# Patient Record
Sex: Female | Born: 1970
Health system: Southern US, Community
[De-identification: ages and names within clinical notes are randomized; demographics above are authoritative.]

## PROBLEM LIST (undated history)

## (undated) ENCOUNTER — Emergency Department (HOSPITAL_COMMUNITY): Admission: EM | Payer: BC Managed Care – PPO | Source: Home / Self Care

## (undated) DIAGNOSIS — D689 Coagulation defect, unspecified: Secondary | ICD-10-CM

## (undated) DIAGNOSIS — F32A Depression, unspecified: Secondary | ICD-10-CM

## (undated) DIAGNOSIS — M25561 Pain in right knee: Secondary | ICD-10-CM

## (undated) DIAGNOSIS — M1990 Unspecified osteoarthritis, unspecified site: Secondary | ICD-10-CM

## (undated) DIAGNOSIS — T4145XA Adverse effect of unspecified anesthetic, initial encounter: Secondary | ICD-10-CM

## (undated) DIAGNOSIS — N946 Dysmenorrhea, unspecified: Secondary | ICD-10-CM

## (undated) DIAGNOSIS — E041 Nontoxic single thyroid nodule: Secondary | ICD-10-CM

## (undated) DIAGNOSIS — Z8614 Personal history of Methicillin resistant Staphylococcus aureus infection: Secondary | ICD-10-CM

## (undated) DIAGNOSIS — G629 Polyneuropathy, unspecified: Secondary | ICD-10-CM

## (undated) DIAGNOSIS — F329 Major depressive disorder, single episode, unspecified: Secondary | ICD-10-CM

## (undated) DIAGNOSIS — I10 Essential (primary) hypertension: Secondary | ICD-10-CM

## (undated) DIAGNOSIS — Z803 Family history of malignant neoplasm of breast: Secondary | ICD-10-CM

## (undated) DIAGNOSIS — T7840XA Allergy, unspecified, initial encounter: Secondary | ICD-10-CM

## (undated) DIAGNOSIS — Z98811 Dental restoration status: Secondary | ICD-10-CM

## (undated) DIAGNOSIS — M21372 Foot drop, left foot: Secondary | ICD-10-CM

## (undated) DIAGNOSIS — R51 Headache: Secondary | ICD-10-CM

## (undated) DIAGNOSIS — T8859XA Other complications of anesthesia, initial encounter: Secondary | ICD-10-CM

## (undated) DIAGNOSIS — G709 Myoneural disorder, unspecified: Secondary | ICD-10-CM

## (undated) DIAGNOSIS — I82409 Acute embolism and thrombosis of unspecified deep veins of unspecified lower extremity: Secondary | ICD-10-CM

## (undated) DIAGNOSIS — E119 Type 2 diabetes mellitus without complications: Secondary | ICD-10-CM

## (undated) DIAGNOSIS — M2669 Other specified disorders of temporomandibular joint: Secondary | ICD-10-CM

## (undated) DIAGNOSIS — F419 Anxiety disorder, unspecified: Secondary | ICD-10-CM

## (undated) DIAGNOSIS — Z87898 Personal history of other specified conditions: Secondary | ICD-10-CM

## (undated) DIAGNOSIS — D6852 Prothrombin gene mutation: Secondary | ICD-10-CM

## (undated) HISTORY — PX: HAND SURGERY: SHX662

## (undated) HISTORY — DX: Acute embolism and thrombosis of unspecified deep veins of unspecified lower extremity: I82.409

## (undated) HISTORY — DX: Personal history of other specified conditions: Z87.898

## (undated) HISTORY — DX: Essential (primary) hypertension: I10

## (undated) HISTORY — DX: Coagulation defect, unspecified: D68.9

## (undated) HISTORY — PX: FOOT SURGERY: SHX648

## (undated) HISTORY — DX: Foot drop, left foot: M21.372

## (undated) HISTORY — PX: PELVIC LAPAROSCOPY: SHX162

## (undated) HISTORY — DX: Major depressive disorder, single episode, unspecified: F32.9

## (undated) HISTORY — DX: Anxiety disorder, unspecified: F41.9

## (undated) HISTORY — DX: Type 2 diabetes mellitus without complications: E11.9

## (undated) HISTORY — DX: Pain in right knee: M25.561

## (undated) HISTORY — DX: Depression, unspecified: F32.A

## (undated) HISTORY — PX: TONSILLECTOMY: SUR1361

## (undated) HISTORY — DX: Family history of malignant neoplasm of breast: Z80.3

## (undated) HISTORY — PX: JOINT REPLACEMENT: SHX530

## (undated) HISTORY — DX: Polyneuropathy, unspecified: G62.9

## (undated) HISTORY — PX: CHOLECYSTECTOMY: SHX55

## (undated) HISTORY — DX: Prothrombin gene mutation: D68.52

## (undated) HISTORY — DX: Dysmenorrhea, unspecified: N94.6

## (undated) HISTORY — DX: Allergy, unspecified, initial encounter: T78.40XA

---

## 2003-08-13 HISTORY — PX: WRIST SURGERY: SHX841

## 2010-08-12 DIAGNOSIS — I82409 Acute embolism and thrombosis of unspecified deep veins of unspecified lower extremity: Secondary | ICD-10-CM

## 2010-08-12 HISTORY — DX: Acute embolism and thrombosis of unspecified deep veins of unspecified lower extremity: I82.409

## 2012-09-22 ENCOUNTER — Ambulatory Visit (INDEPENDENT_AMBULATORY_CARE_PROVIDER_SITE_OTHER): Payer: BC Managed Care – PPO | Admitting: Family Medicine

## 2012-09-22 VITALS — BP 134/82 | Ht 68.0 in | Wt 280.0 lb

## 2012-09-22 DIAGNOSIS — IMO0002 Reserved for concepts with insufficient information to code with codable children: Secondary | ICD-10-CM

## 2012-09-22 DIAGNOSIS — S83209A Unspecified tear of unspecified meniscus, current injury, unspecified knee, initial encounter: Secondary | ICD-10-CM

## 2012-09-22 MED ORDER — MELOXICAM 15 MG PO TABS: 15.0000 mg | ORAL_TABLET | Freq: Every day | ORAL | Status: DC

## 2012-09-22 MED ORDER — TRAMADOL HCL 50 MG PO TABS: 50.0000 mg | ORAL_TABLET | Freq: Three times a day (TID) | ORAL | Status: DC | PRN

## 2012-09-22 NOTE — Assessment & Plan Note (Signed)
Patient after verbal and written consent did have an injection into the right knee today. Patient was prepped with alcohol swabs to sterilize area and then had 4 cc of 0.5% Marcaine as well as 1 cc of 80 mg Depo-Medrol injected into the right knee with a 25-gauge 1-1/2 inch needle. Patient tolerated the procedure very well and had improvement immediately.  Patient given home exercise program to do, warned to stop running at this interim and do more low-impact exercises.  Discussed the importance of icing as well.patient was given prescriptions meloxicam and tramadol. Patient will return again in 3-4 weeks for further evaluation.

## 2012-09-22 NOTE — Patient Instructions (Addendum)
Very nice to meet you. I do think you have a meniscal tear. And giving you an injection today and I really hope helps. I'm also sending you in anti-inflammatory. It is called meloxicam. Take one pill daily for the next 10 days then as needed thereafter. Only stop this medication if it hurts her stomach. I'm also giving you a medicine called tramadol that you can take at night. I would like you to try these home exercises I am giving you as well. I should see you again in 3-4 weeks.

## 2012-09-23 NOTE — Progress Notes (Signed)
Chief complaint: Right knee pain  History of present illness: Patient is a very pleasant 42 year old female who has started running over the course of last 4 weeks. Patient states that she's been having more medial knee pain on the right side since she started running. Patient fell on the knee about 2 weeks ago and since that time she has been unable to finish her runs. Patient has a goal of running a half marathon later in the year and was to see what can be done about this. Patient has had this problem of right knee pain previously in 2004 which she under did physical therapy and had a good result. That was more of an injury and she feels that this toe is a little bit different. Patient denies any locking clicking or giving out on her or any radiation of the pain.patient states that the pain is worse with running and can be associated with swelling.  PMH: Depression  Meds  Cymbalta  All: Sulfa  SH: no tobacco, occasional ETOH she works for [employer]  Family history is remarkable for diabetes and hypertension.  Physical exam Blood pressure 134/82, height 5\' 8"  (1.727 m), weight 280 lb (127.007 kg). General: No apparent distress alert and oriented x3 mood and affect normal Respiratory: Patient's speak in full sentences and does not appear short of breath Skin: Warm dry intact with no signs of infection or rash Neuro: Cranial nerves II through XII are intact, neurovascularly intact in all extremities with 2+ DTRs and 2+ pulses. Right knee exam: On inspection patient does have trace effusion. Patient does have near full range of motion but does have pain when going greater than 95 of flexion. Patient is severely tender over the medial joint line. Patient's ligaments are intact but she does have a significant positive McMurray's test. Patient unable to do Thessaly at 30 degrees secondary to pain.she is neurovascularly intact with 2+ reflexes.

## 2012-10-13 ENCOUNTER — Ambulatory Visit (INDEPENDENT_AMBULATORY_CARE_PROVIDER_SITE_OTHER): Payer: BC Managed Care – PPO | Admitting: Family Medicine

## 2012-10-13 VITALS — BP 130/80 | Ht 68.0 in | Wt 280.0 lb

## 2012-10-13 DIAGNOSIS — M25569 Pain in unspecified knee: Secondary | ICD-10-CM

## 2012-10-13 DIAGNOSIS — Z5189 Encounter for other specified aftercare: Secondary | ICD-10-CM

## 2012-10-13 DIAGNOSIS — M25561 Pain in right knee: Secondary | ICD-10-CM

## 2012-10-13 DIAGNOSIS — S83206D Unspecified tear of unspecified meniscus, current injury, right knee, subsequent encounter: Secondary | ICD-10-CM

## 2012-10-13 NOTE — Patient Instructions (Signed)
Good to see you You do have a tear of your medial meniscus on the right knee I want you to go to PT for the next 6 weeks.  I think they will help. I also want you to wear the compression sleeve with activity and one hour afterward.  Lets avoid running still for the next 6 weeks (sorry) OK to bike, swimming, or elliptical.  See you again in 6 weeks.

## 2012-10-13 NOTE — Progress Notes (Signed)
Chief complaint: Right knee pain  History of present illness: The patient is a 42 year old female coming back for followup of her right knee pain. Patient was seen previously in the concern for potential meniscal tear patient was given a corticosteroid injection. Patient has also been doing home exercise program and has noted some improvement. Patient is still states that when she is going down stairs or if she tries to run she still has significant amount of pain. Patient is mostly on the medial aspect of the knee and still can be very sharp sensation. Patient denies any swelling or any radiation. Patient does state from time to time it does seem to be unstable. No new symptoms.  Patient still has the goal of running a half marathon later this year.   Physical Exam Blood pressure 130/80, height 5\' 8"  (1.727 m), weight 280 lb (127.007 kg). General: No apparent distress alert and oriented x3 mood and affect normal Right knee exam: On inspection patient does not have any effusion. Patient does have full range of motion. Patient is still very tender over the medial joint line. All ligaments appeared to be intact but she still has a positive McMurray sign. Patient does have a positive Thessaly's. She is neurovascularly intact distally.  Muculoskeletal ultrasound was performed and interpreted by me today.the patient does have hypotrophic changes and what appears to be a tear of her medial meniscus at the mid substance. This to have significant neovascularization surrounding the area. There is hypoechoic changes surrounding the toe making sure that there is still some fluid. Patient super to pick pouch though does not have significant effusion. All the ligaments appear to be intact without any specific changes.

## 2012-10-13 NOTE — Assessment & Plan Note (Signed)
On ultrasound today patient does have a tear of her medial meniscus. Patient will start formal physical therapy and continue her meloxicam. We will see as long as patient continues to improve slowly with continue with this conservative approach. Patient was given a compression sleeve today which did seem to help. Patient will continue these interventions and then come back and see [place] again in 4-6 weeks' time for further evaluation. She continues to have trouble at that time we need to consider more aggressive therapy.

## 2012-10-19 ENCOUNTER — Ambulatory Visit: Payer: BC Managed Care – PPO | Attending: Sports Medicine

## 2012-10-19 DIAGNOSIS — IMO0001 Reserved for inherently not codable concepts without codable children: Secondary | ICD-10-CM | POA: Insufficient documentation

## 2012-10-19 DIAGNOSIS — M6281 Muscle weakness (generalized): Secondary | ICD-10-CM | POA: Insufficient documentation

## 2012-10-19 DIAGNOSIS — R5381 Other malaise: Secondary | ICD-10-CM | POA: Insufficient documentation

## 2012-10-27 ENCOUNTER — Ambulatory Visit: Payer: BC Managed Care – PPO | Admitting: Physical Therapy

## 2012-11-02 ENCOUNTER — Ambulatory Visit: Payer: BC Managed Care – PPO

## 2012-11-05 ENCOUNTER — Encounter: Payer: BC Managed Care – PPO | Admitting: Physical Therapy

## 2012-11-24 ENCOUNTER — Ambulatory Visit (INDEPENDENT_AMBULATORY_CARE_PROVIDER_SITE_OTHER): Payer: BC Managed Care – PPO | Admitting: Family Medicine

## 2012-11-24 VITALS — BP 136/85 | Ht 68.0 in | Wt 280.0 lb

## 2012-11-24 DIAGNOSIS — Z5189 Encounter for other specified aftercare: Secondary | ICD-10-CM

## 2012-11-24 DIAGNOSIS — M25561 Pain in right knee: Secondary | ICD-10-CM

## 2012-11-24 DIAGNOSIS — S83206D Unspecified tear of unspecified meniscus, current injury, right knee, subsequent encounter: Secondary | ICD-10-CM

## 2012-11-24 DIAGNOSIS — M25569 Pain in unspecified knee: Secondary | ICD-10-CM

## 2012-11-24 NOTE — Assessment & Plan Note (Signed)
At this point patient has not made significant amount of improvement. Patient had been given tramadol before and told her to start taking this again at night. Encourage patient that she can take ibuprofen 600 mg 3 times a day for pain relief. Encourage her to continue to wear the compression sleeve. Patient will be scheduled for an MRI for further evaluation of the meniscal tear. We'll call her with the results and discuss what treatment options are available to her then.

## 2012-11-24 NOTE — Progress Notes (Signed)
Chief complaint: Right knee pain  History of present illness: The patient is here for followup of her right knee pain. Patient has been seen previously and did have a musculoskeletal ultrasound performed that did show that she had a medial meniscal tear. Patient has had a corticosteroid injection greater than 2 months ago did seem to relieve the pain for approximately 1 month. Unfortunately since that time the pain seems to have come back. Patient states that this pain is now waking her up at night. Patient states that is on the medial aspect of the knee and is described as a very sharp sensation with certain movements. Patient denies any swelling, any numbness or any radiation of pain down the leg. Patient does unfortunately unable to do any of her exercises and is affecting all her activities of daily living. At this point she does not even walk the dog. Patient has been using a compression brace that we gave her which does help minimally.  14 system review was done and unremarkable as relates to the orthopedic problem.  Physical exam Blood pressure 136/85, height 5\' 8"  (1.727 m), weight 280 lb (127.007 kg). General: No apparent distress alert and oriented x3 mood and affect normal Respiratory: Patient's speak in full sentences and does not appear short of breath Skin: Warm dry intact with no signs of infection or rash Neuro: Cranial nerves II through XII are intact, neurovascularly intact in all extremities with 2+ DTRs and 2+ pulses. Right knee exam: On inspection there is no gross effusion. Patient does have crepitus with full range of motion. Patient's range of motion is full. She is still severely tender to palpation over the medial joint line. All ligaments are intact. Patient does have a positive McMurray sign. Patient does have a positive Thessaly sign. She is neurovascularly intact distally.

## 2012-11-27 ENCOUNTER — Ambulatory Visit
Admission: RE | Admit: 2012-11-27 | Discharge: 2012-11-27 | Disposition: A | Discharge status: Home / Self Care | Payer: BC Managed Care – PPO | Source: Ambulatory Visit | Attending: Sports Medicine | Admitting: Sports Medicine

## 2012-11-27 DIAGNOSIS — S83206D Unspecified tear of unspecified meniscus, current injury, right knee, subsequent encounter: Secondary | ICD-10-CM

## 2012-11-27 DIAGNOSIS — M25561 Pain in right knee: Secondary | ICD-10-CM

## 2012-12-01 ENCOUNTER — Telehealth: Payer: Self-pay | Admitting: Family Medicine

## 2012-12-01 NOTE — Telephone Encounter (Signed)
Called patient and told her that the MRI does show significant chondromalacia. Patient will make an appointment with an orthopedic surgeon to discuss treatment options including viscous supplementation or arthroscopic procedure. All questions were answered.

## 2012-12-16 ENCOUNTER — Other Ambulatory Visit: Payer: Self-pay | Admitting: Orthopaedic Surgery

## 2012-12-24 ENCOUNTER — Encounter (HOSPITAL_BASED_OUTPATIENT_CLINIC_OR_DEPARTMENT_OTHER): Payer: Self-pay | Admitting: *Deleted

## 2012-12-24 NOTE — H&P (Signed)
[name] is an 42 y.o. female.   Chief Complaint: right knee pain HPI: c/o of many weeks of right knee pain. She has pain at rest and ADL'S. Pain is patella femoral and medial joint line. She has tried nsaid's and cortisone injection with no benefit.  Recent MRI scan is positive for CMP and probably a med. Meniscus tear. She would like to move forward with a right knee scope.  Past Medical History  Diagnosis Date  . Headache     tension or sinus  . Arthritis     right knee  . Depression   . Anxiety   . Medial meniscus tear 12/2012    right knee  . History of MRSA infection prior to 2012    lasted 5-6 yr.  . Dental crowns present   . Jaw snapping     states jaw pops  . Insect bites 12/24/2012    leg  . Complication of anesthesia     has been hard to wake up post-op    Past Surgical History  Procedure Laterality Date  . Cholecystectomy    . Hand surgery Left     X 3 - spider bite and MRSA infection  . Wrist surgery Right   . Tonsillectomy      History reviewed. No pertinent family history. Social History:  reports that she has never smoked. She has never used smokeless tobacco. She reports that  drinks alcohol. She reports that she does not use illicit drugs.  Allergies:  Allergies  Allergen Reactions  . Sulfa Antibiotics Other (See Comments)    HALLUCINATIONS, FEVER, CHILLS    No prescriptions prior to admission    No results found for this or any previous visit (from the past 48 hour(s)). No results found.  Review of Systems  Constitutional: Negative.   HENT: Negative.   Eyes: Negative.   Respiratory: Negative.   Cardiovascular: Negative.   Gastrointestinal: Negative.   Genitourinary: Negative.   Musculoskeletal: Positive for joint pain.  Skin: Negative.   Neurological: Negative.   Endo/Heme/Allergies:       Hx of DVT-coumadin  Psychiatric/Behavioral: Negative.     Height 5\' 8"  (1.727 m), weight 127.007 kg (280 lb). Physical Exam   Constitutional: She is oriented to person, place, and time. She appears well-nourished.  HENT:  Head: Atraumatic.  Eyes: Pupils are equal, round, and reactive to light.  Neck: Normal range of motion.  Cardiovascular: Regular rhythm.   Respiratory: Breath sounds normal.  GI: Soft.  Musculoskeletal:  Right knee- 5-105 rom. 1+crepitation. Pain on med joint line and with motion. + altered gait  Neurological: She is oriented to person, place, and time.  Skin: Skin is dry.  Psychiatric: She has a normal mood and affect.     Assessment/Plan A: Right knee CMP and  A probable medial meniscus tear Plan: We will proceed with arthroscopy of the right knee to stop the pain and improve function. The risks of anesthesia, infection and DVT are reviewed with her. She will contact her PCP for management of her coumadin perioperatively . She most likely will need therapy post op to maximize the results.  Amyre Segundo R 12/24/2012, 12:54 PM

## 2012-12-29 ENCOUNTER — Encounter (HOSPITAL_BASED_OUTPATIENT_CLINIC_OR_DEPARTMENT_OTHER): Payer: Self-pay | Admitting: *Deleted

## 2012-12-29 ENCOUNTER — Encounter (HOSPITAL_BASED_OUTPATIENT_CLINIC_OR_DEPARTMENT_OTHER): Payer: Self-pay | Admitting: Anesthesiology

## 2012-12-29 ENCOUNTER — Ambulatory Visit (HOSPITAL_BASED_OUTPATIENT_CLINIC_OR_DEPARTMENT_OTHER): Payer: BC Managed Care – PPO | Admitting: Anesthesiology

## 2012-12-29 ENCOUNTER — Ambulatory Visit (HOSPITAL_BASED_OUTPATIENT_CLINIC_OR_DEPARTMENT_OTHER)
Admission: RE | Admit: 2012-12-29 | Discharge: 2012-12-29 | Disposition: A | Discharge status: Home / Self Care | Payer: BC Managed Care – PPO | Source: Ambulatory Visit | Attending: Orthopaedic Surgery | Admitting: Orthopaedic Surgery

## 2012-12-29 ENCOUNTER — Encounter (HOSPITAL_BASED_OUTPATIENT_CLINIC_OR_DEPARTMENT_OTHER)
Admission: RE | Disposition: A | Discharge status: Home / Self Care | Payer: Self-pay | Source: Ambulatory Visit | Attending: Orthopaedic Surgery

## 2012-12-29 DIAGNOSIS — F411 Generalized anxiety disorder: Secondary | ICD-10-CM | POA: Insufficient documentation

## 2012-12-29 DIAGNOSIS — F329 Major depressive disorder, single episode, unspecified: Secondary | ICD-10-CM | POA: Insufficient documentation

## 2012-12-29 DIAGNOSIS — Z86718 Personal history of other venous thrombosis and embolism: Secondary | ICD-10-CM | POA: Insufficient documentation

## 2012-12-29 DIAGNOSIS — Z7901 Long term (current) use of anticoagulants: Secondary | ICD-10-CM | POA: Insufficient documentation

## 2012-12-29 DIAGNOSIS — IMO0002 Reserved for concepts with insufficient information to code with codable children: Secondary | ICD-10-CM | POA: Insufficient documentation

## 2012-12-29 DIAGNOSIS — Z8614 Personal history of Methicillin resistant Staphylococcus aureus infection: Secondary | ICD-10-CM | POA: Insufficient documentation

## 2012-12-29 DIAGNOSIS — M129 Arthropathy, unspecified: Secondary | ICD-10-CM | POA: Insufficient documentation

## 2012-12-29 DIAGNOSIS — Z882 Allergy status to sulfonamides status: Secondary | ICD-10-CM | POA: Insufficient documentation

## 2012-12-29 DIAGNOSIS — M2240 Chondromalacia patellae, unspecified knee: Secondary | ICD-10-CM | POA: Insufficient documentation

## 2012-12-29 DIAGNOSIS — S83206D Unspecified tear of unspecified meniscus, current injury, right knee, subsequent encounter: Secondary | ICD-10-CM

## 2012-12-29 DIAGNOSIS — X58XXXA Exposure to other specified factors, initial encounter: Secondary | ICD-10-CM | POA: Insufficient documentation

## 2012-12-29 DIAGNOSIS — F3289 Other specified depressive episodes: Secondary | ICD-10-CM | POA: Insufficient documentation

## 2012-12-29 HISTORY — DX: Dental restoration status: Z98.811

## 2012-12-29 HISTORY — DX: Other specified disorders of temporomandibular joint: M26.69

## 2012-12-29 HISTORY — DX: Other complications of anesthesia, initial encounter: T88.59XA

## 2012-12-29 HISTORY — DX: Headache: R51

## 2012-12-29 HISTORY — DX: Personal history of Methicillin resistant Staphylococcus aureus infection: Z86.14

## 2012-12-29 HISTORY — DX: Depression, unspecified: F32.A

## 2012-12-29 HISTORY — DX: Major depressive disorder, single episode, unspecified: F32.9

## 2012-12-29 HISTORY — DX: Adverse effect of unspecified anesthetic, initial encounter: T41.45XA

## 2012-12-29 HISTORY — DX: Unspecified osteoarthritis, unspecified site: M19.90

## 2012-12-29 HISTORY — PX: KNEE ARTHROSCOPY: SHX127

## 2012-12-29 SURGERY — ARTHROSCOPY, KNEE
Anesthesia: General | Site: Knee | Laterality: Right | Wound class: Clean

## 2012-12-29 MED ORDER — OXYCODONE HCL 5 MG/5ML PO SOLN: 5.0000 mg | Freq: Once | ORAL | Status: AC | PRN

## 2012-12-29 MED ORDER — OXYCODONE HCL 5 MG PO TABS
5.0000 mg | ORAL_TABLET | Freq: Once | ORAL | Status: AC | PRN
  Administered 2012-12-29: 5 mg via ORAL

## 2012-12-29 MED ORDER — CEFAZOLIN SODIUM-DEXTROSE 2-3 GM-% IV SOLR: 2.0000 g | INTRAVENOUS | Status: DC

## 2012-12-29 MED ORDER — LACTATED RINGERS IV SOLN: INTRAVENOUS | Status: DC

## 2012-12-29 MED ORDER — ONDANSETRON HCL 4 MG/2ML IJ SOLN
INTRAMUSCULAR | Status: DC | PRN
  Administered 2012-12-29: 4 mg via INTRAVENOUS

## 2012-12-29 MED ORDER — KETOROLAC TROMETHAMINE 30 MG/ML IJ SOLN
30.0000 mg | Freq: Once | INTRAMUSCULAR | Status: AC
  Administered 2012-12-29: 30 mg via INTRAVENOUS

## 2012-12-29 MED ORDER — MIDAZOLAM HCL 2 MG/2ML IJ SOLN: 1.0000 mg | INTRAMUSCULAR | Status: DC | PRN

## 2012-12-29 MED ORDER — FENTANYL CITRATE 0.05 MG/ML IJ SOLN
INTRAMUSCULAR | Status: DC | PRN
  Administered 2012-12-29 (×2): 25 ug via INTRAVENOUS
  Administered 2012-12-29: 50 ug via INTRAVENOUS
  Administered 2012-12-29: 25 ug via INTRAVENOUS

## 2012-12-29 MED ORDER — SODIUM CHLORIDE 0.9 % IR SOLN
Status: DC | PRN
  Administered 2012-12-29: 6000 mL

## 2012-12-29 MED ORDER — MIDAZOLAM HCL 2 MG/ML PO SYRP: 12.0000 mg | ORAL_SOLUTION | Freq: Once | ORAL | Status: DC | PRN

## 2012-12-29 MED ORDER — DEXAMETHASONE SODIUM PHOSPHATE 4 MG/ML IJ SOLN
INTRAMUSCULAR | Status: DC | PRN
  Administered 2012-12-29: 10 mg via INTRAVENOUS

## 2012-12-29 MED ORDER — HYDROCODONE-ACETAMINOPHEN 5-325 MG PO TABS: 1.0000 | ORAL_TABLET | Freq: Four times a day (QID) | ORAL | Status: DC | PRN

## 2012-12-29 MED ORDER — BUPIVACAINE-EPINEPHRINE 0.5% -1:200000 IJ SOLN
INTRAMUSCULAR | Status: DC | PRN
  Administered 2012-12-29: 20 mL

## 2012-12-29 MED ORDER — DEXTROSE 5 % IV SOLN
3.0000 g | INTRAVENOUS | Status: AC
  Administered 2012-12-29: 2 g via INTRAVENOUS

## 2012-12-29 MED ORDER — CHLORHEXIDINE GLUCONATE 4 % EX LIQD: 60.0000 mL | Freq: Once | CUTANEOUS | Status: DC

## 2012-12-29 MED ORDER — HYDROMORPHONE HCL PF 1 MG/ML IJ SOLN
0.2500 mg | INTRAMUSCULAR | Status: DC | PRN
  Administered 2012-12-29 (×4): 0.5 mg via INTRAVENOUS

## 2012-12-29 MED ORDER — ONDANSETRON HCL 4 MG/2ML IJ SOLN: 4.0000 mg | Freq: Once | INTRAMUSCULAR | Status: DC | PRN

## 2012-12-29 MED ORDER — MIDAZOLAM HCL 5 MG/5ML IJ SOLN
INTRAMUSCULAR | Status: DC | PRN
  Administered 2012-12-29: 2 mg via INTRAVENOUS

## 2012-12-29 MED ORDER — HYDROMORPHONE HCL PF 1 MG/ML IJ SOLN
0.5000 mg | INTRAMUSCULAR | Status: DC | PRN
  Administered 2012-12-29: 0.5 mg via INTRAVENOUS

## 2012-12-29 MED ORDER — PROPOFOL 10 MG/ML IV BOLUS
INTRAVENOUS | Status: DC | PRN
  Administered 2012-12-29: 300 mg via INTRAVENOUS

## 2012-12-29 MED ORDER — LACTATED RINGERS IV SOLN
INTRAVENOUS | Status: DC
  Administered 2012-12-29 (×2): via INTRAVENOUS

## 2012-12-29 MED ORDER — LIDOCAINE HCL (CARDIAC) 20 MG/ML IV SOLN
INTRAVENOUS | Status: DC | PRN
  Administered 2012-12-29: 75 mg via INTRAVENOUS

## 2012-12-29 MED ORDER — FENTANYL CITRATE 0.05 MG/ML IJ SOLN: 50.0000 ug | INTRAMUSCULAR | Status: DC | PRN

## 2012-12-29 SURGICAL SUPPLY — 40 items
BANDAGE ELASTIC 6 VELCRO ST LF (GAUZE/BANDAGES/DRESSINGS) ×2 IMPLANT
BANDAGE GAUZE ELAST BULKY 4 IN (GAUZE/BANDAGES/DRESSINGS) ×2 IMPLANT
BLADE CUDA 5.5 (BLADE) IMPLANT
BLADE GREAT WHITE 4.2 (BLADE) ×2 IMPLANT
CANISTER OMNI JUG 16 LITER (MISCELLANEOUS) ×2 IMPLANT
CANISTER SUCTION 2500CC (MISCELLANEOUS) IMPLANT
DRAPE ARTHROSCOPY W/POUCH 114 (DRAPES) ×2 IMPLANT
DRAPE U-SHAPE 47X51 STRL (DRAPES) ×2 IMPLANT
DRSG EMULSION OIL 3X3 NADH (GAUZE/BANDAGES/DRESSINGS) ×2 IMPLANT
DURAPREP 26ML APPLICATOR (WOUND CARE) ×2 IMPLANT
ELECT MENISCUS 165MM 90D (ELECTRODE) IMPLANT
ELECT REM PT RETURN 9FT ADLT (ELECTROSURGICAL)
ELECTRODE REM PT RTRN 9FT ADLT (ELECTROSURGICAL) IMPLANT
GLOVE BIO SURGEON STRL SZ 6.5 (GLOVE) ×2 IMPLANT
GLOVE BIO SURGEON STRL SZ8.5 (GLOVE) ×2 IMPLANT
GLOVE BIOGEL PI IND STRL 7.0 (GLOVE) ×1 IMPLANT
GLOVE BIOGEL PI IND STRL 8 (GLOVE) ×1 IMPLANT
GLOVE BIOGEL PI IND STRL 8.5 (GLOVE) ×1 IMPLANT
GLOVE BIOGEL PI INDICATOR 7.0 (GLOVE) ×1
GLOVE BIOGEL PI INDICATOR 8 (GLOVE) ×1
GLOVE BIOGEL PI INDICATOR 8.5 (GLOVE) ×1
GLOVE SS BIOGEL STRL SZ 8 (GLOVE) ×1 IMPLANT
GLOVE SUPERSENSE BIOGEL SZ 8 (GLOVE) ×1
GOWN PREVENTION PLUS XLARGE (GOWN DISPOSABLE) ×2 IMPLANT
GOWN PREVENTION PLUS XXLARGE (GOWN DISPOSABLE) ×4 IMPLANT
IV NS IRRIG 3000ML ARTHROMATIC (IV SOLUTION) ×4 IMPLANT
KNEE WRAP E Z 3 GEL PACK (MISCELLANEOUS) ×2 IMPLANT
PACK ARTHROSCOPY DSU (CUSTOM PROCEDURE TRAY) ×2 IMPLANT
PACK BASIN DAY SURGERY FS (CUSTOM PROCEDURE TRAY) ×2 IMPLANT
PENCIL BUTTON HOLSTER BLD 10FT (ELECTRODE) IMPLANT
SET ARTHROSCOPY TUBING (MISCELLANEOUS) ×1
SET ARTHROSCOPY TUBING LN (MISCELLANEOUS) ×1 IMPLANT
SHEET MEDIUM DRAPE 40X70 STRL (DRAPES) ×2 IMPLANT
SPONGE GAUZE 4X4 12PLY (GAUZE/BANDAGES/DRESSINGS) ×2 IMPLANT
SYR 3ML 18GX1 1/2 (SYRINGE) IMPLANT
TOWEL OR 17X24 6PK STRL BLUE (TOWEL DISPOSABLE) ×2 IMPLANT
TOWEL OR NON WOVEN STRL DISP B (DISPOSABLE) ×2 IMPLANT
WAND 30 DEG SABER W/CORD (SURGICAL WAND) IMPLANT
WAND STAR VAC 90 (SURGICAL WAND) IMPLANT
WATER STERILE IRR 1000ML POUR (IV SOLUTION) ×2 IMPLANT

## 2012-12-29 NOTE — Anesthesia Postprocedure Evaluation (Signed)
  Anesthesia Post-op Note  Patient: [name]  Procedure(s) Performed: Procedure(s): RIGHT KNEE ARTHROSCOPY  PARTIAL MEDIAL AND LATERAL MENISECTOMY WITH CHONDROPLASTY (Right)  Patient Location: PACU  Anesthesia Type:General  Level of Consciousness: awake, alert  and oriented  Airway and Oxygen Therapy: Patient Spontanous Breathing and Patient connected to face mask oxygen  Post-op Pain: moderate  Post-op Assessment: Post-op Vital signs reviewed  Post-op Vital Signs: Reviewed  Complications: No apparent anesthesia complications

## 2012-12-29 NOTE — Op Note (Signed)
#  [id number] 

## 2012-12-29 NOTE — Interval H&P Note (Signed)
History and Physical Interval Note:  12/29/2012 10:50 AM  [name]  has presented today for surgery, with the diagnosis of Right Knee Medial Mensical Tear  The various methods of treatment have been discussed with the patient and family. After consideration of risks, benefits and other options for treatment, the patient has consented to  Procedure(s): RIGHT KNEE ARTHROSCOPY  (Right) as a surgical intervention .  The patient's history has been reviewed, patient examined, no change in status, stable for surgery.  I have reviewed the patient's chart and labs.  Questions were answered to the patient's satisfaction.     Treasa Bradshaw G

## 2012-12-29 NOTE — Anesthesia Procedure Notes (Signed)
Procedure Name: LMA Insertion Date/Time: 12/29/2012 11:16 AM Performed by: Zenia Resides D Pre-anesthesia Checklist: Patient identified, Emergency Drugs available, Suction available and Patient being monitored Patient Re-evaluated:Patient Re-evaluated prior to inductionOxygen Delivery Method: Circle System Utilized Preoxygenation: Pre-oxygenation with 100% oxygen Intubation Type: IV induction Ventilation: Mask ventilation without difficulty LMA: LMA inserted LMA Size: 4.0 Number of attempts: 1 Airway Equipment and Method: bite block Placement Confirmation: positive ETCO2 Tube secured with: Tape Dental Injury: Teeth and Oropharynx as per pre-operative assessment

## 2012-12-29 NOTE — Anesthesia Preprocedure Evaluation (Signed)
Anesthesia Evaluation  Patient identified by MRN, date of birth, ID band Patient awake    Reviewed: Allergy & Precautions, H&P , NPO status , Patient's Chart, lab work & pertinent test results  Airway Mallampati: I TM Distance: >3 FB Neck ROM: Full    Dental  (+) Teeth Intact and Dental Advisory Given   Pulmonary  breath sounds clear to auscultation        Cardiovascular Rhythm:Regular Rate:Normal     Neuro/Psych    GI/Hepatic   Endo/Other  Morbid obesity  Renal/GU      Musculoskeletal   Abdominal   Peds  Hematology   Anesthesia Other Findings   Reproductive/Obstetrics                           Anesthesia Physical Anesthesia Plan  ASA: II  Anesthesia Plan: General   Post-op Pain Management:    Induction: Intravenous  Airway Management Planned:   Additional Equipment:   Intra-op Plan:   Post-operative Plan: Extubation in OR  Informed Consent: I have reviewed the patients History and Physical, chart, labs and discussed the procedure including the risks, benefits and alternatives for the proposed anesthesia with the patient or authorized representative who has indicated his/her understanding and acceptance.   Dental advisory given  Plan Discussed with: CRNA, Anesthesiologist and Surgeon  Anesthesia Plan Comments:         Anesthesia Quick Evaluation

## 2012-12-29 NOTE — Transfer of Care (Signed)
Immediate Anesthesia Transfer of Care Note  Patient: [name]  Procedure(s) Performed: Procedure(s): RIGHT KNEE ARTHROSCOPY  PARTIAL MEDIAL AND LATERAL MENISECTOMY WITH CHONDROPLASTY (Right)  Patient Location: PACU  Anesthesia Type:General  Level of Consciousness: awake, alert  and oriented  Airway & Oxygen Therapy: Patient Spontanous Breathing and Patient connected to face mask oxygen  Post-op Assessment: Report given to PACU RN and Post -op Vital signs reviewed and stable  Post vital signs: Reviewed and stable  Complications: No apparent anesthesia complications

## 2012-12-30 ENCOUNTER — Encounter (HOSPITAL_BASED_OUTPATIENT_CLINIC_OR_DEPARTMENT_OTHER): Payer: Self-pay | Admitting: Orthopaedic Surgery

## 2012-12-30 NOTE — Progress Notes (Signed)
Encouraged pt to call Dr. Nolon Nations office if requires assistance with pain management.

## 2012-12-30 NOTE — Op Note (Signed)
NAMELICET, DUNPHY NO.:  [id number]  MEDICAL RECORD NO.:  [id number]  LOCATION:                                 FACILITY:  PHYSICIAN:  Lubertha Basque. Latresha Yahr, M.D.DATE OF BIRTH:  Sep 12, 1970  DATE OF PROCEDURE:  12/29/2012 DATE OF DISCHARGE:                              OPERATIVE REPORT   PREOPERATIVE DIAGNOSES: 1. Right knee torn medial meniscus. 2. Right knee chondromalacia of patella.  POSTOPERATIVE DIAGNOSES: 1. Right knee torn medial and torn lateral meniscus. 2. Right knee chondromalacia of patella.  PROCEDURES: 1. Right knee partial medial and partial lateral meniscectomy. 2. Right knee abrasion chondroplasty, patellofemoral.  ANESTHESIA:  General.  ATTENDING SURGEON:  Lubertha Basque. Jerl Santos, M.D.  ASSISTANT:  Lindwood Qua, P.A.  INDICATION FOR PROCEDURE:  The patient is a 42 year old woman with a long history of right knee pain and swelling.  This is persisted despite multiple conservative measures.  She has pain which limits her ability to rest and walk and she is offered an arthroscopy.  Informed operative consent was obtained after discussion of possible complications including reaction to anesthesia and infection.  SUMMARY OF FINDINGS AND PROCEDURE:  Under general anesthesia, an arthroscopy of the right knee was performed.  Suprapatellar pouch was benign while the patellofemoral joint exhibited grade 3 and focal grade 4 change involving both the apex of the patella and the intertrochlear groove.  Thorough chondroplasties and abrasion to bleeding bone were done.  The medial compartment had a small posterior horn medial meniscus tear addressed with about a 5% partial medial meniscectomy.  She also had some grade 3 change on a broad surface of the medial femoral condyle, and a thorough chondroplasty was done.  The ACL looked intact. The lateral compartment had a posterior horn with lateral meniscus tear, addressed with a 5% partial lateral  meniscectomy.  There were no degenerative changes in the lateral compartment.  She was discharged home the same day.  DESCRIPTION OF PROCEDURE:  The patient was taken to the operating suite where general anesthetic was applied without difficulty.  She was positioned supine and prepped and draped in normal sterile fashion. After the administration of IV Kefzol and appropriate time-out, an arthroscopy of the right knee was performed through total of two portals.  Findings were as noted above and procedure consisted of the partial medial and partial lateral meniscectomies done with baskets and shavers followed by the abrasion chondroplasty, patellofemoral.  The knee was thoroughly irrigated followed by placement of Marcaine with epinephrine and morphine.  Adaptic was placed over the portals followed by dry gauze and loose Ace wrap.  Estimated blood loss and intraoperative fluids obtained from anesthesia records as can accurate.  Plans were for her to go home same-day and follow up in the office next week.  I will contact her by phone tonight.     Lubertha Basque Jerl Santos, M.D.     PGD/MEDQ  D:  12/29/2012  T:  12/30/2012  Job:  [id number]

## 2013-03-26 ENCOUNTER — Other Ambulatory Visit: Payer: Self-pay | Admitting: Family Medicine

## 2013-04-09 ENCOUNTER — Encounter: Payer: Self-pay | Admitting: Family Medicine

## 2013-04-09 ENCOUNTER — Ambulatory Visit (INDEPENDENT_AMBULATORY_CARE_PROVIDER_SITE_OTHER): Payer: BC Managed Care – PPO | Admitting: Family Medicine

## 2013-04-09 VITALS — BP 122/82 | Temp 98.3°F | Ht 67.5 in | Wt 292.0 lb

## 2013-04-09 DIAGNOSIS — F329 Major depressive disorder, single episode, unspecified: Secondary | ICD-10-CM

## 2013-04-09 DIAGNOSIS — J329 Chronic sinusitis, unspecified: Secondary | ICD-10-CM

## 2013-04-09 DIAGNOSIS — G4700 Insomnia, unspecified: Secondary | ICD-10-CM

## 2013-04-09 DIAGNOSIS — E119 Type 2 diabetes mellitus without complications: Secondary | ICD-10-CM

## 2013-04-09 DIAGNOSIS — F341 Dysthymic disorder: Secondary | ICD-10-CM

## 2013-04-09 DIAGNOSIS — Z23 Encounter for immunization: Secondary | ICD-10-CM

## 2013-04-09 DIAGNOSIS — Z975 Presence of (intrauterine) contraceptive device: Secondary | ICD-10-CM

## 2013-04-09 MED ORDER — AMOXICILLIN 875 MG PO TABS: 875.0000 mg | ORAL_TABLET | Freq: Two times a day (BID) | ORAL | Status: DC

## 2013-04-09 NOTE — Progress Notes (Signed)
Chief Complaint  Patient presents with  . Establish Care  . Sinusitis    HPI:  [name] is here to establish care. Insurance changed recently.  Last PCP and physical: 2 years ago with normal pap and mammo.  Has the following chronic problems and concerns today:  Sinusitis: -started: 2 weeks ago -symptoms: nasal congestion, thick, ear pain and pressure, sinus and tooth pain maxillary last few days, drainage, cough -denies: fevers, NVD, SOB, wheezing -has tried: musinex -hx of: sinusitis - remote -has mirena  Depression/Anxiety: -followed by Dr. Sharl Ma whom is prescribing -she is going to try to taper off meds soon -had a tough year last year with daughter undergoing heart surgery/and lost job  Hx of mild diabetes: -after pregnancy, was on medication in the past, then diet control  Patient Active Problem List   Diagnosis Date Noted  . Anxiety and depression, sees Dr. Evelene Croon whom prescripes her psych and sleep medications 04/09/2013  . Insomnia 04/09/2013  . Diabetes 04/09/2013  . IUD contraception - inserted summer 2013 04/09/2013  . Acute meniscal tear of knee 09/22/2012   Health Maintenance: -last pap? -wants flu and tdap vaccines today -last mammo?  ROS: See pertinent positives and negatives per HPI.  Past Medical History  Diagnosis Date  . Headache(784.0)     tension or sinus  . Arthritis     right knee  . Depression   . Anxiety   . Medial meniscus tear 12/2012    right knee  . History of MRSA infection prior to 2012    lasted 5-6 yr.  . Dental crowns present   . Jaw snapping     states jaw pops  . Insect bites 12/24/2012    leg  . Complication of anesthesia     has been hard to wake up post-op  . Gestational diabetes     Family History  Problem Relation Age of Onset  . Diabetes Mother   . Kidney disease Mother   . Hypertension Mother   . Cancer Mother     breast cancer  . Cancer Maternal Grandmother     History   Social History  .  Marital Status: Single    Spouse Name: N/A    Number of Children: N/A  . Years of Education: N/A   Social History Main Topics  . Smoking status: Never Smoker   . Smokeless tobacco: Never Used  . Alcohol Use: Yes     Comment: occasionally; 1-2 drinks a few times per year  . Drug Use: No  . Sexual Activity: None   Other Topics Concern  . 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 , Disp: , Rfl: ;  DULoxetine (CYMBALTA) 60 MG capsule, Take 60 mg by mouth daily. , Disp: , Rfl: ;  meloxicam (MOBIC) 15 MG tablet, Take 15 mg by mouth daily. , Disp: , Rfl: ;  zolpidem (AMBIEN) 10 MG tablet, Take 10 mg by mouth at bedtime as needed. , Disp: , Rfl:  amoxicillin (AMOXIL) 875 MG tablet, Take 1 tablet (875 mg total) by mouth 2 (two) times daily., Disp: 20 tablet,  Rfl: 0  EXAM:  Filed Vitals:   04/09/13 1108  BP: 122/82  Temp: 98.3 F (36.8 C)    Body mass index is 45.03 kg/(m^2).  GENERAL: vitals reviewed and listed above, alert, oriented, appears well hydrated and in no acute distress  HEENT: atraumatic, conjunttiva clear, no obvious abnormalities on inspection of external nose and ears, normal appearance of ear canals and TMs, white nasal congestion, mild post oropharyngeal erythema with PND, no tonsillar edema or exudate, R max sinus TTP  NECK: no obvious masses on inspection  LUNGS: clear to auscultation bilaterally, no wheezes, rales or rhonchi, good air movement  CV: HRRR, no peripheral edema  MS: moves all extremities without noticeable abnormality  PSYCH: pleasant and cooperative, no obvious depression or anxiety  ASSESSMENT AND PLAN:  Discussed the following assessment and plan:  Need for  prophylactic vaccination with combined diphtheria-tetanus-pertussis (DTP) vaccine - Plan: Tdap vaccine greater than or equal to 7yo IM  Need for prophylactic vaccination and inoculation against influenza - Plan: Flu Vaccine QUAD 36+ mos PF IM (Fluarix)  Anxiety and depression  Insomnia  Diabetes  IUD contraception - inserted summer 2013  Sinusitis - Plan: amoxicillin (AMOXIL) 875 MG tablet   -We reviewed the PMH, PSH, FH, SH, Meds and Allergies. -We provided refills for any medications we will prescribe as needed. -We addressed current concerns per orders and patient instructions. -We have asked for records for pertinent exams, studies, vaccines and notes from previous providers. -We have advised patient to follow up per instructions below. -she prefers to do fasting labs at CPE in next few weeks - will do pap, hgbA1c, lipids and bmp then -given strong FH of breast Ca advised yearly mammograms, self breast exams and offered referral for genetic counseling. She wishes to hold of on counseling but will let us know if changes her mind regarding this referral -treating sinusitis with amoxicillin, nasal saline and afrin x3 days - risks and return precautions discussed -flu and tdap vacccines given - risks and information provided -psych is to prescribe all of her psych and sleep meds; advised slowly weaning of benzos and sleep aide when possible and discussed risks -she is to follow up in the next few weeks for labs and CPE    -Patient advised to return or notify a doctor immediately if symptoms worsen or persist or new concerns arise.  Patient Instructions  -PLEASE SIGN UP FOR MYCHART TODAY   We recommend the following healthy lifestyle measures: - eat a healthy diet consisting of lots of vegetables, fruits, beans, nuts, seeds, healthy meats such as white chicken and fish and whole grains.  - avoid fried foods, fast food, processed foods, sodas, red meet and other fattening foods.  -  get a least 150 minutes of aerobic exercise per week.   -schedule your mammogram - call number provided for breast center  -As we discussed, we have prescribed a new medication (amoxicillin) for you at this appointment. We discussed the common and serious potential adverse effects of this medication and you can review these and more with the pharmacist when you pick up your medication.  Please follow the instructions for use carefully and notify us immediately if you have any problems taking this medication.  You were given the flu and tdap vaccines today  Follow up in: the next few weeks for a morning appointment for your physical exam, pap smear and fasting labs - you may drink water or black coffee before the appointment  Colin Benton R.

## 2013-04-09 NOTE — Patient Instructions (Signed)
-  PLEASE SIGN UP FOR MYCHART TODAY   We recommend the following healthy lifestyle measures: - eat a healthy diet consisting of lots of vegetables, fruits, beans, nuts, seeds, healthy meats such as white chicken and fish and whole grains.  - avoid fried foods, fast food, processed foods, sodas, red meet and other fattening foods.  - get a least 150 minutes of aerobic exercise per week.   -schedule your mammogram - call number provided for breast center  -As we discussed, we have prescribed a new medication (amoxicillin) for you at this appointment. We discussed the common and serious potential adverse effects of this medication and you can review these and more with the pharmacist when you pick up your medication.  Please follow the instructions for use carefully and notify us immediately if you have any problems taking this medication.  You were given the flu and tdap vaccines today  Follow up in: the next few weeks for a morning appointment for your physical exam, pap smear and fasting labs - you may drink water or black coffee before the appointment

## 2013-04-23 ENCOUNTER — Encounter: Payer: Self-pay | Admitting: Family Medicine

## 2013-04-23 ENCOUNTER — Other Ambulatory Visit (HOSPITAL_COMMUNITY)
Admission: RE | Admit: 2013-04-23 | Discharge: 2013-04-23 | Disposition: A | Discharge status: Home / Self Care | Payer: BC Managed Care – PPO | Source: Ambulatory Visit | Attending: Family Medicine | Admitting: Family Medicine

## 2013-04-23 ENCOUNTER — Ambulatory Visit (INDEPENDENT_AMBULATORY_CARE_PROVIDER_SITE_OTHER): Payer: BC Managed Care – PPO | Admitting: Family Medicine

## 2013-04-23 ENCOUNTER — Encounter: Payer: Self-pay | Admitting: *Deleted

## 2013-04-23 VITALS — BP 120/80 | Temp 98.1°F | Ht 67.5 in | Wt 291.0 lb

## 2013-04-23 DIAGNOSIS — Z0000 Encounter for general adult medical examination without abnormal findings: Secondary | ICD-10-CM

## 2013-04-23 DIAGNOSIS — D229 Melanocytic nevi, unspecified: Secondary | ICD-10-CM

## 2013-04-23 DIAGNOSIS — E669 Obesity, unspecified: Secondary | ICD-10-CM

## 2013-04-23 DIAGNOSIS — D239 Other benign neoplasm of skin, unspecified: Secondary | ICD-10-CM

## 2013-04-23 DIAGNOSIS — Z01419 Encounter for gynecological examination (general) (routine) without abnormal findings: Secondary | ICD-10-CM | POA: Insufficient documentation

## 2013-04-23 LAB — LIPID PANEL
Cholesterol: 174 mg/dL (ref 0–200)
HDL: 41.7 mg/dL (ref >39.00)
Total CHOL/HDL Ratio: 4
Triglycerides: 68 mg/dL (ref 0.0–149.0)

## 2013-04-23 NOTE — Patient Instructions (Addendum)
-  Vitamin D3 1000 IU; 1200mg  calcium daily from diet and supplement  -schedule mammogram  We recommend the following healthy lifestyle measures: - eat a healthy diet consisting of lots of vegetables, fruits, beans, nuts, seeds, healthy meats such as white chicken and fish and whole grains.  - avoid fried foods, fast food, processed foods, sodas, red meet and other fattening foods.  - get a least 150 minutes of aerobic exercise per week.   -We have ordered labs or studies at this visit including biopsy of skin lesion, pap smear and labs. It can take up to 1-2 weeks for results and processing. We will contact you with instructions IF your results are abnormal. Normal results will be released to your [facility]. If you have not heard from [place] or can not find your results in [facility] in 2 weeks please contact our office.

## 2013-04-23 NOTE — Progress Notes (Addendum)
Chief Complaint  Patient presents with  . Annual Exam    HPI:  Here for CPE:  -Concerns today: none  -Diet: variety of foods, balance and well rounded, larger portion sizes  -Taking folic acid, calcium, vit D: no  -Exercise: walking about 5 days per week  -Diabetes and Dyslipidemia Screening: checking today - fasting  -Hx of HTN: no  -Vaccines: UTD  -pap history: 2 years ago she thinks and normal  -FDLMP: none - on mirena  -sexual activity: yes, female partner, no new partners  -wants STI testing: no  -FH breast, colon or ovarian ca: see FH -reports last mammo 2 years ago - she is going to schedule mammogram  -Alcohol, Tobacco, drug use: see social history  Review of Systems - Review of Systems  Constitutional: Negative for fever and weight loss.  HENT: Negative for hearing loss and ear pain.   Eyes: Negative for blurred vision and double vision.  Respiratory: Negative for shortness of breath.   Cardiovascular: Negative for chest pain and leg swelling.  Gastrointestinal: Negative for abdominal pain, blood in stool and melena.  Genitourinary: Negative for dysuria.  Musculoskeletal: Negative for myalgias, joint pain and falls.  Neurological: Negative for dizziness.  Endo/Heme/Allergies: Does not bruise/bleed easily.  Psychiatric/Behavioral: Negative for depression, suicidal ideas and memory loss.     Past Medical History  Diagnosis Date  . Headache(784.0)     tension or sinus  . Arthritis     right knee  . Depression   . Anxiety   . Medial meniscus tear 12/2012    right knee  . History of MRSA infection prior to 2012    lasted 5-6 yr.  . Dental crowns present   . Jaw snapping     states jaw pops  . Insect bites 12/24/2012    leg  . Complication of anesthesia     has been hard to wake up post-op  . Gestational diabetes     Past Surgical History  Procedure Laterality Date  . Cholecystectomy    . Hand surgery Left     X 3 - spider bite and MRSA  infection  . Wrist surgery Right   . Tonsillectomy    . Knee arthroscopy Right 12/29/2012    Procedure: RIGHT KNEE ARTHROSCOPY  PARTIAL MEDIAL AND LATERAL MENISECTOMY WITH CHONDROPLASTY;  Surgeon: Velna Ochs, MD;  Location: [facility];  Service: Orthopedics;  Laterality: Right;    Family History  Problem Relation Age of Onset  . Diabetes Mother   . Kidney disease Mother   . Hypertension Mother   . Cancer Mother     breast cancer  . Cancer Maternal Grandmother     History   Social History  . Marital Status: Single    Spouse Name: N/A    Number of Children: N/A  . Years of Education: N/A   Social History Main Topics  . Smoking status: Never Smoker   . Smokeless tobacco: Never Used  . Alcohol Use: Yes     Comment: occasionally; 1-2 drinks a few times per year  . Drug Use: No  . Sexual Activity: None   Other Topics Concern  . 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 , Disp: , Rfl: ;  DULoxetine (CYMBALTA) 60 MG capsule, Take 60 mg by mouth daily. , Disp: , Rfl: ;  meloxicam (MOBIC) 15 MG tablet, Take 15 mg by mouth daily. , Disp: , Rfl: ;  zolpidem (AMBIEN) 10 MG tablet, Take 10 mg by mouth at bedtime as needed. , Disp: , Rfl:   EXAM:  Filed Vitals:   04/23/13 0822  BP: 120/80  Temp: 98.1 F (36.7 C)    GENERAL: vitals reviewed and listed below, alert, oriented, appears well hydrated and in no acute distress  HEENT: head atraumatic, PERRLA, normal appearance of eyes, ears, nose and mouth. moist mucus membranes.  NECK: supple, no masses or lymphadenopathy  LUNGS: clear to auscultation bilaterally, no rales, rhonchi or wheeze  CV: HRRR, no peripheral edema or cyanosis,  normal pedal pulses  BREAST: normal appearance - no lesions or discharge, on palpation normal breast tissue without any suspicious masses  ABDOMEN: bowel sounds normal, soft, non tender to palpation, no masses, no rebound or guarding  GU: normal appearance of external genitalia - no lesions or masses, normal vaginal mucosa - no abnormal discharge, normal appearance of cervix - no lesions or abnormal discharge, no masses or tenderness on palpation of uterus and ovaries.  RECTAL: refused  SKIN: no rash, several moles on back and one molle in R upper back 8 cm inferior to top of shoulder and 4 cm lateral to spine is darker then other moles, approx 3.10mm in diameter with slightly irr borders  MS: normal gait, moves all extremities normally  NEURO: CN II-XII grossly intact, normal muscle strength and sensation to light touch on extremities  PSYCH: normal affect, pleasant and cooperative  ASSESSMENT AND PLAN:  Discussed the following assessment and plan:  Visit for preventive health examination - Plan: Lipid Panel, Hemoglobin A1c  Obesity, unspecified - Plan: Lipid Panel, Hemoglobin A1c  Nevus Saucerization: Informed consent obtained after explanation of risks/benefits and other options. Time out. Area cleaned with betadine. Local anesthesia with lidocaine 2% with epi. Saucerization removal of lesion. Hemostasis with drysol. Tolerated well. Specimen sent for path. Wound care instructions provided. Will contact pt with path results.  -Discussed and advised all [place] preventive services health task force level A and B recommendations for age, sex and risks.  -FASTING LABS today  -Pap today  -pt to schedule mammogram  -Advised at least 150 minutes of exercise per week and a healthy diet low in saturated fats and sweets and consisting of fresh fruits and vegetables, lean meats such as fish and white chicken and whole grains.  -labs, studies and vaccines per orders this encounter  Orders  Placed This Encounter  Procedures  . Lipid Panel  . Hemoglobin A1c    Patient Instructions  -Vitamin D3 1000 IU; 1200mg  calcium daily from diet and supplement  -schedule mammogram  We recommend the following healthy lifestyle measures: - eat a healthy diet consisting of lots of vegetables, fruits, beans, nuts, seeds, healthy meats such as white chicken and fish and whole grains.  - avoid fried foods, fast food, processed foods, sodas, red meet and other fattening foods.  - get a least 150 minutes of aerobic exercise per week.   -We have ordered labs or studies at this visit. It can take up to 1-2 weeks for results and processing. We will contact you with instructions IF your results are abnormal. Normal results will  be released to your [facility]. If you have not heard from [place] or can not find your results in [facility] in 2 weeks please contact our office.           Patient advised to return to clinic immediately if symptoms worsen or persist or new concerns.    No Follow-up on file.  Cheryl Basque R.

## 2013-04-23 NOTE — Addendum Note (Signed)
Addended by: Kern Reap B on: 04/23/2013 09:33 AM   Modules accepted: Orders

## 2013-04-27 ENCOUNTER — Ambulatory Visit (INDEPENDENT_AMBULATORY_CARE_PROVIDER_SITE_OTHER): Payer: BC Managed Care – PPO | Admitting: Family Medicine

## 2013-04-27 VITALS — BP 110/80 | Temp 98.2°F | Wt 294.0 lb

## 2013-04-27 DIAGNOSIS — Z23 Encounter for immunization: Secondary | ICD-10-CM

## 2013-04-27 DIAGNOSIS — IMO0001 Reserved for inherently not codable concepts without codable children: Secondary | ICD-10-CM

## 2013-04-27 DIAGNOSIS — E669 Obesity, unspecified: Secondary | ICD-10-CM

## 2013-04-27 DIAGNOSIS — D239 Other benign neoplasm of skin, unspecified: Secondary | ICD-10-CM

## 2013-04-27 DIAGNOSIS — E785 Hyperlipidemia, unspecified: Secondary | ICD-10-CM

## 2013-04-27 DIAGNOSIS — E1165 Type 2 diabetes mellitus with hyperglycemia: Secondary | ICD-10-CM

## 2013-04-27 DIAGNOSIS — D235 Other benign neoplasm of skin of trunk: Secondary | ICD-10-CM

## 2013-04-27 DIAGNOSIS — D225 Melanocytic nevi of trunk: Secondary | ICD-10-CM

## 2013-04-27 LAB — BASIC METABOLIC PANEL
BUN: 14 mg/dL (ref 6–23)
Calcium: 8.8 mg/dL (ref 8.4–10.5)
GFR: 83.32 mL/min (ref >60.00)
Glucose, Bld: 203 mg/dL — ABNORMAL HIGH (ref 70–99)

## 2013-04-27 LAB — MICROALBUMIN / CREATININE URINE RATIO: Microalb Creat Ratio: 1.5 mg/g (ref 0.0–30.0)

## 2013-04-27 MED ORDER — PRAVASTATIN SODIUM 20 MG PO TABS: 20.0000 mg | ORAL_TABLET | Freq: Every day | ORAL | Status: DC

## 2013-04-27 NOTE — Patient Instructions (Addendum)
We recommend the following healthy lifestyle measures: - eat a healthy diet consisting of lots of vegetables, fruits, beans, nuts, seeds, healthy meats such as white chicken and fish and whole grains.  - avoid fried foods, fast food, processed foods, sodas, red meet and other fattening foods.  - get a least 150 minutes of aerobic exercise per week.   -We have ordered labs or studies at this visit. It can take up to 1-2 weeks for results and processing. We will contact you with instructions IF your results are abnormal. Normal results will be released to your [facility]. If you have not heard from [place] or can not find your results in [facility] in 2 weeks please contact our office.   -We placed a referral for you as discussed. It usually takes about 1-2 weeks to process and schedule this referral. If you have not heard from [place] regarding this appointment in 2 weeks please contact our office.  -As we discussed, we have prescribed a new medication for you at this appointment. We discussed the common and serious potential adverse effects of this medication and you can review these and more with the pharmacist when you pick up your medication.  Please follow the instructions for use carefully and notify us immediately if you have any problems taking this medication.  -start the following medications:  -pravastatin 20mg  daily -will call you with labs and then plan to start metformin: start 500mg  once daily for 1 week, then increase to 500mg  twice daily for 1 week, then 1000mg  in the morning and 500mg  in the evening for one week, then goal of 1000mg  twice daily -baby aspirin daily  -check fasting blood sugars and keep a log -goal is under 130 fasting -let us know if any low blood sugars under 70 - eat a snack and recheck   -Please schedule an eye doctor appointment  -Follow up in 3 months

## 2013-04-27 NOTE — Addendum Note (Signed)
Addended by: Terressa Koyanagi on: 04/27/2013 02:06 PM   Modules accepted: Orders

## 2013-04-27 NOTE — Progress Notes (Addendum)
Chief Complaint  Patient presents with  . Blood Sugar Problem    HPI:  Ms. [name] is a new patient to our practice whom was found to have DM on screening lab work.  DM, uncontrolled: -had mild diabetes with pregnancy in the past, was diet controlled Lab Results  Component Value Date   HGBA1C 8.2* 04/23/2013  -diet and exercise: no regular exercise; diet so so -home BS: not monitoring, does not have monitor  HLD: -mild on recent labs  ROS: See pertinent positives and negatives per HPI.  Past Medical History  Diagnosis Date  . Headache(784.0)     tension or sinus  . Arthritis     right knee  . Depression   . Anxiety   . Medial meniscus tear 12/2012    right knee  . History of MRSA infection prior to 2012    lasted 5-6 yr.  . Dental crowns present   . Jaw snapping     states jaw pops  . Insect bites 12/24/2012    leg  . Complication of anesthesia     has been hard to wake up post-op  . Gestational diabetes     Past Surgical History  Procedure Laterality Date  . Cholecystectomy    . Hand surgery Left     X 3 - spider bite and MRSA infection  . Wrist surgery Right   . Tonsillectomy    . Knee arthroscopy Right 12/29/2012    Procedure: RIGHT KNEE ARTHROSCOPY  PARTIAL MEDIAL AND LATERAL MENISECTOMY WITH CHONDROPLASTY;  Surgeon: Velna Ochs, MD;  Location: [facility];  Service: Orthopedics;  Laterality: Right;    Family History  Problem Relation Age of Onset  . Diabetes Mother   . Kidney disease Mother   . Hypertension Mother   . Cancer Mother     breast cancer  . Cancer Maternal Grandmother     History   Social History  . Marital Status: Single    Spouse Name: N/A    Number of Children: N/A  . Years of Education: N/A   Social History Main Topics  . Smoking status: Never Smoker   . Smokeless tobacco: Never Used  . Alcohol Use: Yes     Comment: occasionally; 1-2 drinks a few times per year  . Drug Use: No  . Sexual Activity:  Not on file   Other Topics Concern  . 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 , Disp: , Rfl: ;  DULoxetine (CYMBALTA) 60 MG capsule, Take 60 mg by mouth daily. , Disp: , Rfl: ;  meloxicam (MOBIC) 15 MG tablet, Take 15 mg by mouth daily. , Disp: , Rfl: ;  pravastatin (PRAVACHOL) 20 MG tablet, Take 1 tablet (20 mg total) by mouth daily., Disp: 90 tablet, Rfl: 3 zolpidem (AMBIEN) 10 MG tablet, Take 10 mg by mouth at bedtime as needed. , Disp: , Rfl:   EXAM:  Filed Vitals:   04/27/13 1332  BP: 110/80  Temp: 98.2 F (36.8 C)    Body mass index is 45.34 kg/(m^2).  GENERAL: vitals reviewed and  listed above, alert, oriented, appears well hydrated and in no acute distress  HEENT: atraumatic, conjunttiva clear, no obvious abnormalities on inspection of external nose and ears  NECK: no obvious masses on inspection  LUNGS: clear to auscultation bilaterally, no wheezes, rales or rhonchi, good air movement  CV: HRRR, no peripheral edema  MS: moves all extremities without noticeable abnormality  PSYCH: pleasant and cooperative, no obvious depression or anxiety  ASSESSMENT AND PLAN:  Discussed the following assessment and plan:  Diabetes mellitus type 2, uncontrolled - Plan: Ambulatory referral to Nutrition and Diabetic Education, Hemoglobin A1c, Basic metabolic panel, Microalbumin/Creatinine Ratio, Urine  Obesity, BMI unknown  Other and unspecified hyperlipidemia - Plan: pravastatin (PRAVACHOL) 20 MG tablet  Atypical nevus of back - Plan: Ambulatory referral to Dermatology  Dysplastic nevus - Plan: Ambulatory referral to Dermatology  -repeat hgb1c to confirm, micro alb/cr urine BMP to check renal  function - if ok start metformin (discussed risks and titration) -advised extensively on importance of lifestyle changes -advised nutrition/diabetes educator referral -advised yearly eye exam -advised pneumo vaccine - given -advised starting low dose statin for HLD - risks discussed, discussed asa -foot exam done -follow up in 3 months -Patient advised to return or notify a doctor immediately if symptoms worsen or persist or new concerns arise.  Addendum: -derm path results came back as pt leaving with dysplastic nevus - discussed with pt and she would like to see derm. Path report printed and given to pt to take to derm appt and referral placed.  Patient Instructions  We recommend the following healthy lifestyle measures: - eat a healthy diet consisting of lots of vegetables, fruits, beans, nuts, seeds, healthy meats such as white chicken and fish and whole grains.  - avoid fried foods, fast food, processed foods, sodas, red meet and other fattening foods.  - get a least 150 minutes of aerobic exercise per week.   -We have ordered labs or studies at this visit. It can take up to 1-2 weeks for results and processing. We will contact you with instructions IF your results are abnormal. Normal results will be released to your [facility]. If you have not heard from [place] or can not find your results in [facility] in 2 weeks please contact our office.   -We placed a referral for you as discussed. It usually takes about 1-2 weeks to process and schedule this referral. If you have not heard from [place] regarding this appointment in 2 weeks please contact our office.  -As we discussed, we have prescribed a new medication for you at this appointment. We discussed the common and serious potential adverse effects of this medication and you can review these and more with the pharmacist when you pick up your medication.  Please follow the instructions for use carefully and notify us immediately if you have any problems  taking this medication.  -start the following medications:  -pravastatin 20mg  daily -will call you with labs and then plan to start metformin: start 500mg  once daily for 1 week, then increase to 500mg  twice daily for 1 week, then 1000mg  in the morning and 500mg  in the evening for one week, then goal of 1000mg  twice daily -baby aspirin daily  -check fasting blood sugars and keep a log -goal is under 130 fasting -let us know if any low blood sugars under 70 - eat a snack and recheck   -Please schedule an eye doctor appointment  -Follow up in 3 months  Colin Benton R.

## 2013-04-27 NOTE — Addendum Note (Signed)
Addended by: Azucena Freed on: 04/27/2013 03:31 PM   Modules accepted: Orders

## 2013-04-28 ENCOUNTER — Encounter: Payer: Self-pay | Admitting: Family Medicine

## 2013-04-28 MED ORDER — METFORMIN HCL 500 MG PO TABS: ORAL_TABLET | ORAL | Status: DC

## 2013-04-28 NOTE — Addendum Note (Signed)
Addended by: Azucena Freed on: 04/28/2013 03:42 PM   Modules accepted: Orders

## 2013-04-28 NOTE — Progress Notes (Signed)
Quick Note:  Pt received via MyChart. ______

## 2013-04-28 NOTE — Addendum Note (Signed)
Addended by: Azucena Freed on: 04/28/2013 04:55 PM   Modules accepted: Orders

## 2013-04-29 ENCOUNTER — Other Ambulatory Visit: Payer: Self-pay

## 2013-04-29 DIAGNOSIS — Z1231 Encounter for screening mammogram for malignant neoplasm of breast: Secondary | ICD-10-CM

## 2013-04-29 DIAGNOSIS — Z803 Family history of malignant neoplasm of breast: Secondary | ICD-10-CM

## 2013-04-30 ENCOUNTER — Ambulatory Visit (INDEPENDENT_AMBULATORY_CARE_PROVIDER_SITE_OTHER): Payer: BC Managed Care – PPO | Admitting: Family Medicine

## 2013-04-30 ENCOUNTER — Encounter: Payer: Self-pay | Admitting: Family Medicine

## 2013-04-30 VITALS — BP 130/78 | HR 84 | Wt 292.0 lb

## 2013-04-30 DIAGNOSIS — M9420 Chondromalacia, unspecified site: Secondary | ICD-10-CM

## 2013-04-30 NOTE — Progress Notes (Signed)
  Subjective:    CC: Continued right knee pain   HPI: Patient is a very pleasant 42 year old female who I had the pleasure of meeting at a different office previously. Patient did have what appeared to be an acute meniscal tear on ultrasound. Patient did have a followup MRI that showed she had degenerative changes as well as tricompartmental osteoarthritic changes of the knee. Patient's was not diagnosed with a acute meniscal tear per MRI. Patient has been doing conservative therapy and was not seeming to do any better. Patient went and saw Dr. Jerl Santos who in May and performed a medial meniscectomy as well as chondroplasty. Patient states that she did have some improvement after the surgery but unfortunately over the last month she does seem to be worsening and again. Patient complains mostly of anterior knee pain can shoot around her knee. Patient describes it as a dull aching but can be sharp from time to time. Patient states it is much worse with going downstairs. Patient has been trying to be active and doing some the exercises twice a day it is noticed that starting to swell again as well.  Past medical history, Surgical history, Family history not pertinant except as noted below, Social history, Allergies, and medications have been entered into the medical record, reviewed, and no changes needed.   Review of Systems: No fevers, chills, night sweats, weight loss, chest pain, or shortness of breath.   Objective:   Blood pressure 130/78, pulse 84, weight 292 lb (132.45 kg), SpO2 97.00%.  General: Well Developed, well nourished, and in no acute distress. Obese Neuro: Alert and oriented x3, extra-ocular muscles intact, sensation grossly intact.  HEENT: Normocephalic, atraumatic, pupils equal round reactive to light, neck supple, no masses, no lymphadenopathy, thyroid nonpalpable.  Skin: Warm and dry, no rashes. Cardiac:  no lower extremity edema. Respiratory: Not using accessory muscles,  speaking in full sentences. Abdominal: NT, soft Gait: Nonantlagic, good balance and coordination Lymphatic: no lymphadenopathy in neck or axillae on palpation, non tender.  Musculoskeletal: Inspection and palpation of the right and left upper extremities including the shoulders elbows and wrist are unremarkable with full range of motion and good muscle strength and tone. Inspection and palpation of the right and left lower extremities including the hips  and ankles are unremarkable and nontender with full range of motion and good muscle strength and tone and are symmetric. Knee: Right Normal to inspection with no erythema but trace effusion Palpation normal with no warmth,  But positive medial joint line tenderness, patellar tenderness, or condyle tenderness. ROM full in flexion and extension and lower leg rotation. Ligaments with solid consistent endpoints including ACL, PCL, LCL, MCL. Positive Mcmurray's, Apley's, and Thessalonian tests. Painful patellar compression. Patellar glide with significant crepitus. Patellar and quadriceps tendons unremarkable but does have some weakness of the quadriceps on the right side compared to the contralateral side Hamstring strength is normal Contralateral knee unremarkable.  After informed written and verbal consent, patient was seated on exam table. Right knee was prepped with alcohol swab and utilizing anterolateral approach, patient's right knee space was injected with 4:1  marcaine 0.5%: Kenalog 40mg /dL. Patient tolerated the procedure well without immediate complications.  Impression and Recommendations:

## 2013-04-30 NOTE — Assessment & Plan Note (Signed)
Patient did have a medial meniscectomy and since that time to make improvement but unfortunately is having associated symptoms again. There is a questionable re\re tear of the meniscus. Patient though does have moderate to almost severe osteoarthritic changes which makes her likely not a surgical candidate again for repair. We did try an injection today. Discussed other over-the-counter medications that could be helpful for arthritis pain. We also discussed exercises as well as cycling. The patient was given a brace today which was applied and strapped on by me today. Patient will then come back in 4 weeks. If she actually starts to have more trouble she would be a candidate for viscous supplementation.

## 2013-04-30 NOTE — Patient Instructions (Signed)
It always good to see you Try the injection and the brace.  I think they will help We will see you back in 4 weeks but call if you want [place] to do synvisc or supartz. I will call you about the bike fitting.   Take tylenol 650 mg three times a day is the best evidence based medicine we have for arthritis.  Aleve 1-2 tabs twice a day with food or ibuprofen 600mg  twice daily with food can be added to tylenol.  Glucosamine sulfate 750mg  twice a day is a supplement that has been shown to help moderate to severe arthritis. Capsaicin topically up to four times a day may also help with pain. If cortisone injections do not help, there are different types of shots that may help but they take longer to take effect.  We can discuss this at follow up.  It's important that you continue to stay active. Controlling your weight is important.  Consider physical therapy to strengthen muscles around the joint that hurts to take pressure off of the joint itself. Shoe inserts with good arch support may be helpful. Heat or ice 20 minutes at a time 3-4 times a day as needed to help with pain. Water aerobics and cycling with low resistance are the best two types of exercise for arthritis.

## 2013-05-20 ENCOUNTER — Ambulatory Visit
Admission: RE | Admit: 2013-05-20 | Discharge: 2013-05-20 | Disposition: A | Discharge status: Home / Self Care | Payer: BC Managed Care – PPO | Source: Ambulatory Visit

## 2013-05-20 DIAGNOSIS — Z1231 Encounter for screening mammogram for malignant neoplasm of breast: Secondary | ICD-10-CM

## 2013-05-20 DIAGNOSIS — Z803 Family history of malignant neoplasm of breast: Secondary | ICD-10-CM

## 2013-05-24 ENCOUNTER — Encounter: Payer: Self-pay | Admitting: *Deleted

## 2013-05-24 ENCOUNTER — Encounter: Payer: BC Managed Care – PPO | Attending: Family Medicine | Admitting: *Deleted

## 2013-05-24 ENCOUNTER — Encounter: Payer: Self-pay | Admitting: Family Medicine

## 2013-05-24 ENCOUNTER — Other Ambulatory Visit: Payer: Self-pay | Admitting: Family Medicine

## 2013-05-24 VITALS — Ht 68.0 in | Wt 291.5 lb

## 2013-05-24 DIAGNOSIS — Z713 Dietary counseling and surveillance: Secondary | ICD-10-CM | POA: Insufficient documentation

## 2013-05-24 DIAGNOSIS — E119 Type 2 diabetes mellitus without complications: Secondary | ICD-10-CM | POA: Insufficient documentation

## 2013-05-24 DIAGNOSIS — N6452 Nipple discharge: Secondary | ICD-10-CM

## 2013-05-24 NOTE — Patient Instructions (Addendum)
Goals  Try using a recipe builder for foods you make at home. (ex: [employer])  No meal skipping - try Premier Protein shake to start  Sign up for and attend Bariatric Education Seminar  Call insurance and see if Supervised Weight Loss needed   Bring food journal and glucose meter to next appt  May try biotin to help with hair loss   Diabetes Self-Care Goals  Feet  Daily inspection of feet (look for red/whit spots, signs of infection, blisters, dryness)  Dryness of skin: Bathe daily and use lotion containing lanolin  Dry/Callus Areas: Use pumice stone to remove callus tissue  Diabetic socks (Thick, mixed cotton/acrylic)  Diabetic shoes (Need MD prescription, Check with insurance for coverage)  Eyes  Yearly dilated eye exam  Teeth and Mouth  Brush and Floss Daily  Cleaning/Exam every 6 months  Monitoring Blood Glucose  Fasting glucose (80-120)  Check daily  Pre-Meal (80-120)  2 hrs after the first bit of a meal (80-160)  Check daily for 4 weeks  Bedtime (100-140)  HgbA1C  Check every 3-6 months per MD  Goal <7%  Medical Care American Diabetes Association recommends MD visits every 3-6 months  Exercise  Aerobic exercise (walking, biking, hiking, swimming)   5+ days a week for 30+ minutes  Nutrition  Carbohydrates  Convert to glucose  Consume carbohydrates as a part of meals and snacks  Avoid the use of concentrated sweets as they promote the rapid rise in blood glucose (sweet tea, juice, regular soda, and sweetened beverages)  Choose sugar substitutes for sweetening  Fiber  Slow the uptake of glucose from carbohydrate  Fibrous foods include whole grain breads, cereals, vegetables, beans, peas, and lentils  Use food label to determine foods highest in fiber  Breads should have >2 grams per serving  Cereals should have >3 grams per serving  Sugar  Aim for 0-9 grams per serving  Choose your use of foods containing higher  sugar levels wisely  Counting carbohydrates  Breakfast =  45 grams carbohydrate (3 carb choices)  AM Snack = 15 grams carbohydrate + protein (1 carb choices)  Lunch =  45 grams carbohydrate (3 carb choices)  PM Snack = 15 grams carbohydrate + protein (1 carb choices)  Dinner =  45 grams carbohydrate (3 carb choices)  Bedtime Snack = 15 grams carbohydrate + protein (1 carb choices)  OR   Plate Method for Carbohydrate Control  Use an 8 inch plate  Make 1/2 plate non-starchy vegetables  Make 1/4 plate carbohydrate choice (high fiber)  Make 1/4 plate lean protein  Non-Starchy Vegetables  High in fiber and low in starches/calories  Try to have a large serving a lunch/dinner  Considered a "FREE" choice  High in vitamins, minerals, and anti-oxidants  Deep rich colored vegetables  Proteins  Build and repair tissues and do not contain glucose  Watch for added carbohydrate to protein (such as gravy, breading, sauces)  Choose lean proteins  Bake, broil, grill, and roast proteins,   Serving size = Size of the palm of the hand  Have a protein source at all meals and snacks  Fats  Keep at 1-2 servings per meal  Measure fat servings  Choose reduced fat or low-fat varieties of salad dressings and condiments  Use food label for serving sizes

## 2013-05-24 NOTE — Progress Notes (Addendum)
Medical Nutrition Therapy:  Appt start time: 0800   End time: 0930.  Assessment:  Patient was seen on 05/24/2013 for individual diabetes education.  [name] comes today for education about T2DM. Has h/o GDM in 2004 and reports she has been trying to watch her CHO intake and use the MyPlate method.  Also interested in bariatric surgery and has done extensive research on the RYGB. Discussed RYGB and sleeve gastrectomy procedures as well as potential effects they could have on her DM.  Pt states intent to sign up for and attend bariatric education seminar at [facility]. Has been unable to exercise much d/t knee injury.   Current HbA1c:  Lab Results  Component Value Date   HGBA1C 8.2* 04/27/2013    MEDICATIONS:  See med list. Taking metformin for DM, though not up to full dose yet d/t GI upset. No longer taking Mobic.    DM Self-Mgmt Assessment 05/24/2013  Time since Diabetes Dx Newly diagnosed   Type of Diabetes Type 2  Previous DM Education? Yes  Location of education [place], [place] (2004) for GDM  Work/school missed in year for illness None  Personal health rating (1 = best) 3  How often is MD seen for diabetes? Every 6 months  Hospitalizations in past year 0  Do you check your glucose? No  How often? Daily  Do you exercise? Yes  How often? Walks 2 miles 2x/week  Do you take aspirin? No - but states she was told to by MD  Do you use alcohol? Yes  How often? 1-2 a month  Do you use tobacco? No  Eye exam within a year? Yes  Do you follow a meal plan? No   DIETARY INTAKE:  Usual eating pattern includes 2 meals and 1-3 snacks per day. 24-hr recall:  B (5:30 AM): NONE  Snk (7:30 AM): [place] yogurt (10-15g pro)  Snk (10:30 AM): Banana (med) L (12 PM): Progresso Chicken Tortilla soup (lrgr can), saltines (10 ea), 2 tangerines Snk (2:30-3 PM):  Roasted mixed nuts 1/4-1/3 cup D (6-7 PM): 3 oz baked chicken, non-starchy vegetable, rice or noodles (1/2 cup)  Snk (PM): NONE Beverages:   Coffee (decaf and reg), unsweetened and hot tea, diet soda  Estimated daily energy needs: 1500 calories 170 g carbohydrates 100 g protein 45 g fat (12 g or less as saturated fat)  Progress Towards Goal(s):  In progress.   Nutritional Diagnosis:  Campbell-2.1 Impaired nutrient utilization related to glucose metabolism as evidenced by recent A1c of 8.2% and new diagnosis of T2DM.    Intervention:  Nutrition counseling provided.  Discussed diabetes disease process and treatment options.  Discussed physiology of diabetes and role of obesity on insulin resistance.  Encouraged moderate weight reduction to improve glucose levels.  Discussed role of medications and diet in glucose control  Provided education on macronutrients on glucose levels.  Provided education on carb counting, importance of regularly scheduled meals/snacks, and meal planning  Discussed effects of physical activity on glucose levels and long-term glucose control.  Recommended 150 minutes of physical activity/week.  Reviewed patient medications.  Discussed role of medication on blood glucose and possible side effects  Discussed blood glucose monitoring and interpretation.  Discussed recommended target ranges and individual ranges.    Described short-term complications: hyper- and hypo-glycemia.  Discussed causes,symptoms, and treatment options.  Discussed prevention, detection, and treatment of long-term complications.  Discussed the role of prolonged elevated glucose levels on body systems.  Discussed role of stress on blood  glucose levels and discussed strategies to manage psychosocial issues.  Discussed recommendations for long-term diabetes self-care.  Established checklist for medical, dental, and emotional self-care.  Handouts given during visit include:  Living Well with Diabetes  45g CHO Meal Plan  Bariatric and Wellness Services at [facility] (Program booklet) - link to bariatric seminar registration   Low  CHO Snack List  Meal Planning Card  Samples given during visit include:   PB2: 1 pkt Lot: [id number]; Exp: 08/24/13  Premier Protein shake: 1 bottle Lot: [id number]; Exp: 02/18/14  One Touch Ultra Mini meter:  1 Starter Kit Lot: [id number]; Exp: 01/2014  One Touch Ultra strips: 3 boxes @ 10 strips/box Lot: [id number]; Exp: 01/2014  Barriers to learning/adherance to lifestyle change:  None  Diabetes self-care support plan:   [facility] Diabetes Support Group  Monitoring/Evaluation:  Dietary intake, exercise, A1c, BG trends, and body weight in 4 week(s).

## 2013-05-24 NOTE — Patient Instructions (Signed)
Received notes form [facility] for lestion/spot/mole/growth on right upper back; follow up for skin check in 1 yr.  Sent to Dr. Selena Batten for signature and then to scan box.

## 2013-05-26 ENCOUNTER — Telehealth: Payer: Self-pay | Admitting: Family Medicine

## 2013-05-26 DIAGNOSIS — E119 Type 2 diabetes mellitus without complications: Secondary | ICD-10-CM

## 2013-05-26 LAB — HM DIABETES EYE EXAM

## 2013-05-26 NOTE — Telephone Encounter (Signed)
Pt states she was given a One Touch Ultra glucometer.  However she needs a rx for the lancets and test strips.  Please send a 90 day supply to [facility](tel# [phone]).

## 2013-05-27 MED ORDER — ONETOUCH DELICA LANCETS FINE MISC: 1.0000 | Freq: Every day | Status: DC

## 2013-05-27 MED ORDER — GLUCOSE BLOOD VI STRP: ORAL_STRIP | Status: DC

## 2013-05-27 NOTE — Telephone Encounter (Signed)
Rx sent to pharmacy   

## 2013-05-28 ENCOUNTER — Ambulatory Visit: Payer: BC Managed Care – PPO | Admitting: Family Medicine

## 2013-06-04 ENCOUNTER — Ambulatory Visit (INDEPENDENT_AMBULATORY_CARE_PROVIDER_SITE_OTHER): Payer: BC Managed Care – PPO | Admitting: Family Medicine

## 2013-06-04 ENCOUNTER — Other Ambulatory Visit (INDEPENDENT_AMBULATORY_CARE_PROVIDER_SITE_OTHER): Payer: BC Managed Care – PPO

## 2013-06-04 ENCOUNTER — Ambulatory Visit (INDEPENDENT_AMBULATORY_CARE_PROVIDER_SITE_OTHER)
Admission: RE | Admit: 2013-06-04 | Discharge: 2013-06-04 | Disposition: A | Discharge status: Home / Self Care | Payer: BC Managed Care – PPO | Source: Ambulatory Visit | Attending: Family Medicine | Admitting: Family Medicine

## 2013-06-04 ENCOUNTER — Encounter: Payer: Self-pay | Admitting: Family Medicine

## 2013-06-04 VITALS — BP 132/84 | HR 81 | Wt 290.0 lb

## 2013-06-04 DIAGNOSIS — M25569 Pain in unspecified knee: Secondary | ICD-10-CM

## 2013-06-04 DIAGNOSIS — S8001XA Contusion of right knee, initial encounter: Secondary | ICD-10-CM

## 2013-06-04 DIAGNOSIS — M25561 Pain in right knee: Secondary | ICD-10-CM

## 2013-06-04 DIAGNOSIS — S8010XA Contusion of unspecified lower leg, initial encounter: Secondary | ICD-10-CM

## 2013-06-04 NOTE — Patient Instructions (Signed)
Good to see you No more ladders Continue compression and ice as needed We will get xray Once I have synvisc I will call you and we will get you in.

## 2013-06-04 NOTE — Progress Notes (Signed)
Subjective:    CC: right knee pain follow up  HPI: Patient is a very pleasant 42 year old female returning for continued right knee pain. Patient does have severe chondromalacia with tricompartment degenerative changes of the right knee seen on previous surgery. At last visit patient did have a steroid injection, given further exercises. We discussed over-the-counter medications that could be beneficial for the arthritis. Patient states she was doing very well after the steroid injection but unfortunately fell off a ladder recently. Patient states she fell directly onto this right knee. Patient had some swelling immediately. Patient still has pain that seems to be more on the lateral aspect of the medial aspect where her normal pain is. Patient also states seems to be a little bit below the knee. Very tender to the touch. Patient has been trying compression and Tylenol with minimal improvement. Patient is able to ambulate but states that it is hurtful. Patient denies any radiation of pain, denies any numbness but she states that the leg does feel somewhat weak. Patient denies any redness of the skin and denies any warmth to touch. Unfortunately this was also made her regular pain exacerbated as well. Pain severity 7/10.  Past medical history, Surgical history, Family history not pertinant except as noted below, Social history, Allergies, and medications have been entered into the medical record, reviewed, and no changes needed.   Review of Systems: No fevers, chills, night sweats, weight loss, chest pain, or shortness of breath.   Objective:   Blood pressure 132/84, pulse 81, weight 290 lb (131.543 kg), SpO2 97.00%.  General: Well Developed, well nourished, and in no acute distress. Obese Neuro: Alert and oriented x3, extra-ocular muscles intact, sensation grossly intact.  HEENT: Normocephalic, atraumatic, pupils equal round reactive to light, neck supple, no masses, no lymphadenopathy, thyroid  nonpalpable.  Skin: Warm and dry, no rashes. Cardiac:  no lower extremity edema. Respiratory: Not using accessory muscles, speaking in full sentences. Abdominal: NT, soft Gait: Nonantlagic, good balance and coordination Lymphatic: no lymphadenopathy in neck or axillae on palpation, non tender.  Musculoskeletal: Inspection and palpation of the right and left upper extremities including the shoulders elbows and wrist are unremarkable with full range of motion and good muscle strength and tone. Inspection and palpation of the right and left lower extremities including the hips  and ankles are unremarkable and nontender with full range of motion and good muscle strength and tone and are symmetric. Knee: Right Patient does have trace effusion of the knee in general and does have swelling on the inferior lateral aspect of the most proximal portion of the tibia. In this area patient does have what appears to be a hematoma that is slightly calcified. This is severely tender to palpation. Continue  positive medial joint line tenderness, patellar tenderness, or condyle tenderness. ROM full in flexion and extension and lower leg rotation. Ligaments with solid consistent endpoints including ACL, PCL, LCL, MCL. Positive Mcmurray's, Apley's, and Thessalonian tests. Painful patellar compression. Patellar glide with significant crepitus. Patellar and quadriceps tendons unremarkable but does have some weakness of the quadriceps on the right side compared to the contralateral side Hamstring strength is normal Contralateral knee unremarkable.  Musculoskeletal ultrasound was performed. Interpreted by Antoine Primas, M Limited ultrasound of the proximal tibia does show that patient has what appears to be a fluid collection likely a resolving hematoma. There is speckled heterogeneous aspect that shows there is some calcification occurring. This does appear to be somewhat loculated already. This is also encapsulated  fairly well. Looking at the underlying bone I do not see any true fracture. Patient's patella tendon at the tibial tuberosity is intact.  Impression: Proximal contusion of the tibia and with overlying hematoma. Continued severe osteophytic changes of the knee..  Impression and Recommendations:

## 2013-06-04 NOTE — Assessment & Plan Note (Signed)
What is seen on ultrasound today correspond well with a likely contusion. I will get an x-ray to rule out any type of fracture that is not seen. Especially a tibial plateau fracture I think this is low likelihood the patient been able to weight-bear fairly well. Patient told to attempt to wear the brace, but is giving her pain medications but she declined. Patient is to have some tramadol can take at night as needed. Discuss continuing compression icing. I would like patient to come back once we have prior authorization for Synvisc. Once we have an office we will give her a call.

## 2013-06-24 ENCOUNTER — Ambulatory Visit: Payer: BC Managed Care – PPO | Admitting: Dietician

## 2013-06-24 ENCOUNTER — Encounter: Payer: Self-pay | Admitting: Family Medicine

## 2013-06-24 MED ORDER — HYLAN G-F 20 16 MG/2ML IX SOSY: 1.0000 | PREFILLED_SYRINGE | INTRA_ARTICULAR | Status: DC

## 2013-06-24 NOTE — Telephone Encounter (Signed)
Patient will try viscus supplementation. We will send a prescription to pharmacy. Patient's will pick up the medication if not to expensive. Patient and will follow up again in have injections.

## 2013-06-28 ENCOUNTER — Encounter: Payer: Self-pay | Admitting: Family Medicine

## 2013-06-28 ENCOUNTER — Ambulatory Visit (INDEPENDENT_AMBULATORY_CARE_PROVIDER_SITE_OTHER): Payer: BC Managed Care – PPO | Admitting: Family Medicine

## 2013-06-28 VITALS — BP 120/80 | Temp 98.4°F | Wt 292.0 lb

## 2013-06-28 DIAGNOSIS — E785 Hyperlipidemia, unspecified: Secondary | ICD-10-CM

## 2013-06-28 DIAGNOSIS — E669 Obesity, unspecified: Secondary | ICD-10-CM

## 2013-06-28 NOTE — Progress Notes (Signed)
Pre visit review using our clinic review tool, if applicable. No additional management support is needed unless otherwise documented below in the visit note. 

## 2013-06-28 NOTE — Patient Instructions (Signed)
We recommend the following healthy lifestyle measures: - eat a healthy diet consisting of lots of vegetables, fruits, beans, nuts, seeds, healthy meats such as white chicken and fish and whole grains.  - avoid fried foods, fast food, processed foods, sodas, red meet and other fattening foods.  - get a least 150 minutes of aerobic exercise per week.   -We placed a referral for you as discussed. It usually takes about 1-2 weeks to process and schedule this referral. If you have not heard from [place] regarding this appointment in 2 weeks please contact our office.  Continue to monitor home blood sugars in the meantime and take log to appointment with diabetes specialist

## 2013-06-28 NOTE — Progress Notes (Signed)
Chief Complaint  Patient presents with  . follow up diabetes    HPI:  Follow up:  DM: -home BS: 150-170s Lab Results  Component Value Date   HGBA1C 8.2* 04/27/2013  -lifestyle: has made dramatic dietary changes -meds: metformin - advised to titrate; made her too sick - nausea vomiting, upset stomach, diarrhea - she stopped i a few weeks ago -eye exam: had eye exam  HLD: -mild -started on pravachol last vist Lab Results  Component Value Date   CHOL 174 04/23/2013   HDL 41.70 04/23/2013   LDLCALC 119* 04/23/2013   TRIG 68.0 04/23/2013   CHOLHDL 4 04/23/2013   Obesity: -working on lifestyle -considering bariatric surgery  ROS: See pertinent positives and negatives per HPI.  Past Medical History  Diagnosis Date  . Headache(784.0)     tension or sinus  . Arthritis     right knee  . Depression   . Anxiety   . Medial meniscus tear 12/2012    right knee  . History of MRSA infection prior to 2012    lasted 5-6 yr.  . Dental crowns present   . Jaw snapping     states jaw pops  . Insect bites 12/24/2012    leg  . Complication of anesthesia     has been hard to wake up post-op  . Gestational diabetes 2004  . T2DM (type 2 diabetes mellitus) 05/2013    Past Surgical History  Procedure Laterality Date  . Cholecystectomy    . Hand surgery Left     X 3 - spider bite and MRSA infection  . Wrist surgery Right   . Tonsillectomy    . Knee arthroscopy Right 12/29/2012    Procedure: RIGHT KNEE ARTHROSCOPY  PARTIAL MEDIAL AND LATERAL MENISECTOMY WITH CHONDROPLASTY;  Surgeon: Velna Ochs, MD;  Location: [facility];  Service: Orthopedics;  Laterality: Right;    Family History  Problem Relation Age of Onset  . Diabetes Mother   . Kidney disease Mother   . Hypertension Mother   . Cancer Mother     breast cancer  . Cancer Maternal Grandmother   . Diabetes Maternal Grandmother     History   Social History  . Marital Status: Single    Spouse Name: N/A     Number of Children: N/A  . Years of Education: N/A   Social History Main Topics  . Smoking status: Never Smoker   . Smokeless tobacco: Never Used  . Alcohol Use: Yes     Comment: 1-2 glasses of wine a few times per month  . Drug Use: No  . Sexual Activity: None   Other Topics Concern  . 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 , Disp: , Rfl: ;  DULoxetine (CYMBALTA) 60 MG capsule, Take 60 mg by mouth daily. , Disp: , Rfl: ;  glucose blood (ONE TOUCH ULTRA TEST) test strip, Test daily., Disp: 100 each, Rfl: 12;  Hylan 16 MG/2ML SOSY, Inject 1 Syringe into the articular space once a week., Disp: 3 Syringe, Rfl: 0 ONETOUCH DELICA LANCETS FINE MISC, 1  Device by Does not apply route daily., Disp: 100 each, Rfl: 12;  pravastatin (PRAVACHOL) 20 MG tablet, Take 1 tablet (20 mg total) by mouth daily., Disp: 90 tablet, Rfl: 3;  zolpidem (AMBIEN) 10 MG tablet, Take 10 mg by mouth at bedtime as needed. , Disp: , Rfl:  metFORMIN (GLUCOPHAGE) 500 MG tablet, Take 500 mg in the morning x 1 week, take 500 mg twice daily x 1 week, take 1000 mg in the morning and 500 mg in the evening x 1 week, and then take 1000 mg twice daily., Disp: 120 tablet, Rfl: 3  EXAM:  Filed Vitals:   06/28/13 1502  BP: 120/80  Temp: 98.4 F (36.9 C)    Body mass index is 44.41 kg/(m^2).  GENERAL: vitals reviewed and listed above, alert, oriented, appears well hydrated and in no acute distress  HEENT: atraumatic, conjunttiva clear, no obvious abnormalities on inspection of external nose and ears  NECK: no obvious masses on inspection  LUNGS: clear to auscultation bilaterally, no wheezes, rales or rhonchi, good air movement  CV:  HRRR, no peripheral edema  MS: moves all extremities without noticeable abnormality  PSYCH: pleasant and cooperative, no obvious depression or anxiety  ASSESSMENT AND PLAN:  Discussed the following assessment and plan:  Type II or unspecified type diabetes mellitus without mention of complication, uncontrolled - Plan: Ambulatory referral to Endocrinology  Other and unspecified hyperlipidemia  Obesity, BMI unknown  -discussed various options for her diabetes - seems to be improving some with lifestyle changes and she is committed to lifestyle changes and leary about medications after reaction to metformin -we discussed options including insulin and other medications and she would like to see endocrine for advice prior to starting another pharmacological intervention - referral placed -she is considering bariatric surgery and we discussed this as well -follow up in 3-4 months, sooner if needed -Patient advised to return or notify a doctor immediately if symptoms worsen or persist or new concerns arise.  Patient Instructions  We recommend the following healthy lifestyle measures: - eat a healthy diet consisting of lots of vegetables, fruits, beans, nuts, seeds, healthy meats such as white chicken and fish and whole grains.  - avoid fried foods, fast food, processed foods, sodas, red meet and other fattening foods.  - get a least 150 minutes of aerobic exercise per week.   -We placed a referral for you as discussed. It usually takes about 1-2 weeks to process and schedule this referral. If you have not heard from [place] regarding this appointment in 2 weeks please contact our office.  Continue to monitor home blood sugars in the meantime and take log to appointment with diabetes specialist     Kriste Basque R.

## 2013-06-29 ENCOUNTER — Ambulatory Visit
Admission: RE | Admit: 2013-06-29 | Discharge: 2013-06-29 | Disposition: A | Discharge status: Home / Self Care | Payer: BC Managed Care – PPO | Source: Ambulatory Visit | Attending: Family Medicine | Admitting: Family Medicine

## 2013-06-29 DIAGNOSIS — N6452 Nipple discharge: Secondary | ICD-10-CM

## 2013-06-30 ENCOUNTER — Encounter (INDEPENDENT_AMBULATORY_CARE_PROVIDER_SITE_OTHER): Payer: Self-pay | Admitting: General Surgery

## 2013-06-30 ENCOUNTER — Ambulatory Visit: Payer: BC Managed Care – PPO | Admitting: Family Medicine

## 2013-06-30 ENCOUNTER — Ambulatory Visit (INDEPENDENT_AMBULATORY_CARE_PROVIDER_SITE_OTHER): Payer: BC Managed Care – PPO | Admitting: General Surgery

## 2013-06-30 VITALS — BP 132/88 | HR 66 | Temp 97.1°F | Resp 16 | Ht 68.0 in | Wt 291.0 lb

## 2013-06-30 NOTE — Patient Instructions (Signed)
We will start the work up please call with any questions or email me

## 2013-07-06 ENCOUNTER — Ambulatory Visit (INDEPENDENT_AMBULATORY_CARE_PROVIDER_SITE_OTHER): Payer: BC Managed Care – PPO | Admitting: Family Medicine

## 2013-07-06 ENCOUNTER — Encounter: Payer: Self-pay | Admitting: Family Medicine

## 2013-07-06 ENCOUNTER — Encounter (INDEPENDENT_AMBULATORY_CARE_PROVIDER_SITE_OTHER): Payer: Self-pay | Admitting: General Surgery

## 2013-07-06 VITALS — BP 132/84 | HR 81 | Wt 292.0 lb

## 2013-07-06 DIAGNOSIS — M1710 Unilateral primary osteoarthritis, unspecified knee: Secondary | ICD-10-CM | POA: Insufficient documentation

## 2013-07-06 DIAGNOSIS — M1711 Unilateral primary osteoarthritis, right knee: Secondary | ICD-10-CM

## 2013-07-06 NOTE — Progress Notes (Signed)
CC: right knee pain follow up  HPI: Patient is a very pleasant 42 year old female returning for continued right knee pain. Patient does have severe chondromalacia with tricompartment degenerative changes of the right knee seen on previous surgery. Patient has had injections of cortical steroid without any improvement. Patient is here for Synvisc first injection. Patient continues to do the exercises and wears the brace is sometimes. Patient denies any new symptoms.  Past medical history, Surgical history, Family history not pertinant except as noted below, Social history, Allergies, and medications have been entered into the medical record, reviewed, and no changes needed.   Review of Systems: No fevers, chills, night sweats, weight loss, chest pain, or shortness of breath.   Objective:   Blood pressure 132/84, pulse 81, weight 292 lb (132.45 kg), SpO2 99.00%.   General: Well Developed, well nourished, and in no acute distress. Obese Neuro: Alert and oriented x3, extra-ocular muscles intact, sensation grossly intact.  HEENT: Normocephalic, atraumatic, pupils equal round reactive to light, neck supple, no masses, no lymphadenopathy, thyroid nonpalpable.  Skin: Warm and dry, no rashes. Cardiac:  no lower extremity edema. Respiratory: Not using accessory muscles, speaking in full sentences. Abdominal: NT, soft Gait: Nonantlagic, good balance and coordination Lymphatic: no lymphadenopathy in neck or axillae on palpation, non tender.  Musculoskeletal: Inspection and palpation of the right and left upper extremities including the shoulders elbows and wrist are unremarkable with full range of motion and good muscle strength and tone. Inspection and palpation of the right and left lower extremities including the hips  and ankles are unremarkable and nontender with full range of motion and good muscle strength and tone and are symmetric. Knee: Right Patient does have trace effusion of the knee in  general and does have swelling on the inferior lateral aspect of the most proximal portion of the tibia. In this area patient does have what appears to be a hematoma that is slightly calcified. This is severely tender to palpation. Continue  positive medial joint line tenderness, patellar tenderness, or condyle tenderness. ROM full in flexion and extension and lower leg rotation. Ligaments with solid consistent endpoints including ACL, PCL, LCL, MCL. Positive Mcmurray's, Apley's, and Thessalonian tests. Painful patellar compression. Patellar glide with significant crepitus. Patellar and quadriceps tendons unremarkable but does have some weakness of the quadriceps on the right side compared to the contralateral side Hamstring strength is normal Contralateral knee unremarkable.  Procedure: 16 mg/2.5 mL of Synvisc (sodium hyaluronate) in a prefilled syringe was injected easily into the knee through a 22-gauge needle.

## 2013-07-06 NOTE — Patient Instructions (Signed)
See you in 1 week

## 2013-07-06 NOTE — Assessment & Plan Note (Signed)
First injection given today. Patient will come back in one week for further injection. In addition to this patient will continue over-the-counter medications including vitamin D. Patient should be wearing the brace with activity. Patient will try to do icing 20 minutes twice a day.

## 2013-07-06 NOTE — Progress Notes (Signed)
Patient ID: [name], female   DOB: March 16, 1971, 42 y.o.   MRN: [id number]  Chief Complaint  Patient presents with  . Bariatric Pre-op    HPI [name] is a 42 y.o. female.   HPI 42 year old morbidly obese female referred by Dr.Hannah Selena Batten for evaluation of weight loss surgery. The patient's primary goal is to improve and ultimately eliminate her diabetes mellitus. She is more interested in the laparoscopic Roux-en-Y gastric bypass Or sleeve gastrectomy. She believes the laparoscopic adjustable gastric band requires too much maintenance. Despite numerous attempts for sustained weight loss she has been unsuccessful. She has tried Nutrisystem, [employer], and Slim fast all without any long-term success.  Her main comorbidities include dyslipidemia, diabetes mellitus, Musculoskeletal disease Past Medical History  Diagnosis Date  . Headache(784.0)     tension or sinus  . Arthritis     right knee  . Depression   . Anxiety   . Medial meniscus tear 12/2012    right knee  . History of MRSA infection prior to 2012    lasted 5-6 yr.  . Dental crowns present   . Jaw snapping     states jaw pops  . Insect bites 12/24/2012    leg  . Complication of anesthesia     has been hard to wake up post-op  . Gestational diabetes 2004  . T2DM (type 2 diabetes mellitus) 05/2013  . DVT of lower extremity (deep venous thrombosis) 2012    LLE    Past Surgical History  Procedure Laterality Date  . Cholecystectomy    . Hand surgery Left     X 3 - spider bite and MRSA infection  . Wrist surgery Right   . Tonsillectomy    . Knee arthroscopy Right 12/29/2012    Procedure: RIGHT KNEE ARTHROSCOPY  PARTIAL MEDIAL AND LATERAL MENISECTOMY WITH CHONDROPLASTY;  Surgeon: Velna Ochs, MD;  Location: [facility];  Service: Orthopedics;  Laterality: Right;    Family History  Problem Relation Age of Onset  . Diabetes Mother   . Kidney disease Mother   . Hypertension Mother   .  Cancer Mother     breast cancer  . Cancer Maternal Grandmother   . Diabetes Maternal Grandmother     Social History History  Substance Use Topics  . Smoking status: Never Smoker   . Smokeless tobacco: Never Used  . Alcohol Use: Yes     Comment: 1-2 glasses of wine a few times per month    Allergies  Allergen Reactions  . Sulfa Antibiotics Other (See Comments)    HALLUCINATIONS, FEVER, CHILLS    Current Outpatient Prescriptions  Medication Sig Dispense Refill  . alprazolam (XANAX) 2 MG tablet Take 2 mg by mouth 2 (two) times daily as needed.       . DULoxetine (CYMBALTA) 60 MG capsule Take 60 mg by mouth daily.       Marland Kitchen glucose blood (ONE TOUCH ULTRA TEST) test strip Test daily.  100 each  12  . ONETOUCH DELICA LANCETS FINE MISC 1 Device by Does not apply route daily.  100 each  12  . pravastatin (PRAVACHOL) 20 MG tablet Take 1 tablet (20 mg total) by mouth daily.  90 tablet  3  . zolpidem (AMBIEN) 10 MG tablet Take 10 mg by mouth at bedtime as needed.        No current facility-administered medications for this visit.    Review of Systems Review of Systems  Constitutional: Negative  for fever, activity change, appetite change and unexpected weight change.  HENT: Negative for nosebleeds and trouble swallowing.   Eyes: Negative for photophobia and visual disturbance.  Respiratory: Negative for chest tightness and shortness of breath.        Epworth sleepiness score 2  Cardiovascular: Positive for leg swelling (occasional b/l ankle swelling with prolonged standing). Negative for chest pain.       Denies CP, SOB, orthopnea, PND, DOE; on med for lipid  Gastrointestinal: Negative for nausea, vomiting, abdominal pain, diarrhea and constipation.       S/p lap cholecystectomy; denies reflux  Endocrine:       +DM, latest A1C was 8.2; has had problems with taking metformin - "makes me ill- vomit/diarrhea - almost feels like allergy"  Genitourinary: Negative for dysuria and difficulty  urinating.       +IUD, h/o heavy periods, none currently; G3P1; had normal screening mammogram yesterday  Musculoskeletal: Negative for arthralgias.  Skin: Negative for pallor and rash.  Neurological: Negative for dizziness, seizures, facial asymmetry and numbness.       Denies TIA and amaurosis fugax   Hematological: Negative for adenopathy. Does not bruise/bleed easily.       H/o LLE DVT 2 yrs ago, on coumadin for 6 months, had been on a 14hr car trip and then had surgery; states blood workup for hypercoagulable state was negative. Saw a Dr Cristela Felt in [place], [place]  Psychiatric/Behavioral: Negative for behavioral problems and agitation. The patient is nervous/anxious (can get anxiety attacks sometimes).     Blood pressure 132/88, pulse 66, temperature 97.1 F (36.2 C), temperature source Temporal, resp. rate 16, height 5\' 8"  (1.727 m), weight 291 lb (131.997 kg).  Physical Exam Physical Exam  Vitals reviewed. Constitutional: She is oriented to person, place, and time. She appears well-developed and well-nourished. No distress.  Morbidly obese  HENT:  Head: Normocephalic and atraumatic.  Right Ear: External ear normal.  Left Ear: External ear normal.  Eyes: Conjunctivae are normal. No scleral icterus.  Neck: Normal range of motion. Neck supple. No tracheal deviation present. No thyromegaly present.  Cardiovascular: Normal rate and normal heart sounds.   Pulmonary/Chest: Effort normal and breath sounds normal. No stridor. No respiratory distress. She has no wheezes.  Abdominal: Soft. She exhibits no distension. There is no tenderness. There is no rebound and no guarding.  Well healed trocar sites  Musculoskeletal: She exhibits no edema and no tenderness.  Old scars on knee  Lymphadenopathy:    She has no cervical adenopathy.  Neurological: She is alert and oriented to person, place, and time. She exhibits normal muscle tone.  Skin: Skin is warm and dry. No rash noted. She is not  diaphoretic. No erythema.  Psychiatric: She has a normal mood and affect. Her behavior is normal. Judgment and thought content normal.    Data Reviewed Dr Selena Batten office note 06/28/13 - DM, HPL follow-up Dr Katrinka Blazing office note 06/04/13- Rt knee DDD Labs from 04/27/13- Hgb A1C 8.2, BMET ok except for glucose 203, Na 134, lipids LDL 119 o/w wnl Mammogram report   Assessment    Morbid obesity BMI 44.25 Diabetes Mellitus OA/DDD Rt knee Depression/anxiety H/o LLE DVT    Plan    The patient meets weight loss surgery criteria. I think the patient would be an acceptable candidate for Laparoscopic Roux-en-Y Gastric bypass or sleeve gastrectomy  We discussed laparoscopic Roux-en-Y gastric bypass. We discussed the preoperative, operative and postoperative process. Using diagrams, I explained the surgery  in detail including the performance of an EGD near the end of the surgery and an Upper GI swallow study on POD 1. We discussed the typical hospital course including a 2-3 day stay baring any complications.   The patient was given educational material. I quoted the patient that they can expect to lose 50-70% of their excess weight with the gastric bypass. We did discuss the possibility of weight regain several years after the procedure.  We discussed the risk and benefits of surgery including but not limited to anesthesia risk, bleeding, infection, anastomotic edema requiring a few additional days in the hospital, postop nausea, possible conversion to open procedure, blood clot formation, anastomotic leak, anastomotic stricture, ulcer formation, death, respiratory complications, intestinal blockage, internal hernia, vitamin and nutritional deficiencies, hair loss, weight regain injury to surrounding structures, failure to lose weight and mood changes.  We then discussed laparoscopic sleeve gastrectomy.  Using diagrams, I explained the surgery in detail including the performance of an EGD near the end of the  surgery and an Upper GI swallow study on POD 1. We discussed the typical hospital course including a 2-3 day stay baring any complications.   The patient was given educational material. I quoted the patient that most patients can lose up to 50-65% of their excess weight. We did discuss the possibility of weight regain several years after the procedure.  The risks of infection, bleeding, pain, scarring, weight regain, too little or too much weight loss, vitamin deficiencies and need for lifelong vitamin supplementation, hair loss, need for protein supplementation, leaks, stricture, reflux, food intolerance, hernia, need for reoperation and conversion to roux Y gastric bypass, need for open surgery, injury to spleen or surrounding structures, DVT's, PE, and death again discussed with the patient and the patient expressed understanding and desires to proceed with laparoscopic vertical sleeve gastrectomy, possible open, intraoperative endoscopy.  We discussed that before and after surgery that there would be an alteration in their diet. I explained that we have put them on a diet 2 weeks before surgery. I also explained that they would be on a liquid diet for 2 weeks after surgery. We discussed that they would have to avoid certain foods after surgery. We discussed the importance of physical activity as well as compliance with our dietary and supplement recommendations and routine follow-up.  I explained to the patient that we will start our evaluation process which includes labs, Upper GI to evaluate stomach and swallowing anatomy, nutritionist consultation, psychiatrist consultation, EKG, CXR, outside records from Dr Cristela Felt regarding her history of DVT. I explained that given her history that I might discharge her home prophylactic lovenox for several days. All of her questions were asked and answered.   Mary Sella. Andrey Campanile, MD, FACS General, Bariatric, & Minimally Invasive Surgery [facility],  [place]            [facility] M 07/06/2013, 5:16 PM

## 2013-07-06 NOTE — Progress Notes (Signed)
  Subjective:    Patient ID: [name], female    DOB: [birth year], 42 y.o.   MRN: [id number]  HPI    Review of Systems     Objective:   Physical Exam        Assessment & Plan:

## 2013-07-06 NOTE — Progress Notes (Signed)
Pre-visit discussion using our clinic review tool. No additional management support is needed unless otherwise documented below in the visit note.  

## 2013-07-12 ENCOUNTER — Encounter: Payer: Self-pay | Admitting: Internal Medicine

## 2013-07-12 ENCOUNTER — Ambulatory Visit (INDEPENDENT_AMBULATORY_CARE_PROVIDER_SITE_OTHER): Payer: BC Managed Care – PPO | Admitting: Internal Medicine

## 2013-07-12 VITALS — BP 122/82 | HR 94 | Temp 98.5°F | Resp 10 | Ht 68.0 in | Wt 291.0 lb

## 2013-07-12 NOTE — Patient Instructions (Signed)
Please check sugars 1-2x daily, rotating check times and write them down in the sugar log. Continue with the current diet. Please return in 1 month with your sugar log.   PATIENT INSTRUCTIONS FOR TYPE 2 DIABETES:  DIET AND EXERCISE Diet and exercise is an important part of diabetic treatment.  We recommended aerobic exercise in the form of brisk walking (working between 40-60% of maximal aerobic capacity, similar to brisk walking) for 150 minutes per week (such as 30 minutes five days per week) along with 3 times per week performing 'resistance' training (using various gauge rubber tubes with handles) 5-10 exercises involving the major muscle groups (upper body, lower body and core) performing 10-15 repetitions (or near fatigue) each exercise. Start at half the above goal but build slowly to reach the above goals. If limited by weight, joint pain, or disability, we recommend daily walking in a swimming pool with water up to waist to reduce pressure from joints while allow for adequate exercise.    BLOOD GLUCOSES Monitoring your blood glucoses is important for continued management of your diabetes. Please check your blood glucoses 2-4 times a day: fasting, before meals and at bedtime (you can rotate these measurements - e.g. one day check before the 3 meals, the next day check before 2 of the meals and before bedtime, etc.   HYPOGLYCEMIA (low blood sugar) Hypoglycemia is usually a reaction to not eating, exercising, or taking too much insulin/ other diabetes drugs.  Symptoms include tremors, sweating, hunger, confusion, headache, etc. Treat IMMEDIATELY with 15 grams of Carbs:   4 glucose tablets    cup regular juice/soda   2 tablespoons raisins   4 teaspoons sugar   1 tablespoon honey Recheck blood glucose in 15 mins and repeat above if still symptomatic/blood glucose <100. Please contact our office at [phone] if you have questions about how to next handle your insulin.  RECOMMENDATIONS  TO REDUCE YOUR RISK OF DIABETIC COMPLICATIONS: * Take your prescribed MEDICATION(S). * Follow a DIABETIC diet: Complex carbs, fiber rich foods, heart healthy fish twice weekly, (monounsaturated and polyunsaturated) fats * AVOID saturated/trans fats, high fat foods, >2,300 mg salt per day. * EXERCISE at least 5 times a week for 30 minutes or preferably daily.  * DO NOT SMOKE OR DRINK more than 1 drink a day. * Check your FEET every day. Do not wear tightfitting shoes. Contact us if you develop an ulcer * See your EYE doctor once a year or more if needed * Get a FLU shot once a year * Get a PNEUMONIA vaccine once before and once after age 54 years  GOALS:  * Your Hemoglobin A1c of <7%  * fasting sugars need to be <130 * after meals sugars need to be <180 (2h after you start eating) * Your Systolic BP should be 140 or lower  * Your Diastolic BP should be 80 or lower  * Your HDL (Good Cholesterol) should be 40 or higher  * Your LDL (Bad Cholesterol) should be 100 or lower  * Your Triglycerides should be 150 or lower  * Your Urine microalbumin (kidney function) should be <30 * Your Body Mass Index should be 25 or lower   We will be glad to help you achieve these goals. Our telephone number is: [phone].

## 2013-07-12 NOTE — Progress Notes (Signed)
Patient ID: [name], female   DOB: 01/12/1971, 42 y.o.   MRN: [id number]  HPI: [name] is a 42 y.o.-year-old female, referred by her PCP, Dr. Selena Batten, for management of DM2, non-insulin-dependent, uncontrolled, without complications.  Patient has been diagnosed with gestational diabetes in 2004, then dx with DM2 in 04/2013; she has not been on insulin before. Last hemoglobin A1c was: Lab Results  Component Value Date   HGBA1C 8.2* 04/27/2013   HGBA1C 8.2* 04/23/2013   Pt is on a regimen of: - Metformin 500 mg po bid - started 04/27/2013, stopped 05/2013 as gave her diarrhea/nausea/vomiting - made large changes in diet after last HbA1c She was on Victoza after the pregnancy. She tried Metformin XR after the pregnancy >> N/D  She is interested in GBP sx and has met with Dr Gaynelle Adu: she is interested in the laparoscopic Roux-en-Y gastric bypass or sleeve gastrectomy. Might have the Sx in 01-09/2013. She has tried different diets without sustained weight loss: Nutrisystem, Weight Watchers, and Slim fast all without any long-term success.   Pt checks her sugars 1-2 a day and they are: - am: 110-130 - 2h after b'fast: 97-130 - before lunch: n/c - 2h after lunch: n/c - before dinner: n/c - 2h after dinner: n/c - bedtime: n/c No lows. Lowest sugar was 97; she has hypoglycemia awareness at 97.  Highest sugar was 220.Marland Kitchen  Pt's meals are: - Breakfast: greek yoghurt + banana + coffee or oatmeal - Lunch: sandwich: [place] on wheat +/- pickle or yoghurt - Dinner: noodles + chicken + broccoli or other vegetable, + dessert - Snacks: nuts Unsweet tea, not soda, coffee  - no CKD, last BUN/creatinine:  Lab Results  Component Value Date   BUN 14 04/27/2013   CREATININE 0.8 04/27/2013  Not on ACEI. - last set of lipids: Lab Results  Component Value Date   CHOL 174 04/23/2013   HDL 41.70 04/23/2013   LDLCALC 119* 04/23/2013   TRIG 68.0 04/23/2013   CHOLHDL 4 04/23/2013  She is on  Pravastatin. - last eye exam was in 05/2013. No DR.  - no numbness and tingling in her feet.  I reviewed her chart and she also has a history of obesity, insomnia, anxiety/depression. She had meniscal tear and OA in R knee, s/p surgery and now hyaluronic acid inj. (previously on repeated steroid injections in knee - last ~1 mo ago).  Pt has FH of DM in mother, MGF.  ROS: Constitutional: + weight gain,+ fatigue, + subjective hyperthermia/ (hot flushes), + poor sleep, + excessive urination and nocturia Eyes: + blurry vision, no xerophthalmia ENT: no sore throat, no nodules palpated in throat, no dysphagia/odynophagia, no hoarseness Cardiovascular: no CP/SOB/palpitations/+ leg swelling Respiratory: no cough/SOB Gastrointestinal: +N/+V/+D - all with Metformin/no C Musculoskeletal: no muscle/+ joint aches - knee Skin: no rashes, + hair loss, excessive hair growth Neurological: + tremors/no numbness/tingling/dizziness, + HA + breast d/c Psychiatric: no depression/anxiety + irreg menstrual cycles  Past Medical History  Diagnosis Date  . Headache(784.0)     tension or sinus  . Arthritis     right knee  . Depression   . Anxiety   . Medial meniscus tear 12/2012    right knee  . History of MRSA infection prior to 2012    lasted 5-6 yr.  . Dental crowns present   . Jaw snapping     states jaw pops  . Insect bites 12/24/2012    leg  . Complication of anesthesia  has been hard to wake up post-op  . Gestational diabetes 2004  . T2DM (type 2 diabetes mellitus) 05/2013  . DVT of lower extremity (deep venous thrombosis) 2012    LLE   Past Surgical History  Procedure Laterality Date  . Cholecystectomy    . Hand surgery Left     X 3 - spider bite and MRSA infection  . Wrist surgery Right   . Tonsillectomy    . Knee arthroscopy Right 12/29/2012    Procedure: RIGHT KNEE ARTHROSCOPY  PARTIAL MEDIAL AND LATERAL MENISECTOMY WITH CHONDROPLASTY;  Surgeon: Velna Ochs, MD;  Location:  [facility];  Service: Orthopedics;  Laterality: Right;   History   Social History  . Marital Status: Single    Spouse Name: N/A    Number of Children: 1, daughter 1 1/2 y/o   Occupational History  . Purchasing   Social History Main Topics  . Smoking status: Never Smoker   . Smokeless tobacco: Never Used  . Alcohol Use: Yes     Comment: 1-2 glasses of wine a few times per month  . 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 alprazolam (XANAX) 2 MG tablet Take 2 mg by mouth 2 (two) times daily as needed.       . DULoxetine (CYMBALTA) 60 MG capsule Take 60 mg by mouth daily.       Marland Kitchen glucose blood (ONE TOUCH ULTRA TEST) test strip Test daily.  100 each  12  . ONETOUCH DELICA LANCETS FINE MISC 1 Device by Does not apply route daily.  100 each  12  . pravastatin (PRAVACHOL) 20 MG tablet Take 1 tablet (20 mg total) by mouth daily.  90 tablet  3  . zolpidem (AMBIEN) 10 MG tablet Take 10 mg by mouth at bedtime as needed.        No current facility-administered medications on file prior to visit.   Allergies  Allergen Reactions  . Sulfa Antibiotics Other (See Comments)    HALLUCINATIONS, FEVER, CHILLS   Family History  Problem Relation Age of Onset  . Diabetes Mother   . Kidney disease Mother   . Hypertension Mother   . Cancer Mother     breast cancer  . Cancer Maternal Grandmother   . Diabetes Maternal Grandmother    PE: BP 122/82  Pulse 94  Temp(Src) 98.5 F (36.9 C) (Oral)  Resp 10  Ht 5\' 8"  (1.727 m)  Wt 291 lb (131.997 kg)  BMI 44.26 kg/m2  SpO2 97% Wt Readings from Last 3 Encounters:  07/12/13 291 lb (131.997 kg)  07/06/13 292 lb (132.45 kg)  06/30/13 291 lb (131.997  kg)   Constitutional: overweight, in NAD Eyes: PERRLA, EOMI, no exophthalmos ENT: moist mucous membranes, no thyromegaly, no cervical lymphadenopathy Cardiovascular: RRR, No MRG, + periankle swelling bilat. Respiratory: CTA B Gastrointestinal: abdomen soft, NT, ND, BS+ Musculoskeletal: no deformities, strength intact in all 4 Skin: moist, warm, + acne rash on chin Neurological: + tremor with outstretched hands, DTR normal in UEs, could not elicit them in LEs  ASSESSMENT: 1. DM2, non-insulin-dependent, uncontrolled, without complications  PLAN:  1. Patient with recently dx-ed diabetes, diet-controlled only. She could not tolerate metformin (regular and XR). We only have am sugars for her and they are at target after she improved her diet. - We discussed about options for treatment, and I suggested to:  Patient Instructions  Please check sugars 1-2x daily, rotating check times, and write them down in the sugar log. Continue with the current diet. Please return in 1 month with your sugar log.  - Strongly advised her to start checking sugars at different times of the day - check 1-2 times a day, rotating checks - we discussed that she likely will not need meds after her GBP Sx which she might have in 01-09/2013. - if we do need more meds until then, she is willing to try Glumetza (another formulation of Metformin XR) - given sugar log and advised how to fill it and to bring it at next appt  - given foot care handout and explained the principles  - given instructions for hypoglycemia management "15-15 rule"  - advised for yearly eye exams, she is up to date - Return to clinic in 1 mo with sugar log

## 2013-07-13 ENCOUNTER — Encounter: Payer: Self-pay | Admitting: Family Medicine

## 2013-07-13 ENCOUNTER — Ambulatory Visit (INDEPENDENT_AMBULATORY_CARE_PROVIDER_SITE_OTHER): Payer: BC Managed Care – PPO | Admitting: Family Medicine

## 2013-07-13 ENCOUNTER — Other Ambulatory Visit (INDEPENDENT_AMBULATORY_CARE_PROVIDER_SITE_OTHER): Payer: Self-pay

## 2013-07-13 VITALS — BP 134/82 | HR 96 | Wt 293.0 lb

## 2013-07-13 DIAGNOSIS — M1710 Unilateral primary osteoarthritis, unspecified knee: Secondary | ICD-10-CM

## 2013-07-13 DIAGNOSIS — M9420 Chondromalacia, unspecified site: Secondary | ICD-10-CM

## 2013-07-13 DIAGNOSIS — M1711 Unilateral primary osteoarthritis, right knee: Secondary | ICD-10-CM

## 2013-07-13 NOTE — Assessment & Plan Note (Signed)
Synvisc #2 patient will return again in one week and at that time of the third and final injection.

## 2013-07-13 NOTE — Progress Notes (Signed)
Pre-visit discussion using our clinic review tool. No additional management support is needed unless otherwise documented below in the visit note.  

## 2013-07-13 NOTE — Progress Notes (Signed)
CC: right knee pain follow up  HPI: Patient is a very pleasant 42 year old female returning for continued right knee pain. Patient does have severe chondromalacia with tricompartment degenerative changes of the right knee seen on previous surgery patient is here for second Synvisc injection. Patient states that the first injection she had 2 days without any significant pain. Patient states is the first time in a very long amount of time since she's had pain in this knee. Patient denies any new symptoms.  Past medical history, Surgical history, Family history not pertinant except as noted below, Social history, Allergies, and medications have been entered into the medical record, reviewed, and no changes needed.   Review of Systems: No fevers, chills, night sweats, weight loss, chest pain, or shortness of breath.   Objective:   Blood pressure 134/82, pulse 96, weight 293 lb (132.904 kg), SpO2 97.00%.  General: Well Developed, well nourished, and in no acute distress. Obese Neuro: Alert and oriented x3, extra-ocular muscles intact, sensation grossly intact.  HEENT: Normocephalic, atraumatic, pupils equal round reactive to light, neck supple, no masses, no lymphadenopathy, thyroid nonpalpable.  Skin: Warm and dry, no rashes. Cardiac:  no lower extremity edema. Respiratory: Not using accessory muscles, speaking in full sentences. Abdominal: NT, soft Gait: Nonantlagic, good balance and coordination Lymphatic: no lymphadenopathy in neck or axillae on palpation, non tender.  Musculoskeletal: Inspection and palpation of the right and left upper extremities including the shoulders elbows and wrist are unremarkable with full range of motion and good muscle strength and tone. Inspection and palpation of the right and left lower extremities including the hips  and ankles are unremarkable and nontender with full range of motion and good muscle strength and tone and are symmetric. Knee: Right Patient does  have trace effusion of the knee in general and does have swelling on the inferior lateral aspect of the most proximal portion of the tibia. In this area patient does have what appears to be a hematoma that is slightly calcified. This is severely tender to palpation. Continue  positive medial joint line tenderness, patellar tenderness, or condyle tenderness. ROM full in flexion and extension and lower leg rotation. Ligaments with solid consistent endpoints including ACL, PCL, LCL, MCL. Positive Mcmurray's, Apley's, and Thessalonian tests. Painful patellar compression. Patellar glide with significant crepitus. Patellar and quadriceps tendons unremarkable but does have some weakness of the quadriceps on the right side compared to the contralateral side Hamstring strength is normal Contralateral knee unremarkable.  Procedure: Synvisc #2 16 mg/2.5 mL of Synvisc (sodium hyaluronate) in a prefilled syringe was injected easily into the knee through a 22-gauge needle.

## 2013-07-13 NOTE — Assessment & Plan Note (Signed)
Injected #2 today RTC in 1 week.

## 2013-07-20 ENCOUNTER — Ambulatory Visit (INDEPENDENT_AMBULATORY_CARE_PROVIDER_SITE_OTHER): Payer: BC Managed Care – PPO | Admitting: Family Medicine

## 2013-07-20 ENCOUNTER — Encounter: Payer: Self-pay | Admitting: Family Medicine

## 2013-07-20 VITALS — BP 144/88 | HR 78

## 2013-07-20 DIAGNOSIS — M1710 Unilateral primary osteoarthritis, unspecified knee: Secondary | ICD-10-CM

## 2013-07-20 DIAGNOSIS — M1711 Unilateral primary osteoarthritis, right knee: Secondary | ICD-10-CM

## 2013-07-20 NOTE — Progress Notes (Signed)
CC: right knee pain follow up  HPI: Patient is a very pleasant 42 year old female returning for continued right knee pain. Patient does have severe chondromalacia with tricompartment degenerative changes of the right knee seen on previous surgery patient is here for third and final Synvisc injection. Patient states that the last injection she had 5 days without any significant pain. Patient is happy with results so far. Patient denies any new symptoms.  Past medical history, Surgical history, Family history not pertinant except as noted below, Social history, Allergies, and medications have been entered into the medical record, reviewed, and no changes needed.   Review of Systems: No fevers, chills, night sweats, weight loss, chest pain, or shortness of breath.   Objective:   Blood pressure 144/88, pulse 78, SpO2 97.00%.  General: Well Developed, well nourished, and in no acute distress. Obese Neuro: Alert and oriented x3, extra-ocular muscles intact, sensation grossly intact.  HEENT: Normocephalic, atraumatic, pupils equal round reactive to light, neck supple, no masses, no lymphadenopathy, thyroid nonpalpable.  Skin: Warm and dry, no rashes. Cardiac:  no lower extremity edema. Respiratory: Not using accessory muscles, speaking in full sentences. Abdominal: NT, soft Gait: Nonantlagic, good balance and coordination Lymphatic: no lymphadenopathy in neck or axillae on palpation, non tender.  Musculoskeletal: Inspection and palpation of the right and left upper extremities including the shoulders elbows and wrist are unremarkable with full range of motion and good muscle strength and tone. Inspection and palpation of the right and left lower extremities including the hips  and ankles are unremarkable and nontender with full range of motion and good muscle strength and tone and are symmetric. Knee: Right Patient does have trace effusion of the knee in general and does have swelling on the inferior  lateral aspect of the most proximal portion of the tibia. In this area patient does have what appears to be a hematoma that is slightly calcified. This is severely tender to palpation. Continue  positive medial joint line tenderness, patellar tenderness, or condyle tenderness. ROM full in flexion and extension and lower leg rotation. Ligaments with solid consistent endpoints including ACL, PCL, LCL, MCL. Positive Mcmurray's, Apley's, and Thessalonian tests. Painful patellar compression. Patellar glide with significant crepitus. Patellar and quadriceps tendons unremarkable but does have some weakness of the quadriceps on the right side compared to the contralateral side Hamstring strength is normal Contralateral knee unremarkable.  Procedure: Synvisc #3 16 mg/2.5 mL of Synvisc (sodium hyaluronate) in a prefilled syringe was injected easily into the knee through a 22-gauge needle.

## 2013-07-20 NOTE — Progress Notes (Signed)
Pre-visit discussion using our clinic review tool. No additional management support is needed unless otherwise documented below in the visit note.  

## 2013-07-20 NOTE — Assessment & Plan Note (Signed)
Synvisc #3 done today.  Tolerated it well. Is making improvements.  HEP RTC in 1 month.

## 2013-07-27 ENCOUNTER — Other Ambulatory Visit: Payer: Self-pay

## 2013-07-27 ENCOUNTER — Ambulatory Visit (HOSPITAL_COMMUNITY)
Admission: RE | Admit: 2013-07-27 | Discharge: 2013-07-27 | Disposition: A | Discharge status: Home / Self Care | Payer: BC Managed Care – PPO | Source: Ambulatory Visit | Attending: General Surgery | Admitting: General Surgery

## 2013-07-27 DIAGNOSIS — F329 Major depressive disorder, single episode, unspecified: Secondary | ICD-10-CM | POA: Insufficient documentation

## 2013-07-27 DIAGNOSIS — IMO0002 Reserved for concepts with insufficient information to code with codable children: Secondary | ICD-10-CM | POA: Insufficient documentation

## 2013-07-27 DIAGNOSIS — Z6841 Body Mass Index (BMI) 40.0 and over, adult: Secondary | ICD-10-CM | POA: Insufficient documentation

## 2013-07-27 DIAGNOSIS — Z86718 Personal history of other venous thrombosis and embolism: Secondary | ICD-10-CM | POA: Insufficient documentation

## 2013-07-27 DIAGNOSIS — M1710 Unilateral primary osteoarthritis, unspecified knee: Secondary | ICD-10-CM | POA: Insufficient documentation

## 2013-07-27 DIAGNOSIS — F3289 Other specified depressive episodes: Secondary | ICD-10-CM | POA: Insufficient documentation

## 2013-07-27 DIAGNOSIS — E119 Type 2 diabetes mellitus without complications: Secondary | ICD-10-CM | POA: Insufficient documentation

## 2013-07-28 ENCOUNTER — Encounter: Payer: Self-pay | Admitting: Family Medicine

## 2013-07-29 NOTE — Telephone Encounter (Signed)
We can write her a letter indicating the medical problems she has that would benefit from weight loss and that bariatric surgery is one option for weight loss. Please put the follow in a letter to To whom it may concern and send to place she requested and let her know.  To Whom it May Concern:  "[name], DOB [birth year], has several health conditions for which weight reduction has been advised, including type 2 diabetes,morbid obesity, osteoarthritis of the knees and elevated cholesterol. Bariatric surgery is one viable option for weight loss. "

## 2013-08-03 LAB — CBC WITH DIFFERENTIAL/PLATELET
Basophils Absolute: 0.1 10*3/uL (ref 0.0–0.1)
Eosinophils Absolute: 0.2 10*3/uL (ref 0.0–0.7)
Eosinophils Relative: 2 % (ref 0–5)
HCT: 42.5 % (ref 36.0–46.0)
Lymphocytes Relative: 22 % (ref 12–46)
Lymphs Abs: 2.1 10*3/uL (ref 0.7–4.0)
MCH: 29.5 pg (ref 26.0–34.0)
MCHC: 33.4 g/dL (ref 30.0–36.0)
MCV: 88.2 fL (ref 78.0–100.0)
Monocytes Absolute: 0.6 10*3/uL (ref 0.1–1.0)
Platelets: 414 10*3/uL — ABNORMAL HIGH (ref 150–400)
RDW: 13.1 % (ref 11.5–15.5)

## 2013-08-03 LAB — HEPATIC FUNCTION PANEL
ALT: 16 U/L (ref 0–35)
AST: 14 U/L (ref 0–37)
Alkaline Phosphatase: 81 U/L (ref 39–117)
Bilirubin, Direct: 0.1 mg/dL (ref 0.0–0.3)
Total Bilirubin: 0.5 mg/dL (ref 0.3–1.2)
Total Protein: 6.7 g/dL (ref 6.0–8.3)

## 2013-08-04 LAB — H. PYLORI ANTIBODY, IGG: H Pylori IgG: 0.89 {ISR}

## 2013-08-11 ENCOUNTER — Ambulatory Visit (INDEPENDENT_AMBULATORY_CARE_PROVIDER_SITE_OTHER): Payer: BC Managed Care – PPO | Admitting: Family Medicine

## 2013-08-11 ENCOUNTER — Encounter: Payer: Self-pay | Admitting: Family Medicine

## 2013-08-11 VITALS — BP 104/70 | Temp 99.5°F | Wt 289.0 lb

## 2013-08-11 DIAGNOSIS — R21 Rash and other nonspecific skin eruption: Secondary | ICD-10-CM

## 2013-08-11 MED ORDER — MUPIROCIN 2 % EX OINT: 1.0000 "application " | TOPICAL_OINTMENT | Freq: Two times a day (BID) | CUTANEOUS | Status: DC

## 2013-08-11 MED ORDER — DOXYCYCLINE HYCLATE 100 MG PO TABS: 100.0000 mg | ORAL_TABLET | Freq: Two times a day (BID) | ORAL | Status: DC

## 2013-08-11 NOTE — Progress Notes (Signed)
Pre visit review using our clinic review tool, if applicable. No additional management support is needed unless otherwise documented below in the visit note. 

## 2013-08-11 NOTE — Patient Instructions (Signed)
-  start the oral and topical antibiotics today  -hypoallergenic detergents and soaps  -call your dermatologist for an appointment today  -see a doctor immediately if worsening or other symptoms

## 2013-08-11 NOTE — Progress Notes (Signed)
Chief Complaint  Patient presents with  . spots on face and back    HPI:  Acute visit for:  Skin Issues: -hx of eczema: -these lesions started over 1 month ago -on face and buttocks -a little itchy, scaly dry patches that she picks and now have gotten worse -denies: fevers, malaise, abscess, drainage, foreign travel -uses Neutragena wash, tide soap  ROS: See pertinent positives and negatives per HPI.  Past Medical History  Diagnosis Date  . Headache(784.0)     tension or sinus  . Arthritis     right knee  . Depression   . Anxiety   . Medial meniscus tear 12/2012    right knee  . History of MRSA infection prior to 2012    lasted 5-6 yr.  . Dental crowns present   . Jaw snapping     states jaw pops  . Insect bites 12/24/2012    leg  . Complication of anesthesia     has been hard to wake up post-op  . Gestational diabetes 2004  . T2DM (type 2 diabetes mellitus) 05/2013  . DVT of lower extremity (deep venous thrombosis) 2012    LLE    Past Surgical History  Procedure Laterality Date  . Cholecystectomy    . Hand surgery Left     X 3 - spider bite and MRSA infection  . Wrist surgery Right   . Tonsillectomy    . Knee arthroscopy Right 12/29/2012    Procedure: RIGHT KNEE ARTHROSCOPY  PARTIAL MEDIAL AND LATERAL MENISECTOMY WITH CHONDROPLASTY;  Surgeon: Velna Ochs, MD;  Location: [facility];  Service: Orthopedics;  Laterality: Right;    Family History  Problem Relation Age of Onset  . Diabetes Mother   . Kidney disease Mother   . Hypertension Mother   . Cancer Mother     breast cancer  . Cancer Maternal Grandmother   . Diabetes Maternal Grandmother     History   Social History  . Marital Status: Single    Spouse Name: N/A    Number of Children: N/A  . Years of Education: N/A   Social History Main Topics  . Smoking status: Never Smoker   . Smokeless tobacco: Never Used  . Alcohol Use: Yes     Comment: 1-2 glasses of wine a few times  per month  . Drug Use: No  . Sexual Activity: No   Other Topics Concern  . 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 , Disp: , Rfl: ;  DULoxetine (CYMBALTA) 60 MG capsule, Take 60 mg by mouth daily. , Disp: , Rfl: ;  glucose blood (ONE TOUCH ULTRA TEST) test strip, Test daily., Disp: 100 each, Rfl: 12;  ONETOUCH DELICA LANCETS FINE MISC, 1 Device by Does not apply route daily., Disp: 100 each, Rfl: 12 pravastatin (PRAVACHOL) 20 MG tablet, Take 1 tablet (20 mg total) by mouth daily., Disp: 90 tablet, Rfl: 3;  zolpidem (AMBIEN) 10 MG tablet, Take 10 mg by mouth at bedtime as needed. , Disp: ,  Rfl: ;  doxycycline (VIBRA-TABS) 100 MG tablet, Take 1 tablet (100 mg total) by mouth 2 (two) times daily., Disp: 20 tablet, Rfl: 0;  mupirocin ointment (BACTROBAN) 2 %, Place 1 application into the nose 2 (two) times daily., Disp: 22 g, Rfl: 0  EXAM:  Filed Vitals:   08/11/13 0806  BP: 104/70  Temp: 99.5 F (37.5 C)    Body mass index is 43.95 kg/(m^2).  GENERAL: vitals reviewed and listed above, alert, oriented, appears well hydrated and in no acute distress  HEENT: atraumatic, conjunttiva clear, no obvious abnormalities on inspection of external nose and ears  NECK: no obvious masses on inspection  SKIN: erythematous scaly and crusty patches on chin and on bilat buttockes with some ulceration of lesions on buttocks  MS: moves all extremities without noticeable abnormality  PSYCH: pleasant and cooperative, no obvious depression or anxiety  ASSESSMENT AND PLAN:  Discussed the following assessment and plan:  Rash and nonspecific skin eruption - Plan: doxycycline (VIBRA-TABS)  100 MG tablet, mupirocin ointment (BACTROBAN) 2 %  -unsure of etiology of initial lesions (posisbly acne or eczema) but appear mildy infected which may be due to picking. Will start abx and she will schedule appt with her dermatologist as well for follow up and further treatment. -Patient advised to return or notify a doctor immediately if symptoms worsen or persist or new concerns arise.  Patient Instructions  -start the oral and topical antibiotics today  -hypoallergenic detergents and soaps  -call your dermatologist for an appointment today  -see a doctor immediately if worsening or other symptoms     Deniz Eskridge R.

## 2013-08-12 DIAGNOSIS — G629 Polyneuropathy, unspecified: Secondary | ICD-10-CM

## 2013-08-12 HISTORY — DX: Polyneuropathy, unspecified: G62.9

## 2013-08-16 ENCOUNTER — Ambulatory Visit: Payer: BC Managed Care – PPO | Admitting: Internal Medicine

## 2013-08-19 ENCOUNTER — Ambulatory Visit: Payer: BC Managed Care – PPO | Admitting: Family Medicine

## 2013-09-13 ENCOUNTER — Ambulatory Visit: Payer: BC Managed Care – PPO | Admitting: Internal Medicine

## 2013-09-16 ENCOUNTER — Ambulatory Visit (INDEPENDENT_AMBULATORY_CARE_PROVIDER_SITE_OTHER): Payer: BC Managed Care – PPO | Admitting: Internal Medicine

## 2013-09-16 ENCOUNTER — Encounter: Payer: Self-pay | Admitting: Internal Medicine

## 2013-09-16 VITALS — BP 102/76 | HR 79 | Temp 98.2°F | Resp 12 | Wt 290.8 lb

## 2013-09-16 DIAGNOSIS — IMO0001 Reserved for inherently not codable concepts without codable children: Secondary | ICD-10-CM

## 2013-09-16 DIAGNOSIS — E1165 Type 2 diabetes mellitus with hyperglycemia: Principal | ICD-10-CM

## 2013-09-16 LAB — HEMOGLOBIN A1C: HEMOGLOBIN A1C: 7.8 % — AB (ref 4.6–6.5)

## 2013-09-16 MED ORDER — CANAGLIFLOZIN 100 MG PO TABS: ORAL_TABLET | ORAL | Status: DC

## 2013-09-16 NOTE — Patient Instructions (Signed)
Please start Invokana 100 mg in am. Please stop at the lab. Please return in 3 months with your sugar log.

## 2013-09-16 NOTE — Progress Notes (Signed)
Patient ID: [name], female   DOB: 08-21-1970, 43 y.o.   MRN: [id number]  HPI: [name] is a 42 y.o.-year-old female, returning for f/u for DM2, dx 04/2013 (previously GDM in 2004), non-insulin-dependent, uncontrolled, without complications. Last visit 2 mo ago.  Last hemoglobin A1c was: Lab Results  Component Value Date   HGBA1C 8.2* 04/27/2013   HGBA1C 8.2* 04/23/2013   Pt is on a regimen of: - Metformin 500 mg po bid - started 04/27/2013, stopped 05/2013 as gave her diarrhea/nausea/vomiting (even Glumetza) She made large changes in diet after last HbA1c She was on Victoza after the pregnancy.  She is interested in GBP sx and has met with Dr Gaynelle Adu: she is interested in the laparoscopic Roux-en-Y gastric bypass or sleeve gastrectomy. Might have the Sx in 01-09/2013. She has tried different diets without sustained weight loss: Nutrisystem, Weight Watchers, and Slim fast all without any long-term success.   Pt checks her sugars 0-1 a day and they are higher (stress at work): - am: 110-130 >> 141-191 (140-160) - 2h after b'fast: 97-130 - before lunch: n/c >> 123 - 2h after lunch: n/c >> 113-159 - before dinner: n/c >> 99-102 - 2h after dinner: n/c >> 122-163 - bedtime: n/c No lows. Lowest sugar was 97; she has hypoglycemia awareness at 97.  Highest sugar was 191 this am.  Pt's meals are: - Breakfast: greek yoghurt + banana + coffee or oatmeal - Lunch: sandwich: [place] on wheat +/- pickle or yoghurt - Dinner: noodles + chicken + broccoli or other vegetable, + dessert - Snacks: nuts Unsweet tea, not soda, coffee  - no CKD, last BUN/creatinine:  Lab Results  Component Value Date   BUN 14 04/27/2013   CREATININE 0.8 04/27/2013  Not on ACEI. - last set of lipids: Lab Results  Component Value Date   CHOL 174 04/23/2013   HDL 41.70 04/23/2013   LDLCALC 119* 04/23/2013   TRIG 68.0 04/23/2013   CHOLHDL 4 04/23/2013  She is on Pravastatin. - last eye exam was in 05/2013.  No DR.  - no numbness and tingling in her feet.  She also has a history of obesity, insomnia, anxiety/depression. She had meniscal tear and OA in R knee, s/p surgery and now hyaluronic acid inj. (previously on repeated steroid injections in knee).  I reviewed pt's medications, allergies, PMH, social hx, family hx and no changes required, except as mentioned above.  ROS: Constitutional: no weight gain,+ fatigue, + subjective hyperthermia/ (hot flushes), + poor sleep Eyes: no blurry vision, no xerophthalmia ENT: no sore throat, no nodules palpated in throat, no dysphagia/odynophagia, no hoarseness Cardiovascular: no CP/SOB/palpitations/+ leg swelling Respiratory: no cough/SOB Gastrointestinal: no N/V/D/C Musculoskeletal: no muscle/+ joint aches - knee Skin: + rash on face  - acne, + hair loss, excessive hair growth  PE: BP 102/76  Pulse 79  Temp(Src) 98.2 F (36.8 C) (Oral)  Resp 12  Wt 290 lb 12.8 oz (131.906 kg)  SpO2 96% Wt Readings from Last 3 Encounters:  09/16/13 290 lb 12.8 oz (131.906 kg)  08/11/13 289 lb (131.09 kg)  07/13/13 293 lb (132.904 kg)  Body mass index is 44.23 kg/(m^2).  Constitutional: obese, in NAD Eyes: PERRLA, EOMI, no exophthalmos ENT: moist mucous membranes, no thyromegaly, no cervical lymphadenopathy Cardiovascular: RRR, No MRG, + periankle swelling bilat. Respiratory: CTA B Gastrointestinal: abdomen soft, NT, ND, BS+ Musculoskeletal: no deformities, strength intact in all 4 Skin: moist, warm, + acne rash on chin  ASSESSMENT: 1. DM2, non-insulin-dependent,  uncontrolled, without complications  PLAN:  1. Patient with recently dx-ed diabetes, diet-controlled. She could not tolerate metformin (regular and XR). We only have am sugars for her and they are at target after she improved her diet. - We discussed about options for treatment, and I suggested to:  Patient Instructions  Please start Invokana 100 mg in am. Please stop at the lab. Please  return in 3 months with your sugar log.  - we discussed about SEs of Invokana, which are: dizziness (advised to be careful when stands from sitting position), decreased BP - usually not < normal (BP today is not low), and fungal UTIs (advised to let me know if develops one).  - given samples of Invokana - continue checking sugars at different times of the day - check 1-2 times a day, rotating checks - we discussed that she likely will not need Invokana after her GBP Sx which she might have in 1-2 mo, if approved by insurance - she is up to date with yearly eye exams - will check A1c today - Return in about 3 months (around 12/14/2013). with sugar log   Office Visit on 09/16/2013  Component Date Value Range Status  . Hemoglobin A1C 09/16/2013 7.8* 4.6 - 6.5 % Final   Glycemic Control Guidelines for People with Diabetes:Non Diabetic:  <6%Goal of Therapy: <7%Additional Action Suggested:  >8%    Improved A1c.

## 2013-10-08 ENCOUNTER — Ambulatory Visit (INDEPENDENT_AMBULATORY_CARE_PROVIDER_SITE_OTHER): Payer: BC Managed Care – PPO | Admitting: Family Medicine

## 2013-10-08 ENCOUNTER — Ambulatory Visit: Payer: BC Managed Care – PPO | Admitting: Family Medicine

## 2013-10-08 ENCOUNTER — Encounter: Payer: Self-pay | Admitting: Family Medicine

## 2013-10-08 ENCOUNTER — Ambulatory Visit (HOSPITAL_COMMUNITY): Payer: BC Managed Care – PPO | Attending: Family Medicine

## 2013-10-08 VITALS — BP 132/80 | Temp 99.3°F | Wt 291.0 lb

## 2013-10-08 DIAGNOSIS — M79669 Pain in unspecified lower leg: Secondary | ICD-10-CM

## 2013-10-08 DIAGNOSIS — E785 Hyperlipidemia, unspecified: Secondary | ICD-10-CM | POA: Insufficient documentation

## 2013-10-08 DIAGNOSIS — Z86718 Personal history of other venous thrombosis and embolism: Secondary | ICD-10-CM | POA: Insufficient documentation

## 2013-10-08 DIAGNOSIS — R609 Edema, unspecified: Secondary | ICD-10-CM

## 2013-10-08 DIAGNOSIS — E669 Obesity, unspecified: Secondary | ICD-10-CM | POA: Insufficient documentation

## 2013-10-08 DIAGNOSIS — E119 Type 2 diabetes mellitus without complications: Secondary | ICD-10-CM | POA: Insufficient documentation

## 2013-10-08 DIAGNOSIS — M79609 Pain in unspecified limb: Secondary | ICD-10-CM

## 2013-10-08 DIAGNOSIS — M7989 Other specified soft tissue disorders: Secondary | ICD-10-CM | POA: Insufficient documentation

## 2013-10-08 NOTE — Progress Notes (Signed)
Pre visit review using our clinic review tool, if applicable. No additional management support is needed unless otherwise documented below in the visit note. 

## 2013-10-08 NOTE — Progress Notes (Signed)
Chief Complaint  Patient presents with  . Joint Pain    HPI:  Acute visit for:  1)Leg pain: -started 1 week ago -pain is in upper calf behind knee -worried about a blood clot - hx of DVT thought to be related to surgery, birth control and travel -denies fevers, malaise, SOB,no long trips or surgeries recently, hormone therapy, weakness, numbness, injury -walks daily about 1 mile  ROS: See pertinent positives and negatives per HPI.  Past Medical History  Diagnosis Date  . Headache(784.0)     tension or sinus  . Arthritis     right knee  . Depression   . Anxiety   . Medial meniscus tear 12/2012    right knee  . History of MRSA infection prior to 2012    lasted 5-6 yr.  . Dental crowns present   . Jaw snapping     states jaw pops  . Insect bites 12/24/2012    leg  . Complication of anesthesia     has been hard to wake up post-op  . Gestational diabetes 2004  . T2DM (type 2 diabetes mellitus) 05/2013  . DVT of lower extremity (deep venous thrombosis) 2012    LLE  . Acute meniscal tear of knee 09/22/2012    Right knee.    . Chondromalacia 04/30/2013    Seen on MRI as well as during surgery by Dr. Rhona Raider Tricompartmental degenerative changes   . Primary localized osteoarthrosis, lower leg 07/06/2013    Synvisc series started July 06, 2013     Past Surgical History  Procedure Laterality Date  . Cholecystectomy    . Hand surgery Left     X 3 - spider bite and MRSA infection  . Wrist surgery Right   . Tonsillectomy    . Knee arthroscopy Right 12/29/2012    Procedure: RIGHT KNEE ARTHROSCOPY  PARTIAL MEDIAL AND LATERAL MENISECTOMY WITH CHONDROPLASTY;  Surgeon: Hessie Dibble, MD;  Location: [facility];  Service: Orthopedics;  Laterality: Right;    Family History  Problem Relation Age of Onset  . Diabetes Mother   . Kidney disease Mother   . Hypertension Mother   . Cancer Mother     breast cancer  . Cancer Maternal Grandmother   . Diabetes  Maternal Grandmother     History   Social History  . Marital Status: Single    Spouse Name: N/A    Number of Children: N/A  . Years of Education: N/A   Social History Main Topics  . Smoking status: Never Smoker   . Smokeless tobacco: Never Used  . Alcohol Use: Yes     Comment: 1-2 glasses of wine a few times per month  . Drug Use: No  . Sexual Activity: No   Other Topics Concern  . 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 , Disp: , Rfl: ;  Canagliflozin (INVOKANA) 100 MG TABS, Take by mouth 100 mg in am, Disp: 30 tablet, Rfl: 2;  DULoxetine (CYMBALTA) 60 MG capsule, Take 60 mg  by mouth daily. , Disp: , Rfl: ;  glucose blood (ONE TOUCH ULTRA TEST) test strip, Test daily., Disp: 100 each, Rfl: 12 mupirocin ointment (BACTROBAN) 2 %, Place 1 application into the nose 2 (two) times daily., Disp: 22 g, Rfl: 0;  ONETOUCH DELICA LANCETS FINE MISC, 1 Device by Does not apply route daily., Disp: 100 each, Rfl: 12;  pravastatin (PRAVACHOL) 20 MG tablet, Take 1 tablet (20 mg total) by mouth daily., Disp: 90 tablet, Rfl: 3;  zolpidem (AMBIEN) 10 MG tablet, Take 10 mg by mouth at bedtime as needed. , Disp: , Rfl:   EXAM:  Filed Vitals:   10/08/13 1055  BP: 132/80  Temp: 99.3 F (37.4 C)    Body mass index is 44.26 kg/(m^2).  GENERAL: vitals reviewed and listed above, alert, oriented, appears well hydrated and in no acute distress  HEENT: atraumatic, conjunttiva clear, no obvious abnormalities on inspection of external nose and ears  NECK: no obvious masses on inspection  LUNGS: clear to auscultation bilaterally, no wheezes, rales or rhonchi, good air movement  CV: HRRR,  no peripheral edema  MS: moves all extremities without noticeable abnormality - normal appearance of LE bilat with no swelling or erythema, TTP up posterior calf with some varicose veins, normal gait  PSYCH: pleasant and cooperative, no obvious depression or anxiety  ASSESSMENT AND PLAN:  Discussed the following assessment and plan:  Hyperlipidemia  Calf pain - Plan: Lower Extremity Venous Duplex Left  History of DVT (deep vein thrombosis) - Plan: Lower Extremity Venous Duplex Left  -duplex to ro dvt given hx -if dvt xarelto and hematology referral - discussed -dvt less likely per exam and query muscular pain, if [place] neg heat, stretching and follow up in 1 month -return/emergency precautions discussed -Patient advised to return or notify a doctor immediately if symptoms worsen or persist or new concerns arise.  Patient Instructions  -get ultrasound     Colin Benton R.

## 2013-10-14 ENCOUNTER — Telehealth: Payer: Self-pay | Admitting: Dietician

## 2013-10-14 ENCOUNTER — Other Ambulatory Visit (INDEPENDENT_AMBULATORY_CARE_PROVIDER_SITE_OTHER): Payer: Self-pay | Admitting: General Surgery

## 2013-10-14 NOTE — Telephone Encounter (Signed)
Emailed [facility] the Pre-Op Diet and Protein Shakes information to start following 2 weeks before surgery.

## 2013-10-18 LAB — HM DIABETES EYE EXAM

## 2013-10-21 ENCOUNTER — Encounter (HOSPITAL_COMMUNITY): Payer: Self-pay | Admitting: Pharmacy Technician

## 2013-10-21 ENCOUNTER — Ambulatory Visit (INDEPENDENT_AMBULATORY_CARE_PROVIDER_SITE_OTHER): Payer: BC Managed Care – PPO | Admitting: General Surgery

## 2013-10-21 ENCOUNTER — Encounter (INDEPENDENT_AMBULATORY_CARE_PROVIDER_SITE_OTHER): Payer: Self-pay | Admitting: General Surgery

## 2013-10-21 VITALS — BP 110/80 | HR 82 | Temp 98.3°F | Resp 16 | Ht 68.0 in | Wt 288.2 lb

## 2013-10-21 MED ORDER — OXYCODONE HCL 5 MG/5ML PO SOLN: 5.0000 mg | ORAL | Status: DC | PRN

## 2013-10-21 NOTE — Progress Notes (Signed)
Patient ID: [name], female   DOB: 11/11/1970, 42 y.o.   MRN: [id number]  Chief Complaint  Patient presents with  . Bariatric Pre-op    ryngb     HPI [name] is a 42 y.o. female.   HPI 42-year-old morbidly obese Caucasian female comes in today for a preoperative appointment. She is currently scheduled to undergo a laparoscopic Roux-en-Y gastric bypass on March 24 at [facility]. I initially met her in November 2014. Her weight at that time was 291 pounds. She denies any significant medical changes since she was initially seen. Her diabetes medication has been changed around. Endocrinologist - Dr Cristina Gherghe.   Past Medical History  Diagnosis Date  . Headache(784.0)     tension or sinus  . Arthritis     right knee  . Depression   . Anxiety   . Medial meniscus tear 12/2012    right knee  . History of MRSA infection prior to 2012    lasted 5-6 yr.  . Dental crowns present   . Jaw snapping     states jaw pops  . Insect bites 12/24/2012    leg  . Complication of anesthesia     has been hard to wake up post-op  . Gestational diabetes 2004  . T2DM (type 2 diabetes mellitus) 05/2013  . DVT of lower extremity (deep venous thrombosis) 2012    LLE  . Acute meniscal tear of knee 09/22/2012    Right knee.    . Chondromalacia 04/30/2013    Seen on MRI as well as during surgery by Dr. Dalldorf Tricompartmental degenerative changes   . Primary localized osteoarthrosis, lower leg 07/06/2013    Synvisc series started July 06, 2013     Past Surgical History  Procedure Laterality Date  . Cholecystectomy    . Hand surgery Left     X 3 - spider bite and MRSA infection  . Wrist surgery Right   . Tonsillectomy    . Knee arthroscopy Right 12/29/2012    Procedure: RIGHT KNEE ARTHROSCOPY  PARTIAL MEDIAL AND LATERAL MENISECTOMY WITH CHONDROPLASTY;  Surgeon: Peter G Dalldorf, MD;  Location: [facility];  Service: Orthopedics;  Laterality: Right;     Family History  Problem Relation Age of Onset  . Diabetes Mother   . Kidney disease Mother   . Hypertension Mother   . Cancer Mother     breast cancer  . Cancer Maternal Grandmother   . Diabetes Maternal Grandmother     Social History History  Substance Use Topics  . Smoking status: Never Smoker   . Smokeless tobacco: Never Used  . Alcohol Use: Yes     Comment: 1-2 glasses of wine a few times per month    Allergies  Allergen Reactions  . Sulfa Antibiotics Other (See Comments)    HALLUCINATIONS, FEVER, CHILLS    Current Outpatient Prescriptions  Medication Sig Dispense Refill  . acetaminophen (TYLENOL) 500 MG tablet Take 1,000 mg by mouth every 6 (six) hours as needed for mild pain.      . alprazolam (XANAX) 2 MG tablet Take 2 mg by mouth 2 (two) times daily as needed for anxiety.       . DULoxetine (CYMBALTA) 60 MG capsule Take 120 mg by mouth every morning.       . minoxidil (ROGAINE) 2 % external solution Apply 1 application topically daily.      . zolpidem (AMBIEN) 10 MG tablet Take 10 mg by   mouth at bedtime.        No current facility-administered medications for this visit.    Review of Systems Review of Systems  Constitutional: Negative for fever, activity change, appetite change and unexpected weight change.  HENT: Negative for nosebleeds and trouble swallowing.   Eyes: Negative for photophobia and visual disturbance.  Respiratory: Negative for chest tightness and shortness of breath.   Cardiovascular: Negative for chest pain and leg swelling.       Denies CP, SOB, orthopnea, PND, DOE  Gastrointestinal: Negative for nausea, vomiting, abdominal pain, diarrhea and constipation.       Denies reflux  Endocrine:       A1C improved slightly. Recently stopped oral DM agent per endocrinologist for upcoming RYGB.  Genitourinary: Negative for dysuria and difficulty urinating.  Musculoskeletal: Negative for arthralgias.  Skin: Negative for pallor and rash.   Neurological: Negative for dizziness, seizures, facial asymmetry and numbness.       Denies TIA and amaurosis fugax   Hematological: Negative for adenopathy. Does not bruise/bleed easily.       Remote h/o LLE DVT after prolonged car trip - hypercoag workup negative  Psychiatric/Behavioral: Negative for behavioral problems and agitation.    Blood pressure 110/80, pulse 82, temperature 98.3 F (36.8 C), temperature source Oral, resp. rate 16, height 5' 8" (1.727 m), weight 288 lb 3.2 oz (130.727 kg).  Physical Exam Physical Exam  Vitals reviewed. Constitutional: She is oriented to person, place, and time. She appears well-developed and well-nourished. No distress.  HENT:  Head: Normocephalic and atraumatic.  Right Ear: External ear normal.  Left Ear: External ear normal.  Eyes: Conjunctivae are normal. No scleral icterus.  Neck: Normal range of motion. Neck supple. No tracheal deviation present. No thyromegaly present.  Cardiovascular: Normal rate and normal heart sounds.   Pulmonary/Chest: Effort normal and breath sounds normal. No stridor. No respiratory distress. She has no wheezes.  Abdominal: Soft. She exhibits no distension. There is no tenderness. There is no rebound and no guarding.  Well healed trocar sites  Musculoskeletal: She exhibits no edema and no tenderness.  Lymphadenopathy:    She has no cervical adenopathy.  Neurological: She is alert and oriented to person, place, and time. She exhibits normal muscle tone.  Skin: Skin is warm and dry. No rash noted. She is not diaphoretic. No erythema.  Psychiatric: She has a normal mood and affect. Her behavior is normal. Judgment and thought content normal.    Data Reviewed My office note 06/30/13 UGI - wnl cxr - wnl mammo - stable A1C - 8.2 and now 7.8 Negative LLE duplex  Assessment    Morbid obesity BMI 43.82 HPL DM 2 DJD Right knee      Plan    We reviewed her preoperative workup including an upper GI lab  work. She's started her preoperative diet. All of her questions were asked and answered. She was given MiraLAX bowel prep. She was given her postoperative pain medicine prescription of oxycodone elixir at today's appointment. She scheduled to attend her preoperative nutrition class next week. She is encouraged to contact the office should she have any additional questions between now and surgery  Angelyn Osterberg M. Cheryl Przybylski, MD, FACS General, Bariatric, & Minimally Invasive Surgery [facility]        [name] M 10/21/2013, 4:51 PM    

## 2013-10-21 NOTE — Patient Instructions (Signed)
   Two weeks prior to surgery  Go on the extremely low carb liquid diet - this will decrease the size of your liver  which will make surgery safer  Attend preoperative appointment with your surgeon  Attend preoperative surgery class  One week prior to surgery  No aspirin products.  Tylenol is acceptable   24 hours prior to surgery  No alcoholic beverages  Report fever greater than 100.5 or excessive nasal drainage suggesting infection  Continue bariatric preop diet  Perform bowel prep if ordered  Do not eat or drink anything after midnight the night before surgery  Do not take any medications except those instructed by the anesthesiologist  Morning of surgery  Please arrive at the hospital at least 2 hours before your scheduled surgery time.  No makeup, fingernail polish or jewelry  Bring insurance cards with you  Bring your CPAP mask if you use this

## 2013-10-22 ENCOUNTER — Other Ambulatory Visit (HOSPITAL_COMMUNITY): Payer: Self-pay | Admitting: General Surgery

## 2013-10-22 NOTE — Patient Instructions (Addendum)
Christie  10/22/2013   Your procedure is scheduled on: Tuesday March 24th  Report to [facility] at 845  AM.  Call this number if you have problems the morning of surgery [phone]   Remember: [place]  Do not eat food or drink liquids :After Midnight.     Take these medicines the morning of surgery with A SIP OF WATER: xanax if needed, cymbalta                                SEE [facility] PREPARING FOR SURGERY SHEET             You may not have any metal on your body including hair pins and piercings  Do not wear jewelry, make-up.  Do not wear lotions, powders, or perfumes. No  Deodorant is to be worn.   Men may shave face and neck.  Do not bring valuables to the hospital. [place].  Contacts, dentures or bridgework may not be worn into surgery.  Leave suitcase in the car. After surgery it may be brought to your room.  For patients admitted to the hospital, checkout time is 11:00 AM the day of discharge.   Please read over the following fact sheets that you were given: [facility] Preparing for surgery sheet, Call Zelphia Cairo RN pre op nurse if needed [phone]    [place].  PATIENT SIGNATURE___________________________________________  NURSE SIGNATURE_____________________________________________

## 2013-10-22 NOTE — Progress Notes (Signed)
EKG 07-27-13 EPIC CHEST XRAY 2 VIEW 07-27-13 EPIC

## 2013-10-26 ENCOUNTER — Encounter (HOSPITAL_COMMUNITY)
Admission: RE | Admit: 2013-10-26 | Discharge: 2013-10-26 | Disposition: A | Discharge status: Home / Self Care | Payer: BC Managed Care – PPO | Source: Ambulatory Visit | Attending: General Surgery | Admitting: General Surgery

## 2013-10-26 ENCOUNTER — Encounter (HOSPITAL_COMMUNITY): Payer: Self-pay

## 2013-10-26 DIAGNOSIS — Z01812 Encounter for preprocedural laboratory examination: Secondary | ICD-10-CM | POA: Insufficient documentation

## 2013-10-26 LAB — CBC WITH DIFFERENTIAL/PLATELET
Basophils Absolute: 0.1 10*3/uL (ref 0.0–0.1)
Basophils Relative: 1 % (ref 0–1)
Eosinophils Absolute: 0.3 10*3/uL (ref 0.0–0.7)
Eosinophils Relative: 3 % (ref 0–5)
HCT: 41.9 % (ref 36.0–46.0)
Hemoglobin: 14 g/dL (ref 12.0–15.0)
LYMPHS ABS: 2.5 10*3/uL (ref 0.7–4.0)
Lymphocytes Relative: 28 % (ref 12–46)
MCH: 29.5 pg (ref 26.0–34.0)
MCHC: 33.4 g/dL (ref 30.0–36.0)
MCV: 88.4 fL (ref 78.0–100.0)
Monocytes Absolute: 0.7 10*3/uL (ref 0.1–1.0)
Monocytes Relative: 8 % (ref 3–12)
NEUTROS PCT: 60 % (ref 43–77)
Neutro Abs: 5.3 10*3/uL (ref 1.7–7.7)
Platelets: 384 10*3/uL (ref 150–400)
RBC: 4.74 MIL/uL (ref 3.87–5.11)
RDW: 13.1 % (ref 11.5–15.5)
WBC: 8.8 10*3/uL (ref 4.0–10.5)

## 2013-10-26 LAB — COMPREHENSIVE METABOLIC PANEL
ALBUMIN: 4 g/dL (ref 3.5–5.2)
ALK PHOS: 86 U/L (ref 39–117)
ALT: 25 U/L (ref 0–35)
AST: 21 U/L (ref 0–37)
BUN: 11 mg/dL (ref 6–23)
CO2: 24 mEq/L (ref 19–32)
Calcium: 9.3 mg/dL (ref 8.4–10.5)
Chloride: 101 mEq/L (ref 96–112)
Creatinine, Ser: 0.71 mg/dL (ref 0.50–1.10)
GFR calc non Af Amer: >90 mL/min (ref >90)
GLUCOSE: 109 mg/dL — AB (ref 70–99)
Potassium: 4.2 mEq/L (ref 3.7–5.3)
Sodium: 138 mEq/L (ref 137–147)
Total Bilirubin: 0.2 mg/dL — ABNORMAL LOW (ref 0.3–1.2)
Total Protein: 7.5 g/dL (ref 6.0–8.3)

## 2013-10-26 LAB — HCG, SERUM, QUALITATIVE: Preg, Serum: NEGATIVE

## 2013-10-28 ENCOUNTER — Encounter: Payer: BC Managed Care – PPO | Attending: General Surgery

## 2013-10-28 ENCOUNTER — Encounter (INDEPENDENT_AMBULATORY_CARE_PROVIDER_SITE_OTHER): Payer: Self-pay | Admitting: General Surgery

## 2013-10-28 DIAGNOSIS — Z713 Dietary counseling and surveillance: Secondary | ICD-10-CM | POA: Insufficient documentation

## 2013-10-28 NOTE — Progress Notes (Signed)
  Pre-Operative Nutrition Class:  Appt start time: 830   End time:  1030.  Patient was seen on 10/28/2013 for Pre-Operative Bariatric Surgery Education [facility].   Surgery date: 11/02/2013 Surgery type: RYGB [facility] weight at [facility]: 291 on 05/24/13 Weight today: 279 lbs  TANITA  BODY COMP RESULTS  10/28/2013   BMI (kg/m^2) 42.4   Fat Mass (lbs) 151   Fat Free Mass (lbs) 128   Total Body Water (lbs) 93.5   Samples given per MNT protocol. Patient educated on appropriate usage: Celebrate Multivitamin (grape)  Lot #: [id number]  Exp: 11/2014   Bariactiv Calcium Citrate (berry)  Lot #: [id number]  Exp: 01/2015   Premier protein shake (vanilla)  Lot #: [id number]  Exp: 05/2014   Renee Pain Protein Powder (chocolate)  Lot #: [id number]  Exp: 11/2014   The following the learning objectives were met by the patient during this course:  Identify Pre-Op Dietary Goals and will begin 2 weeks pre-operatively  Identify appropriate sources of fluids and proteins   State protein recommendations and appropriate sources pre and post-operatively  Identify Post-Operative Dietary Goals and will follow for 2 weeks post-operatively  Identify appropriate multivitamin and calcium sources  Describe the need for physical activity post-operatively and will follow MD recommendations  State when to call healthcare provider regarding medication questions or post-operative complications  Handouts given during class include:  Pre-Op Bariatric Surgery Diet Handout  Protein Shake Handout  Post-Op Bariatric Surgery Nutrition Handout  BELT Program Information Flyer  Support Group Information Flyer  [facility] Bariatric Supplements Price List  Follow-Up Plan: Patient will follow-up at [facility] 2 weeks post operatively for diet advancement per MD.

## 2013-11-02 ENCOUNTER — Inpatient Hospital Stay (HOSPITAL_COMMUNITY)
Admission: RE | Admit: 2013-11-02 | Discharge: 2013-11-04 | DRG: 621 | Disposition: A | Discharge status: Home / Self Care | Payer: BC Managed Care – PPO | Source: Ambulatory Visit | Attending: General Surgery | Admitting: General Surgery

## 2013-11-02 ENCOUNTER — Inpatient Hospital Stay (HOSPITAL_COMMUNITY): Payer: BC Managed Care – PPO | Admitting: Anesthesiology

## 2013-11-02 ENCOUNTER — Encounter (HOSPITAL_COMMUNITY): Payer: BC Managed Care – PPO | Admitting: Anesthesiology

## 2013-11-02 ENCOUNTER — Encounter (HOSPITAL_COMMUNITY): Payer: Self-pay | Admitting: *Deleted

## 2013-11-02 ENCOUNTER — Encounter (HOSPITAL_COMMUNITY)
Admission: RE | Disposition: A | Discharge status: Home / Self Care | Payer: Self-pay | Source: Ambulatory Visit | Attending: General Surgery

## 2013-11-02 DIAGNOSIS — Z01812 Encounter for preprocedural laboratory examination: Secondary | ICD-10-CM

## 2013-11-02 DIAGNOSIS — F411 Generalized anxiety disorder: Secondary | ICD-10-CM | POA: Diagnosis present

## 2013-11-02 DIAGNOSIS — R339 Retention of urine, unspecified: Secondary | ICD-10-CM | POA: Diagnosis not present

## 2013-11-02 DIAGNOSIS — Z86718 Personal history of other venous thrombosis and embolism: Secondary | ICD-10-CM

## 2013-11-02 DIAGNOSIS — F329 Major depressive disorder, single episode, unspecified: Secondary | ICD-10-CM | POA: Diagnosis present

## 2013-11-02 DIAGNOSIS — Z9884 Bariatric surgery status: Secondary | ICD-10-CM

## 2013-11-02 DIAGNOSIS — M1990 Unspecified osteoarthritis, unspecified site: Secondary | ICD-10-CM

## 2013-11-02 DIAGNOSIS — E1165 Type 2 diabetes mellitus with hyperglycemia: Secondary | ICD-10-CM

## 2013-11-02 DIAGNOSIS — Z8614 Personal history of Methicillin resistant Staphylococcus aureus infection: Secondary | ICD-10-CM

## 2013-11-02 DIAGNOSIS — M1710 Unilateral primary osteoarthritis, unspecified knee: Secondary | ICD-10-CM | POA: Diagnosis present

## 2013-11-02 DIAGNOSIS — Z881 Allergy status to other antibiotic agents status: Secondary | ICD-10-CM

## 2013-11-02 DIAGNOSIS — Z79899 Other long term (current) drug therapy: Secondary | ICD-10-CM

## 2013-11-02 DIAGNOSIS — F3289 Other specified depressive episodes: Secondary | ICD-10-CM | POA: Diagnosis present

## 2013-11-02 DIAGNOSIS — E785 Hyperlipidemia, unspecified: Secondary | ICD-10-CM

## 2013-11-02 DIAGNOSIS — R12 Heartburn: Secondary | ICD-10-CM | POA: Diagnosis not present

## 2013-11-02 DIAGNOSIS — IMO0001 Reserved for inherently not codable concepts without codable children: Secondary | ICD-10-CM | POA: Diagnosis present

## 2013-11-02 DIAGNOSIS — Z8632 Personal history of gestational diabetes: Secondary | ICD-10-CM

## 2013-11-02 DIAGNOSIS — Z6841 Body Mass Index (BMI) 40.0 and over, adult: Secondary | ICD-10-CM

## 2013-11-02 DIAGNOSIS — Z833 Family history of diabetes mellitus: Secondary | ICD-10-CM

## 2013-11-02 HISTORY — PX: GASTRIC ROUX-EN-Y: SHX5262

## 2013-11-02 LAB — GLUCOSE, CAPILLARY
GLUCOSE-CAPILLARY: 115 mg/dL — AB (ref 70–99)
Glucose-Capillary: 124 mg/dL — ABNORMAL HIGH (ref 70–99)
Glucose-Capillary: 176 mg/dL — ABNORMAL HIGH (ref 70–99)

## 2013-11-02 LAB — HEMOGLOBIN AND HEMATOCRIT, BLOOD
HCT: 40.5 % (ref 36.0–46.0)
HEMOGLOBIN: 13.4 g/dL (ref 12.0–15.0)

## 2013-11-02 SURGERY — LAPAROSCOPIC ROUX-EN-Y GASTRIC BYPASS WITH UPPER ENDOSCOPY
Anesthesia: General

## 2013-11-02 MED ORDER — DIPHENHYDRAMINE HCL 50 MG/ML IJ SOLN
INTRAMUSCULAR | Status: AC
  Filled 2013-11-02: qty 1

## 2013-11-02 MED ORDER — TISSEEL VH 10 ML EX KIT
PACK | CUTANEOUS | Status: AC
  Filled 2013-11-02: qty 2

## 2013-11-02 MED ORDER — FENTANYL CITRATE 0.05 MG/ML IJ SOLN
INTRAMUSCULAR | Status: DC | PRN
  Administered 2013-11-02 (×3): 100 ug via INTRAVENOUS
  Administered 2013-11-02 (×2): 50 ug via INTRAVENOUS
  Administered 2013-11-02: 100 ug via INTRAVENOUS
  Administered 2013-11-02 (×2): 50 ug via INTRAVENOUS

## 2013-11-02 MED ORDER — GLYCOPYRROLATE 0.2 MG/ML IJ SOLN
INTRAMUSCULAR | Status: AC
Start: 2013-11-02 — End: 2013-11-02
  Filled 2013-11-02: qty 3

## 2013-11-02 MED ORDER — 0.9 % SODIUM CHLORIDE (POUR BTL) OPTIME
TOPICAL | Status: DC | PRN
  Administered 2013-11-02: 1000 mL

## 2013-11-02 MED ORDER — LABETALOL HCL 5 MG/ML IV SOLN
INTRAVENOUS | Status: AC
  Filled 2013-11-02: qty 4

## 2013-11-02 MED ORDER — MORPHINE SULFATE 2 MG/ML IJ SOLN
INTRAMUSCULAR | Status: AC
  Filled 2013-11-02: qty 1

## 2013-11-02 MED ORDER — UNJURY VANILLA POWDER: 2.0000 [oz_av] | Freq: Four times a day (QID) | ORAL | Status: DC

## 2013-11-02 MED ORDER — ENOXAPARIN SODIUM 40 MG/0.4ML ~~LOC~~ SOLN
40.0000 mg | Freq: Two times a day (BID) | SUBCUTANEOUS | Status: DC
  Administered 2013-11-03 – 2013-11-04 (×3): 40 mg via SUBCUTANEOUS
  Filled 2013-11-02 (×6): qty 0.4

## 2013-11-02 MED ORDER — BUPIVACAINE-EPINEPHRINE 0.25% -1:200000 IJ SOLN
INTRAMUSCULAR | Status: AC
  Filled 2013-11-02: qty 1

## 2013-11-02 MED ORDER — GLYCOPYRROLATE 0.2 MG/ML IJ SOLN
INTRAMUSCULAR | Status: DC | PRN
  Administered 2013-11-02: .8 mg via INTRAVENOUS

## 2013-11-02 MED ORDER — LABETALOL HCL 5 MG/ML IV SOLN
INTRAVENOUS | Status: DC | PRN
  Administered 2013-11-02 (×4): 2.5 mg via INTRAVENOUS

## 2013-11-02 MED ORDER — DEXTROSE 5 % IV SOLN
INTRAVENOUS | Status: AC
  Filled 2013-11-02 (×2): qty 1

## 2013-11-02 MED ORDER — SUCCINYLCHOLINE CHLORIDE 20 MG/ML IJ SOLN
INTRAMUSCULAR | Status: DC | PRN
  Administered 2013-11-02: 100 mg via INTRAVENOUS

## 2013-11-02 MED ORDER — POTASSIUM CHLORIDE IN NACL 20-0.45 MEQ/L-% IV SOLN
INTRAVENOUS | Status: DC
  Administered 2013-11-02: 125 mL via INTRAVENOUS
  Administered 2013-11-03 (×2): via INTRAVENOUS
  Administered 2013-11-03: 125 mL via INTRAVENOUS
  Administered 2013-11-04: 05:00:00 via INTRAVENOUS
  Filled 2013-11-02 (×11): qty 1000

## 2013-11-02 MED ORDER — NEOSTIGMINE METHYLSULFATE 1 MG/ML IJ SOLN
INTRAMUSCULAR | Status: AC
Start: 2013-11-02 — End: 2013-11-02
  Filled 2013-11-02: qty 10

## 2013-11-02 MED ORDER — FENTANYL CITRATE 0.05 MG/ML IJ SOLN
INTRAMUSCULAR | Status: AC
Start: 2013-11-02 — End: 2013-11-02
  Filled 2013-11-02: qty 5

## 2013-11-02 MED ORDER — MIDAZOLAM HCL 2 MG/2ML IJ SOLN
INTRAMUSCULAR | Status: AC
  Filled 2013-11-02: qty 2

## 2013-11-02 MED ORDER — FENTANYL CITRATE 0.05 MG/ML IJ SOLN
INTRAMUSCULAR | Status: AC
  Filled 2013-11-02: qty 2

## 2013-11-02 MED ORDER — ONDANSETRON HCL 4 MG/2ML IJ SOLN
INTRAMUSCULAR | Status: AC
  Filled 2013-11-02: qty 2

## 2013-11-02 MED ORDER — HYDROMORPHONE HCL PF 2 MG/ML IJ SOLN
INTRAMUSCULAR | Status: AC
  Filled 2013-11-02: qty 1

## 2013-11-02 MED ORDER — CISATRACURIUM BESYLATE 20 MG/10ML IV SOLN
INTRAVENOUS | Status: AC
  Filled 2013-11-02: qty 10

## 2013-11-02 MED ORDER — NEOSTIGMINE METHYLSULFATE 1 MG/ML IJ SOLN
INTRAMUSCULAR | Status: DC | PRN
  Administered 2013-11-02: 5 mg via INTRAVENOUS

## 2013-11-02 MED ORDER — FENTANYL CITRATE 0.05 MG/ML IJ SOLN
INTRAMUSCULAR | Status: AC
  Filled 2013-11-02: qty 5

## 2013-11-02 MED ORDER — ONDANSETRON HCL 4 MG/2ML IJ SOLN
INTRAMUSCULAR | Status: DC | PRN
  Administered 2013-11-02: 4 mg via INTRAVENOUS

## 2013-11-02 MED ORDER — LACTATED RINGERS IV SOLN
INTRAVENOUS | Status: DC
Start: 2013-11-02 — End: 2013-11-02
  Administered 2013-11-02 (×2): via INTRAVENOUS
  Administered 2013-11-02: 1000 mL via INTRAVENOUS
  Administered 2013-11-02: 12:00:00 via INTRAVENOUS

## 2013-11-02 MED ORDER — ACETAMINOPHEN 10 MG/ML IV SOLN
1000.0000 mg | Freq: Once | INTRAVENOUS | Status: AC
  Administered 2013-11-02: 1000 mg via INTRAVENOUS
  Filled 2013-11-02: qty 100

## 2013-11-02 MED ORDER — DEXTROSE 5 % IV SOLN
2.0000 g | Freq: Once | INTRAVENOUS | Status: AC
  Administered 2013-11-02: 2 g via INTRAVENOUS
  Filled 2013-11-02: qty 2

## 2013-11-02 MED ORDER — PROPOFOL 10 MG/ML IV BOLUS
INTRAVENOUS | Status: DC | PRN
  Administered 2013-11-02: 200 mg via INTRAVENOUS

## 2013-11-02 MED ORDER — EVICEL 5 ML EX KIT
PACK | CUTANEOUS | Status: DC | PRN
  Administered 2013-11-02: 2

## 2013-11-02 MED ORDER — BUPIVACAINE-EPINEPHRINE 0.25% -1:200000 IJ SOLN
INTRAMUSCULAR | Status: DC | PRN
  Administered 2013-11-02: 50 mL

## 2013-11-02 MED ORDER — ACETAMINOPHEN 160 MG/5ML PO SOLN: 325.0000 mg | ORAL | Status: DC | PRN

## 2013-11-02 MED ORDER — HEPARIN SODIUM (PORCINE) 5000 UNIT/ML IJ SOLN
5000.0000 [IU] | INTRAMUSCULAR | Status: AC
  Administered 2013-11-02: 5000 [IU] via SUBCUTANEOUS
  Filled 2013-11-02: qty 1

## 2013-11-02 MED ORDER — CISATRACURIUM BESYLATE (PF) 10 MG/5ML IV SOLN
INTRAVENOUS | Status: DC | PRN
  Administered 2013-11-02: 2 mg via INTRAVENOUS
  Administered 2013-11-02: 10 mg via INTRAVENOUS
  Administered 2013-11-02: 4 mg via INTRAVENOUS
  Administered 2013-11-02: 2 mg via INTRAVENOUS
  Administered 2013-11-02: 4 mg via INTRAVENOUS
  Administered 2013-11-02: 2 mg via INTRAVENOUS

## 2013-11-02 MED ORDER — LIDOCAINE HCL (CARDIAC) 20 MG/ML IV SOLN
INTRAVENOUS | Status: AC
  Filled 2013-11-02: qty 5

## 2013-11-02 MED ORDER — UNJURY CHOCOLATE CLASSIC POWDER
2.0000 [oz_av] | Freq: Four times a day (QID) | ORAL | Status: DC
  Administered 2013-11-04 (×2): 2 [oz_av] via ORAL

## 2013-11-02 MED ORDER — STERILE WATER FOR IRRIGATION IR SOLN
Status: DC | PRN
  Administered 2013-11-02: 1500 mL

## 2013-11-02 MED ORDER — HYDROMORPHONE HCL PF 1 MG/ML IJ SOLN
INTRAMUSCULAR | Status: AC
  Filled 2013-11-02: qty 1

## 2013-11-02 MED ORDER — OXYCODONE HCL 5 MG/5ML PO SOLN
5.0000 mg | ORAL | Status: DC | PRN
  Administered 2013-11-03 – 2013-11-04 (×6): 10 mg via ORAL
  Filled 2013-11-02: qty 50
  Filled 2013-11-02 (×5): qty 10

## 2013-11-02 MED ORDER — PANTOPRAZOLE SODIUM 40 MG IV SOLR
40.0000 mg | INTRAVENOUS | Status: DC
  Administered 2013-11-02 – 2013-11-03 (×2): 40 mg via INTRAVENOUS
  Filled 2013-11-02 (×3): qty 40

## 2013-11-02 MED ORDER — CHLORHEXIDINE GLUCONATE CLOTH 2 % EX PADS: 6.0000 | MEDICATED_PAD | Freq: Once | CUTANEOUS | Status: AC

## 2013-11-02 MED ORDER — DEXTROSE 5 % IV SOLN
2.0000 g | INTRAVENOUS | Status: AC
  Administered 2013-11-02: 2 g via INTRAVENOUS
  Filled 2013-11-02: qty 2

## 2013-11-02 MED ORDER — HYDROMORPHONE HCL PF 1 MG/ML IJ SOLN
0.2500 mg | INTRAMUSCULAR | Status: DC | PRN
  Administered 2013-11-02 (×4): 0.5 mg via INTRAVENOUS

## 2013-11-02 MED ORDER — HYDROMORPHONE HCL PF 1 MG/ML IJ SOLN
INTRAMUSCULAR | Status: DC | PRN
  Administered 2013-11-02 (×2): 0.5 mg via INTRAVENOUS
  Administered 2013-11-02: 1 mg via INTRAVENOUS

## 2013-11-02 MED ORDER — MEPERIDINE HCL 50 MG/ML IJ SOLN: 6.2500 mg | INTRAMUSCULAR | Status: DC | PRN

## 2013-11-02 MED ORDER — DIPHENHYDRAMINE HCL 50 MG/ML IJ SOLN
12.5000 mg | INTRAMUSCULAR | Status: AC
  Administered 2013-11-02 (×2): 12.5 mg via INTRAVENOUS

## 2013-11-02 MED ORDER — PROMETHAZINE HCL 25 MG/ML IJ SOLN: 6.2500 mg | INTRAMUSCULAR | Status: DC | PRN

## 2013-11-02 MED ORDER — ACETAMINOPHEN 10 MG/ML IV SOLN
1000.0000 mg | Freq: Four times a day (QID) | INTRAVENOUS | Status: AC
  Administered 2013-11-02 – 2013-11-03 (×4): 1000 mg via INTRAVENOUS
  Filled 2013-11-02 (×4): qty 100

## 2013-11-02 MED ORDER — INSULIN ASPART 100 UNIT/ML ~~LOC~~ SOLN
0.0000 [IU] | SUBCUTANEOUS | Status: DC
  Administered 2013-11-03: 1 [IU] via SUBCUTANEOUS

## 2013-11-02 MED ORDER — UNJURY CHICKEN SOUP POWDER: 2.0000 [oz_av] | Freq: Four times a day (QID) | ORAL | Status: DC

## 2013-11-02 MED ORDER — PROPOFOL 10 MG/ML IV BOLUS
INTRAVENOUS | Status: AC
  Filled 2013-11-02: qty 20

## 2013-11-02 MED ORDER — MORPHINE SULFATE 2 MG/ML IJ SOLN
2.0000 mg | INTRAMUSCULAR | Status: DC | PRN
  Administered 2013-11-02: 4 mg via INTRAVENOUS
  Administered 2013-11-02 – 2013-11-03 (×2): 2 mg via INTRAVENOUS
  Administered 2013-11-03: 4 mg via INTRAVENOUS
  Administered 2013-11-03: 6 mg via INTRAVENOUS
  Filled 2013-11-02 (×2): qty 1
  Filled 2013-11-02: qty 3
  Filled 2013-11-02 (×2): qty 2

## 2013-11-02 MED ORDER — LACTATED RINGERS IR SOLN
Status: DC | PRN
  Administered 2013-11-02: 1000 mL

## 2013-11-02 MED ORDER — MIDAZOLAM HCL 5 MG/5ML IJ SOLN
INTRAMUSCULAR | Status: DC | PRN
  Administered 2013-11-02 (×2): 1 mg via INTRAVENOUS

## 2013-11-02 MED ORDER — GLYCOPYRROLATE 0.2 MG/ML IJ SOLN
INTRAMUSCULAR | Status: AC
  Filled 2013-11-02: qty 1

## 2013-11-02 MED ORDER — LIDOCAINE HCL (PF) 2 % IJ SOLN
INTRAMUSCULAR | Status: DC | PRN
  Administered 2013-11-02: 75 mg via INTRADERMAL

## 2013-11-02 MED ORDER — ONDANSETRON HCL 4 MG/2ML IJ SOLN
4.0000 mg | INTRAMUSCULAR | Status: DC | PRN
  Administered 2013-11-02 – 2013-11-03 (×2): 4 mg via INTRAVENOUS
  Filled 2013-11-02 (×2): qty 2

## 2013-11-02 SURGICAL SUPPLY — 58 items
APPLICATOR COTTON TIP 6IN STRL (MISCELLANEOUS) IMPLANT
BLADE SURG 15 STRL LF DISP TIS (BLADE) IMPLANT
BLADE SURG 15 STRL SS (BLADE)
BLADE SURG SZ11 CARB STEEL (BLADE) ×3 IMPLANT
CABLE HIGH FREQUENCY MONO STRZ (ELECTRODE) ×3 IMPLANT
CHLORAPREP W/TINT 26ML (MISCELLANEOUS) ×6 IMPLANT
CLIP SUT LAPRA TY ABSORB (SUTURE) ×6 IMPLANT
CUTTER LINEAR ENDO ART 45 ETS (STAPLE) ×3 IMPLANT
DERMABOND ADVANCED (GAUZE/BANDAGES/DRESSINGS) ×4
DERMABOND ADVANCED .7 DNX12 (GAUZE/BANDAGES/DRESSINGS) ×2 IMPLANT
DEVICE SUTURE ENDOST 10MM (ENDOMECHANICALS) ×3 IMPLANT
DRAIN PENROSE 18X1/4 LTX STRL (WOUND CARE) ×3 IMPLANT
DRAPE CAMERA CLOSED 9X96 (DRAPES) ×3 IMPLANT
DRAPE WARM FLUID 44X44 (DRAPE) ×3 IMPLANT
ELECT REM PT RETURN 9FT ADLT (ELECTROSURGICAL) ×3
ELECTRODE REM PT RTRN 9FT ADLT (ELECTROSURGICAL) ×1 IMPLANT
GAUZE SPONGE 4X4 16PLY XRAY LF (GAUZE/BANDAGES/DRESSINGS) ×3 IMPLANT
GLOVE BIOGEL M STRL SZ7.5 (GLOVE) IMPLANT
GOWN STRL REUS W/TWL XL LVL3 (GOWN DISPOSABLE) ×15 IMPLANT
HOVERMATT SINGLE USE (MISCELLANEOUS) ×3 IMPLANT
KIT BASIN OR (CUSTOM PROCEDURE TRAY) ×3 IMPLANT
KIT GASTRIC LAVAGE 34FR ADT (SET/KITS/TRAYS/PACK) ×3 IMPLANT
MARKER SKIN DUAL TIP RULER LAB (MISCELLANEOUS) ×3 IMPLANT
NEEDLE SPNL 22GX3.5 QUINCKE BK (NEEDLE) ×3 IMPLANT
PACK CARDIOVASCULAR III (CUSTOM PROCEDURE TRAY) ×3 IMPLANT
RELOAD 45 VASCULAR/THIN (ENDOMECHANICALS) ×3 IMPLANT
RELOAD BLUE (STAPLE) ×6 IMPLANT
RELOAD ENDO STITCH 2.0 (ENDOMECHANICALS) ×26
RELOAD GOLD (STAPLE) ×3 IMPLANT
RELOAD STAPLE TA45 3.5 REG BLU (ENDOMECHANICALS) ×6 IMPLANT
RELOAD WHITE ECR60W (STAPLE) ×3 IMPLANT
SCISSORS LAP 5X35 DISP (ENDOMECHANICALS) ×3 IMPLANT
SEALANT SURGICAL APPL DUAL CAN (MISCELLANEOUS) ×3 IMPLANT
SET IRRIG TUBING LAPAROSCOPIC (IRRIGATION / IRRIGATOR) ×3 IMPLANT
SHEARS HARMONIC ACE PLUS 45CM (MISCELLANEOUS) ×3 IMPLANT
SLEEVE ADV FIXATION 12X100MM (TROCAR) ×6 IMPLANT
SOLUTION ANTI FOG 6CC (MISCELLANEOUS) ×3 IMPLANT
SPONGE GAUZE 4X4 12PLY (GAUZE/BANDAGES/DRESSINGS) IMPLANT
STAPLE ECHEON FLEX 60 POW ENDO (STAPLE) ×3 IMPLANT
STAPLER VISISTAT 35W (STAPLE) IMPLANT
SUT DVC SILK 2.0X39 (SUTURE) ×6 IMPLANT
SUT MNCRL AB 4-0 PS2 18 (SUTURE) ×6 IMPLANT
SUT RELOAD ENDO STITCH 2 48X1 (ENDOMECHANICALS) ×9
SUT RELOAD ENDO STITCH 2.0 (ENDOMECHANICALS) ×4
SUT VIC AB 2-0 SH 27 (SUTURE) ×8
SUT VIC AB 2-0 SH 27X BRD (SUTURE) ×4 IMPLANT
SUTURE RELOAD END STTCH 2 48X1 (ENDOMECHANICALS) ×9 IMPLANT
SUTURE RELOAD ENDO STITCH 2.0 (ENDOMECHANICALS) ×4 IMPLANT
SYR 20CC LL (SYRINGE) ×6 IMPLANT
TOWEL OR 17X26 10 PK STRL BLUE (TOWEL DISPOSABLE) ×3 IMPLANT
TOWEL OR NON WOVEN STRL DISP B (DISPOSABLE) ×3 IMPLANT
TRAY FOLEY CATH 14FRSI W/METER (CATHETERS) ×3 IMPLANT
TROCAR ADV FIXATION 12X100MM (TROCAR) ×3 IMPLANT
TROCAR ADV FIXATION 5X100MM (TROCAR) ×3 IMPLANT
TROCAR BLADELESS OPT 5 100 (ENDOMECHANICALS) ×3 IMPLANT
TROCAR XCEL 12X100 BLDLESS (ENDOMECHANICALS) ×3 IMPLANT
TUBING ENDO SMARTCAP PENTAX (MISCELLANEOUS) ×3 IMPLANT
TUBING FILTER THERMOFLATOR (ELECTROSURGICAL) ×3 IMPLANT

## 2013-11-02 NOTE — Op Note (Signed)
[name] [id number] [birth year]. 11/02/2013  Preoperative diagnosis:  1. Morbid obesity (BMI 43.8  2. Hyperlipidemia 3. Diabetes Mellitus 2 4. Degenerative Joint Disease Right Knee  Postoperative  diagnosis:  1. same  Surgical procedure: Laparoscopic Roux-en-Y gastric bypass (ante-colic, ante-gastric); upper endoscopy  Surgeon: Gayland Curry, M.D. FACS  Asst.: Alphonsa Overall, MD FACS  Anesthesia: General plus 0.25% marcaine with epi  Complications: None   EBL: Minimal   Drains: None   Disposition: PACU in good condition   Indications for procedure: 43yo WF with morbid obesity who has been unsuccessful at sustained weight loss. The patient's comorbidities are listed above. We discussed the risk and benefits of surgery including but not limited to anesthesia risk, bleeding, infection, blood clot formation, anastomotic leak, anastomotic stricture, ulcer formation, death, respiratory complications, intestinal blockage, internal hernia, gallstone formation, vitamin and nutritional deficiencies, injury to surrounding structures, failure to lose weight and mood changes.   Description of procedure: Patient is brought to the operating room and general anesthesia induced. The patient had received preoperative broad-spectrum IV antibiotics and subcutaneous heparin. The abdomen was widely sterilely prepped with Chloraprep and draped. Patient timeout was performed and correct patient and procedure confirmed. Access was obtained with a 12 mm Optiview trocar in the left upper quadrant and pneumoperitoneum established without difficulty. Under direct vision 12 mm trocars were placed laterally in the right upper quadrant, right upper quadrant midclavicular line, and to the left and above the umbilicus for the camera port. A 5 mm trocar was placed laterally in the left upper quadrant.  The omentum was brought into the upper abdomen and the transverse mesocolon elevated and the ligament of Treitz  clearly identified. A 40 cm biliopancreatic limb was then carefully measured from the ligament of Treitz. The small intestine was divided at this point with a single firing of the white load linear stapler. A Penrose drain was sutured to the end of the Roux-en-Y limb for later identification. A 100 cm Roux-en-Y limb was then carefully measured. At this point a side-to-side anastomosis was created between the Roux limb and the end of the biliopancreatic limb. This was accomplished with a single firing of the 45 mm white load linear stapler. The common enterotomy was closed with a running 2-0 Vicryl begun at either end of the enterotomy and tied centrally. Tisseel tissue sealant was placed over the anastomosis. The mesenteric defect was then closed with running 2-0 silk. The omentum was then divided with the harmonic scalpel up towards the transverse colon to allow mobility of the Roux limb toward the gastric pouch. The patient was then placed in steep reversed Trendelenburg. Through a 5 mm subxiphoid site the [facility] retractor was placed and the left lobe of the liver elevated with excellent exposure of the upper stomach and hiatus. The angle of Hiss was then mobilized with the harmonic scalpel. A 4 cm gastric pouch was then carefully measured along the lesser curve of the stomach. Dissection was carried along the lesser curve at this point with the Harmonic scalpel working carefully back toward the lesser sac at right angles to the lesser curve. The free lesser sac was then entered. After being sure all tubes were removed from the stomach an initial firing of the gold load 60 mm linear stapler was fired at right angles across the lesser curve for about 4 cm. The gastric pouch was further mobilized posteriorly and then the pouch was completed with 2 further firings of the 60 mm blue load linear stapler  and 1 final firing of a blue load 110mm linear stapler up through the previously dissected angle of His. It was  ensured that the pouch was completely mobilized away from the gastric remnant. This created a nice tubular 4-5 cm gastric pouch. The Roux limb was then brought up in an antecolic fashion with the candycane facing to the patient's left without undue tension. The gastrojejunostomy was created with an initial posterior row of 2-0 Vicryl between the Roux limb and the staple line of the gastric pouch. Enterotomies were then made in the gastric pouch and the Roux limb with the harmonic scalpel and at approximately 2-2-1/2 cm anastomosis was created with a single firing of the 35mm blue load linear stapler. The staple line was inspected and was intact without bleeding. The common enterotomy was then closed with running 2-0 Vicryl begun at either end and tied centrally. An additional interrupted 2-0 vicryl was placed centrally. The Ewall tube was then easily passed through the anastomosis and an outer anterior layer of running 2-0 Vicryl was placed. The Ewald tube was removed. With the outlet of the gastrojejunostomy clamped and under saline irrigation the assistant performed upper endoscopy and with the gastric pouch tensely distended with air-there was no evidence of leak on this test. The pouch was desufflated. The Terance Hart defect was closed with running 2-0 silk. The abdomen was inspected for any evidence of bleeding or bowel injury and everything looked fine. The Nathanson retractor was removed under direct vision after coating the anastomosis with Tisseel tissue sealant. All CO2 was evacuated and trochars removed. Skin incisions were closed with 4-0 monocryl in a subcuticular fashion followed by Dermabond. Sponge needle and instrument counts were correct. The patient was taken to the PACU in good condition.    Leighton Ruff. Redmond Pulling, MD, FACS General, Bariatric, & Minimally Invasive Surgery [facility], [place]

## 2013-11-02 NOTE — Progress Notes (Signed)
Name:  [name] MRN: [id number] Date of Surgery: 11/02/2013  Preop Diagnosis:  Morbid Obesity, S/P RYGB  Postop Diagnosis:  Morbid Obesity (weight - 288, BMI 43.8) , S/P RYGB  Procedure:  Upper endoscopy  (Intraoperative)  Surgeon:  Alphonsa Overall, M.D.  Anesthesia:  GET  Indications for procedure: [name] is a 43 y.o. female whose primary care physician is Lucretia Kern., DO and has completed a Roux-en-Y gastric bypass today by Dr. Redmond Pulling.  I am doing an intraoperative upper endoscopy to evaluate the gastric pouch and the gastro-jejunal anastomosis.  Operative Note: The patient is under general anesthesia.  Dr. Redmond Pulling is laparoscoping the patient while I do an upper endoscopy to evaluate the stomach pouch and gastrojejunal anastomosis.  With the patient intubated, I passed the Pentax endoscope without difficulty down the esophagus.  The esophago-gastric junction was at 39 cm.  The gastro-jejunal anastomosis was at 44 cm.  The mucosa of the stomach looked viable and the staple line was intact without bleeding.  The gastro-jejunal anastomosis looked okay.  While I insufflated the stomach pouch with air, Dr. Redmond Pulling clamped off the efferent limb of the jejunum.  He then flooded the upper abdomen with saline to put the gastric pouch and gastro-jejunal anastomosis under saline.  There was no bubbling or evidence of a leak.    The scope was then withdrawn.  The esophagus was unremarkable and the patient tolerated the endoscopy without difficulty.  Alphonsa Overall, MD, [facility] Pager: [phone] Office phone:  [phone]

## 2013-11-02 NOTE — Anesthesia Postprocedure Evaluation (Signed)
Anesthesia Post Note  Patient: [name]  Procedure(s) Performed: Procedure(s) (LRB): LAPAROSCOPIC ROUX-EN-Y GASTRIC BYPASS WITH UPPER ENDOSCOPY (N/A)  Anesthesia type: General  Patient location: PACU  Post pain: Pain level controlled  Post assessment: Post-op Vital signs reviewed  Last Vitals: BP 112/68  Pulse 90  Temp(Src) 36.7 C (Oral)  Resp 17  Ht 5\' 8"  (1.727 m)  Wt 276 lb (125.193 kg)  BMI 41.98 kg/m2  SpO2 96%  Post vital signs: Reviewed  Level of consciousness: sedated  Complications: No apparent anesthesia complications

## 2013-11-02 NOTE — H&P (View-Only) (Signed)
Patient ID: [name], female   DOB: 20-Jan-1971, 43 y.o.   MRN: [id number]  Chief Complaint  Patient presents with  . Bariatric Pre-op    ryngb     HPI [name] is a 43 y.o. female.   HPI 43 year old morbidly obese Caucasian female comes in today for a preoperative appointment. She is currently scheduled to undergo a laparoscopic Roux-en-Y gastric bypass on March 24 at [facility]. I initially met her in November 2014. Her weight at that time was 291 pounds. She denies any significant medical changes since she was initially seen. Her diabetes medication has been changed around. Endocrinologist - Dr Philemon Kingdom.   Past Medical History  Diagnosis Date  . Headache(784.0)     tension or sinus  . Arthritis     right knee  . Depression   . Anxiety   . Medial meniscus tear 12/2012    right knee  . History of MRSA infection prior to 2012    lasted 5-6 yr.  . Dental crowns present   . Jaw snapping     states jaw pops  . Insect bites 12/24/2012    leg  . Complication of anesthesia     has been hard to wake up post-op  . Gestational diabetes 2004  . T2DM (type 2 diabetes mellitus) 05/2013  . DVT of lower extremity (deep venous thrombosis) 2012    LLE  . Acute meniscal tear of knee 09/22/2012    Right knee.    . Chondromalacia 04/30/2013    Seen on MRI as well as during surgery by Dr. Rhona Raider Tricompartmental degenerative changes   . Primary localized osteoarthrosis, lower leg 07/06/2013    Synvisc series started July 06, 2013     Past Surgical History  Procedure Laterality Date  . Cholecystectomy    . Hand surgery Left     X 3 - spider bite and MRSA infection  . Wrist surgery Right   . Tonsillectomy    . Knee arthroscopy Right 12/29/2012    Procedure: RIGHT KNEE ARTHROSCOPY  PARTIAL MEDIAL AND LATERAL MENISECTOMY WITH CHONDROPLASTY;  Surgeon: Hessie Dibble, MD;  Location: [facility];  Service: Orthopedics;  Laterality: Right;     Family History  Problem Relation Age of Onset  . Diabetes Mother   . Kidney disease Mother   . Hypertension Mother   . Cancer Mother     breast cancer  . Cancer Maternal Grandmother   . Diabetes Maternal Grandmother     Social History History  Substance Use Topics  . Smoking status: Never Smoker   . Smokeless tobacco: Never Used  . Alcohol Use: Yes     Comment: 1-2 glasses of wine a few times per month    Allergies  Allergen Reactions  . Sulfa Antibiotics Other (See Comments)    HALLUCINATIONS, FEVER, CHILLS    Current Outpatient Prescriptions  Medication Sig Dispense Refill  . acetaminophen (TYLENOL) 500 MG tablet Take 1,000 mg by mouth every 6 (six) hours as needed for mild pain.      Marland Kitchen alprazolam (XANAX) 2 MG tablet Take 2 mg by mouth 2 (two) times daily as needed for anxiety.       . DULoxetine (CYMBALTA) 60 MG capsule Take 120 mg by mouth every morning.       . minoxidil (ROGAINE) 2 % external solution Apply 1 application topically daily.      Marland Kitchen zolpidem (AMBIEN) 10 MG tablet Take 10 mg by  mouth at bedtime.        No current facility-administered medications for this visit.    Review of Systems Review of Systems  Constitutional: Negative for fever, activity change, appetite change and unexpected weight change.  HENT: Negative for nosebleeds and trouble swallowing.   Eyes: Negative for photophobia and visual disturbance.  Respiratory: Negative for chest tightness and shortness of breath.   Cardiovascular: Negative for chest pain and leg swelling.       Denies CP, SOB, orthopnea, PND, DOE  Gastrointestinal: Negative for nausea, vomiting, abdominal pain, diarrhea and constipation.       Denies reflux  Endocrine:       A1C improved slightly. Recently stopped oral DM agent per endocrinologist for upcoming RYGB.  Genitourinary: Negative for dysuria and difficulty urinating.  Musculoskeletal: Negative for arthralgias.  Skin: Negative for pallor and rash.   Neurological: Negative for dizziness, seizures, facial asymmetry and numbness.       Denies TIA and amaurosis fugax   Hematological: Negative for adenopathy. Does not bruise/bleed easily.       Remote h/o LLE DVT after prolonged car trip - hypercoag workup negative  Psychiatric/Behavioral: Negative for behavioral problems and agitation.    Blood pressure 110/80, pulse 82, temperature 98.3 F (36.8 C), temperature source Oral, resp. rate 16, height _0  (1.727 m), weight 288 lb 3.2 oz (130.727 kg).  Physical Exam Physical Exam  Vitals reviewed. Constitutional: She is oriented to person, place, and time. She appears well-developed and well-nourished. No distress.  HENT:  Head: Normocephalic and atraumatic.  Right Ear: External ear normal.  Left Ear: External ear normal.  Eyes: Conjunctivae are normal. No scleral icterus.  Neck: Normal range of motion. Neck supple. No tracheal deviation present. No thyromegaly present.  Cardiovascular: Normal rate and normal heart sounds.   Pulmonary/Chest: Effort normal and breath sounds normal. No stridor. No respiratory distress. She has no wheezes.  Abdominal: Soft. She exhibits no distension. There is no tenderness. There is no rebound and no guarding.  Well healed trocar sites  Musculoskeletal: She exhibits no edema and no tenderness.  Lymphadenopathy:    She has no cervical adenopathy.  Neurological: She is alert and oriented to person, place, and time. She exhibits normal muscle tone.  Skin: Skin is warm and dry. No rash noted. She is not diaphoretic. No erythema.  Psychiatric: She has a normal mood and affect. Her behavior is normal. Judgment and thought content normal.    Data Reviewed My office note 06/30/13 UGI - wnl cxr - wnl mammo - stable A1C - 8.2 and now 7.8 Negative LLE duplex  Assessment    Morbid obesity BMI 43.82 HPL DM 2 DJD Right knee      Plan    We reviewed her preoperative workup including an upper GI lab  work. She's started her preoperative diet. All of her questions were asked and answered. She was given MiraLAX bowel prep. She was given her postoperative pain medicine prescription of oxycodone elixir at today's appointment. She scheduled to attend her preoperative nutrition class next week. She is encouraged to contact the office should she have any additional questions between now and surgery  Leighton Ruff. Redmond Pulling, MD, FACS General, Bariatric, & Minimally Invasive Surgery [facility], [place]        [facility] M 10/21/2013, 4:51 PM

## 2013-11-02 NOTE — Transfer of Care (Signed)
Immediate Anesthesia Transfer of Care Note  Patient: [name]  Procedure(s) Performed: Procedure(s): LAPAROSCOPIC ROUX-EN-Y GASTRIC BYPASS WITH UPPER ENDOSCOPY (N/A)  Patient Location: PACU  Anesthesia Type:General  Level of Consciousness: awake, alert  and patient cooperative  Airway & Oxygen Therapy: Patient Spontanous Breathing and Patient connected to face mask oxygen  Post-op Assessment: Report given to PACU RN, Post -op Vital signs reviewed and stable and Patient moving all extremities X 4  Post vital signs: Reviewed and stable  Complications: No apparent anesthesia complications

## 2013-11-02 NOTE — Anesthesia Preprocedure Evaluation (Addendum)
Anesthesia Evaluation  Patient identified by MRN, date of birth, ID band Patient awake    Reviewed: Allergy & Precautions, H&P , NPO status , Patient's Chart, lab work & pertinent test results  Airway Mallampati: I TM Distance: >3 FB Neck ROM: Full    Dental  (+) Teeth Intact, Dental Advisory Given   Pulmonary neg pulmonary ROS,  breath sounds clear to auscultation        Cardiovascular + Peripheral Vascular Disease Rhythm:Regular Rate:Normal     Neuro/Psych  Headaches, PSYCHIATRIC DISORDERS Anxiety Depression    GI/Hepatic negative GI ROS, Neg liver ROS,   Endo/Other  diabetesMorbid obesity  Renal/GU negative Renal ROS     Musculoskeletal negative musculoskeletal ROS (+)   Abdominal   Peds  Hematology negative hematology ROS (+)   Anesthesia Other Findings   Reproductive/Obstetrics negative OB ROS                           Anesthesia Physical  Anesthesia Plan  ASA: III  Anesthesia Plan: General   Post-op Pain Management:    Induction: Intravenous  Airway Management Planned: Oral ETT  Additional Equipment:   Intra-op Plan:   Post-operative Plan: Extubation in OR  Informed Consent: I have reviewed the patients History and Physical, chart, labs and discussed the procedure including the risks, benefits and alternatives for the proposed anesthesia with the patient or authorized representative who has indicated his/her understanding and acceptance.   Dental advisory given  Plan Discussed with: CRNA  Anesthesia Plan Comments:         Anesthesia Quick Evaluation

## 2013-11-02 NOTE — Interval H&P Note (Signed)
History and Physical Interval Note:  11/02/2013 10:53 AM  [name]  has presented today for surgery, with the diagnosis of morbid obesity   The various methods of treatment have been discussed with the patient and family. After consideration of risks, benefits and other options for treatment, the patient has consented to  Procedure(s): LAPAROSCOPIC ROUX-EN-Y GASTRIC BYPASS WITH UPPER ENDOSCOPY (N/A) as a surgical intervention .  The patient's history has been reviewed, patient examined, no change in status, stable for surgery.  I have reviewed the patient's chart and labs.  Questions were answered to the patient's satisfaction.    Leighton Ruff. Redmond Pulling, MD, [place], Bariatric, & Minimally Invasive Surgery [facility], [place]   [facility] M

## 2013-11-03 ENCOUNTER — Inpatient Hospital Stay (HOSPITAL_COMMUNITY): Payer: BC Managed Care – PPO

## 2013-11-03 ENCOUNTER — Encounter (HOSPITAL_COMMUNITY): Payer: Self-pay | Admitting: General Surgery

## 2013-11-03 LAB — COMPREHENSIVE METABOLIC PANEL
ALBUMIN: 3.4 g/dL — AB (ref 3.5–5.2)
ALK PHOS: 74 U/L (ref 39–117)
ALT: 51 U/L — ABNORMAL HIGH (ref 0–35)
AST: 48 U/L — ABNORMAL HIGH (ref 0–37)
BUN: 6 mg/dL (ref 6–23)
CO2: 26 mEq/L (ref 19–32)
Calcium: 8.6 mg/dL (ref 8.4–10.5)
Chloride: 101 mEq/L (ref 96–112)
Creatinine, Ser: 0.77 mg/dL (ref 0.50–1.10)
GFR calc Af Amer: >90 mL/min (ref >90)
GFR calc non Af Amer: >90 mL/min (ref >90)
Glucose, Bld: 126 mg/dL — ABNORMAL HIGH (ref 70–99)
POTASSIUM: 4.3 meq/L (ref 3.7–5.3)
Sodium: 139 mEq/L (ref 137–147)
TOTAL PROTEIN: 6.6 g/dL (ref 6.0–8.3)
Total Bilirubin: 0.4 mg/dL (ref 0.3–1.2)

## 2013-11-03 LAB — GLUCOSE, CAPILLARY
GLUCOSE-CAPILLARY: 109 mg/dL — AB (ref 70–99)
GLUCOSE-CAPILLARY: 87 mg/dL (ref 70–99)
GLUCOSE-CAPILLARY: 94 mg/dL (ref 70–99)
GLUCOSE-CAPILLARY: 95 mg/dL (ref 70–99)
Glucose-Capillary: 102 mg/dL — ABNORMAL HIGH (ref 70–99)
Glucose-Capillary: 109 mg/dL — ABNORMAL HIGH (ref 70–99)
Glucose-Capillary: 125 mg/dL — ABNORMAL HIGH (ref 70–99)

## 2013-11-03 LAB — CBC WITH DIFFERENTIAL/PLATELET
BASOS PCT: 0 % (ref 0–1)
Basophils Absolute: 0 10*3/uL (ref 0.0–0.1)
EOS ABS: 0.1 10*3/uL (ref 0.0–0.7)
Eosinophils Relative: 1 % (ref 0–5)
HCT: 40.1 % (ref 36.0–46.0)
HEMOGLOBIN: 12.6 g/dL (ref 12.0–15.0)
Lymphocytes Relative: 13 % (ref 12–46)
Lymphs Abs: 1.7 10*3/uL (ref 0.7–4.0)
MCH: 29 pg (ref 26.0–34.0)
MCHC: 31.4 g/dL (ref 30.0–36.0)
MCV: 92.4 fL (ref 78.0–100.0)
MONOS PCT: 11 % (ref 3–12)
Monocytes Absolute: 1.4 10*3/uL — ABNORMAL HIGH (ref 0.1–1.0)
NEUTROS PCT: 76 % (ref 43–77)
Neutro Abs: 9.8 10*3/uL — ABNORMAL HIGH (ref 1.7–7.7)
PLATELETS: 345 10*3/uL (ref 150–400)
RBC: 4.34 MIL/uL (ref 3.87–5.11)
RDW: 13.7 % (ref 11.5–15.5)
WBC: 13 10*3/uL — ABNORMAL HIGH (ref 4.0–10.5)

## 2013-11-03 LAB — HEMOGLOBIN AND HEMATOCRIT, BLOOD
HEMATOCRIT: 38.3 % (ref 36.0–46.0)
HEMOGLOBIN: 12.5 g/dL (ref 12.0–15.0)

## 2013-11-03 MED ORDER — DULOXETINE HCL 60 MG PO CPEP
120.0000 mg | ORAL_CAPSULE | Freq: Every morning | ORAL | Status: DC
  Administered 2013-11-03 – 2013-11-04 (×2): 120 mg via ORAL
  Filled 2013-11-03 (×2): qty 2

## 2013-11-03 MED ORDER — SIMETHICONE 40 MG/0.6ML PO SUSP
40.0000 mg | Freq: Four times a day (QID) | ORAL | Status: DC | PRN
  Administered 2013-11-03: 40 mg via ORAL
  Filled 2013-11-03 (×2): qty 0.6

## 2013-11-03 MED ORDER — CHLORHEXIDINE GLUCONATE 0.12 % MT SOLN
15.0000 mL | Freq: Two times a day (BID) | OROMUCOSAL | Status: DC
  Administered 2013-11-03 – 2013-11-04 (×3): 15 mL via OROMUCOSAL
  Filled 2013-11-03 (×5): qty 15

## 2013-11-03 MED ORDER — IOHEXOL 300 MG/ML  SOLN
50.0000 mL | Freq: Once | INTRAMUSCULAR | Status: AC | PRN
  Administered 2013-11-03: 50 mL via ORAL

## 2013-11-03 NOTE — Progress Notes (Signed)
1 Day Post-Op  Subjective: C/o band of pain around mid abdomen. Didn't sleep well. Didn't spont urinate - had to be i&o at 0245 - 700cc. Hasn't urinated since then  Objective: Vital signs in last 24 hours: Temp:  [97.8 F (36.6 C)-98.4 F (36.9 C)] 98.1 F (36.7 C) (03/25 0531) Pulse Rate:  [70-100] 89 (03/25 0531) Resp:  [10-21] 16 (03/25 0531) BP: (112-162)/(68-83) 133/80 mmHg (03/25 0531) SpO2:  [95 %-100 %] 97 % (03/25 0531) Weight:  [276 lb (125.193 kg)] 276 lb (125.193 kg) (03/24 0849) Last BM Date: 11/01/13  Intake/Output from previous day: 03/24 0701 - 03/25 0700 In: 4141.7 [I.V.:4091.7; IV Piggyback:50] Out: 6948 [Urine:1220] Intake/Output this shift:    Alert, nad cta Reg Obese, soft, expected mild TTP, +BS, incisions c/d/i  Lab Results:   Recent Labs  11/02/13 1615 11/03/13 0425  WBC  --  13.0*  HGB 13.4 12.6  HCT 40.5 40.1  PLT  --  345   BMET  Recent Labs  11/03/13 0425  NA 139  K 4.3  CL 101  CO2 26  GLUCOSE 126*  BUN 6  CREATININE 0.77  CALCIUM 8.6   PT/INR No results found for this basename: LABPROT, INR,  in the last 72 hours ABG No results found for this basename: PHART, PCO2, PO2, HCO3,  in the last 72 hours  Studies/Results: No results found.  Anti-infectives: Anti-infectives   Start     Dose/Rate Route Frequency Ordered Stop   11/02/13 1400  cefOXitin (MEFOXIN) 2 g in dextrose 5 % 50 mL IVPB     2 g 100 mL/hr over 30 Minutes Intravenous  Once 11/02/13 1336 11/02/13 1415   11/02/13 0925  cefOXitin (MEFOXIN) 2 g in dextrose 5 % 50 mL IVPB     2 g 100 mL/hr over 30 Minutes Intravenous On call to O.R. 11/02/13 0925 11/02/13 1204      Assessment/Plan: s/p Procedure(s): LAPAROSCOPIC ROUX-EN-Y GASTRIC BYPASS WITH UPPER ENDOSCOPY (N/A) for UGI this am, if ok will start POD 1 diet; no tachy, no fever Cont VTE prophylaxis Will give until 0900 to urinate, if no spont void - replace foley DM - blood sugar ok. Cont SSI Ambulate,  IS  Leighton Ruff. Redmond Pulling, MD, FACS General, Bariatric, & Minimally Invasive Surgery [facility] Surgery, [place]   LOS: 1 day    Gayland Curry 11/03/2013

## 2013-11-03 NOTE — Progress Notes (Signed)
Patient alert and oriented, Post op day 1.  Provided support and encouragement.  Encouraged pulmonary toilet, ambulation and small sips of liquids.  All questions answered.  Will continue to monitor. 

## 2013-11-03 NOTE — Discharge Instructions (Signed)

## 2013-11-03 NOTE — Progress Notes (Signed)
Patient alert and oriented, pain is controlled. Patient is tolerating fluids, plan to advance to protein shake tomorrow. Reviewed Gastric Bypass discharge instructions with patient and patient is able to articulate understanding. Provided information on BELT program, Support Group and [facility] outpatient pharmacy. All questions answered, will continue to monitor.    

## 2013-11-03 NOTE — Progress Notes (Signed)
Nutrition Education Note   Body mass index is 41.98 kg/(m^2). Patient meets criteria for morbid obesity based on current BMI.   Consulted per DROP protocol. Educated pt on diet progression for the first 2 weeks post-op with emphasis on adequate hydration and intake of appropriate liquids - caffeine free, sugar free, and non-carbonated. Multivitamins and minerals also reviewed. Answered pt's questions about strained creamed soups and yogurt. Pt expressed understanding.   Diet: First 2 Weeks   You will see the nutritionist about two (2) weeks after your surgery. The nutritionist will increase the types of foods you can eat if you are handling liquids well:  If you have severe vomiting or nausea and cannot handle clear liquids lasting longer than 1 day, call your surgeon  Protein Shake  Drink at least 2 ounces of shake 5-6 times per day  Each serving of protein shakes (usually 8 - 12 ounces) should have a minimum of:  15 grams of protein  And no more than 5 grams of carbohydrate  Goal for protein each day:  Men = 80 grams per day  Women = 60 grams per day  Protein powder may be added to fluids such as non-fat milk or Lactaid milk or Soy milk (limit to 35 grams added protein powder per serving)   Hydration  Slowly increase the amount of water and other clear liquids as tolerated (See Acceptable Fluids)  Slowly increase the amount of protein shake as tolerated  Sip fluids slowly and throughout the day  May use sugar substitutes in small amounts (no more than 6 - 8 packets per day; i.e. Splenda)   Fluid Goal  The first goal is to drink at least 8 ounces of protein shake/drink per day (or as directed by the nutritionist); some examples of protein shakes are [employer], [employer], EAS Edge HP, and Unjury. See handout from pre-op Bariatric Education Class:  Slowly increase the amount of protein shake you drink as tolerated  You may find it easier to slowly sip shakes throughout the day   It is important to get your proteins in first  Your fluid goal is to drink 64 - 100 ounces of fluid daily  It may take a few weeks to build up to this  32 oz (or more) should be clear liquids  And  32 oz (or more) should be full liquids (see below for examples)  Liquids should not contain sugar, caffeine, or carbonation   Clear Liquids:  Water or Sugar-free flavored water (i.e. Fruit H2O, Propel)  Decaffeinated coffee or tea (sugar-free)  Crystal Lite, Wyler's Lite, Minute Maid Lite  Sugar-free Jell-O  Bouillon or broth  Sugar-free Popsicle: *Less than 20 calories each; Limit 1 per day   Full Liquids:  Protein Shakes/Drinks + 2 choices per day of other full liquids  Full liquids must be:  No More Than 12 grams of Carbs per serving  No More Than 3 grams of Fat per serving  Strained low-fat cream soup  Non-Fat milk  Fat-free Lactaid Milk  Sugar-free yogurt (Dannon Lite & Fit, Anon Raices yogurt)        [place] MS, [place], [place] Pager  [phone] After Hours Pager

## 2013-11-03 NOTE — Care Management Note (Signed)
    Page 1 of 1   11/03/2013     10:32:58 AM   CARE MANAGEMENT NOTE 11/03/2013  Patient:  [name], [name]   Account Number:  [id number]  Date Initiated:  11/03/2013  Documentation initiated by:  Sunday Spillers  Subjective/Objective Assessment:   43 yo female admitted s/p gastric bypass. PTA lived at home with family     Action/Plan:   Home when stable   Anticipated DC Date:  11/05/2013   Anticipated DC Plan:  [place]  CM consult      Choice offered to / List presented to:             Status of service:  Completed, signed off Medicare Important Message given?  NA - LOS <3 / Initial given by admissions (If response is "NO", the following Medicare IM given date fields will be blank) Date Medicare IM given:   Date Additional Medicare IM given:    Discharge Disposition:  HOME/SELF CARE  Per UR Regulation:  Reviewed for med. necessity/level of care/duration of stay  If discussed at [place] of Stay Meetings, dates discussed:    Comments:

## 2013-11-04 LAB — CBC WITH DIFFERENTIAL/PLATELET
BASOS ABS: 0 10*3/uL (ref 0.0–0.1)
Basophils Relative: 0 % (ref 0–1)
Eosinophils Absolute: 0.3 10*3/uL (ref 0.0–0.7)
Eosinophils Relative: 2 % (ref 0–5)
HEMATOCRIT: 38.2 % (ref 36.0–46.0)
Hemoglobin: 12 g/dL (ref 12.0–15.0)
LYMPHS PCT: 15 % (ref 12–46)
Lymphs Abs: 1.8 10*3/uL (ref 0.7–4.0)
MCH: 28.9 pg (ref 26.0–34.0)
MCHC: 31.4 g/dL (ref 30.0–36.0)
MCV: 92 fL (ref 78.0–100.0)
Monocytes Absolute: 1 10*3/uL (ref 0.1–1.0)
Monocytes Relative: 8 % (ref 3–12)
NEUTROS ABS: 9.4 10*3/uL — AB (ref 1.7–7.7)
Neutrophils Relative %: 75 % (ref 43–77)
PLATELETS: 343 10*3/uL (ref 150–400)
RBC: 4.15 MIL/uL (ref 3.87–5.11)
RDW: 13.6 % (ref 11.5–15.5)
WBC: 12.5 10*3/uL — AB (ref 4.0–10.5)

## 2013-11-04 LAB — GLUCOSE, CAPILLARY
Glucose-Capillary: 87 mg/dL (ref 70–99)
Glucose-Capillary: 100 mg/dL — ABNORMAL HIGH (ref 70–99)
Glucose-Capillary: 97 mg/dL (ref 70–99)

## 2013-11-04 MED ORDER — GI COCKTAIL ~~LOC~~
30.0000 mL | Freq: Three times a day (TID) | ORAL | Status: DC | PRN
  Administered 2013-11-04: 30 mL via ORAL
  Filled 2013-11-04: qty 30

## 2013-11-04 MED ORDER — PANTOPRAZOLE SODIUM 40 MG PO TBEC: 40.0000 mg | DELAYED_RELEASE_TABLET | ORAL | Status: DC

## 2013-11-04 NOTE — Progress Notes (Signed)
2 Days Post-Op  Subjective: Still with upper band of abd discomfort- no worse. Walking. Water went ok. Got a little nauseous after pill.   Objective: Vital signs in last 24 hours: Temp:  [97.9 F (36.6 C)-99 F (37.2 C)] 97.9 F (36.6 C) (03/26 0527) Pulse Rate:  [70-101] 93 (03/26 0527) Resp:  [18-22] 18 (03/26 0527) BP: (142-146)/(73-91) 146/83 mmHg (03/26 0527) SpO2:  [91 %-99 %] 95 % (03/26 0527) Last BM Date: 11/01/13  Intake/Output from previous day: 03/25 0701 - 03/26 0700 In: 3043.8 [I.V.:3043.8] Out: 3695 [Urine:3695] Intake/Output this shift:    Alert, nad, nontoxic cta b/l Reg Obese, soft, very very mild TTP. Incision c/d/i  Lab Results:   Recent Labs  11/03/13 0425 11/03/13 1609 11/04/13 0442  WBC 13.0*  --  12.5*  HGB 12.6 12.5 12.0  HCT 40.1 38.3 38.2  PLT 345  --  343   BMET  Recent Labs  11/03/13 0425  NA 139  K 4.3  CL 101  CO2 26  GLUCOSE 126*  BUN 6  CREATININE 0.77  CALCIUM 8.6   PT/INR No results found for this basename: LABPROT, INR,  in the last 72 hours ABG No results found for this basename: PHART, PCO2, PO2, HCO3,  in the last 72 hours  Studies/Results: Dg Ugi W/water Sol Cm  11/03/2013   ADDENDUM REPORT: 11/03/2013 10:31  ADDENDUM: These results were called by telephone at the time of interpretation on 11/03/2013 at 10:29 AM to [place], RN,  , who verbally acknowledged these results.   Electronically Signed   By: David  Martinique   On: 11/03/2013 10:31   11/03/2013   CLINICAL DATA:  Status post gastric bypass  EXAM: WATER SOLUBLE UPPER GI SERIES  TECHNIQUE: Single-column upper GI series was performed using water soluble contrast.  CONTRAST:  58mL OMNIPAQUE IOHEXOL 300 MG/ML  SOLN  COMPARISON:  None.  FLUOROSCOPY TIME:  0 min, 16 seconds.  FINDINGS: The scout film reveals moderate amount of small bowel gas in the left mid abdomen. The pattern does not appear obstructive. There is atelectasis at the left lung base.  Patient ingested  approximately 50 cc of Omnipaque orally. The distal esophagus demonstrated normal caliber. The site of the bypass is demonstrated. There is no evidence of a leak. The patient experienced no discomfort during the study.  IMPRESSION: There is no evidence of a leak status post gastric bypass.  Electronically Signed: By: David  Martinique On: 11/03/2013 10:10    Anti-infectives: Anti-infectives   Start     Dose/Rate Route Frequency Ordered Stop   11/02/13 1400  cefOXitin (MEFOXIN) 2 g in dextrose 5 % 50 mL IVPB     2 g 100 mL/hr over 30 Minutes Intravenous  Once 11/02/13 1336 11/02/13 1415   11/02/13 0925  cefOXitin (MEFOXIN) 2 g in dextrose 5 % 50 mL IVPB     2 g 100 mL/hr over 30 Minutes Intravenous On call to O.R. 11/02/13 0925 11/02/13 1204      Assessment/Plan: s/p Procedure(s): LAPAROSCOPIC ROUX-EN-Y GASTRIC BYPASS WITH UPPER ENDOSCOPY (N/A) No fever, tachy - increase diet to POD2 VTE prophylaxis - continue, hgb stable Urinary retention - discontinue foley Gi cocktail for heartburn  Will re-evaluate later today to see if able to go home Discussed d/c instructions  Leighton Ruff. Redmond Pulling, MD, FACS General, Bariatric, & Minimally Invasive Surgery [facility], [place]   LOS: 2 days    Gayland Curry 11/04/2013

## 2013-11-04 NOTE — Progress Notes (Signed)
Patient alert and oriented, Post op day 2.  Provided support and encouragement.  Encouraged pulmonary toilet, ambulation and small sips of liquids.  All questions answered.  Will continue to monitor. 

## 2013-11-04 NOTE — Discharge Summary (Signed)
Physician Discharge Summary  [name] NWG:[id number] DOB: [birth year] DOA: 11/02/2013  PCP: Lucretia Kern., DO  Admit date: 11/02/2013 Discharge date: 11/04/2013  Recommendations for Outpatient Follow-up:  1.   Follow-up Information   Follow up with Gayland Curry, MD On 11/11/2013. (at 10:15 AM)    Specialty:  General Surgery   Contact information:   [place] [place] [address] [phone]      Discharge Diagnoses:  Principal Problem:   Obesity, Class III, BMI 40-49.9 (morbid obesity) Active Problems:   Anxiety and depression, sees Dr. Toy Care whom prescripes her psych and sleep medications   Diabetes mellitus type 2, uncontrolled, without complications   Hyperlipidemia   S/P gastric bypass  Surgical Procedure: Laparoscopic Roux-en-Y gastric bypass, upper endoscopy  Discharge Condition: Good Disposition: Home  Diet recommendation: Postoperative gastric bypass diet  Filed Weights   11/02/13 0849 11/04/13 0700  Weight: 276 lb (125.193 kg) 276 lb (125.193 kg)     Hospital Course:  The patient was admitted for a planned laparoscopic Roux-en-Y gastric bypass. Please see operative note. Preoperatively the patient was given 5000 units of subcutaneous heparin for DVT prophylaxis. Postoperative prophylactic Lovenox dosing was started on the morning of postoperative day 1. The patient underwent an upper GI on postoperative day 1 which demonstrated no extravasation of contrast and emptying of the contrast into the Roux limb. The patient was started on ice chips and water which they tolerated. On postoperative day 2 The patient's diet was advanced to protein shakes which they also tolerated. The patient was ambulating without difficulty. Their vital signs are stable without fever or tachycardia. Their hemoglobin had remained stable.  The patient had received discharge instructions and counseling. They were deemed stable for discharge.   Discharge  Instructions  Discharge Orders   Future Appointments Provider Department Dept Phone   11/11/2013 10:15 AM Gayland Curry, MD [facility], [place] [phone]   11/16/2013 3:30 PM [facility] Post-Op Class [facility] [phone]   12/14/2013 10:15 AM Philemon Kingdom, MD [facility] [phone]   Future Orders Complete By Expires   Discharge instructions  As directed    Comments:     See bariatric discharge instructions   Increase activity slowly  As directed        Medication List    TAKE these medications       acetaminophen 500 MG tablet  Commonly known as:  TYLENOL  Take 1,000 mg by mouth every 6 (six) hours as needed for mild pain.     alprazolam 2 MG tablet  Commonly known as:  XANAX  Take 2 mg by mouth 2 (two) times daily as needed for anxiety.     DULoxetine 60 MG capsule  Commonly known as:  CYMBALTA  Take 120 mg by mouth every morning.     minoxidil 2 % external solution  Commonly known as:  ROGAINE  Apply 1 application topically daily.     oxyCODONE 5 MG/5ML solution  Commonly known as:  ROXICODONE  Take 5-10 mLs (5-10 mg total) by mouth every 4 (four) hours as needed for severe pain.     pantoprazole 40 MG tablet  Commonly known as:  PROTONIX  Take 1 tablet (40 mg total) by mouth daily.      ASK your doctor about these medications       zolpidem 10 MG tablet  Commonly known as:  AMBIEN  Take 10 mg by mouth  at bedtime.           Follow-up Information   Follow up with Gayland Curry, MD On 11/11/2013. (at 10:15 AM)    Specialty:  General Surgery   Contact information:   [address] [place] [address] [phone]        The results of significant diagnostics from this hospitalization (including imaging, microbiology, ancillary and laboratory) are listed below for reference.    Significant Diagnostic Studies: Dg Ugi W/water Sol Cm  11/03/2013   ADDENDUM REPORT:  11/03/2013 10:31  ADDENDUM: These results were called by telephone at the time of interpretation on 11/03/2013 at 10:29 AM to [place], RN,  , who verbally acknowledged these results.   Electronically Signed   By: David  Martinique   On: 11/03/2013 10:31   11/03/2013   CLINICAL DATA:  Status post gastric bypass  EXAM: WATER SOLUBLE UPPER GI SERIES  TECHNIQUE: Single-column upper GI series was performed using water soluble contrast.  CONTRAST:  83mL OMNIPAQUE IOHEXOL 300 MG/ML  SOLN  COMPARISON:  None.  FLUOROSCOPY TIME:  0 min, 16 seconds.  FINDINGS: The scout film reveals moderate amount of small bowel gas in the left mid abdomen. The pattern does not appear obstructive. There is atelectasis at the left lung base.  Patient ingested approximately 50 cc of Omnipaque orally. The distal esophagus demonstrated normal caliber. The site of the bypass is demonstrated. There is no evidence of a leak. The patient experienced no discomfort during the study.  IMPRESSION: There is no evidence of a leak status post gastric bypass.  Electronically Signed: By: David  Martinique On: 11/03/2013 10:10    Labs: Basic Metabolic Panel:  Recent Labs Lab 11/03/13 0425  NA 139  K 4.3  CL 101  CO2 26  GLUCOSE 126*  BUN 6  CREATININE 0.77  CALCIUM 8.6   Liver Function Tests:  Recent Labs Lab 11/03/13 0425  AST 48*  ALT 51*  ALKPHOS 74  BILITOT 0.4  PROT 6.6  ALBUMIN 3.4*    CBC:  Recent Labs Lab 11/02/13 1615 11/03/13 0425 11/03/13 1609 11/04/13 0442  WBC  --  13.0*  --  12.5*  NEUTROABS  --  9.8*  --  9.4*  HGB 13.4 12.6 12.5 12.0  HCT 40.5 40.1 38.3 38.2  MCV  --  92.4  --  92.0  PLT  --  345  --  343    CBG:  Recent Labs Lab 11/03/13 1725 11/03/13 1952 11/03/13 2354 11/04/13 0448 11/04/13 0722  GLUCAP 109* 87 95 87 100*    Active Problems:   S/P gastric bypass   Time coordinating discharge: 15 minutes  Signed:  Gayland Curry, MD [facility],  [place] [phone] 11/04/2013, 1:55 PM

## 2013-11-05 ENCOUNTER — Telehealth (HOSPITAL_COMMUNITY): Payer: Self-pay

## 2013-11-05 NOTE — Telephone Encounter (Signed)
Made discharge phone call to patient per DROP protocol. Asking the following questions.    1. Do you have someone to care for you now that you are home?  y 2. Are you having pain now that is not relieved by your pain medication? y   3. Are you able to drink the recommended daily amount of fluids (48 ounces minimum/day) and protein (60-80 grams/day) as prescribed by the dietitian or nutritional counselor?  Not yet, working on it 4. Are you taking the vitamins and minerals as prescribed?  n 5. Do you have the "on call" number to contact your surgeon if you have a problem or question?  y 13. Are your incisions free of redness, swelling or drainage? (If steri strips, address that these can fall off, shower as tolerated) y 7. Have your bowels moved since your surgery? No  If not, are you passing gas?  yes 8. Are you up and walking 3-4 times per day?  yes 9. Do you have an appointment to see a dietitian or nutritional counselor in the next month?  yes  The following questions can be added at the center's discharge phone call script at the center's discretion.  The questions are also captured within D[employer]P. project custom fields. 1. Do you have an appointment made to see your surgeon in the next month?  yes 2. Were you provided your discharge medications before your surgery or before you were discharged from the hospital and are you taking them without problem?  yes 3. Were you provided phone numbers to the clinic/surgeon's office?  yes 4. Did you watch the patient education video module in the (clinic, surgeon's office, etc.) before your surgery? yes 5. Do you have a discharge checklist that was provided to you in the hospital to reference with instructions on how to take care of yourself after surgery?  yes 6. Did you see a dietitian or nutritional counselor while you were in the hospital?   yes

## 2013-11-11 ENCOUNTER — Ambulatory Visit (INDEPENDENT_AMBULATORY_CARE_PROVIDER_SITE_OTHER): Payer: BC Managed Care – PPO | Admitting: General Surgery

## 2013-11-11 ENCOUNTER — Encounter (INDEPENDENT_AMBULATORY_CARE_PROVIDER_SITE_OTHER): Payer: Self-pay | Admitting: General Surgery

## 2013-11-11 VITALS — BP 122/80 | HR 76 | Temp 97.5°F | Ht 68.0 in | Wt 271.0 lb

## 2013-11-11 DIAGNOSIS — Z9884 Bariatric surgery status: Secondary | ICD-10-CM

## 2013-11-11 NOTE — Patient Instructions (Signed)
Try room temperature liquids instead of cold liquids Work on increasing daily fluid intake - goal 64 oz Continue to walk daily Look at bariatric advantage or bariatric fusion for Calcium chews For spasms at night - try Xanax Follow up with nutritionist next week

## 2013-11-11 NOTE — Progress Notes (Signed)
Subjective:     Patient ID: [name], female   DOB: Dec 29, 1970, 43 y.o.   MRN: [id number]  HPI 43 year old Caucasian female status post laparoscopic Roux-en-Y gastric bypass comes in for one-week followup. She was discharged from hospital on March 26. She denies any fevers or chills. She has had some nausea and abdominal wall spasms at night. She is no longer taking pain medication. She has noticed that drinking cold liquids tends to cause her to have some abdominal discomfort. She is having daily bowel movements however they are loose. She is continuing to increase her daily fluid intake but is not at 64 ounces. She also complains of the taste of the calcium supplements. She states her main complaint is lack of energy  Review of Systems     Objective:   Physical Exam BP 122/80  Pulse 76  Temp(Src) 97.5 F (36.4 C) (Oral)  Ht 5\' 8"  (1.727 m)  Wt 271 lb (122.925 kg)  BMI 41.22 kg/m2  Gen: alert, NAD, non-toxic appearing Pupils: equal, no scleral icterus Pulm: Lungs clear to auscultation, symmetric chest rise CV: regular rate and rhythm Abd: soft, nontender, nondistended. Well-healed trocar sites. No cellulitis. No incisional hernia Ext: no edema, no calf tenderness Skin: no rash, no jaundice     Assessment:     Status post laparoscopic Roux-en-Y gastric bypass Diabetes mellitus type 2 Hyperlipidemia Anxiety and depression     Plan:     Overall I think she is doing well. Her complaints are typical. We discussed drinking room temperature liquids as opposed to cold liquids. I advised her that the abdominal wall spasms are not uncommon. We discussed taking a low dose Xanax at night to help with the spasms. She was encouraged to walk daily. We discussed bariatric advantage or bariatric fusion calcium chews. We discussed that taste sensations after surgery can be altered and this is not uncommon. Overall I think she is on target. Currently there appeared to be no complications from  surgery. Total weight loss has been 20 pounds so far. She is to see the nutritionist next week. She is to stay on full diet until then. Follow with me in 3 week  Leighton Ruff. Redmond Pulling, MD, FACS General, Bariatric, & Minimally Invasive Surgery [facility], [place]

## 2013-11-16 ENCOUNTER — Encounter: Payer: BC Managed Care – PPO | Attending: General Surgery

## 2013-11-16 VITALS — Ht 68.0 in | Wt 265.5 lb

## 2013-11-16 DIAGNOSIS — Z713 Dietary counseling and surveillance: Secondary | ICD-10-CM | POA: Insufficient documentation

## 2013-11-16 NOTE — Patient Instructions (Signed)
Patient to follow Phase 3A-Soft, High Protein Diet and follow-up at [facility] in 6 weeks for 2 months post-op nutrition visit for diet advancement. 

## 2013-11-16 NOTE — Progress Notes (Signed)
  Bariatric Class:  Appt start time: 1530 end time:  1630.  2 Week Post-Operative Nutrition Class  Patient was seen on 11/16/2013 for Post-Operative [facility]   Surgery date: 11/02/2013 Surgery type: RYGB Start weight at [facility]: 291 on 05/24/13 Weight today: 265.5 lbs  Weight change: 13.5 lbs Total weight loss: 25.5 lbs  TANITA  BODY COMP RESULTS  10/28/2013 11/16/13   BMI (kg/m^2) 42.4 40.4   Fat Mass (lbs) 151 140.5   Fat Free Mass (lbs) 128 125.0   Total Body Water (lbs) 93.5 91.5   The following the learning objectives were met by the patient during this course:  Identifies Phase 3A (Soft, High Proteins) Dietary Goals and will begin from 2 weeks post-operatively to 2 months post-operatively  Identifies appropriate sources of fluids and proteins   States protein recommendations and appropriate sources post-operatively  Identifies the need for appropriate texture modifications, mastication, and bite sizes when consuming solids  Identifies appropriate multivitamin and calcium sources post-operatively  Describes the need for physical activity post-operatively and will follow MD recommendations  States when to call healthcare provider regarding medication questions or post-operative complications  Handouts given during class include:  Phase 3A: Soft, High Protein Diet Handout  Follow-Up Plan: Patient will follow-up at [facility] in 6 weeks for 8 week post-op nutrition visit for diet advancement per MD.

## 2013-11-25 ENCOUNTER — Encounter (INDEPENDENT_AMBULATORY_CARE_PROVIDER_SITE_OTHER): Payer: Self-pay | Admitting: General Surgery

## 2013-11-26 ENCOUNTER — Telehealth (INDEPENDENT_AMBULATORY_CARE_PROVIDER_SITE_OTHER): Payer: Self-pay | Admitting: General Surgery

## 2013-11-26 MED ORDER — ONDANSETRON HCL 4 MG PO TABS: 4.0000 mg | ORAL_TABLET | Freq: Three times a day (TID) | ORAL | Status: DC | PRN

## 2013-11-26 MED ORDER — PANTOPRAZOLE SODIUM 40 MG PO TBEC: 40.0000 mg | DELAYED_RELEASE_TABLET | ORAL | Status: DC

## 2013-11-26 NOTE — Telephone Encounter (Signed)
I called patient after getting her e-mail. She denies any fevers or chills. She reports some fairly steady mid abdominal pain that she reports as a burning sensation. Eating and drinking does not worsen it. She is tolerating liquids and eating well. She reports normal bowel movements. She denies any inability to eat. She reports normal urination. Advised her to contact the office if her symptoms worsen or if she gets the point where she has difficulty staying hydrated and eating. Advised her to avoid bulky foods. Prescribed Zofran and Protonix through epic. Also advised patient to get over-the-counter simethicone. She was instructed to call the office if her symptoms worsen or if they do not improve. She voiced understanding

## 2013-12-01 ENCOUNTER — Encounter (INDEPENDENT_AMBULATORY_CARE_PROVIDER_SITE_OTHER): Payer: Self-pay | Admitting: General Surgery

## 2013-12-01 ENCOUNTER — Ambulatory Visit (INDEPENDENT_AMBULATORY_CARE_PROVIDER_SITE_OTHER): Payer: BC Managed Care – PPO | Admitting: General Surgery

## 2013-12-01 VITALS — BP 122/80 | HR 74 | Temp 97.6°F | Resp 14 | Ht 68.0 in | Wt 225.8 lb

## 2013-12-01 DIAGNOSIS — Z9884 Bariatric surgery status: Secondary | ICD-10-CM

## 2013-12-01 NOTE — Progress Notes (Signed)
Subjective:     Patient ID: [name], female   DOB: December 06, 1970, 43 y.o.   MRN: [id number]  HPI 43 year old female comes in for followup after undergoing laparoscopic Roux-en-Y gastric bypass on March 24. I last saw her about 3 weeks ago on April 2. Her weight at that time was 271 pounds. Her preoperative weight was 291 pounds. She denies any fevers or chills. She only has some nausea and regurgitation if she eats solids and meats like chicken or pork. She has no nausea regurgitation with liquids or fish or shrimp. She does still have some mild burning sensation in the midportion of her abdomen. She denies any diarrhea or constipation. She is taking her supplements as directed. She is walking several times a day. She reports good energy level. She denies any paresthesias.  She is no longer taking her oral diabetes medication Review of Systems     Objective:   Physical Exam BP 122/80  Pulse 74  Temp(Src) 97.6 F (36.4 C) (Temporal)  Resp 14  Ht 5\' 8"  (1.727 m)  Wt 225 lb 12.8 oz (102.422 kg)  BMI 34.34 kg/m2  Gen: alert, NAD, non-toxic appearing Pupils: equal, no scleral icterus Pulm: Lungs clear to auscultation, symmetric chest rise CV: regular rate and rhythm Abd: soft, nontender, nondistended. Well-healed trocar sites. No cellulitis. No incisional hernia Ext: no edema,  Skin: no rash, no jaundice     Assessment:     Status post laparoscopic Roux-en-Y gastric bypass Diabetes mellitus type 2-improved Hyperlipidemia Anxiety-depression     Plan:     Overall I think she is doing well. I congratulated her on her weight loss. We discussed the importance of ongoing regular routine daily activity for long-term weight loss success in weight gain prevention. She was instructed to continue to take her supplements as directed. I believe the intolerance that she reports with chicken and beef his typical said she has no problems with liquids and softer proteins whitefish. I encouraged  her to attend a support group to discuss different cooking techniques. We discussed proper eating techniques and behaviors. She was given information for the BELT program. She was given a note to return to work. We will check a CMET on her. Followup 3 months  Leighton Ruff. Redmond Pulling, MD, FACS General, Bariatric, & Minimally Invasive Surgery [facility], [place]

## 2013-12-01 NOTE — Patient Instructions (Addendum)
Contact the BELT program for additional information Walk or exercise daily Get your labs drawn  Attend the Support Group at [facility] - can be helpful with cooking techniques for meats Stick more to fish for time being  Eating techniques 20-20-20 (30-30-30) 20 chews, 20 seconds between bites of food, 20 minutes to eat; sometimes you may need 30 chews, 30 seconds etc Use your nondominant hand to eat with Put fork down between bites of food Use a timer after swallowing to reinforce waiting 20-30 sec between bites of food Use a child/infant size utensil Try not to eat while watching TV

## 2013-12-08 LAB — COMPREHENSIVE METABOLIC PANEL
ALK PHOS: 94 U/L (ref 39–117)
ALT: 49 U/L — ABNORMAL HIGH (ref 0–35)
AST: 40 U/L — ABNORMAL HIGH (ref 0–37)
Albumin: 4.3 g/dL (ref 3.5–5.2)
BUN: 13 mg/dL (ref 6–23)
CO2: 27 meq/L (ref 19–32)
CREATININE: 0.65 mg/dL (ref 0.50–1.10)
Calcium: 9.5 mg/dL (ref 8.4–10.5)
Chloride: 100 mEq/L (ref 96–112)
GLUCOSE: 85 mg/dL (ref 70–99)
Potassium: 3.7 mEq/L (ref 3.5–5.3)
Sodium: 136 mEq/L (ref 135–145)
TOTAL PROTEIN: 6.8 g/dL (ref 6.0–8.3)
Total Bilirubin: 0.5 mg/dL (ref 0.2–1.2)

## 2013-12-14 ENCOUNTER — Ambulatory Visit: Payer: BC Managed Care – PPO | Admitting: Internal Medicine

## 2013-12-15 ENCOUNTER — Telehealth: Payer: Self-pay | Admitting: Dietician

## 2013-12-15 NOTE — Telephone Encounter (Signed)
Emailed [name] the Phase 3B High Protein + Non Starchy Vegetable phase with instructions to start follow it at 8 weeks post op (01/02/2014). She will follow up with me for her next appointment in June.

## 2013-12-17 ENCOUNTER — Encounter: Payer: Self-pay | Admitting: Family Medicine

## 2013-12-17 ENCOUNTER — Ambulatory Visit (INDEPENDENT_AMBULATORY_CARE_PROVIDER_SITE_OTHER): Payer: BC Managed Care – PPO | Admitting: Family Medicine

## 2013-12-17 VITALS — BP 140/82 | HR 78 | Wt 221.0 lb

## 2013-12-17 DIAGNOSIS — M1710 Unilateral primary osteoarthritis, unspecified knee: Secondary | ICD-10-CM

## 2013-12-17 MED ORDER — DICLOFENAC SODIUM 2 % TD SOLN: 2.0000 "application " | Freq: Two times a day (BID) | TRANSDERMAL | Status: DC

## 2013-12-17 NOTE — Assessment & Plan Note (Signed)
Patient did have a injection today. Patient given home exercise program as well as a brace today. Patient will try these interventions and come back again in 3 weeks. Patient has started on formal physical therapy and if she continues to have pain she did have failed all conservative therapy and would be a candidate for viscous supplementation  Spent greater than 25 minutes with patient face-to-face and had greater than 50% of counseling including as described above in assessment and plan.

## 2013-12-17 NOTE — Patient Instructions (Signed)
Good to see you and you look great! Ice 20 minutes 2 times a day Exercises 3 times a week and swim or bike would be great.  Pennsaid topically 2 times a day Come back in 3 weeks to see how you are doing.

## 2013-12-17 NOTE — Progress Notes (Signed)
CC: right knee pain follow up  HPI: Patient is a very pleasant 43 year old female returning for continued right knee pain. Patient does have severe chondromalacia with tricompartment degenerative changes of the right knee seen on previous surgery.  Patient also 5 months ago did finish the series of Synvisc injections. Patient states that her right knee is feeling wonderful. Patient recently did have a gastric bypass surgery and wants to be more active but secondary to the amount of pain in her left knee she is having difficulty. Patient states she has been working with a Clinical research associate as well as a physical therapist to try to help but does not have any significant improvement. Patient had such good results with Synvisc injections on her other knee but she would like to do it again. Patient states that the knee does have a feeling of instability. Denies giving out on her. Denies any radiation or numbness. If the pain is 8/10 in severity.   Past medical history, Surgical history, Family history not pertinant except as noted below, Social history, Allergies, and medications have been entered into the medical record, reviewed, and no changes needed.   Review of Systems: No fevers, chills, night sweats, weight loss, chest pain, or shortness of breath.   Objective:   Blood pressure 140/82, pulse 78, weight 221 lb (100.245 kg), SpO2 98.00%.  General: Well Developed, well nourished, and in no acute distress. Obese Neuro: Alert and oriented x3, extra-ocular muscles intact, sensation grossly intact.  HEENT: Normocephalic, atraumatic, pupils equal round reactive to light, neck supple, no masses, no lymphadenopathy, thyroid nonpalpable.  Skin: Warm and dry, no rashes. Cardiac:  no lower extremity edema. Respiratory: Not using accessory muscles, speaking in full sentences. Abdominal: NT, soft Gait: Nonantlagic, good balance and coordination Lymphatic: no lymphadenopathy in neck or axillae on palpation, non tender.   Musculoskeletal: Inspection and palpation of the right and left upper extremities including the shoulders elbows and wrist are unremarkable with full range of motion and good muscle strength and tone. Inspection and palpation of the right and left lower extremities including the hips  and ankles are unremarkable and nontender with full range of motion and good muscle strength and tone and are symmetric. Knee: Left Patient does have trace effusion of the knee compared to the contralateral side. Positive medial joint line tenderness, and patellar tenderness, but no condyle tenderness. Patient contralateral knee is unremarkable for pain. ROM full in flexion and extension and lower leg rotation. Ligaments with solid consistent endpoints including ACL, PCL, LCL, MCL. Positive Mcmurray's, Apley's, and Thessalonian tests. Painful patellar compression. Patellar glide with significant crepitus. Patellar and quadriceps tendons unremarkable but does have some weakness of the quadriceps on the right side compared to the contralateral side Hamstring strength is normal .   After informed written and verbal consent, patient was seated on exam table. Left knee was prepped with alcohol swab and utilizing anterolateral approach, patient's left knee space was injected with 4:1  marcaine 0.5%: Kenalog 40mg /dL. Patient tolerated the procedure well without immediate complications.

## 2013-12-28 ENCOUNTER — Ambulatory Visit: Payer: BC Managed Care – PPO | Admitting: Dietician

## 2014-01-17 ENCOUNTER — Encounter: Payer: BC Managed Care – PPO | Attending: General Surgery | Admitting: Dietician

## 2014-01-17 DIAGNOSIS — Z713 Dietary counseling and surveillance: Secondary | ICD-10-CM | POA: Insufficient documentation

## 2014-01-17 NOTE — Progress Notes (Signed)
  Follow-up visit:  10 Weeks Post-Operative RYGB Surgery  Medical Nutrition Therapy:  Appt start time: 1400 end time:  1430.  Primary concerns today: Post-operative Bariatric Surgery Nutrition Management. Returns with a 31 lbs weight loss. Not able to tolerate chicken or pork. Able to eat some red meat and seafood. Was having a lot of issues with nausea and vomiting and not sure why. Still taking zofran as needed. Drinking protein shakes for breakfast and lunch.    Starting to feel a little hungry in the last week.   Surgery date: 11/02/2013  Surgery type: RYGB  Start weight at [facility]: 291 on 05/24/13  Weight today: 234.5  lbs   Weight change: 31 lbs, 34 lbs fat loss  Total weight loss: 56.5 lbs   TANITA BODY COMP RESULTS   10/28/2013  11/16/13  01/17/14  BMI (kg/m^2)  42.4  40.4  35.7  Fat Mass (lbs)  151  140.5  106.5  Fat Free Mass (lbs)  128  125.0  128.0  Total Body Water (lbs)  93.5  91.5  93.5    Preferred Learning Style:   No preference indicated   Learning Readiness:   Ready  24-hr recall: B (AM): Premier (30 g) Snk ( AM): none  L (PM): Premier (30 g) Snk ( PM): not usually, or a piece of cheese  D (PM): cooked vegetables and 2 oz protein (14 g)  Snk (PM): sugar free popsicle or fudgsicle   Fluid intake: 32-40 oz decaf unsweet iced tea, 8 oz water, 22 oz protein shake 62-70 oz fluid Estimated total protein intake: 74 g  Medications: see list Supplementation: taking, better taking them during the week than weekend  Using straws: Sometimes (has sensitive teeth) Drinking while eating: No Hair loss: Yes Carbonated beverages: No N/V/D/C: having quite a bit of nausea and vomiting, through has been improving, has had some constipation will take dulcalax as needed (3 times) Dumping syndrome: No  Recent physical activity:  Walking everyday for 20-30 minutes  Progress Towards Goal(s):    Handouts given during visit include:  Phase 3A High Protein  Phase 3B High  Protein + Non Starchy Vegetables   Nutritional Diagnosis:  [place]-3.3 Overweight/obesity related to past poor dietary habits and physical inactivity as evidenced by patient w/ recent RYGB surgery following dietary guidelines for continued weight loss.    Intervention:  Nutrition education/diet advancement.  Teaching Method Utilized:  Visual Auditory Hands on  Barriers to learning/adherence to lifestyle change: hx of nausea and vomiting, difficulty tolerating protein foods  Demonstrated degree of understanding via:  Teach Back   Monitoring/Evaluation:  Dietary intake, exercise, lap band fills, and body weight. Follow up in  months for 3 month post-op visit.

## 2014-01-17 NOTE — Patient Instructions (Signed)
Goals:  Follow Phase 3B: High Protein + Non-Starchy Vegetables  Eat 3-6 small meals/snacks, every 3-5 hrs  Increase lean protein foods to meet 60g goal  Increase fluid intake to 64oz +  Avoid drinking 15 minutes before, during and 30 minutes after eating  Aim for >30 min of physical activity daily  Try to have some protein foods at lunch or at snacks if tolerated

## 2014-01-24 ENCOUNTER — Ambulatory Visit (INDEPENDENT_AMBULATORY_CARE_PROVIDER_SITE_OTHER)
Admission: RE | Admit: 2014-01-24 | Discharge: 2014-01-24 | Disposition: A | Discharge status: Home / Self Care | Payer: BC Managed Care – PPO | Source: Ambulatory Visit | Attending: Family Medicine | Admitting: Family Medicine

## 2014-01-24 ENCOUNTER — Other Ambulatory Visit (INDEPENDENT_AMBULATORY_CARE_PROVIDER_SITE_OTHER): Payer: BC Managed Care – PPO

## 2014-01-24 ENCOUNTER — Encounter: Payer: Self-pay | Admitting: Family Medicine

## 2014-01-24 ENCOUNTER — Ambulatory Visit (INDEPENDENT_AMBULATORY_CARE_PROVIDER_SITE_OTHER): Payer: BC Managed Care – PPO | Admitting: Family Medicine

## 2014-01-24 VITALS — BP 142/82 | HR 71 | Ht 68.0 in | Wt 233.0 lb

## 2014-01-24 DIAGNOSIS — M25562 Pain in left knee: Secondary | ICD-10-CM

## 2014-01-24 DIAGNOSIS — M1710 Unilateral primary osteoarthritis, unspecified knee: Secondary | ICD-10-CM

## 2014-01-24 DIAGNOSIS — M25569 Pain in unspecified knee: Secondary | ICD-10-CM

## 2014-01-24 NOTE — Progress Notes (Signed)
CC: Left knee pain followup.  HPI: Patient is here for followup of her left knee pain. Patient was seen previously and was given a steroid injection as well as a brace, home exercises and icing protocol for her left knee pain. Patient has done formal physical therapy and failed all other conservative measures. Patient states overall that she was doing significantly better and then over the course last 2 weeks the pain started to return. Patient states that she is feeling more of a fullness in his knee as well. Patient would like to remain active but is having difficulty secondary to pain. Patient has been wearing the brace as well as doing the topical anti-inflammatories with minimal improvement. Patient states that this affects her daily activities. Patient has failed all other conservative therapy at this time.   Past medical history, Surgical history, Family history not pertinant except as noted below, Social history, Allergies, and medications have been entered into the medical record, reviewed, and no changes needed.   Review of Systems: No fevers, chills, night sweats, weight loss, chest pain, or shortness of breath.   Objective:   Blood pressure 142/82, pulse 71, height 5\' 8"  (1.727 m), weight 233 lb (105.688 kg), last menstrual period 01/17/2014, SpO2 99.00%.  General: Well Developed, well nourished, and in no acute distress. Obese Neuro: Alert and oriented x3, extra-ocular muscles intact, sensation grossly intact.  HEENT: Normocephalic, atraumatic, pupils equal round reactive to light, neck supple, no masses, no lymphadenopathy, thyroid nonpalpable.  Skin: Warm and dry, no rashes. Cardiac:  no lower extremity edema. Respiratory: Not using accessory muscles, speaking in full sentences. Abdominal: NT, soft Gait: Nonantlagic, good balance and coordination Lymphatic: no lymphadenopathy in neck or axillae on palpation, non tender.  Musculoskeletal: Inspection and palpation of the right and  left upper extremities including the shoulders elbows and wrist are unremarkable with full range of motion and good muscle strength and tone. Inspection and palpation of the right and left lower extremities including the hips  and ankles are unremarkable and nontender with full range of motion and good muscle strength and tone and are symmetric. Knee: Left Patient does have trace effusion of the knee compared to the contralateral side. Positive medial joint line tenderness, and patellar tenderness, but no condyle tenderness. Patient contralateral knee is unremarkable for pain. ROM full in flexion and extension and lower leg rotation. Ligaments with solid consistent endpoints including ACL, PCL, LCL, MCL. Positive Mcmurray's, Apley's, and Thessalonian tests. Painful patellar compression. Patellar glide with significant crepitus. Patellar and quadriceps tendons unremarkable but does have some weakness of the quadriceps on the right side compared to the contralateral side Hamstring strength is normal .  After verbal consent patient was prepped with alcohol swabs and with a 21-gauge 2 inch needle was injected with 3 cc of 0.5% Marcaine. Patient then had 10 cc of strawlike colored fluid removed. Patient had 16 mg/2.5 mL of Synvisc (sodium hyaluronate) in a prefilled syringe was injected easily into the left knee. Patient tolerated the procedure well. This was done under ultrasound guidance

## 2014-01-24 NOTE — Assessment & Plan Note (Signed)
Patient had injection #1 of 3 of the Synvisc injections today. Patient did have some fluid removed from the knee as well as I think will be beneficial. We discussed continued bracing and icing that we'll be helpful. Patient will come back again in one week for the second in the series of injections.

## 2014-01-24 NOTE — Patient Instructions (Addendum)
Always good to see you Continue the ice and brace and exercises.  See you in 1 week.

## 2014-01-31 ENCOUNTER — Ambulatory Visit (INDEPENDENT_AMBULATORY_CARE_PROVIDER_SITE_OTHER): Payer: BC Managed Care – PPO | Admitting: Family Medicine

## 2014-01-31 ENCOUNTER — Encounter: Payer: Self-pay | Admitting: Family Medicine

## 2014-01-31 VITALS — BP 132/82 | HR 59 | Ht 68.0 in | Wt 231.0 lb

## 2014-01-31 DIAGNOSIS — M1710 Unilateral primary osteoarthritis, unspecified knee: Secondary | ICD-10-CM

## 2014-01-31 DIAGNOSIS — M1711 Unilateral primary osteoarthritis, right knee: Secondary | ICD-10-CM

## 2014-01-31 NOTE — Patient Instructions (Signed)
You are doing well We drained a lot today and did #2 See you in a week for #3

## 2014-01-31 NOTE — Assessment & Plan Note (Signed)
Patient think is responding to the Synvisc supplementation. Patient did have 30 cc of strawlike colored fluid removed from the knee. I think this will also help with her range of motion and activity. Patient will come back to in one week. As where she continues to do well we will do the third and final Synvisc injection.

## 2014-01-31 NOTE — Progress Notes (Signed)
CC: Left knee pain followup.  HPI: Patient is here for followup of her left knee pain. Patient was seen previously and was given a steroid injection as well as a brace, home exercises and icing protocol for her left knee pain. Patient has done formal physical therapy and failed all other conservative measures.  Patient has been wearing the brace as well as doing the topical anti-inflammatories with minimal improvement. Patient states that this affects her daily activities. Patient has failed all other conservative therapy at this time. Patient did have the first injection of Synvisc previously. Patient states that the pain did not improve but she was able to swim as well as bike returns last week which she has not been able to do. In that case patient think she may have made some improvement. Denies any new symptoms.   Past medical history, Surgical history, Family history not pertinant except as noted below, Social history, Allergies, and medications have been entered into the medical record, reviewed, and no changes needed.   Review of Systems: No fevers, chills, night sweats, weight loss, chest pain, or shortness of breath.   Objective:   Blood pressure 132/82, pulse 59, height 5\' 8"  (1.727 m), weight 231 lb (104.781 kg), last menstrual period 01/17/2014, SpO2 99.00%.  General: Well Developed, well nourished, and in no acute distress. Obese Neuro: Alert and oriented x3, extra-ocular muscles intact, sensation grossly intact.  HEENT: Normocephalic, atraumatic, pupils equal round reactive to light, neck supple, no masses, no lymphadenopathy, thyroid nonpalpable.  Skin: Warm and dry, no rashes. Cardiac:  no lower extremity edema. Respiratory: Not using accessory muscles, speaking in full sentences. Abdominal: NT, soft Gait: Nonantlagic, good balance and coordination Lymphatic: no lymphadenopathy in neck or axillae on palpation, non tender.  Musculoskeletal: Inspection and palpation of the right  and left upper extremities including the shoulders elbows and wrist are unremarkable with full range of motion and good muscle strength and tone. Inspection and palpation of the right and left lower extremities including the hips  and ankles are unremarkable and nontender with full range of motion and good muscle strength and tone and are symmetric. Knee: Left Patient does have trace effusion of the knee compared to the contralateral side. Positive medial joint line tenderness, and patellar tenderness, but no condyle tenderness. Patient contralateral knee is unremarkable for pain. ROM full in flexion and extension and lower leg rotation. Ligaments with solid consistent endpoints including ACL, PCL, LCL, MCL. Positive Mcmurray's, Apley's, and Thessalonian tests. Painful patellar compression. Patellar glide with significant crepitus. Patellar and quadriceps tendons unremarkable but does have some weakness of the quadriceps on the right side compared to the contralateral side Hamstring strength is normal .  After verbal consent patient was prepped with alcohol swabs and with a 21-gauge 2 inch needle was injected with 3 cc of 0.5% Marcaine. Patient then had 30 cc of strawlike colored fluid removed. Patient had 16 mg/2.5 mL of Synvisc (sodium hyaluronate) in a prefilled syringe was injected easily into the left knee. Patient tolerated the procedure well. This was done under ultrasound guidance

## 2014-02-07 ENCOUNTER — Encounter: Payer: Self-pay | Admitting: Family Medicine

## 2014-02-07 ENCOUNTER — Ambulatory Visit (INDEPENDENT_AMBULATORY_CARE_PROVIDER_SITE_OTHER): Payer: BC Managed Care – PPO | Admitting: Family Medicine

## 2014-02-07 VITALS — BP 128/78 | HR 72 | Ht 68.0 in | Wt 227.0 lb

## 2014-02-07 DIAGNOSIS — M1712 Unilateral primary osteoarthritis, left knee: Secondary | ICD-10-CM

## 2014-02-07 DIAGNOSIS — M1710 Unilateral primary osteoarthritis, unspecified knee: Secondary | ICD-10-CM

## 2014-02-07 NOTE — Progress Notes (Signed)
CC: Left knee pain followup.  HPI: Patient is here for followup of her left knee pain. Patient was seen previously and was given a steroid injection as well as a brace, home exercises and icing protocol for her left knee pain. Patient has done formal physical therapy and failed all other conservative measures.  Patient has been wearing the brace as well as doing the topical anti-inflammatories with minimal improvement. Patient has had 2 injections of Synvisc and is here for her third Synvisc injection. Patient states he has not had any improvement with 2 injections of her.  Past medical history, Surgical history, Family history not pertinant except as noted below, Social history, Allergies, and medications have been entered into the medical record, reviewed, and no changes needed.   Review of Systems: No fevers, chills, night sweats, weight loss, chest pain, or shortness of breath.   Objective:   Blood pressure 128/78, pulse 72, height 5\' 8"  (4.734 m), weight 227 lb (102.967 kg), last menstrual period 01/17/2014, SpO2 98.00%.  General: Well Developed, well nourished, and in no acute distress. Obese Neuro: Alert and oriented x3, extra-ocular muscles intact, sensation grossly intact.  HEENT: Normocephalic, atraumatic, pupils equal round reactive to light, neck supple, no masses, no lymphadenopathy, thyroid nonpalpable.  Skin: Warm and dry, no rashes. Cardiac:  no lower extremity edema. Respiratory: Not using accessory muscles, speaking in full sentences. Abdominal: NT, soft Gait: Nonantlagic, good balance and coordination Lymphatic: no lymphadenopathy in neck or axillae on palpation, non tender.  Musculoskeletal: Inspection and palpation of the right and left upper extremities including the shoulders elbows and wrist are unremarkable with full range of motion and good muscle strength and tone. Inspection and palpation of the right and left lower extremities including the hips  and ankles are  unremarkable and nontender with full range of motion and good muscle strength and tone and are symmetric. Knee: Left No swelling noted today.Marland Kitchen Positive medial joint line tenderness, and patellar tenderness, but no condyle tenderness. Patient contralateral knee is unremarkable for pain. ROM full in flexion and extension and lower leg rotation. Ligaments with solid consistent endpoints including ACL, PCL, LCL, MCL. Positive Mcmurray's, Apley's, and Thessalonian tests. Painful patellar compression. Patellar glide with significant crepitus. Patellar and quadriceps tendons unremarkable but does have some weakness of the quadriceps on the right side compared to the contralateral side Hamstring strength is normal .  After verbal consent patient was prepped with alcohol swabs and with a 21-gauge 2 inch needle was injected with 3 cc of 0.5% Marcaine. Patient then had 0 cc of strawlike colored fluid removed. Patient had 16 mg/2.5 mL of Synvisc (sodium hyaluronate) in a prefilled syringe was injected easily into the left knee. Patient tolerated the procedure well. This was done under ultrasound guidance

## 2014-02-07 NOTE — Assessment & Plan Note (Signed)
Patient was given third and final Synvisc injection today. We discussed continuing icing, bracing, and home exercises. Patient will follow up in one month for further evaluation and treatment. Patient has any pain in the interim we may need to consider further imaging such as an MRI.

## 2014-02-13 ENCOUNTER — Other Ambulatory Visit (INDEPENDENT_AMBULATORY_CARE_PROVIDER_SITE_OTHER): Payer: Self-pay | Admitting: General Surgery

## 2014-02-17 ENCOUNTER — Encounter (INDEPENDENT_AMBULATORY_CARE_PROVIDER_SITE_OTHER): Payer: Self-pay | Admitting: General Surgery

## 2014-02-21 ENCOUNTER — Telehealth (INDEPENDENT_AMBULATORY_CARE_PROVIDER_SITE_OTHER): Payer: Self-pay | Admitting: General Surgery

## 2014-02-21 NOTE — Telephone Encounter (Signed)
Message copied by Maryclare Bean on Mon Feb 21, 2014  3:38 PM ------      Message from: Redmond Pulling, ERIC M      Created: Mon Feb 21, 2014  2:53 PM       This pt emailed me saying she was having trouble getting her 3 month postbypass appt to see me this month. The date she was offered for this month - she is out of town. pls call her and get in her in. I can see her this wed at 12:15 or this Thursday at 8:30 or maybe next wed at 42            Thanks      wilson ------

## 2014-02-21 NOTE — Telephone Encounter (Signed)
Called patient and the only time she can do was 03/02/14 @ 12:00. Deneise Lever

## 2014-02-23 ENCOUNTER — Encounter: Payer: BC Managed Care – PPO | Attending: General Surgery | Admitting: Dietician

## 2014-02-23 VITALS — Ht 68.0 in | Wt 224.0 lb

## 2014-02-23 DIAGNOSIS — Z713 Dietary counseling and surveillance: Secondary | ICD-10-CM | POA: Insufficient documentation

## 2014-02-23 NOTE — Patient Instructions (Addendum)
Goals:  Follow Phase 3B: High Protein + Non-Starchy Vegetables  Eat 3-6 small meals/snacks, every 3-5 hrs  Increase lean protein foods to meet 60g goal  Increase fluid intake to 64oz +  Avoid drinking 15 minutes before, during and 30 minutes after eating  Aim for >30 min of physical activity daily  Consider taking biotin for hair loss  Try yogurt, cottage cheese, beans, fish, veggie burger/meat (at least 10 g protein)

## 2014-02-23 NOTE — Progress Notes (Signed)
  Follow-up visit:  4 Months Post-Operative RYGB Surgery  Medical Nutrition Therapy:  Appt start time: 1200 end time:  1220.  Primary concerns today: Post-operative Bariatric Surgery Nutrition Management. Returns with a 9.5 lbs weight loss. Still struggling to eat a lot of foods. Able to eat some nuts for snacks. Still not able to tolerate chicken or pork. Able to eat some red meat and seafood. Is having nausea and vomiting if she tries to eat anything besides a few foods. Has tried salad which was ok 2 times. Planning to see her surgeon next week.   Still taking zofran as needed. Drinking protein shakes for breakfast and lunch.    Starting to feel a little hungry in the last week.   Surgery date: 11/02/2013  Surgery type: RYGB  Start weight at [facility]: 291 on 05/24/13  Weight today: 224 lbs   Weight change: 9.5 lbs Total weight loss: 66 lbs   TANITA BODY COMP RESULTS   10/28/2013  11/16/13  01/17/14 02/23/14  BMI (kg/m^2)  42.4  40.4  35.7 34.1  Fat Mass (lbs)  151  140.5  106.5 96.0  Fat Free Mass (lbs)  128  125.0  128.0 128.0  Total Body Water (lbs)  93.5  91.5  93.5 93.5    Preferred Learning Style:   No preference indicated   Learning Readiness:   Ready  24-hr recall: B (AM): Premier (30 g) Snk ( AM): none  L (PM): Premier (30 g) Snk ( PM): nuts, or a piece of cheese  D (PM): cooked vegetables and 2 oz protein (14 g)  Snk (PM): sugar free popsicle or fudgsicle   Fluid intake: 32-40 oz decaf unsweet iced tea, 8 oz water, 22 oz protein shake 62-70 oz fluid (struggling some days if she doesn't go outside) Estimated total protein intake: 74 g  Medications: see list Supplementation: taking, better taking them during the week than weekend  Using straws: Sometimes (has sensitive teeth) Drinking while eating: No Hair loss: Yes Carbonated beverages: No N/V/D/C: having quite a bit of nausea and vomiting if she deviates from certain foods, not as much constipation Dumping  syndrome: No  Recent physical activity:  20 minutes swimming and 45 minutes biking 3-4 x week   Progress Towards Goal(s):  In progress   Nutritional Diagnosis:  [place]-3.3 Overweight/obesity related to past poor dietary habits and physical inactivity as evidenced by patient w/ recent RYGB surgery following dietary guidelines for continued weight loss.    Intervention:  Nutrition education/diet reinforcement.  Teaching Method Utilized:  Visual Auditory Hands on  Barriers to learning/adherence to lifestyle change: hx of nausea and vomiting, difficulty tolerating protein foods  Demonstrated degree of understanding via:  Teach Back   Monitoring/Evaluation:  Dietary intake, exercise, and body weight. Follow up in 2 months for 6 month post-op visit.

## 2014-03-02 ENCOUNTER — Encounter (INDEPENDENT_AMBULATORY_CARE_PROVIDER_SITE_OTHER): Payer: Self-pay | Admitting: General Surgery

## 2014-03-02 ENCOUNTER — Ambulatory Visit (INDEPENDENT_AMBULATORY_CARE_PROVIDER_SITE_OTHER): Payer: BC Managed Care – PPO | Admitting: General Surgery

## 2014-03-02 VITALS — BP 124/83 | HR 68 | Temp 97.1°F | Resp 14 | Ht 68.0 in | Wt 220.2 lb

## 2014-03-02 DIAGNOSIS — Z9884 Bariatric surgery status: Secondary | ICD-10-CM

## 2014-03-02 DIAGNOSIS — E669 Obesity, unspecified: Secondary | ICD-10-CM

## 2014-03-02 MED ORDER — SUCRALFATE 1 GM/10ML PO SUSP: 1.0000 g | Freq: Three times a day (TID) | ORAL | Status: DC

## 2014-03-02 NOTE — Patient Instructions (Signed)
Try changing up the intensity of your exercise. Get your labs drawn Try taking the carrafate and see if you notice any improvement If tolerance to solids worsens, I need to know See you in 3 months Continue to take your supplements daiy  Eating techniques 20-20-20 (30-30-30) 20 chews, 20 seconds between bites of food, 20 minutes to eat; sometimes you may need 30 chews, 30 seconds etc Use your nondominant hand to eat with Put fork down between bites of food Use a timer after swallowing to reinforce waiting 20-30 sec between bites of food Use a child/infant size utensil Try not to eat while watching TV

## 2014-03-02 NOTE — Progress Notes (Signed)
Subjective:     Patient ID: [name], female   DOB: 18-Apr-1971, 43 y.o.   MRN: [id number]  HPI 43 year old Caucasian female comes in for followup after undergoing laparoscopic Roux-en-Y gastric bypass surgery on 11/02/2013. I last saw her in April 2015. Her weight at that time was 225.8 pounds. She is having some issues with her left knee and then getting injections. She is exercising about 3 times a week for about 45 minutes of the time. She is bicycling as well as swimming. She still has intolerance to a large number of meats. She will often regurgitate chicken and steak. She is able to tolerate fish, ground beef. When she tries to use something like chicken or pork that will cause extreme nausea, dry heaving, white foamies, and just sit there. She denies any abdominal pain. She denies any melena or hematochezia. She denies any dizziness. She reports compliance with her supplements  PMHx, PSHx, SOCHx, FAMHx, ALL reviewed and unchanged  Review of Systems A 12 point Review of systems was performed and all systems are negative except for what is mentioned in the history of present illness - also see sheet in bariatric chart     Objective:   Physical Exam BP 124/83  Pulse 68  Temp(Src) 97.1 F (36.2 C) (Oral)  Resp 14  Ht 5\' 8"  (1.727 m)  Wt 220 lb 3.2 oz (99.882 kg)  BMI 33.49 kg/m2  Gen: alert, NAD, non-toxic appearing Pupils: equal, no scleral icterus Pulm: Lungs clear to auscultation, symmetric chest rise CV: regular rate and rhythm Abd: soft, nontender, nondistended. Well-healed trocar sites. No cellulitis. No incisional hernia Ext: no edema, no calf tenderness Skin: no rash, no jaundice     Assessment:     S/p LRYGB DM2 HPL Anxiety - depression     Plan:     Total weight loss has been adequate but weight loss since last visit in April is not ideal. Food choices seem appropriate. She is having some intolerance to certain foods. Appears she is using correct eating  techniques and behaviors. Does not appear to be typical ulcer symptoms. But i think a trial of carafate to see if it will help with white foamies/certain food intolerances. Encouraged to increase exercising to 4 times a week. Reminded about importance of supplements. We will check Surveillance labs. Followup 3 months. Advised patient that if symptoms worsen to contact the office. All questions asked and answered  Leighton Ruff. Redmond Pulling, MD, FACS General, Bariatric, & Minimally Invasive Surgery [facility], [place]

## 2014-03-04 ENCOUNTER — Ambulatory Visit (INDEPENDENT_AMBULATORY_CARE_PROVIDER_SITE_OTHER): Payer: BC Managed Care – PPO | Admitting: General Surgery

## 2014-03-22 ENCOUNTER — Encounter (INDEPENDENT_AMBULATORY_CARE_PROVIDER_SITE_OTHER): Payer: Self-pay | Admitting: General Surgery

## 2014-03-25 ENCOUNTER — Other Ambulatory Visit (INDEPENDENT_AMBULATORY_CARE_PROVIDER_SITE_OTHER): Payer: Self-pay | Admitting: General Surgery

## 2014-03-25 LAB — CBC WITH DIFFERENTIAL/PLATELET
Basophils Absolute: 0.1 10*3/uL (ref 0.0–0.1)
Basophils Relative: 1 % (ref 0–1)
EOS PCT: 4 % (ref 0–5)
Eosinophils Absolute: 0.3 10*3/uL (ref 0.0–0.7)
HCT: 41.4 % (ref 36.0–46.0)
HEMOGLOBIN: 13.9 g/dL (ref 12.0–15.0)
LYMPHS ABS: 2.1 10*3/uL (ref 0.7–4.0)
LYMPHS PCT: 27 % (ref 12–46)
MCH: 30 pg (ref 26.0–34.0)
MCHC: 33.6 g/dL (ref 30.0–36.0)
MCV: 89.2 fL (ref 78.0–100.0)
Monocytes Absolute: 0.6 10*3/uL (ref 0.1–1.0)
Monocytes Relative: 8 % (ref 3–12)
Neutro Abs: 4.6 10*3/uL (ref 1.7–7.7)
Neutrophils Relative %: 60 % (ref 43–77)
Platelets: 365 10*3/uL (ref 150–400)
RBC: 4.64 MIL/uL (ref 3.87–5.11)
RDW: 14.1 % (ref 11.5–15.5)
WBC: 7.6 10*3/uL (ref 4.0–10.5)

## 2014-03-25 LAB — CMP AND LIVER
ALBUMIN: 4.2 g/dL (ref 3.5–5.2)
ALK PHOS: 93 U/L (ref 39–117)
ALT: 19 U/L (ref 0–35)
AST: 18 U/L (ref 0–37)
BILIRUBIN INDIRECT: 0.4 mg/dL (ref 0.2–1.2)
BUN: 10 mg/dL (ref 6–23)
Bilirubin, Direct: 0.1 mg/dL (ref 0.0–0.3)
CALCIUM: 9.3 mg/dL (ref 8.4–10.5)
CHLORIDE: 105 meq/L (ref 96–112)
CO2: 26 mEq/L (ref 19–32)
Creat: 0.67 mg/dL (ref 0.50–1.10)
Glucose, Bld: 95 mg/dL (ref 70–99)
Potassium: 4.1 mEq/L (ref 3.5–5.3)
SODIUM: 141 meq/L (ref 135–145)
TOTAL PROTEIN: 6.3 g/dL (ref 6.0–8.3)
Total Bilirubin: 0.5 mg/dL (ref 0.2–1.2)

## 2014-03-25 LAB — LIPID PANEL
CHOL/HDL RATIO: 3.9 ratio
Cholesterol: 156 mg/dL (ref 0–200)
HDL: 40 mg/dL (ref >39)
LDL Cholesterol: 99 mg/dL (ref 0–99)
Triglycerides: 85 mg/dL (ref <150)
VLDL: 17 mg/dL (ref 0–40)

## 2014-03-25 LAB — IRON AND TIBC
%SAT: 28 % (ref 20–55)
Iron: 89 ug/dL (ref 42–145)
TIBC: 316 ug/dL (ref 250–470)
UIBC: 227 ug/dL (ref 125–400)

## 2014-03-25 LAB — MAGNESIUM: Magnesium: 2 mg/dL (ref 1.5–2.5)

## 2014-03-26 LAB — FOLATE

## 2014-03-26 LAB — VITAMIN D 25 HYDROXY (VIT D DEFICIENCY, FRACTURES): Vit D, 25-Hydroxy: 50 ng/mL (ref 30–89)

## 2014-03-26 LAB — VITAMIN B12: Vitamin B-12: 555 pg/mL (ref 211–911)

## 2014-04-04 ENCOUNTER — Ambulatory Visit (INDEPENDENT_AMBULATORY_CARE_PROVIDER_SITE_OTHER): Payer: BC Managed Care – PPO | Admitting: Family Medicine

## 2014-04-04 ENCOUNTER — Encounter: Payer: Self-pay | Admitting: Family Medicine

## 2014-04-04 VITALS — BP 102/80 | HR 74 | Temp 98.2°F | Ht 68.0 in | Wt 211.0 lb

## 2014-04-04 DIAGNOSIS — R229 Localized swelling, mass and lump, unspecified: Secondary | ICD-10-CM

## 2014-04-04 DIAGNOSIS — F32A Depression, unspecified: Secondary | ICD-10-CM

## 2014-04-04 DIAGNOSIS — F341 Dysthymic disorder: Secondary | ICD-10-CM

## 2014-04-04 DIAGNOSIS — IMO0001 Reserved for inherently not codable concepts without codable children: Secondary | ICD-10-CM

## 2014-04-04 DIAGNOSIS — F329 Major depressive disorder, single episode, unspecified: Secondary | ICD-10-CM

## 2014-04-04 DIAGNOSIS — F419 Anxiety disorder, unspecified: Secondary | ICD-10-CM

## 2014-04-04 DIAGNOSIS — N926 Irregular menstruation, unspecified: Secondary | ICD-10-CM

## 2014-04-04 DIAGNOSIS — E1165 Type 2 diabetes mellitus with hyperglycemia: Secondary | ICD-10-CM

## 2014-04-04 LAB — HEMOGLOBIN A1C: HEMOGLOBIN A1C: 5.4 % (ref 4.6–6.5)

## 2014-04-04 NOTE — Progress Notes (Signed)
Pre visit review using our clinic review tool, if applicable. No additional management support is needed unless otherwise documented below in the visit note. 

## 2014-04-04 NOTE — Progress Notes (Signed)
No chief complaint on file.   HPI:  Acute visit for:  1)Skin Bump: -reports: one behind L ear and one on upper R thigh - painless -denies: unintentional weight loss - had gastric bypass, fevers, malaise -has seen skin surgery center for skin issues in the past and due for check up per notes  2) irregular uterine bleeding -since gastric bypass -bleeds every 2-3 weeks, heavy bleeding -normal labs recently  Follow up/Chronic issues:  1)T2DM: -she did not tolerate metformin due to GI side effects, seeing endocrinologist -reports: s/p gastric bypass surgery in 10/2013 -denies: polyuria, polydipsia, vision changes, wounds on feet -last eye exam Lab Results  Component Value Date   HGBA1C 7.8* 09/16/2013   Lab Results  Component Value Date   CHOL 156 03/25/2014   HDL 40 03/25/2014   LDLCALC 99 03/25/2014   TRIG 85 03/25/2014   CHOLHDL 3.9 03/25/2014   2-3)HLD/Obesity: -I started her on statin in the past -s/p recent bariatric surgery -working on lifestyle changes  4/5) Anxiety/Depression: -meds: xanax, cymbalta, ambien -managed by Dr. Toy Care -denies: SI, thoughts of selft harm  6)GERD:  -takes pantoprozole -denies: dysphagia, nausea, vomiting  7)OA of the knees: -stable  ROS: See pertinent positives and negatives per HPI.  Past Medical History  Diagnosis Date  . Headache(784.0)     tension or sinus  . Arthritis     right knee  . Depression   . Anxiety   . Medial meniscus tear 12/2012    right knee  . History of MRSA infection prior to 2012    lasted 5-6 yr.  . Dental crowns present   . Jaw snapping     states jaw pops  . Insect bites 12/24/2012    leg  . Complication of anesthesia     has been hard to wake up post-op  . Gestational diabetes 2004  . T2DM (type 2 diabetes mellitus) 05/2013  . DVT of lower extremity (deep venous thrombosis) 2012    LLE  . Acute meniscal tear of knee 09/22/2012    Right knee.    . Chondromalacia 04/30/2013    Seen on MRI as well  as during surgery by Dr. Rhona Raider Tricompartmental degenerative changes   . Primary localized osteoarthrosis, lower leg 07/06/2013    Synvisc series started July 06, 2013     Past Surgical History  Procedure Laterality Date  . Hand surgery Left     X 3 - spider bite and MRSA infection  . Wrist surgery Right 2005  . Knee arthroscopy Right 12/29/2012    Procedure: RIGHT KNEE ARTHROSCOPY  PARTIAL MEDIAL AND LATERAL MENISECTOMY WITH CHONDROPLASTY;  Surgeon: Hessie Dibble, MD;  Location: [facility];  Service: Orthopedics;  Laterality: Right;  . Tonsillectomy  as child  . Cholecystectomy  1990's  . Gastric roux-en-y N/A 11/02/2013    Procedure: LAPAROSCOPIC ROUX-EN-Y GASTRIC BYPASS WITH UPPER ENDOSCOPY;  Surgeon: Gayland Curry, MD;  Location: [facility];  Service: General;  Laterality: N/A;    Family History  Problem Relation Age of Onset  . Diabetes Mother   . Kidney disease Mother   . Hypertension Mother   . Cancer Mother     breast cancer  . Cancer Maternal Grandmother   . Diabetes Maternal Grandmother     History   Social History  . Marital Status: Single    Spouse Name: N/A    Number of Children: N/A  . Years of Education: N/A   Social History Main  Topics  . Smoking status: Never Smoker   . Smokeless tobacco: Never Used  . Alcohol Use: Yes     Comment: 1-2 glasses of wine a few times per month  . Drug Use: No  . Sexual Activity: No   Other Topics Concern  . None   Social History Narrative   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[employer] Situation: lives with daughter      Spiritual Beliefs: none      Lifestyle: no regular exercising; diet is so so      Caffeine use: daily                Current outpatient prescriptions:acetaminophen (TYLENOL) 500 MG tablet, Take 1,000 mg by mouth every 6 (six) hours as needed for mild pain., Disp: , Rfl: ;  alprazolam (XANAX) 2 MG tablet, Take 2 mg by mouth 2 (two) times daily as needed for anxiety. ,  Disp: , Rfl: ;  Diclofenac Sodium (PENNSAID) 2 % SOLN, Place 2 application onto the skin 2 (two) times daily., Disp: 112 g, Rfl: 3 DULoxetine (CYMBALTA) 60 MG capsule, Take 120 mg by mouth every morning. , Disp: , Rfl: ;  minoxidil (ROGAINE) 2 % external solution, Apply 1 application topically daily., Disp: , Rfl: ;  ondansetron (ZOFRAN) 4 MG tablet, TAKE 1 TABLET (4 MG TOTAL) BY MOUTH EVERY 8 (EIGHT) HOURS AS NEEDED FOR NAUSEA OR VOMITING., Disp: 9 tablet, Rfl: 0 sucralfate (CARAFATE) 1 GM/10ML suspension, Take 10 mLs (1 g total) by mouth 4 (four) times daily -  with meals and at bedtime., Disp: 420 mL, Rfl: 0;  zolpidem (AMBIEN) 10 MG tablet, Take 10 mg by mouth at bedtime. , Disp: , Rfl:   EXAM:  Filed Vitals:   04/04/14 1339  BP: 102/80  Pulse: 74  Temp: 98.2 F (36.8 C)    Body mass index is 32.09 kg/(m^2).  GENERAL: vitals reviewed and listed above, alert, oriented, appears well hydrated and in no acute distress  HEENT: atraumatic, conjunttiva clear, no obvious abnormalities on inspection of external nose and ears - 60mm irr sub cutaneous mass behind L ear, mobile, mixed density; 20x97mm somewhat fixed superficial subcutaneous mass R upper ant thigh  NECK: no obvious masses on inspection  LUNGS: clear to auscultation bilaterally, no wheezes, rales or rhonchi, good air movement  CV: HRRR, no peripheral edema  MS: moves all extremities without noticeable abnormality  PSYCH: pleasant and cooperative, no obvious depression or anxiety  ASSESSMENT AND PLAN:  Discussed the following assessment and plan:  Subcutaneous mass -we discussed possible serious and likely etiologies, workup and treatment, treatment risks and return precautions -after this discussion, Annleigh opted for contacting her dermatologist for biopsy/removal - but will call us if they are unable to do this promptly -of course, we advised Santasia  to return or notify a doctor immediately if symptoms worsen or persist  or new concerns arise.  Irregular uterine bleeding -she will schedule appt with gyn regarding this and removal of iud  Diabetes mellitus type 2, uncontrolled, without complications - Plan: Hemoglobin A1c  Anxiety and depression, sees Dr. Toy Care whom prescripes her psych and sleep medications  -Patient advised to return or notify a doctor immediately if symptoms worsen or persist or new concerns arise.  Patient Instructions  -call gynecologist for an appointment  -let us know if you want a referral to a surgeon regarding the nodules - I do advise you to have these removed promptly  --  We have ordered labs or studies at this visit. It can take up to 1-2 weeks for results and processing. We will contact you with instructions IF your results are abnormal. Normal results will be released to your [facility]. If you have not heard from [place] or can not find your results in [facility] in 2 weeks please contact our office.  -follow up 3 months          KIM, HANNAH R.

## 2014-04-04 NOTE — Patient Instructions (Signed)
-  call gynecologist for an appointment  -let us know if you want a referral to a surgeon regarding the nodules - I do advise you to have these removed promptly  --We have ordered labs or studies at this visit. It can take up to 1-2 weeks for results and processing. We will contact you with instructions IF your results are abnormal. Normal results will be released to your [facility]. If you have not heard from [place] or can not find your results in [facility] in 2 weeks please contact our office.  -follow up 3 months

## 2014-04-21 ENCOUNTER — Telehealth: Payer: Self-pay | Admitting: *Deleted

## 2014-04-21 ENCOUNTER — Encounter: Payer: Self-pay | Admitting: Family Medicine

## 2014-04-21 ENCOUNTER — Other Ambulatory Visit: Payer: Self-pay | Admitting: Family Medicine

## 2014-04-21 DIAGNOSIS — N926 Irregular menstruation, unspecified: Secondary | ICD-10-CM

## 2014-04-21 NOTE — Progress Notes (Unsigned)
Referral sent per her request to gyn. Did she find out what she is going to do about the nodules?

## 2014-04-21 NOTE — Progress Notes (Signed)
I left a message at the pts home number to return my call. 

## 2014-04-21 NOTE — Telephone Encounter (Signed)
I called the pt back to clarify that she is seeing a gynecologist on 10/8 and she stated she is not seeing a gynecologist, she will be seeing a dermatologist for the nodules on this date.  Message forwarded to Dr Maudie Mercury.

## 2014-04-21 NOTE — Progress Notes (Signed)
I spoke with the pt and she stated she will see the gynecologist on 05/19/2014 and will call back after that appt.  Message forwarded to Dr Maudie Mercury.

## 2014-04-26 ENCOUNTER — Ambulatory Visit: Payer: BC Managed Care – PPO | Admitting: Dietician

## 2014-04-29 ENCOUNTER — Ambulatory Visit (INDEPENDENT_AMBULATORY_CARE_PROVIDER_SITE_OTHER): Payer: BC Managed Care – PPO | Admitting: Obstetrics and Gynecology

## 2014-04-29 ENCOUNTER — Encounter: Payer: Self-pay | Admitting: Obstetrics and Gynecology

## 2014-04-29 VITALS — BP 120/78 | HR 64 | Resp 18 | Ht 67.5 in | Wt 201.0 lb

## 2014-04-29 DIAGNOSIS — N921 Excessive and frequent menstruation with irregular cycle: Secondary | ICD-10-CM | POA: Insufficient documentation

## 2014-04-29 DIAGNOSIS — R82998 Other abnormal findings in urine: Secondary | ICD-10-CM

## 2014-04-29 DIAGNOSIS — Z01419 Encounter for gynecological examination (general) (routine) without abnormal findings: Secondary | ICD-10-CM

## 2014-04-29 DIAGNOSIS — N6459 Other signs and symptoms in breast: Secondary | ICD-10-CM

## 2014-04-29 DIAGNOSIS — Z0000 Encounter for general adult medical examination without abnormal findings: Secondary | ICD-10-CM

## 2014-04-29 DIAGNOSIS — R8281 Pyuria: Secondary | ICD-10-CM

## 2014-04-29 DIAGNOSIS — N920 Excessive and frequent menstruation with regular cycle: Secondary | ICD-10-CM

## 2014-04-29 DIAGNOSIS — N6452 Nipple discharge: Secondary | ICD-10-CM

## 2014-04-29 LAB — POCT URINALYSIS DIPSTICK
Bilirubin, UA: NEGATIVE
Blood, UA: NEGATIVE
Glucose, UA: NEGATIVE
Ketones, UA: NEGATIVE
Nitrite, UA: NEGATIVE
PH UA: 5
PROTEIN UA: NEGATIVE
Urobilinogen, UA: NEGATIVE

## 2014-04-29 NOTE — Progress Notes (Signed)
Patient ID: [name], female   DOB: 1971/07/08, 43 y.o.   MRN: [id number] GYNECOLOGY VISIT  PCP:   Colin Benton, DO  Referring provider:   HPI: 43 y.o.   Single  Caucasian  female   [id number] with Patient's last menstrual period was 04/16/2014.   here for  Heavy menstrual cycles with Mirena IUD.  Had gastric bypass surgery this spring.   Lost 95 pounds.  Wants to loose 15 pounds more. Now with weight loss, having cycles that are heavy and 2 - 3 weeks apart.  Painful cycles.   This is the patient's second Mirena IUD.  No prior ultrasound.   History of endometriosis and had a laparoscopy.  History of infertility. Hgb 13.9 on 03/25/14.   Had a DVT on 2012 while on combined birth control pills. Had surgery during this time for chronic MRSA of the left hand. Not currently on anticoagulation.   History of right nipple discharge from 1 - 2 spots of the nipple.  Color ranges from blue to grey or white.  Taking multivitamins, calcium, and vitamin D, B12.   Hgb:    --- Urine:  1+ WBC's - some suprapubic discomfort.  Last UTI was years ago.   GYNECOLOGIC HISTORY: Patient's last menstrual period was 04/16/2014. Sexually active:  no Partner preference:  female Contraception:  None--Mirena IUD inserted 05/2011 for heavy cycles Menopausal hormone therapy: n/a DES exposure:  no Blood transfusions:  no  Sexually transmitted diseases:  no  GYN procedures and prior surgeries:  Laparoscopy 1990's--endometriosis Last mammogram: 06-29-13 wnl [facility]                Last pap and high risk HPV testing: 04-23-13 wnl:no HR HPV testing   History of abnormal pap smear:  no   OB History   Grav Para Term Preterm Abortions TAB SAB Ect Mult Living   3 1 1  2  2   1        LIFESTYLE: Exercise:   Cycling, swimming and walking            Tobacco:   no Alcohol:     1 glass of wine per week Drug use:  no  Patient Active Problem List   Diagnosis Date Noted  . S/P gastric bypass  11/02/2013  . Obesity, Class III, BMI 40-49.9 (morbid obesity) 10/21/2013  . Hyperlipidemia 10/08/2013  . Primary localized osteoarthrosis, lower leg 07/06/2013  . Obesity, BMI unknown 04/23/2013  . Anxiety and depression, sees Dr. Toy Care whom prescripes her psych and sleep medications 04/09/2013  . Insomnia 04/09/2013  . Diabetes mellitus type 2, uncontrolled, without complications 35/36/1443  . IUD contraception - inserted summer 2013 04/09/2013    Past Medical History  Diagnosis Date  . Headache(784.0)     tension or sinus  . Arthritis     right knee  . Depression   . Anxiety   . Medial meniscus tear 12/2012    right knee  . History of MRSA infection prior to 2012    lasted 5-6 yr.  . Dental crowns present   . Jaw snapping     states jaw pops  . Insect bites 12/24/2012    leg  . Complication of anesthesia     has been hard to wake up post-op  . Gestational diabetes 2004  . T2DM (type 2 diabetes mellitus) 05/2013  . DVT of lower extremity (deep venous thrombosis) 2012    LLE  . Acute meniscal tear of  knee 09/22/2012    Right knee.    . Chondromalacia 04/30/2013    Seen on MRI as well as during surgery by Dr. Rhona Raider Tricompartmental degenerative changes   . Primary localized osteoarthrosis, lower leg 07/06/2013    Synvisc series started July 06, 2013   . Dysmenorrhea   . Endometriosis   . Fibroid     Past Surgical History  Procedure Laterality Date  . Hand surgery Left     X 3 - spider bite and MRSA infection  . Wrist surgery Right 2005  . Knee arthroscopy Right 12/29/2012    Procedure: RIGHT KNEE ARTHROSCOPY  PARTIAL MEDIAL AND LATERAL MENISECTOMY WITH CHONDROPLASTY;  Surgeon: Hessie Dibble, MD;  Location: [facility];  Service: Orthopedics;  Laterality: Right;  . Tonsillectomy  as child  . Cholecystectomy  1990's  . Gastric roux-en-y N/A 11/02/2013    Procedure: LAPAROSCOPIC ROUX-EN-Y GASTRIC BYPASS WITH UPPER ENDOSCOPY;  Surgeon: Gayland Curry, MD;  Location: [facility];  Service: General;  Laterality: N/A;  . Pelvic laparoscopy  1990's    d/t endometriosis    Current Outpatient Prescriptions  Medication Sig Dispense Refill  . acetaminophen (TYLENOL) 500 MG tablet Take 1,000 mg by mouth every 6 (six) hours as needed for mild pain.      Marland Kitchen alprazolam (XANAX) 2 MG tablet Take 2 mg by mouth 2 (two) times daily as needed for anxiety.       . Diclofenac Sodium (PENNSAID) 2 % SOLN Place 2 application onto the skin 2 (two) times daily.  112 g  3  . DULoxetine (CYMBALTA) 60 MG capsule Take 120 mg by mouth every morning.       Marland Kitchen levonorgestrel (MIRENA) 20 MCG/24HR IUD 1 each by Intrauterine route once.      . ondansetron (ZOFRAN) 4 MG tablet TAKE 1 TABLET (4 MG TOTAL) BY MOUTH EVERY 8 (EIGHT) HOURS AS NEEDED FOR NAUSEA OR VOMITING.  9 tablet  0  . zolpidem (AMBIEN) 10 MG tablet Take 10 mg by mouth at bedtime.        No current facility-administered medications for this visit.     ALLERGIES: Sulfa antibiotics and Bactrim  Family History  Problem Relation Age of Onset  . Diabetes Mother   . Kidney disease Mother   . Hypertension Mother   . Cancer Mother     breast cancer  . Breast cancer Mother 18    dx'd with breast ca x2  . Cancer Maternal Grandmother   . Diabetes Maternal Grandmother 52    dec  . Breast cancer Maternal Grandmother   . Diabetes Maternal Grandfather   . Stroke Maternal Grandfather     History   Social History  . Marital Status: Single    Spouse Name: N/A    Number of Children: N/A  . Years of Education: N/A   Occupational History  . Not on file.   Social History Main Topics  . Smoking status: Never Smoker   . Smokeless tobacco: Never Used  . Alcohol Use: 0.5 oz/week    1 drink(s) per week     Comment: 1-2 glasses of wine a few times per month  . Drug Use: No  . Sexual Activity: No     Comment: same sex partner--has Mirena IUD inserted 05/2011   Other Topics Concern  . Not on file    Social History Narrative   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[place] Situation:  lives with daughter      Spiritual Beliefs: none      Lifestyle: no regular exercising; diet is so so      Caffeine use: daily                ROS:  Pertinent items are noted in HPI.  PHYSICAL EXAMINATION:    BP 120/78  Pulse 64  Resp 18  Ht 5' 7.5" (1.715 m)  Wt 201 lb (91.173 kg)  BMI 31.00 kg/m2  LMP 04/16/2014   Wt Readings from Last 3 Encounters:  04/29/14 201 lb (91.173 kg)  04/04/14 211 lb (95.709 kg)  03/02/14 220 lb 3.2 oz (99.882 kg)     Ht Readings from Last 3 Encounters:  04/29/14 5' 7.5" (1.715 m)  04/04/14 5\' 8"  (1.727 m)  03/02/14 5\' 8"  (1.727 m)    General appearance: alert, cooperative and appears stated age Head: Normocephalic, without obvious abnormality, atraumatic Neck: no adenopathy, supple, symmetrical, trachea midline and thyroid not enlarged, symmetric, no tenderness/mass/nodules Lungs: clear to auscultation bilaterally Breasts: Inspection negative, No nipple retraction or dimpling, No nipple discharge or bleeding, No axillary or supraclavicular adenopathy, Normal to palpation without dominant masses Heart: regular rate and rhythm Abdomen: soft, non-tender; no masses,  no organomegaly Extremities: extremities normal, atraumatic, no cyanosis or edema Skin: Skin color, texture, turgor normal. No rashes or lesions Lymph nodes: Cervical, supraclavicular, and axillary nodes normal. No abnormal inguinal nodes palpated Neurologic: Grossly normal  Pelvic: External genitalia:  no lesions              Urethra:  normal appearing urethra with no masses, tenderness or lesions              Bartholins and Skenes: normal                 Vagina: normal appearing vagina with normal color and discharge, no lesions              Cervix: normal.  IUD strings noted.  Mucousy discharge noted.                  Bimanual Exam:  Uterus:  uterus is normal size, shape,  consistency and nontender                                      Adnexa: normal adnexa in size, nontender and no masses                                      Rectovaginal: Confirms                                      Anus:  normal sphincter tone, no lesions  ASSESSMENT  Menometrorrhagia.  Dysmenorrhea. History of right nipple discharge.  Mirena IUD patient.  History of DVT on combined OCPs.  Pyuria.  Contaminant from vaginal discharge? Status post gastric bypass.  History of endometriosis.   PLAN  Check TSH and prolactin.  Return for pelvic ultrasound and possible EMB.  Mammogram in November.  Do self breat exams.  On calcium and Vit D already.   An After Visit Summary was printed and given to the patient.

## 2014-04-29 NOTE — Patient Instructions (Signed)

## 2014-04-30 LAB — TSH: TSH: 1.161 u[IU]/mL (ref 0.350–4.500)

## 2014-04-30 LAB — PROLACTIN: Prolactin: 15.2 ng/mL

## 2014-05-01 LAB — URINE CULTURE: Colony Count: 30000

## 2014-05-04 ENCOUNTER — Telehealth: Payer: Self-pay | Admitting: Obstetrics and Gynecology

## 2014-05-04 LAB — IPS PAP TEST WITH HPV

## 2014-05-04 NOTE — Telephone Encounter (Signed)
Spoke with patient. Advised that per benefit quote received, she will be responsible to pay $15 copay when she comes in for PUS/EMB. Patient agreeable. Scheduled procedures. Advised patient of 72 hour cancellation policy and $409 cancellation fee. Patient agreeable.

## 2014-05-12 ENCOUNTER — Ambulatory Visit (INDEPENDENT_AMBULATORY_CARE_PROVIDER_SITE_OTHER): Payer: BC Managed Care – PPO | Admitting: Obstetrics and Gynecology

## 2014-05-12 ENCOUNTER — Ambulatory Visit (INDEPENDENT_AMBULATORY_CARE_PROVIDER_SITE_OTHER): Payer: BC Managed Care – PPO

## 2014-05-12 ENCOUNTER — Encounter: Payer: Self-pay | Admitting: Obstetrics and Gynecology

## 2014-05-12 VITALS — BP 110/62 | HR 54 | Ht 67.5 in | Wt 199.0 lb

## 2014-05-12 DIAGNOSIS — N921 Excessive and frequent menstruation with irregular cycle: Secondary | ICD-10-CM

## 2014-05-12 NOTE — Patient Instructions (Signed)
Endometrial Biopsy, Care After Refer to this sheet in the next few weeks. These instructions provide you with information on caring for yourself after your procedure. Your health care provider may also give you more specific instructions. Your treatment has been planned according to current medical practices, but problems sometimes occur. Call your health care provider if you have any problems or questions after your procedure. WHAT TO EXPECT AFTER THE PROCEDURE After your procedure, it is typical to have the following:  You may have mild cramping and a small amount of vaginal bleeding for a few days after the procedure. This is normal. HOME CARE INSTRUCTIONS  Only take over-the-counter or prescription medicine as directed by your health care provider.  Do not douche, use tampons, or have sexual intercourse until your health care provider approves.  Follow your health care provider's instructions regarding any activity restrictions, such as strenuous exercise or heavy lifting. SEEK MEDICAL CARE IF:  You have heavy bleeding or bleeding longer than 2 days after the procedure.  You have bad smelling drainage from your vagina.  You have a fever and chills.  Youhave severe lower stomach (abdominal) pain. SEEK IMMEDIATE MEDICAL CARE IF:  You have severe cramps in your stomach or back.  You pass large blood clots.  Your bleeding increases.  You become weak or lightheaded, or you pass out. Document Released: 05/19/2013 Document Reviewed: 05/19/2013 [facility] Patient Information 2015 [place], [place]. This information is not intended to replace advice given to you by your health care provider. Make sure you discuss any questions you have with your health care provider.  Endometrial Ablation Endometrial ablation removes the lining of the uterus (endometrium). It is usually a same-day, outpatient treatment. Ablation helps avoid major surgery, such as surgery to remove the cervix and uterus  (hysterectomy). After endometrial ablation, you will have little or no menstrual bleeding and may not be able to have children. However, if you are premenopausal, you will need to use a reliable method of birth control following the procedure because of the small chance that pregnancy can occur. There are different reasons to have this procedure, which include:  Heavy periods.  Bleeding that is causing anemia.  Irregular bleeding.  Bleeding fibroids on the lining inside the uterus if they are smaller than 3 centimeters. This procedure should not be done if:  You want children in the future.  You have severe cramps with your menstrual period.  You have precancerous or cancerous cells in your uterus.  You were recently pregnant.  You have gone through menopause.  You have had major surgery on the uterus, such as a cesarean delivery. LET [facility] CARE PROVIDER KNOW ABOUT:  Any allergies you have.  All medicines you are taking, including vitamins, herbs, eye drops, creams, and over-the-counter medicines.  Previous problems you or members of your family have had with the use of anesthetics.  Any blood disorders you have.  Previous surgeries you have had.  Medical conditions you have. RISKS AND COMPLICATIONS  Generally, this is a safe procedure. However, as with any procedure, complications can occur. Possible complications include:  Perforation of the uterus.  Bleeding.  Infection of the uterus, bladder, or vagina.  Injury to surrounding organs.  An air bubble to the lung (air embolus).  Pregnancy following the procedure.  Failure of the procedure to help the problem, requiring hysterectomy.  Decreased ability to diagnose cancer in the lining of the uterus. BEFORE THE PROCEDURE  The lining of the uterus must be  tested to make sure there is no pre-cancerous or cancer cells present.  An ultrasound may be performed to look at the size of the uterus and to check  for abnormalities.  Medicines may be given to thin the lining of the uterus. PROCEDURE  During the procedure, your health care provider will use a tool called a resectoscope to help see inside your uterus. There are different ways to remove the lining of your uterus.   Radiofrequency - This method uses a radiofrequency-alternating electric current to remove the lining of the uterus.  Cryotherapy - This method uses extreme cold to freeze the lining of the uterus.  Heated-Free Liquid - This method uses heated salt (saline) solution to remove the lining of the uterus.  Microwave - This method uses high-energy microwaves to heat up the lining of the uterus to remove it.  Thermal balloon - This method involves inserting a catheter with a balloon tip into the uterus. The balloon tip is filled with heated fluid to remove the lining of the uterus. AFTER THE PROCEDURE  After your procedure, do not have sexual intercourse or insert anything into your vagina until permitted by your health care provider. After the procedure, you may experience:  Cramps.  Vaginal discharge.  Frequent urination. Document Released: 06/07/2004 Document Revised: 03/31/2013 Document Reviewed: 12/30/2012 [facility] Patient Information 2015 [place], [place]. This information is not intended to replace advice given to you by your health care provider. Make sure you discuss any questions you have with your health care provider.

## 2014-05-12 NOTE — Progress Notes (Signed)
Subjective  43 y.o. Single Caucasian female  [phone] with Patient's last menstrual period was 04/16/2014.  here for Patient is here today for pelvic ultrasound and endometrial biopsy.  Heavy menstrual cycles with Mirena IUD. Heavy cycles since February 2015.   History of gastric bypass surgery.  History of laparoscopy for endometriosis.    Normal TSH.  History of DVT on combined oral contraceptives.   Having pain like her cycle may come on.  Usually does not have pain.   Objective  See ultrasound below - images and report reviewed with patient.  Uterus  - no masses, EMS 2.72 mm, IUD in canal. Right ovary with 27 mm simple cyst and 20 mm corpus luteus cyst.  Left ovary normal. No free fluid.      Procedure - endometrial biopsy Consent performed. Speculum place in vagina.  Sterile prep of cervix with Hibiclens.  Tenaculum to anterior cervical lip.  Paracervical block with 1% lidocaine, 10 cc, expiration 01/11/15, lot number [id number]. Pipelle placed to  7        cm without difficulty twice. Tissue obtained and sent to pathology - [facility]. Speculum removed.  No complications.  Assessment  Menorrhagia.  Mirena IUD patient.  History of endometriosis.  Status post gastric bypass.   Plan  Precautions for endometrial biopsy given.  Follow up biopsy result. Discussed options including Depo Provera, endometrial ablation +/- laparoscopy, hysterectomy +/- oophorectomy - risks and benefits.  Plan for endometrial ablation with hysteroscopy and dilation and curettage.  Risks include but are not limited to bleeding, infection, damage to surrounding organs including uterine perforation requiring laparoscopy with treatment of bleeding, DVT, PE, death, reaction to anesthesia, pulmonary edema.  Patient wishes to proceed with scheduling.   15 minutes face to face time of which over 50% was spent in counseling.   After visit summary to patient.

## 2014-05-16 ENCOUNTER — Telehealth: Payer: Self-pay | Admitting: *Deleted

## 2014-05-16 NOTE — Telephone Encounter (Signed)
Call to patient, She had not yet received result from My Chart but is please  To have negative result now.  Discussed date preferences. October 19 or 27 would work for her. Will work on schedule and call her back.

## 2014-05-16 NOTE — Telephone Encounter (Signed)
Message copied by Jaymes Graff on Mon May 16, 2014  1:34 PM ------      Message from: Judeth Horn DE Barbaraann Faster      Created: Sat May 14, 2014  7:49 AM       Gay Filler,            Please contact patient regarding her endometrial ablation/D&C procedure.  This could be a good case for 10/19 if you have not already selected another date with her.             Please also confirm that she received the results of the negative endometrial biopsy.  I did release it to her through My Chart to facilitate her receiving the good news!            Thanks.            Cc - Marisa Sprinkles. ------

## 2014-05-18 NOTE — Telephone Encounter (Signed)
Call to patient, advised care request for 05-30-14 is pending and will confirm with her in am.

## 2014-05-19 ENCOUNTER — Encounter (HOSPITAL_COMMUNITY): Payer: Self-pay | Admitting: *Deleted

## 2014-05-23 ENCOUNTER — Telehealth: Payer: Self-pay | Admitting: *Deleted

## 2014-05-23 ENCOUNTER — Encounter: Payer: Self-pay | Admitting: Obstetrics and Gynecology

## 2014-05-23 ENCOUNTER — Encounter (HOSPITAL_COMMUNITY): Payer: Self-pay | Admitting: Pharmacist

## 2014-05-23 NOTE — Telephone Encounter (Signed)
Dr Quincy Simmonds, on 05-12-14, patient had PUS appointment and it looks like risks and benefits were discussed. Does this patient need surgery consult? Her AEX was 04-29-14 and surgery is scheduled on 05-30-14. Please advise.

## 2014-05-23 NOTE — Telephone Encounter (Signed)
Call to patient to finalize surgery instructions, LMTCB.

## 2014-05-24 NOTE — Telephone Encounter (Signed)
Call to patient, LMTCB

## 2014-05-24 NOTE — Telephone Encounter (Signed)
Returning a call to Sally. °

## 2014-05-24 NOTE — Telephone Encounter (Signed)
Thanks for setting up the preop visit.  I will need to do some blood work at that visit. After reviewing her risk factors for DVT, I will need to do a prophylactic dosage of Lovenox the day of surgery.  I will discuss this with her at her preop visit.

## 2014-05-24 NOTE — Telephone Encounter (Signed)
Patient returned call. Surgical instructions reviewed and will give printed instruction sheet at consult appointment scheduled for 05-26-14.   Routing to provider for final review. Patient agreeable to disposition. Will close encounter

## 2014-05-24 NOTE — Telephone Encounter (Signed)
Call back to patient, LMTCB. 

## 2014-05-26 ENCOUNTER — Encounter: Payer: Self-pay | Admitting: Obstetrics and Gynecology

## 2014-05-26 ENCOUNTER — Ambulatory Visit (INDEPENDENT_AMBULATORY_CARE_PROVIDER_SITE_OTHER): Payer: BC Managed Care – PPO | Admitting: Obstetrics and Gynecology

## 2014-05-26 ENCOUNTER — Telehealth: Payer: Self-pay | Admitting: *Deleted

## 2014-05-26 VITALS — BP 100/70 | HR 60 | Ht 67.5 in | Wt 197.8 lb

## 2014-05-26 DIAGNOSIS — N921 Excessive and frequent menstruation with irregular cycle: Secondary | ICD-10-CM

## 2014-05-26 DIAGNOSIS — Z86718 Personal history of other venous thrombosis and embolism: Secondary | ICD-10-CM

## 2014-05-26 LAB — CBC
HCT: 42.1 % (ref 36.0–46.0)
HEMOGLOBIN: 13.9 g/dL (ref 12.0–15.0)
MCH: 29.6 pg (ref 26.0–34.0)
MCHC: 33 g/dL (ref 30.0–36.0)
MCV: 89.8 fL (ref 78.0–100.0)
Platelets: 406 10*3/uL — ABNORMAL HIGH (ref 150–400)
RBC: 4.69 MIL/uL (ref 3.87–5.11)
RDW: 13.6 % (ref 11.5–15.5)
WBC: 7.3 10*3/uL (ref 4.0–10.5)

## 2014-05-26 LAB — BASIC METABOLIC PANEL
BUN: 12 mg/dL (ref 6–23)
CALCIUM: 9.7 mg/dL (ref 8.4–10.5)
CO2: 28 meq/L (ref 19–32)
CREATININE: 0.6 mg/dL (ref 0.50–1.10)
Chloride: 102 mEq/L (ref 96–112)
GLUCOSE: 76 mg/dL (ref 70–99)
Potassium: 4.5 mEq/L (ref 3.5–5.3)
SODIUM: 139 meq/L (ref 135–145)

## 2014-05-26 LAB — PROTIME-INR
INR: 1.14 (ref <1.50)
PROTHROMBIN TIME: 14.6 s (ref 11.6–15.2)

## 2014-05-26 MED ORDER — OXYCODONE-ACETAMINOPHEN 5-325 MG/5ML PO SOLN: 5.0000 mL | ORAL | Status: DC | PRN

## 2014-05-26 NOTE — Patient Instructions (Signed)
Please arrive at the hospital at least 2 hours prior to surgery so that you may receive the Lovenox 40 mg injection prior to surgery.

## 2014-05-26 NOTE — Telephone Encounter (Signed)
Call to patient, notified of surgery time change to 0730 and needs to arrive at 0600 on Monday, 05-30-14. Patient agreeable.  Routing to provider for final review. Patient agreeable to disposition. Will close encounter

## 2014-05-26 NOTE — Progress Notes (Signed)
Patient ID: [name], female   DOB: Dec 18, 1970, 43 y.o.   MRN: [id number] GYNECOLOGY  VISIT   HPI: 43 y.o.   Single  Caucasian  female   [phone] with Patient's last menstrual period was 05/15/2014.   here to be evaluated for surgery.   Having heavy and irregular menses despite Mirena.  Pelvic ultrasound - normal uterus.  EMB benign inactive endometrium.   History of DVT on combined oral contraceptives.   Declines future childbearing.  Same sex partner.  GYNECOLOGIC HISTORY: Patient's last menstrual period was 05/15/2014. Contraception: Mirena IUD -- same sex partner  Menopausal hormone therapy: n/a        OB History   Grav Para Term Preterm Abortions TAB SAB Ect Mult Living   3 1 1  2  2   1          Patient Active Problem List   Diagnosis Date Noted  . Menorrhagia with irregular cycle 04/29/2014  . S/P gastric bypass 11/02/2013  . Obesity, Class III, BMI 40-49.9 (morbid obesity) 10/21/2013  . Hyperlipidemia 10/08/2013  . Primary localized osteoarthrosis, lower leg 07/06/2013  . Obesity, BMI unknown 04/23/2013  . Anxiety and depression, sees Dr. Toy Care whom prescripes her psych and sleep medications 04/09/2013  . Insomnia 04/09/2013  . Diabetes mellitus type 2, uncontrolled, without complications 54/62/7035  . IUD contraception - inserted summer 2013 04/09/2013    Past Medical History  Diagnosis Date  . Headache(784.0)     tension or sinus  . Arthritis     right knee  . Depression   . Anxiety   . Medial meniscus tear 12/2012    right knee  . History of MRSA infection prior to 2012    lasted 5-6 yr.  . Dental crowns present   . Jaw snapping     states jaw pops  . Insect bites 12/24/2012    leg  . Complication of anesthesia     has been hard to wake up post-op  . DVT of lower extremity (deep venous thrombosis) 2012    LLE  . Acute meniscal tear of knee 09/22/2012    Right knee.    . Chondromalacia 04/30/2013    Seen on MRI as well as during surgery by Dr.  Rhona Raider Tricompartmental degenerative changes   . Primary localized osteoarthrosis, lower leg 07/06/2013    Synvisc series started July 06, 2013   . Dysmenorrhea   . Endometriosis   . Fibroid   . Gestational diabetes 2004    wt loss surgery has resulted in normal A1C  . T2DM (type 2 diabetes mellitus) 05/2013    pt says DM resolved with wt loss    Past Surgical History  Procedure Laterality Date  . Hand surgery Left     X 3 - spider bite and MRSA infection  . Wrist surgery Right 2005  . Knee arthroscopy Right 12/29/2012    Procedure: RIGHT KNEE ARTHROSCOPY  PARTIAL MEDIAL AND LATERAL MENISECTOMY WITH CHONDROPLASTY;  Surgeon: Hessie Dibble, MD;  Location: [facility];  Service: Orthopedics;  Laterality: Right;  . Tonsillectomy  as child  . Cholecystectomy  1990's  . Gastric roux-en-y N/A 11/02/2013    Procedure: LAPAROSCOPIC ROUX-EN-Y GASTRIC BYPASS WITH UPPER ENDOSCOPY;  Surgeon: Gayland Curry, MD;  Location: [facility];  Service: General;  Laterality: N/A;  . Pelvic laparoscopy  1990's    d/t endometriosis    Current Outpatient Prescriptions  Medication Sig Dispense Refill  . acetaminophen (TYLENOL) 500  MG tablet Take 1,000 mg by mouth every 6 (six) hours as needed for mild pain.      Marland Kitchen alprazolam (XANAX) 2 MG tablet Take 1-2 mg by mouth 2 (two) times daily as needed for anxiety.       . Calcium Carbonate-Vitamin D (CALCIUM-D PO) Take 1 tablet by mouth 3 (three) times daily.      . Diclofenac Sodium (PENNSAID) 2 % SOLN Place 2 application onto the skin 2 (two) times daily.  112 g  3  . DULoxetine (CYMBALTA) 60 MG capsule Take 120 mg by mouth every morning.       Marland Kitchen levonorgestrel (MIRENA) 20 MCG/24HR IUD 1 each by Intrauterine route once.      . Multiple Vitamin (MULTIVITAMIN) tablet Take 1 tablet by mouth daily.      . ondansetron (ZOFRAN) 4 MG tablet TAKE 1 TABLET (4 MG TOTAL) BY MOUTH EVERY 8 (EIGHT) HOURS AS NEEDED FOR NAUSEA OR VOMITING.  9 tablet  0  .  zolpidem (AMBIEN) 10 MG tablet Take 10 mg by mouth at bedtime.        No current facility-administered medications for this visit.     ALLERGIES: Sulfa antibiotics and Bactrim  Family History  Problem Relation Age of Onset  . Diabetes Mother   . Kidney disease Mother   . Hypertension Mother   . Cancer Mother     breast cancer  . Breast cancer Mother 82    dx'd with breast ca x2  . Cancer Maternal Grandmother   . Diabetes Maternal Grandmother 44    dec  . Breast cancer Maternal Grandmother   . Diabetes Maternal Grandfather   . Stroke Maternal Grandfather     History   Social History  . Marital Status: Single    Spouse Name: N/A    Number of Children: N/A  . Years of Education: N/A   Occupational History  . Not on file.   Social History Main Topics  . Smoking status: Never Smoker   . Smokeless tobacco: Never Used  . Alcohol Use: 0.5 oz/week    1 drink(s) per week     Comment: 1-2 glasses of wine a few times per month  . Drug Use: No  . Sexual Activity: No     Comment: same sex partner--has Mirena IUD inserted 05/2011   Other Topics Concern  . 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  PHYSICAL EXAMINATION:    BP 100/70  Pulse 60  Ht 5' 7.5" (1.715 m)  Wt 197 lb 12.8 oz (89.721 kg)  BMI 30.50 kg/m2  LMP 05/15/2014     General appearance: alert, cooperative and appears stated age Lungs: clear to auscultation bilaterally Heart: regular rate and rhythm Abdomen: soft, non-tender; no masses,  no organomegaly No abnormal inguinal nodes palpated  Pelvic: External genitalia:  no lesions              Urethra:  normal appearing urethra with no masses, tenderness or lesions              Bartholins and Skenes:  normal                 Vagina: normal appearing vagina with normal color and discharge, no lesions              Cervix: normal appearance                   Bimanual Exam:  Uterus:  uterus is normal size, shape, consistency and nontender                                      Adnexa: normal adnexa in size, nontender and no masses                                      ASSESSMENT  Menorrhagia with irregular menses. Mirena IUD patient. History of DVT on combined OCPs.  PLAN  Proceed with removal of IUD, hysteroscopy, dilation and curettage, and Novasure endometrial ablation.  Risks, benefits, and alternatives discussed with the patient who wishes to proceed. Risks include but are not limited to bleeding, infection, damage to surrounding organs, uterine perforation requiring laparoscopy and further surgery such cautery to uterine serosa, inability to perform the endometrial ablation if perforation occurs, continued menstruation, reaction to anesthesia, pneumonia, DVT, PE, death.  PT/PTT/CBC/BMP. Will proceed with Lovenox 40 mg subQ 2 hours prior to surgery as DVT prophylaxis. Will also plan for mechanical DVT prophylaxis. Pain post op to be treated with Tylenol or Roxicet liquid (Rx given to patient today.) An After Visit Summary was printed and given to the patient.  __25____ minutes face to face time of which over 50% was spent in counseling.

## 2014-05-27 ENCOUNTER — Other Ambulatory Visit: Payer: Self-pay

## 2014-05-27 DIAGNOSIS — Z1231 Encounter for screening mammogram for malignant neoplasm of breast: Secondary | ICD-10-CM

## 2014-05-27 LAB — APTT: aPTT: 35 seconds (ref 24–37)

## 2014-05-29 MED ORDER — ENOXAPARIN SODIUM 40 MG/0.4ML ~~LOC~~ SOLN
40.0000 mg | SUBCUTANEOUS | Status: AC
  Administered 2014-05-30: 40 mg via SUBCUTANEOUS
  Filled 2014-05-29: qty 0.4

## 2014-05-29 NOTE — H&P (Signed)
Cheryl Maclin E Amundson de Berton Lan, MD at 05/26/2014 11:30 AM      Status: Signed            Patient ID: [name], female   DOB: [birth year], 43 y.o.   MRN: [id number] GYNECOLOGY  VISIT   HPI: 43 y.o.   Single  Caucasian  female    [phone] with Patient's last menstrual period was 05/15/2014.    here to be evaluated for surgery.   Having heavy and irregular menses despite Mirena.   Pelvic ultrasound - normal uterus.   EMB benign inactive endometrium.   History of DVT on combined oral contraceptives.   Declines future childbearing.   Same sex partner.  GYNECOLOGIC HISTORY: Patient's last menstrual period was 05/15/2014. Contraception: Mirena IUD -- same sex partner   Menopausal hormone therapy: n/a         OB History     Grav  Para  Term  Preterm  Abortions  TAB  SAB  Ect  Mult  Living     3  1  1    2    2      1              Patient Active Problem List     Diagnosis  Date Noted   .  Menorrhagia with irregular cycle  04/29/2014   .  S/P gastric bypass  11/02/2013   .  Obesity, Class III, BMI 40-49.9 (morbid obesity)  10/21/2013   .  Hyperlipidemia  10/08/2013   .  Primary localized osteoarthrosis, lower leg  07/06/2013   .  Obesity, BMI unknown  04/23/2013   .  Anxiety and depression, sees Dr. Toy Care whom prescripes her psych and sleep medications  04/09/2013   .  Insomnia  04/09/2013   .  Diabetes mellitus type 2, uncontrolled, without complications  48/25/0037   .  IUD contraception - inserted summer 2013  04/09/2013       Past Medical History   Diagnosis  Date   .  Headache(784.0)         tension or sinus   .  Arthritis         right knee   .  Depression     .  Anxiety     .  Medial meniscus tear  12/2012       right knee   .  History of MRSA infection  prior to 2012       lasted 5-6 yr.   .  Dental crowns present     .  Jaw snapping         states jaw pops   .  Insect bites  12/24/2012       leg   .  Complication of anesthesia         has been hard  to wake up post-op   .  DVT of lower extremity (deep venous thrombosis)  2012       LLE   .  Acute meniscal tear of knee  09/22/2012       Right knee.     .  Chondromalacia  04/30/2013       Seen on MRI as well as during surgery by Dr. Rhona Raider Tricompartmental degenerative changes    .  Primary localized osteoarthrosis, lower leg  07/06/2013       Synvisc series started July 06, 2013    .  Dysmenorrhea     .  Endometriosis     .  Fibroid     .  Gestational diabetes  2004       wt loss surgery has resulted in normal A1C   .  T2DM (type 2 diabetes mellitus)  05/2013       pt says DM resolved with wt loss       Past Surgical History   Procedure  Laterality  Date   .  Hand surgery  Left         X 3 - spider bite and MRSA infection   .  Wrist surgery  Right  2005   .  Knee arthroscopy  Right  12/29/2012       Procedure: RIGHT KNEE ARTHROSCOPY  PARTIAL MEDIAL AND LATERAL MENISECTOMY WITH CHONDROPLASTY;  Surgeon: Hessie Dibble, MD;  Location: [facility];  Service: Orthopedics;  Laterality: Right;   .  Tonsillectomy    as child   .  Cholecystectomy    1990's   .  Gastric roux-en-y  N/A  11/02/2013       Procedure: LAPAROSCOPIC ROUX-EN-Y GASTRIC BYPASS WITH UPPER ENDOSCOPY;  Surgeon: Gayland Curry, MD;  Location: [facility];  Service: General;  Laterality: N/A;   .  Pelvic laparoscopy    1990's       d/t endometriosis       Current Outpatient Prescriptions   Medication  Sig  Dispense  Refill   .  acetaminophen (TYLENOL) 500 MG tablet  Take 1,000 mg by mouth every 6 (six) hours as needed for mild pain.         Marland Kitchen  alprazolam (XANAX) 2 MG tablet  Take 1-2 mg by mouth 2 (two) times daily as needed for anxiety.          .  Calcium Carbonate-Vitamin D (CALCIUM-D PO)  Take 1 tablet by mouth 3 (three) times daily.         .  Diclofenac Sodium (PENNSAID) 2 % SOLN  Place 2 application onto the skin 2 (two) times daily.   112 g   3   .  DULoxetine (CYMBALTA) 60 MG capsule  Take 120  mg by mouth every morning.          Marland Kitchen  levonorgestrel (MIRENA) 20 MCG/24HR IUD  1 each by Intrauterine route once.         .  Multiple Vitamin (MULTIVITAMIN) tablet  Take 1 tablet by mouth daily.         .  ondansetron (ZOFRAN) 4 MG tablet  TAKE 1 TABLET (4 MG TOTAL) BY MOUTH EVERY 8 (EIGHT) HOURS AS NEEDED FOR NAUSEA OR VOMITING.   9 tablet   0   .  zolpidem (AMBIEN) 10 MG tablet  Take 10 mg by mouth at bedtime.             No current facility-administered medications for this visit.      ALLERGIES: Sulfa antibiotics and Bactrim    Family History   Problem  Relation  Age of Onset   .  Diabetes  Mother     .  Kidney disease  Mother     .  Hypertension  Mother     .  Cancer  Mother         breast cancer   .  Breast cancer  Mother  61       dx'd with breast ca x2   .  Cancer  Maternal Grandmother     .  Diabetes  Maternal Grandmother  65       dec   .  Breast cancer  Maternal Grandmother     .  Diabetes  Maternal Grandfather     .  Stroke  Maternal Grandfather         History      Social History   .  Marital Status:  Single       Spouse Name:  N/A       Number of Children:  N/A   .  Years of Education:  N/A      Occupational History   .  Not on file.      Social History Main Topics   .  Smoking status:  Never Smoker    .  Smokeless tobacco:  Never Used   .  Alcohol Use:  0.5 oz/week       1 drink(s) per week         Comment: 1-2 glasses of wine a few times per month   .  Drug Use:  No   .  Sexual Activity:  No         Comment: same sex partner--has Mirena IUD inserted 05/2011      Other Topics  Concern   .  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  PHYSICAL EXAMINATION:    BP 100/70   Pulse 60  Ht 5' 7.5" (1.715 m)  Wt 197 lb 12.8 oz (89.721 kg)  BMI 30.50 kg/m2  LMP 05/15/2014     General appearance: alert, cooperative and appears stated age Lungs: clear to auscultation bilaterally Heart: regular rate and rhythm Abdomen: soft, non-tender; no masses,  no organomegaly No abnormal inguinal nodes palpated  Pelvic: External genitalia:  no lesions              Urethra:  normal appearing urethra with no masses, tenderness or lesions              Bartholins and Skenes: normal                  Vagina: normal appearing vagina with normal color and discharge, no lesions              Cervix: normal appearance                    Bimanual Exam:  Uterus:  uterus is normal size, shape, consistency and nontender                                      Adnexa: normal adnexa in size, nontender and no masses                                       ASSESSMENT  Menorrhagia with irregular menses. Mirena IUD patient. History of DVT on combined OCPs.  PLAN  Proceed with removal of IUD, hysteroscopy, dilation and curettage, and Novasure endometrial ablation.  Risks, benefits, and alternatives discussed with the patient who wishes to proceed. Risks include but are not limited to bleeding, infection, damage to surrounding organs, uterine perforation requiring laparoscopy and further surgery such cautery to uterine serosa, inability to perform the endometrial ablation if perforation occurs, continued menstruation, reaction to anesthesia, pneumonia, DVT, PE, death.   PT/PTT/CBC/BMP. Will proceed with Lovenox 40 mg subQ 2 hours prior to surgery as DVT prophylaxis. Will also plan for mechanical DVT prophylaxis. Pain post op to be treated with Tylenol or Roxicet liquid (Rx given to patient today.) An After Visit Summary was printed and given to the patient.  __25____ minutes face to face time of which over 50% was spent in counseling.

## 2014-05-30 ENCOUNTER — Ambulatory Visit (HOSPITAL_COMMUNITY)
Admission: RE | Admit: 2014-05-30 | Discharge: 2014-05-30 | Disposition: A | Discharge status: Home / Self Care | Payer: BC Managed Care – PPO | Source: Ambulatory Visit | Attending: Obstetrics and Gynecology | Admitting: Obstetrics and Gynecology

## 2014-05-30 ENCOUNTER — Encounter (HOSPITAL_COMMUNITY): Payer: BC Managed Care – PPO | Admitting: Certified Registered Nurse Anesthetist

## 2014-05-30 ENCOUNTER — Encounter (HOSPITAL_COMMUNITY)
Admission: RE | Disposition: A | Discharge status: Home / Self Care | Payer: Self-pay | Source: Ambulatory Visit | Attending: Obstetrics and Gynecology

## 2014-05-30 ENCOUNTER — Other Ambulatory Visit: Payer: Self-pay | Admitting: Obstetrics and Gynecology

## 2014-05-30 ENCOUNTER — Encounter (HOSPITAL_COMMUNITY): Payer: Self-pay | Admitting: *Deleted

## 2014-05-30 ENCOUNTER — Ambulatory Visit (HOSPITAL_COMMUNITY): Payer: BC Managed Care – PPO | Admitting: Certified Registered Nurse Anesthetist

## 2014-05-30 DIAGNOSIS — Z86718 Personal history of other venous thrombosis and embolism: Secondary | ICD-10-CM | POA: Diagnosis not present

## 2014-05-30 DIAGNOSIS — Z6830 Body mass index (BMI) 30.0-30.9, adult: Secondary | ICD-10-CM | POA: Insufficient documentation

## 2014-05-30 DIAGNOSIS — N921 Excessive and frequent menstruation with irregular cycle: Secondary | ICD-10-CM | POA: Insufficient documentation

## 2014-05-30 DIAGNOSIS — Z6841 Body Mass Index (BMI) 40.0 and over, adult: Secondary | ICD-10-CM | POA: Diagnosis not present

## 2014-05-30 DIAGNOSIS — Z30432 Encounter for removal of intrauterine contraceptive device: Secondary | ICD-10-CM | POA: Diagnosis not present

## 2014-05-30 DIAGNOSIS — N920 Excessive and frequent menstruation with regular cycle: Secondary | ICD-10-CM | POA: Diagnosis not present

## 2014-05-30 DIAGNOSIS — Z9884 Bariatric surgery status: Secondary | ICD-10-CM | POA: Insufficient documentation

## 2014-05-30 DIAGNOSIS — G4700 Insomnia, unspecified: Secondary | ICD-10-CM | POA: Insufficient documentation

## 2014-05-30 DIAGNOSIS — N926 Irregular menstruation, unspecified: Secondary | ICD-10-CM | POA: Diagnosis not present

## 2014-05-30 DIAGNOSIS — F329 Major depressive disorder, single episode, unspecified: Secondary | ICD-10-CM | POA: Insufficient documentation

## 2014-05-30 DIAGNOSIS — F419 Anxiety disorder, unspecified: Secondary | ICD-10-CM | POA: Insufficient documentation

## 2014-05-30 HISTORY — PX: DILITATION & CURRETTAGE/HYSTROSCOPY WITH NOVASURE ABLATION: SHX5568

## 2014-05-30 HISTORY — PX: HYSTEROSCOPY WITH RESECTOSCOPE: SHX5395

## 2014-05-30 LAB — CBC
HCT: 39.4 % (ref 36.0–46.0)
HEMOGLOBIN: 13.1 g/dL (ref 12.0–15.0)
MCH: 30.5 pg (ref 26.0–34.0)
MCHC: 33.2 g/dL (ref 30.0–36.0)
MCV: 91.8 fL (ref 78.0–100.0)
Platelets: 344 10*3/uL (ref 150–400)
RBC: 4.29 MIL/uL (ref 3.87–5.11)
RDW: 13.7 % (ref 11.5–15.5)
WBC: 8.8 10*3/uL (ref 4.0–10.5)

## 2014-05-30 SURGERY — DILATATION & CURETTAGE/HYSTEROSCOPY WITH NOVASURE ABLATION
Anesthesia: General | Site: Uterus

## 2014-05-30 MED ORDER — ONDANSETRON HCL 4 MG/2ML IJ SOLN
INTRAMUSCULAR | Status: DC | PRN
  Administered 2014-05-30: 4 mg via INTRAVENOUS

## 2014-05-30 MED ORDER — LIDOCAINE HCL (CARDIAC) 20 MG/ML IV SOLN
INTRAVENOUS | Status: DC | PRN
  Administered 2014-05-30: 100 mg via INTRAVENOUS

## 2014-05-30 MED ORDER — KETOROLAC TROMETHAMINE 30 MG/ML IJ SOLN: 15.0000 mg | Freq: Once | INTRAMUSCULAR | Status: DC | PRN

## 2014-05-30 MED ORDER — ONDANSETRON HCL 4 MG/2ML IJ SOLN
4.0000 mg | Freq: Once | INTRAMUSCULAR | Status: DC | PRN
Start: 2014-05-30 — End: 2014-05-30

## 2014-05-30 MED ORDER — FENTANYL CITRATE 0.05 MG/ML IJ SOLN
INTRAMUSCULAR | Status: AC
  Filled 2014-05-30: qty 2

## 2014-05-30 MED ORDER — HYDROCODONE-ACETAMINOPHEN 7.5-325 MG/15ML PO SOLN: 10.0000 mL | ORAL | Status: DC | PRN

## 2014-05-30 MED ORDER — FENTANYL CITRATE 0.05 MG/ML IJ SOLN
INTRAMUSCULAR | Status: DC | PRN
  Administered 2014-05-30: 12.5 ug via INTRAVENOUS
  Administered 2014-05-30: 25 ug via INTRAVENOUS
  Administered 2014-05-30 (×3): 50 ug via INTRAVENOUS
  Administered 2014-05-30: 12.5 ug via INTRAVENOUS

## 2014-05-30 MED ORDER — GLYCINE 1.5 % IR SOLN
Status: DC | PRN
  Administered 2014-05-30: 3000 mL

## 2014-05-30 MED ORDER — SCOPOLAMINE 1 MG/3DAYS TD PT72
1.0000 | MEDICATED_PATCH | Freq: Once | TRANSDERMAL | Status: AC
  Administered 2014-05-30: 1.5 mg via TRANSDERMAL
  Administered 2014-05-30: 1 via TRANSDERMAL

## 2014-05-30 MED ORDER — SCOPOLAMINE 1 MG/3DAYS TD PT72
MEDICATED_PATCH | TRANSDERMAL | Status: AC
  Administered 2014-05-30: 1.5 mg via TRANSDERMAL
  Filled 2014-05-30: qty 1

## 2014-05-30 MED ORDER — DEXAMETHASONE SODIUM PHOSPHATE 4 MG/ML IJ SOLN
INTRAMUSCULAR | Status: AC
  Filled 2014-05-30: qty 1

## 2014-05-30 MED ORDER — FENTANYL CITRATE 0.05 MG/ML IJ SOLN
25.0000 ug | INTRAMUSCULAR | Status: DC | PRN
  Administered 2014-05-30 (×3): 50 ug via INTRAVENOUS

## 2014-05-30 MED ORDER — PROPOFOL 10 MG/ML IV BOLUS
INTRAVENOUS | Status: DC | PRN
  Administered 2014-05-30: 150 mg via INTRAVENOUS

## 2014-05-30 MED ORDER — MEPERIDINE HCL 25 MG/ML IJ SOLN: 6.2500 mg | INTRAMUSCULAR | Status: DC | PRN

## 2014-05-30 MED ORDER — KETOROLAC TROMETHAMINE 30 MG/ML IJ SOLN
INTRAMUSCULAR | Status: AC
  Filled 2014-05-30: qty 1

## 2014-05-30 MED ORDER — LACTATED RINGERS IR SOLN
Status: DC | PRN
  Administered 2014-05-30: 3000 mL

## 2014-05-30 MED ORDER — ONDANSETRON HCL 4 MG/2ML IJ SOLN
INTRAMUSCULAR | Status: AC
  Filled 2014-05-30: qty 2

## 2014-05-30 MED ORDER — LIDOCAINE HCL 1 % IJ SOLN
INTRAMUSCULAR | Status: DC | PRN
  Administered 2014-05-30: 10 mL

## 2014-05-30 MED ORDER — FENTANYL CITRATE 0.05 MG/ML IJ SOLN
INTRAMUSCULAR | Status: AC
  Administered 2014-05-30: 50 ug via INTRAVENOUS
  Filled 2014-05-30: qty 2

## 2014-05-30 MED ORDER — GLYCOPYRROLATE 0.2 MG/ML IJ SOLN
INTRAMUSCULAR | Status: AC
  Filled 2014-05-30: qty 1

## 2014-05-30 MED ORDER — LACTATED RINGERS IV SOLN
INTRAVENOUS | Status: DC
Start: 2014-05-30 — End: 2014-05-30

## 2014-05-30 MED ORDER — CEFAZOLIN SODIUM-DEXTROSE 2-3 GM-% IV SOLR
2.0000 g | INTRAVENOUS | Status: AC
Start: 2014-05-30 — End: 2014-05-30
  Administered 2014-05-30: 2 g via INTRAVENOUS

## 2014-05-30 MED ORDER — GLYCOPYRROLATE 0.2 MG/ML IJ SOLN
INTRAMUSCULAR | Status: DC | PRN
  Administered 2014-05-30: 0.2 mg via INTRAVENOUS

## 2014-05-30 MED ORDER — LIDOCAINE HCL 1 % IJ SOLN
INTRAMUSCULAR | Status: AC
  Filled 2014-05-30: qty 20

## 2014-05-30 MED ORDER — CEFAZOLIN SODIUM-DEXTROSE 2-3 GM-% IV SOLR
INTRAVENOUS | Status: AC
  Filled 2014-05-30: qty 50

## 2014-05-30 MED ORDER — DEXAMETHASONE SODIUM PHOSPHATE 10 MG/ML IJ SOLN
INTRAMUSCULAR | Status: DC | PRN
  Administered 2014-05-30: 4 mg via INTRAVENOUS

## 2014-05-30 MED ORDER — LIDOCAINE HCL (CARDIAC) 20 MG/ML IV SOLN
INTRAVENOUS | Status: AC
  Filled 2014-05-30: qty 5

## 2014-05-30 MED ORDER — LACTATED RINGERS IV SOLN
INTRAVENOUS | Status: DC
  Administered 2014-05-30 (×2): via INTRAVENOUS

## 2014-05-30 MED ORDER — MIDAZOLAM HCL 2 MG/2ML IJ SOLN
INTRAMUSCULAR | Status: DC | PRN
  Administered 2014-05-30: 2 mg via INTRAVENOUS

## 2014-05-30 MED ORDER — MIDAZOLAM HCL 2 MG/2ML IJ SOLN
INTRAMUSCULAR | Status: AC
  Filled 2014-05-30: qty 2

## 2014-05-30 SURGICAL SUPPLY — 23 items
ABLATOR ENDOMETRIAL BIPOLAR (ABLATOR) ×4 IMPLANT
CANISTER SUCT 3000ML (MISCELLANEOUS) ×8 IMPLANT
CATH ROBINSON RED A/P 16FR (CATHETERS) ×4 IMPLANT
CLOTH BEACON ORANGE TIMEOUT ST (SAFETY) ×4 IMPLANT
CONTAINER PREFILL 10% NBF 60ML (FORM) ×4 IMPLANT
DRAPE HYSTEROSCOPY (DRAPE) ×4 IMPLANT
DRSG TELFA 3X8 NADH (GAUZE/BANDAGES/DRESSINGS) ×4 IMPLANT
ELECT REM PT RETURN 9FT ADLT (ELECTROSURGICAL) ×4
ELECTRODE REM PT RTRN 9FT ADLT (ELECTROSURGICAL) ×2 IMPLANT
GLOVE BIO SURGEON STRL SZ 6.5 (GLOVE) ×6 IMPLANT
GLOVE BIO SURGEONS STRL SZ 6.5 (GLOVE) ×2
GLOVE BIOGEL PI IND STRL 7.0 (GLOVE) ×4 IMPLANT
GLOVE BIOGEL PI INDICATOR 7.0 (GLOVE) ×4
GLOVE SURG SS PI 7.0 STRL IVOR (GLOVE) ×36 IMPLANT
GOWN STRL REUS W/TWL LRG LVL3 (GOWN DISPOSABLE) ×8 IMPLANT
LOOP ANGLED CUTTING 22FR (CUTTING LOOP) ×4 IMPLANT
NEEDLE SPNL 20GX3.5 QUINCKE YW (NEEDLE) IMPLANT
PACK VAGINAL MINOR WOMEN LF (CUSTOM PROCEDURE TRAY) ×4 IMPLANT
PAD OB MATERNITY 4.3X12.25 (PERSONAL CARE ITEMS) ×4 IMPLANT
SET TUBING HYSTEROSCOPY 2 NDL (TUBING) ×4 IMPLANT
TOWEL OR 17X24 6PK STRL BLUE (TOWEL DISPOSABLE) ×8 IMPLANT
TUBE HYSTEROSCOPY W Y-CONNECT (TUBING) ×4 IMPLANT
WATER STERILE IRR 1000ML POUR (IV SOLUTION) ×4 IMPLANT

## 2014-05-30 NOTE — Anesthesia Postprocedure Evaluation (Signed)
Anesthesia Post Note  Patient: [name]  Procedure(s) Performed: Procedure(s) (LRB): DILATATION & CURETTAGE/HYSTEROSCOPY WITH attempted NOVASURE ABLATION and with IUD removal  (N/A) HYSTEROSCOPY WITH RESECTOSCOPE (N/A)  Anesthesia type: General  Patient location: PACU  Post pain: Pain level controlled  Post assessment: Post-op Vital signs reviewed  Last Vitals:  Filed Vitals:   05/30/14 0915  BP: 118/71  Pulse: 76  Temp:   Resp: 15    Post vital signs: Reviewed  Level of consciousness: sedated  Complications: No apparent anesthesia complications

## 2014-05-30 NOTE — OR Nursing (Signed)
Uterine Cavity flushed with Glycine for three minutes after attempted Novasure ablation.

## 2014-05-30 NOTE — Brief Op Note (Signed)
05/30/2014  8:31 AM  PATIENT:  [name]  43 y.o. female  PRE-OPERATIVE DIAGNOSIS:  menorrhagia  POST-OPERATIVE DIAGNOSIS:  menorrhagia  PROCEDURE:  Procedure(s): DILATATION & CURETTAGE/HYSTEROSCOPY WITH attempted NOVASURE ABLATION and with IUD removal  (N/A) HYSTEROSCOPY WITH RESECTOSCOPE (N/A) OF ENDOMETRIUM  SURGEON:  Surgeon(s) and Role:    * Baraka Klatt E Amundson de Berton Lan, MD - Primary  PHYSICIAN ASSISTANT:   ASSISTANTS: none   ANESTHESIA:   paracervical block and  LMA  EBL:  Total I/O In: 1000 [I.V.:1000] Out: 50 [Urine:25; Blood:25] Lactated Ringers Deficit:   25 cc Glycine Deficit:  300 cc. BLOOD ADMINISTERED:none  DRAINS: none   LOCAL MEDICATIONS USED:  LIDOCAINE  and Amount:  10 ml  SPECIMEN:  Source of Specimen:   endometrial curettings and endometrial resection  DISPOSITION OF SPECIMEN:  PATHOLOGY  COUNTS:  YES  TOURNIQUET:  * No tourniquets in log *  DICTATION: .Other Dictation: Dictation Number    PLAN OF CARE: Discharge to home after PACU  PATIENT DISPOSITION:  PACU - hemodynamically stable.   Delay start of Pharmacological VTE agent (>24hrs) due to surgical blood loss or risk of bleeding:  Received Lovenox 40 mg sub Q prior to surgery.

## 2014-05-30 NOTE — Op Note (Signed)
[name], [name] NO.:  [id number]  MEDICAL RECORD NO.:  [id number]  LOCATION:  [facility]                          FACILITY:  [facility]  PHYSICIAN:  Lenard Galloway, M.D.   DATE OF BIRTH:  15-Dec-1970  DATE OF PROCEDURE:  05/30/2014 DATE OF DISCHARGE:                              OPERATIVE REPORT   PREOPERATIVE DIAGNOSIS:  Menometrorrhagia, Mirena intrauterine device patient.  POSTOPERATIVE DIAGNOSIS:  Menometrorrhagia, Mirena intrauterine device patient.  PROCEDURE:  Mirena intrauterine device removal, hysteroscopy with dilation and curettage, attempted NovaSure endometrial ablation, endometrial resection with the resectoscope.  SURGEON:  Lenard Galloway, M.D.  ANESTHESIA:  LMA, paracervical block with 10 mL of 1% lidocaine.  IV FLUIDS:  1000 mL Ringer's lactate.  ESTIMATED BLOOD LOSS:  25 mL.  URINE OUTPUT:  25 mL.  LACTATED RINGER'S DEFICIT:  25 mL.  GLYCINE DEFICIT:  300 mL.  COMPLICATIONS:  None.  INDICATIONS FOR THE PROCEDURE:  The patient is a 43 year old para 56 Caucasian female, currently using a Mirena IUD, who presents with heavy and irregular menstrual bleeding.  Pelvic ultrasound documented a normal uterus and an endometrial biopsy showed benign inactive endometrium. The patient has a history of deep venous thrombosis while on combined oral contraceptives, and she is not an estrogen candidate.  The patient declines future childbearing.  Exam under anesthesia revealed a normal anteverted mobile uterus.  No adnexal masses were appreciated.  The uterus sounded to almost 9 cm.  Hysteroscopy demonstrated a normal endometrial cavity with no evidence of polyps or fibroids.  Both tubal ostial regions were visualized.  There was no evidence of a cervical polyp.  SPECIMEN:  Endometrial curettings were sent to Pathology along with small pieces of resected endometrium.  DESCRIPTION OF PROCEDURE:  The patient was reidentified in the preoperative hold  area.  She did receive Lovenox 40 mg subcutaneously. She received TED hose and PAS stockings for DVT prophylaxis.  She also received Ancef 2 g IV for antibiotic prophylaxis.  In the operating room, the patient was placed in the supine position on the operating room table.  The LMA anesthetic was administered.  The lower abdomen, vagina, and perineum were sterilely prepped, and she was then draped.  The bladder had been previously drained with a red rubber catheter.  An exam under anesthesia was performed.  The strings of the IUD were grasped and the IUD was removed intact and discarded.  A speculum was placed inside the vagina and a single-tooth tenaculum was placed on the anterior cervical lip.  A paracervical block was performed with a total of 10 mL of 1% lidocaine.  The uterus was sounded to 9 cm. A cervical length was determined to be 3 cm.  The cervix was dilated to a #19 Pratt dilator.  The diagnostic hysteroscope was inserted into the uterine cavity under continuous infusion of lactated Ringer's.  The findings are as noted above.  The hysteroscope was removed.  The cervix was further dilated to a #25 Pratt dilator and then a sharp and a serrated curette were used to curette the endometrium in all 4 quadrants.  The tissue was sent to Pathology.  The NovaSure device was then  inserted into the uterine cavity to the level of the uterine fundus.  The NovaSure device was opened.  The maximum width of the endometrial cavity was noted to be 2.5 cm.  The NovaSure was closed and removed from the uterine cavity and was examined and opened and closed again to make sure that it was functioning properly and it was.  The device was again inserted into the uterine cavity and the distance was checked for the uterine width.  This again was 2.5 cm.  It was checked an additional time as it was thought that this was unusual due to the patient's parous status, however, the NovaSure was abandoned  at this time.  At this point, the resectoscope was used and was placed inside the uterine cavity.  The uterine cavity was flushed well at this time for 2 minutes with the glycine.  Resection using coagulation and 40 watts of energy was performed and the endometrium was completely resected throughout 360 degrees.  Small bits of resected tissue were removed and were sent to Pathology with the endometrial curettings.  The instruments were removed from within the uterine cavity and from the cervix.  There was a small amount of bleeding noted at the paracervical injection sites and the tenaculum site anteriorly and this responded to compression.  Hemostasis was then excellent.  The remaining vaginal instruments were removed.  The patient was cleansed of Betadine.  She was awakened and escorted to the recovery room in stable condition.  There were no complications to the procedure.  All needle, instrument, and sponge counts were correct.     Lenard Galloway, M.D.     BES/MEDQ  D:  05/30/2014  T:  05/30/2014  Job:  [id number]

## 2014-05-30 NOTE — Discharge Instructions (Signed)
DISCHARGE INSTRUCTIONS: HYSTEROSCOPY / ENDOMETRIAL ABLATION  The following instructions have been prepared to help you care for yourself upon your return home.  Personal hygiene:  Use sanitary pads for vaginal drainage, not tampons.  Shower the day after your procedure.  NO tub baths, pools or Jacuzzis for 2-3 weeks.  Wipe front to back after using the bathroom.  Activity and limitations:  Do NOT drive or operate any equipment for 24 hours. The effects of anesthesia are still present and drowsiness may result.  Do NOT rest in bed all day.  Walking is encouraged.  Walk up and down stairs slowly.  You may resume your normal activity in one to two days or as indicated by your physician. Sexual activity: NO intercourse for at least 2 weeks after the procedure, or as indicated by your Doctor.  Diet: Eat a light meal as desired this evening. You may resume your usual diet tomorrow.  Return to Work: You may resume your work activities in one to two days or as indicated by Marine scientist.  What to expect after your surgery: Expect to have vaginal bleeding/discharge for 2-3 days and spotting for up to 10 days. It is not unusual to have soreness for up to 1-2 weeks. You may have a slight burning sensation when you urinate for the first day. Mild cramps may continue for a couple of days. You may have a regular period in 2-6 weeks.  Call your doctor for any of the following:  Excessive vaginal bleeding or clotting, saturating and changing one pad every hour.  Inability to urinate 6 hours after discharge from hospital.  Pain not relieved by pain medication.  Fever of 100.4 F or greater.  Unusual vaginal discharge or odor.  Return to office on 06/24/2014 at 3:30  Patients signature: ______________________  Nurses signature ________________________  Support person's signature__________________________

## 2014-05-30 NOTE — Anesthesia Preprocedure Evaluation (Addendum)
Anesthesia Evaluation  Patient identified by MRN, date of birth, ID band Patient awake    Reviewed: Allergy & Precautions, H&P , NPO status , Patient's Chart, lab work & pertinent test results, reviewed documented beta blocker date and time   Airway Mallampati: I TM Distance: >3 FB Neck ROM: full    Dental no notable dental hx. (+) Teeth Intact   Pulmonary neg pulmonary ROS,    Pulmonary exam normal       Cardiovascular negative cardio ROS      Neuro/Psych negative neurological ROS     GI/Hepatic negative GI ROS, Neg liver ROS,   Endo/Other  diabetes  Renal/GU negative Renal ROS     Musculoskeletal   Abdominal Normal abdominal exam  (+)   Peds  Hematology negative hematology ROS (+)   Anesthesia Other Findings   Reproductive/Obstetrics negative OB ROS                          Anesthesia Physical Anesthesia Plan  ASA: II  Anesthesia Plan: General   Post-op Pain Management:    Induction: Intravenous  Airway Management Planned: LMA  Additional Equipment:   Intra-op Plan:   Post-operative Plan:   Informed Consent: I have reviewed the patients History and Physical, chart, labs and discussed the procedure including the risks, benefits and alternatives for the proposed anesthesia with the patient or authorized representative who has indicated his/her understanding and acceptance.     Plan Discussed with: CRNA and Surgeon  Anesthesia Plan Comments:         Anesthesia Quick Evaluation

## 2014-05-30 NOTE — Progress Notes (Signed)
Update to history and physical.   No marked change in status since office preop visit.  Patient examined.  Received Lovenox.  OK to proceed.

## 2014-05-30 NOTE — Transfer of Care (Signed)
Immediate Anesthesia Transfer of Care Note  Patient: [name]  Procedure(s) Performed: Procedure(s): DILATATION & CURETTAGE/HYSTEROSCOPY WITH attempted [place] and with IUD removal  (N/A) HYSTEROSCOPY WITH RESECTOSCOPE (N/A)  Patient Location: PACU  Anesthesia Type:General  Level of Consciousness: awake, alert  and oriented  Airway & Oxygen Therapy: Patient Spontanous Breathing and Patient connected to nasal cannula oxygen  Post-op Assessment: Report given to PACU RN, Post -op Vital signs reviewed and stable and Patient moving all extremities X 4  Post vital signs: Reviewed and stable  Complications: No apparent anesthesia complications

## 2014-05-31 ENCOUNTER — Encounter (HOSPITAL_COMMUNITY): Payer: Self-pay | Admitting: Obstetrics and Gynecology

## 2014-06-02 ENCOUNTER — Ambulatory Visit (INDEPENDENT_AMBULATORY_CARE_PROVIDER_SITE_OTHER): Payer: BC Managed Care – PPO | Admitting: General Surgery

## 2014-06-02 ENCOUNTER — Telehealth: Payer: Self-pay

## 2014-06-02 NOTE — Telephone Encounter (Signed)
Returning a call to [name]. °

## 2014-06-02 NOTE — Telephone Encounter (Signed)
Spoke with patient. Advised of results as seen below from [place]. Patient is agreeable and verbalizes understanding. Patient denies fevers or increased pain. "I am just a little sore. It is nothing unbearable." Advised patient if anything changes to give [place] a call. We are glad she is doing well and will see her in 4 weeks for her post op. Patient is agreeable.  Routing to provider for final review. Patient agreeable to disposition. Will close encounter

## 2014-06-02 NOTE — Telephone Encounter (Signed)
Left message to call Twanda Stakes at [phone]. 

## 2014-06-02 NOTE — Telephone Encounter (Signed)
Message copied by Jasmine Awe on Thu Jun 02, 2014 10:47 AM ------      Message from: [place], BROOK E      Created: Wed Jun 01, 2014  9:57 PM       Please contact patient with her pathology results from surgery - D&C, IUD removal, endometrial ablation.      Pathology was benign and showed marked inflammation of her cervical canal.      Is patient having any fevers or increased pain?      If she is doing well overall, I will do her post op visit in 4 weeks as planned. ------

## 2014-06-13 ENCOUNTER — Encounter: Payer: Self-pay | Admitting: Family Medicine

## 2014-06-13 ENCOUNTER — Encounter (HOSPITAL_COMMUNITY): Payer: Self-pay | Admitting: Obstetrics and Gynecology

## 2014-06-17 ENCOUNTER — Ambulatory Visit: Payer: BC Managed Care – PPO | Admitting: Obstetrics and Gynecology

## 2014-06-17 NOTE — Telephone Encounter (Signed)
See phone note for 05-23-14.  Routing to provider for final review. Patient agreeable to disposition. Will close encounter

## 2014-06-21 ENCOUNTER — Encounter: Payer: BC Managed Care – PPO | Attending: General Surgery | Admitting: Dietician

## 2014-06-21 VITALS — Ht 68.0 in | Wt 188.0 lb

## 2014-06-21 DIAGNOSIS — Z713 Dietary counseling and surveillance: Secondary | ICD-10-CM | POA: Diagnosis not present

## 2014-06-21 DIAGNOSIS — Z6828 Body mass index (BMI) 28.0-28.9, adult: Secondary | ICD-10-CM | POA: Diagnosis not present

## 2014-06-21 NOTE — Progress Notes (Signed)
  Follow-up visit:  8 Months Post-Operative RYGB Surgery  Medical Nutrition Therapy:  Appt start time: 1200 end time:  1230.  Primary concerns today: Post-operative Bariatric Surgery Nutrition Management. Returns with a 36 lbs weight loss. Still not able to tolerate chicken. Able to eat a little bit of pork and red meat. Lunch meat and fish is ok. Overall struggling to get in protein that is not yogurt, cheese, or a protein shake. Trying to avoid artificial sweetner. Struggling to get in fluid d/t colder weather. Has some nausea with meat but has not vomited in a while.   Surgery date: 11/02/2013  Surgery type: RYGB  [facility] weight at [facility]: 291 on 05/24/13  Weight today: 188.0 lbs   Weight change: 36 lbs Total weight loss: 66 lbs  Weight loss goal: 185 lbs  [place] BODY COMP RESULTS   10/28/2013  11/16/13  01/17/14 02/23/14 06/21/14  BMI (kg/m^2)  42.4  40.4  35.7 34.1 28.6  Fat Mass (lbs)  151  140.5  106.5 96.0 68.5  Fat Free Mass (lbs)  128  125.0  128.0 128.0 119.5  Total Body Water (lbs)  93.5  91.5  93.5 93.5 87.5    Preferred Learning Style:   No preference indicated   Learning Readiness:   Ready  24-hr recall: B (AM): [place] yogurt (10 g) Snk ( AM): nuts (6 g) L (PM): spinach with boiled egg and vegetables (~8 g) Snk ( PM): [facility] Protein bar or  piece of cheese or nothing (0-10 g) D (PM): cooked vegetables and 3 oz protein (21 g)  Snk (PM): sugar free popsicle or coffee with SF creamer   Fluid intake: 32 oz decaf coffee, 8 oz water ~ 40 oz  Estimated total protein intake: 45-55 g  Medications: see list Supplementation: taking  Using straws: Yes Drinking while eating: No Hair loss: Yes (not stopping, starting taking Biotin) Carbonated beverages: No N/V/D/C: some nausea but no vomiting lately, not as much constipation (is better) Dumping syndrome: No  Recent physical activity: 90 minutes biking 3-4 x week, looking for a winter activity   Progress Towards  Goal(s):  In progress   Nutritional Diagnosis:  [place]-3.3 Overweight/obesity related to past poor dietary habits and physical inactivity as evidenced by patient w/ recent RYGB surgery following dietary guidelines for continued weight loss.    Intervention:  Nutrition education/diet reinforcement.  Goals:  Follow Phase 3B: High Protein + Non-Starchy Vegetables  Eat 3-6 small meals/snacks, every 3-5 hrs  Increase lean protein foods to meet 60g goal  Increase fluid intake to 64oz +  Avoid drinking 15 minutes before, during and 30 minutes after eating  Aim for >30 min of physical activity daily  Try to add a protein shake to coffee to help meet 60 g protein goal  Add peanut butter (or any nut butter) for snacks  Eat protein foods first, then add carbs if you are still hungry  Look to add about 15 g of carbohydrates to meals as you like  Handouts given out during appointment:   Protein Shakes  Phase IV, Low-Fat, Low-Carb Solid Food  Teaching Method Utilized:  Visual Auditory Hands on  Barriers to learning/adherence to lifestyle change: hx of nausea, difficulty tolerating protein foods  Demonstrated degree of understanding via:  Teach Back   Monitoring/Evaluation:  Dietary intake, exercise, and body weight. Follow up in 3 months for 13 month post-op visit.

## 2014-06-21 NOTE — Patient Instructions (Signed)
Goals:  Follow Phase 3B: High Protein + Non-Starchy Vegetables  Eat 3-6 small meals/snacks, every 3-5 hrs  Increase lean protein foods to meet 60g goal  Increase fluid intake to 64oz +  Avoid drinking 15 minutes before, during and 30 minutes after eating  Aim for >30 min of physical activity daily  Try to add a protein shake to coffee to help meet 60 g protein goal  Add peanut butter (or any nut butter) for snacks  Eat protein foods first, then add carbs if you are still hungry  Look to add about 15 g of carbohydrates to meals as you like

## 2014-06-24 ENCOUNTER — Ambulatory Visit (INDEPENDENT_AMBULATORY_CARE_PROVIDER_SITE_OTHER): Payer: BC Managed Care – PPO | Admitting: Obstetrics and Gynecology

## 2014-06-24 ENCOUNTER — Encounter: Payer: Self-pay | Admitting: Obstetrics and Gynecology

## 2014-06-24 VITALS — BP 100/62 | HR 68 | Resp 16 | Ht 68.0 in | Wt 189.0 lb

## 2014-06-24 DIAGNOSIS — Z9889 Other specified postprocedural states: Secondary | ICD-10-CM

## 2014-06-24 NOTE — Patient Instructions (Signed)
You may resume all normal activities!  Call for any recurrent bleeding problems.

## 2014-06-24 NOTE — Progress Notes (Signed)
GYNECOLOGY  VISIT   HPI: 43 y.o.   Single  Caucasian  female   [id number] with No LMP recorded. Patient has had an ablation.   here for   Follow up - Post Op Status post IUD, hysteroscopy, attempted Novasure, resectosopic ablation on 05/30/14.   Pathology benign psuedodecidualized stroma and negative for hyperplasia and malignancy.  Inflammation of endocervix also noted.   Had bleeding for almost three weeks - light.  Cramping for one day only.   Patient's partner has HPV 18.  Patient had a normal pap and negative HR HPV in September 2015.   GYNECOLOGIC HISTORY: No LMP recorded. Patient has had an ablation. Contraception:  None Menopausal hormone therapy: N/A        OB History    Gravida Para Term Preterm AB TAB SAB Ectopic Multiple Living   3 1 1  2  2   1          Patient Active Problem List   Diagnosis Date Noted  . Menorrhagia with irregular cycle 04/29/2014  . S/P gastric bypass 11/02/2013  . Obesity, Class III, BMI 40-49.9 (morbid obesity) 10/21/2013  . Hyperlipidemia 10/08/2013  . Primary localized osteoarthrosis, lower leg 07/06/2013  . Obesity, BMI unknown 04/23/2013  . Anxiety and depression, sees Dr. Toy Care whom prescripes her psych and sleep medications 04/09/2013  . Insomnia 04/09/2013  . Diabetes mellitus type 2, uncontrolled, without complications 15/17/6160  . IUD contraception - inserted summer 2013 04/09/2013    Past Medical History  Diagnosis Date  . Headache(784.0)     tension or sinus  . Arthritis     right knee  . Depression   . Anxiety   . Medial meniscus tear 12/2012    right knee  . History of MRSA infection prior to 2012    lasted 5-6 yr.  . Dental crowns present   . Jaw snapping     states jaw pops  . Insect bites 12/24/2012    leg  . Complication of anesthesia     has been hard to wake up post-op  . DVT of lower extremity (deep venous thrombosis) 2012    LLE  . Acute meniscal tear of knee 09/22/2012    Right knee.    . Chondromalacia  04/30/2013    Seen on MRI as well as during surgery by Dr. Rhona Raider Tricompartmental degenerative changes   . Primary localized osteoarthrosis, lower leg 07/06/2013    Synvisc series started July 06, 2013   . Dysmenorrhea   . Endometriosis   . Fibroid   . Gestational diabetes 2004    wt loss surgery has resulted in normal A1C  . T2DM (type 2 diabetes mellitus) 05/2013    pt says DM resolved with wt loss    Past Surgical History  Procedure Laterality Date  . Hand surgery Left     X 3 - spider bite and MRSA infection  . Wrist surgery Right 2005  . Knee arthroscopy Right 12/29/2012    Procedure: RIGHT KNEE ARTHROSCOPY  PARTIAL MEDIAL AND LATERAL MENISECTOMY WITH CHONDROPLASTY;  Surgeon: Hessie Dibble, MD;  Location: [facility];  Service: Orthopedics;  Laterality: Right;  . Tonsillectomy  as child  . Cholecystectomy  1990's  . Gastric roux-en-y N/A 11/02/2013    Procedure: LAPAROSCOPIC ROUX-EN-Y GASTRIC BYPASS WITH UPPER ENDOSCOPY;  Surgeon: Gayland Curry, MD;  Location: [facility];  Service: General;  Laterality: N/A;  . Pelvic laparoscopy  1990's    d/t endometriosis  .  Dilitation & currettage/hystroscopy with novasure ablation N/A 05/30/2014    Procedure: DILATATION & CURETTAGE/HYSTEROSCOPY WITH attempted [place] and with IUD removal ;  Surgeon: Jamey Reas de Berton Lan, MD;  Location: [facility];  Service: Gynecology;  Laterality: N/A;  . Hysteroscopy with resectoscope N/A 05/30/2014    Procedure: HYSTEROSCOPY WITH RESECTOSCOPE;  Surgeon: Jamey Reas de Berton Lan, MD;  Location: [facility];  Service: Gynecology;  Laterality: N/A;    Current Outpatient Prescriptions  Medication Sig Dispense Refill  . acetaminophen (TYLENOL) 500 MG tablet Take 1,000 mg by mouth every 6 (six) hours as needed for mild pain.    Marland Kitchen alprazolam (XANAX) 2 MG tablet Take 1-2 mg by mouth 2 (two) times daily as needed for anxiety.     . Calcium Carbonate-Vitamin D  (CALCIUM-D PO) Take 1 tablet by mouth 3 (three) times daily.    . Diclofenac Sodium (PENNSAID) 2 % SOLN Place 2 application onto the skin 2 (two) times daily. 112 g 3  . DULoxetine (CYMBALTA) 60 MG capsule Take 120 mg by mouth every morning.     . Multiple Vitamin (MULTIVITAMIN) tablet Take 1 tablet by mouth daily.    . ondansetron (ZOFRAN) 4 MG tablet TAKE 1 TABLET (4 MG TOTAL) BY MOUTH EVERY 8 (EIGHT) HOURS AS NEEDED FOR NAUSEA OR VOMITING. 9 tablet 0  . zolpidem (AMBIEN) 10 MG tablet Take 10 mg by mouth at bedtime.     Marland Kitchen HYDROcodone-acetaminophen (HYCET) 7.5-325 mg/15 ml solution      No current facility-administered medications for this visit.     ALLERGIES: Sulfa antibiotics and Bactrim  Family History  Problem Relation Age of Onset  . Diabetes Mother   . Kidney disease Mother   . Hypertension Mother   . Cancer Mother     breast cancer  . Breast cancer Mother 50    dx'd with breast ca x2  . Cancer Maternal Grandmother   . Diabetes Maternal Grandmother 85    dec  . Breast cancer Maternal Grandmother   . Diabetes Maternal Grandfather   . Stroke Maternal Grandfather     History   Social History  . Marital Status: Single    Spouse Name: N/A    Number of Children: N/A  . Years of Education: N/A   Occupational History  . Not on file.   Social History Main Topics  . Smoking status: Never Smoker   . Smokeless tobacco: Never Used  . Alcohol Use: 0.5 oz/week    1 drink(s) per week     Comment: 1-2 glasses of wine a few times per month  . Drug Use: No  . Sexual Activity: No     Comment: same sex partner--has Mirena IUD inserted 05/2011   Other Topics Concern  . 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  PHYSICAL  EXAMINATION:    BP 100/62 mmHg  Pulse 68  Resp 16  Ht 5\' 8"  (1.727 m)  Wt 189 lb (85.73 kg)  BMI 28.74 kg/m2     General appearance: alert, cooperative and appears  stated age   Pelvic: External genitalia:  no lesions              Urethra:  normal appearing urethra with no masses, tenderness or lesions              Bartholins and Skenes: normal                 Vagina: normal appearing vagina with normal color and discharge, no lesions              Cervix: normal appearance.  Yellowish mucous in vagina.                    Bimanual Exam:  Uterus:  uterus is normal size, shape, consistency and nontender                                      Adnexa: normal adnexa in size, nontender and no masses                                  ASSESSMENT  Status post endometrial ablation with resection.  Status post Mirena IUD removal.  Patient partner with HPV 18.   PLAN  Bleeding calendar.  Call if irregular bleeding or heavy cycles occur. Annual exam and pap with HR HPV testing yearly.    An After Visit Summary was printed and given to the patient.  _15_____ minutes face to face time of which over 50% was spent in counseling.

## 2014-06-29 ENCOUNTER — Ambulatory Visit (INDEPENDENT_AMBULATORY_CARE_PROVIDER_SITE_OTHER): Payer: BC Managed Care – PPO | Admitting: Family Medicine

## 2014-06-29 ENCOUNTER — Encounter: Payer: Self-pay | Admitting: Family Medicine

## 2014-06-29 VITALS — BP 130/78 | HR 70 | Ht 68.0 in | Wt 187.0 lb

## 2014-06-29 DIAGNOSIS — M1710 Unilateral primary osteoarthritis, unspecified knee: Secondary | ICD-10-CM

## 2014-06-29 MED ORDER — CYANOCOBALAMIN-SALCAPROZATE 1000-100 MCG-MG PO TABS: 1.0000 | ORAL_TABLET | Freq: Every day | ORAL | Status: DC

## 2014-06-29 NOTE — Patient Instructions (Signed)
Good to see you Ice in 6 hours Continue the exercises Pennsaid when you need it.  Try the new B12.  See me again in 3 weeks and if still in pain we can do Synvisc.

## 2014-06-29 NOTE — Assessment & Plan Note (Signed)
Injections today, HEP, Icing, Discussed PT which patient declined.  Discussed the possibility of bracing which patient declined. Patient has artery done all these other conservative therapies. Patient will come back again in 3-4 weeks. At that time we may consider viscous supplementation bilaterally.

## 2014-06-29 NOTE — Progress Notes (Signed)
CC: Bilateral knee pain  HPI: patient does have moderate osteophytic changes of the knees bilaterally. Patient did have stomach bypass surgery recently and has lost 115 pounds. Patient has been more active recently and has been doing more exercising. Patient did respond very well to viscous of limitation in the left knee 5 months ago. Patient states over the course last several weeks though she is to have bilateral pain that is dull and aching worse with certain activities such as squatting or going upstairs. Denies any significant radiation down the leg but is having some numbness on the lateral aspect of the leg. Denies any giving out on her but does have significant amount of grinding that is somewhat painful.  Past medical history, Surgical history, Family history not pertinant except as noted below, Social history, Allergies, and medications have been entered into the medical record, reviewed, and no changes needed.   Review of Systems: No fevers, chills, night sweats, weight loss, chest pain, or shortness of breath.   Objective:   Blood pressure 130/78, pulse 70, height 5\' 8"  (1.727 m), weight 187 lb (84.823 kg), SpO2 98 %.  General: Well Developed, well nourished, and in no acute distress. Obese Neuro: Alert and oriented x3, extra-ocular muscles intact, sensation grossly intact.  HEENT: Normocephalic, atraumatic, pupils equal round reactive to light, neck supple, no masses, no lymphadenopathy, thyroid nonpalpable.  Skin: Warm and dry, no rashes. Cardiac:  no lower extremity edema. Respiratory: Not using accessory muscles, speaking in full sentences. Abdominal: NT, soft Gait: Nonantlagic, good balance and coordination Lymphatic: no lymphadenopathy in neck or axillae on palpation, non tender.  Musculoskeletal: Inspection and palpation of the right and left upper extremities including the shoulders elbows and wrist are unremarkable with full range of motion and good muscle strength and  tone. Inspection and palpation of the right and left lower extremities including the hips  and ankles are unremarkable and nontender with full range of motion and good muscle strength and tone and are symmetric. Knee: bilateral No swelling noted today.Marland Kitchen  Positive medial joint line tenderness, and patellar tenderness, but no condyle tenderness. ROM full in flexion and extension and lower leg rotation. Ligaments with solid consistent endpoints including ACL, PCL, LCL, MCL. Positive Mcmurray's, Apley's, and Thessalonian tests. Painful patellar compression. Patellar glide with significant crepitus. Patellar and quadriceps tendons unremarkable but does have some weakness of the quadriceps on the right side compared to the contralateral side Hamstring strength is normal .  After informed written and verbal consent, patient was seated on exam table. Right knee was prepped with alcohol swab and utilizing anterolateral approach, patient's right knee space was injected with 4:1  marcaine 0.5%: Kenalog 40mg /dL. Patient tolerated the procedure well without immediate complications.  After informed written and verbal consent, patient was seated on exam table. Left knee was prepped with alcohol swab and utilizing anterolateral approach, patient's left knee space was injected with 4:1  marcaine 0.5%: Kenalog 40mg /dL. Patient tolerated the procedure well without immediate complications.

## 2014-07-04 ENCOUNTER — Encounter: Payer: Self-pay | Admitting: Family Medicine

## 2014-07-04 ENCOUNTER — Ambulatory Visit
Admission: RE | Admit: 2014-07-04 | Discharge: 2014-07-04 | Disposition: A | Discharge status: Home / Self Care | Payer: BC Managed Care – PPO | Source: Ambulatory Visit

## 2014-07-04 DIAGNOSIS — Z1231 Encounter for screening mammogram for malignant neoplasm of breast: Secondary | ICD-10-CM

## 2014-07-05 ENCOUNTER — Encounter: Payer: Self-pay | Admitting: Family Medicine

## 2014-07-21 ENCOUNTER — Other Ambulatory Visit (INDEPENDENT_AMBULATORY_CARE_PROVIDER_SITE_OTHER): Payer: BC Managed Care – PPO

## 2014-07-21 ENCOUNTER — Ambulatory Visit (INDEPENDENT_AMBULATORY_CARE_PROVIDER_SITE_OTHER)
Admission: RE | Admit: 2014-07-21 | Discharge: 2014-07-21 | Disposition: A | Discharge status: Home / Self Care | Payer: BC Managed Care – PPO | Source: Ambulatory Visit | Attending: Family Medicine | Admitting: Family Medicine

## 2014-07-21 ENCOUNTER — Encounter: Payer: Self-pay | Admitting: Family Medicine

## 2014-07-21 ENCOUNTER — Ambulatory Visit (INDEPENDENT_AMBULATORY_CARE_PROVIDER_SITE_OTHER): Payer: BC Managed Care – PPO | Admitting: Family Medicine

## 2014-07-21 VITALS — BP 118/76 | HR 86 | Ht 68.0 in | Wt 178.0 lb

## 2014-07-21 DIAGNOSIS — M79672 Pain in left foot: Secondary | ICD-10-CM

## 2014-07-21 DIAGNOSIS — M21372 Foot drop, left foot: Secondary | ICD-10-CM

## 2014-07-21 DIAGNOSIS — R208 Other disturbances of skin sensation: Secondary | ICD-10-CM

## 2014-07-21 DIAGNOSIS — R200 Anesthesia of skin: Secondary | ICD-10-CM

## 2014-07-21 LAB — CBC WITH DIFFERENTIAL/PLATELET
Basophils Absolute: 0 10*3/uL (ref 0.0–0.1)
Basophils Relative: 0.4 % (ref 0.0–3.0)
EOS ABS: 0.2 10*3/uL (ref 0.0–0.7)
Eosinophils Relative: 1.4 % (ref 0.0–5.0)
HEMATOCRIT: 38.8 % (ref 36.0–46.0)
Hemoglobin: 13.1 g/dL (ref 12.0–15.0)
LYMPHS ABS: 2 10*3/uL (ref 0.7–4.0)
Lymphocytes Relative: 17.3 % (ref 12.0–46.0)
MCHC: 33.7 g/dL (ref 30.0–36.0)
MCV: 89.7 fl (ref 78.0–100.0)
MONO ABS: 0.6 10*3/uL (ref 0.1–1.0)
Monocytes Relative: 5.4 % (ref 3.0–12.0)
NEUTROS PCT: 75.5 % (ref 43.0–77.0)
Neutro Abs: 8.8 10*3/uL — ABNORMAL HIGH (ref 1.4–7.7)
PLATELETS: 278 10*3/uL (ref 150.0–400.0)
RBC: 4.33 Mil/uL (ref 3.87–5.11)
RDW: 13.5 % (ref 11.5–15.5)
WBC: 11.6 10*3/uL — ABNORMAL HIGH (ref 4.0–10.5)

## 2014-07-21 LAB — COMPREHENSIVE METABOLIC PANEL
ALBUMIN: 3.9 g/dL (ref 3.5–5.2)
ALK PHOS: 61 U/L (ref 39–117)
ALT: 22 U/L (ref 0–35)
AST: 19 U/L (ref 0–37)
BILIRUBIN TOTAL: 0.5 mg/dL (ref 0.2–1.2)
BUN: 17 mg/dL (ref 6–23)
CO2: 28 mEq/L (ref 19–32)
Calcium: 9 mg/dL (ref 8.4–10.5)
Chloride: 101 mEq/L (ref 96–112)
Creatinine, Ser: 0.6 mg/dL (ref 0.4–1.2)
GFR: 107.17 mL/min (ref >60.00)
Glucose, Bld: 85 mg/dL (ref 70–99)
Potassium: 3.8 mEq/L (ref 3.5–5.1)
Sodium: 135 mEq/L (ref 135–145)
Total Protein: 6.5 g/dL (ref 6.0–8.3)

## 2014-07-21 LAB — SEDIMENTATION RATE: SED RATE: 18 mm/h (ref 0–22)

## 2014-07-21 LAB — TSH: TSH: 1.75 u[IU]/mL (ref 0.35–4.50)

## 2014-07-21 MED ORDER — PREDNISONE 50 MG PO TABS: 50.0000 mg | ORAL_TABLET | Freq: Every day | ORAL | Status: DC

## 2014-07-21 NOTE — Assessment & Plan Note (Addendum)
Discussed with patient at great length. Differential includes lumbar radiculopathy otherwise no other neurologic disorder. Patient does have significant amount weakness. Due to patient's history of different osteophytic changes and patient having some tenderness in her back with no other signs of CVA do think that further workup of her back is necessary. I am concern for the possibility of an L5-S1 nerve root impingement on the left side. X-rays ordered today and I would like to get a stat MRI secondary to the amount of weakness. Patient is following. No beat of clonus of this is likely a peripheral nerve root impingement. Patient will also have labs ordered to see if anything else such as infection could be another etiology but I think this is unlikely. If patient continues to have difficulty we may need to consider further imaging including CT of head but once again primary concern is for lumbar radiculopathy with nerve root impingement of L5-S1. Due to the nature of this being very quick over the course of several weeks and worsening imaging is necessary as soon as possible. Prednisone given in the interim to see if we can improve if anything is secondary to inflammation.

## 2014-07-21 NOTE — Progress Notes (Signed)
  [name] [address] [place], [place] [address] Phone: [phone] Subjective:    CC: Left foot drop  [id number] [name] is a 43 y.o. female coming in with complaint of left foot drop. This is a new problem. Patient has been emailing me about this. Patient states that over the course of the last several weeks she is starting having more and more weakness in the left foot. Patient feels that the lateral aspect of her calf is also numb. This seems to be worsening even though she is doing vitamin D 12 supplementation. Patient over the course of the last year has had gastric bypass surgery and has been doing the nutritional supplements religiously. Patient has not had any other medications. Patient has had some back pain that seems to be somewhat associated with the numbness. Unfortunately it seems that the numbness as well as the foot drop is worsening. Patient has had multiple falls recently. Patient denies any trouble with concentration but has had some mild headaches. Patient has no weakness in any of the other extremities. Patient is concerned because it is affecting her daily activities.     Past medical history, social, surgical and family history all reviewed in electronic medical record.   Review of Systems: No headache, visual changes, nausea, vomiting, diarrhea, constipation, dizziness, abdominal pain, skin rash, fevers, chills, night sweats, weight loss, swollen lymph nodes, body aches, joint swelling, muscle aches, chest pain, shortness of breath, mood changes.   Objective Blood pressure 118/76, pulse 86, height 5\' 8"  (1.727 m), weight 178 lb (80.74 kg), SpO2 97 %.  General: No apparent distress alert and oriented x3 mood and affect normal, dressed appropriately. Continues to lose weight status post gastric bypass surgery HEENT: Pupils equal, extraocular movements intact  Respiratory: Patient's speak in full sentences and does not appear short  of breath  Cardiovascular: No lower extremity edema, non tender, no erythema  Skin: Warm dry intact with no signs of infection or rash on extremities or on axial skeleton.  Abdomen: Soft nontender  Neuro: Cranial nerves II through XII are intact, neurovascularly intact but does have some numbness on the lateral aspect of her calf in the L5 distribution. 2+ pulses.  Lymph: No lymphadenopathy of posterior or anterior cervical chain or axillae bilaterally.  Gait antalgic gait with foot drop on left MSK:  Non tender with full range of motion and good stability and symmetric strength and tone of shoulders, elbows, wrist, hip, knee  Back Exam:  Inspection: Unremarkable  Motion: Flexion 35 deg, Extension 25 deg, Side Bending to 25 deg bilaterally,  Rotation to 45 deg bilaterally  SLR laying: Negative  XSLR laying: Negative  Palpable tenderness: Moderate tenderness in the [facility] spinal musculature on the left side of the lumbar spine FABER: negative. Sensory change: Gross sensation intact to all lumbar and sacral dermatomes.  Reflexes: 2+ at both patellar tendons, 1+ at achilles on left compared to 2+ on right tendons, Babinski's downgoing.  Strength at foot  Plantar-flexion: 4/5 on left compared to 5 out of 5 on right Dorsi-flexion: 2/5 on left compared to 5 out of 5 on right Eversion: 3/5 on left compared to 55 out of 5 on right Inversion: 5/5  Leg strength  Quad: 5/5 Hamstring: 5/5 Hip flexor: 5/5      Impression and Recommendations:     This case required medical decision making of moderate complexity.   ;

## 2014-07-21 NOTE — Patient Instructions (Signed)
Good to see you Prednisone 50mg  daily for 5 days.  Xray downstairs today  Labs today as well.  See me again if all is normal next week.

## 2014-07-22 ENCOUNTER — Other Ambulatory Visit: Payer: Self-pay | Admitting: *Deleted

## 2014-07-22 ENCOUNTER — Ambulatory Visit
Admission: RE | Admit: 2014-07-22 | Discharge: 2014-07-22 | Disposition: A | Discharge status: Home / Self Care | Payer: BC Managed Care – PPO | Source: Ambulatory Visit | Attending: Family Medicine | Admitting: Family Medicine

## 2014-07-22 ENCOUNTER — Ambulatory Visit (INDEPENDENT_AMBULATORY_CARE_PROVIDER_SITE_OTHER)
Admission: RE | Admit: 2014-07-22 | Discharge: 2014-07-22 | Disposition: A | Discharge status: Home / Self Care | Payer: BC Managed Care – PPO | Source: Ambulatory Visit | Attending: Family Medicine | Admitting: Family Medicine

## 2014-07-22 ENCOUNTER — Other Ambulatory Visit: Payer: Self-pay | Admitting: Family Medicine

## 2014-07-22 ENCOUNTER — Telehealth: Payer: Self-pay | Admitting: Family Medicine

## 2014-07-22 DIAGNOSIS — M546 Pain in thoracic spine: Secondary | ICD-10-CM

## 2014-07-22 DIAGNOSIS — M21372 Foot drop, left foot: Secondary | ICD-10-CM

## 2014-07-22 DIAGNOSIS — M79672 Pain in left foot: Secondary | ICD-10-CM

## 2014-07-22 DIAGNOSIS — R200 Anesthesia of skin: Secondary | ICD-10-CM

## 2014-07-22 LAB — PTH, INTACT AND CALCIUM
Calcium: 9.1 mg/dL (ref 8.4–10.5)
PTH: 24 pg/mL (ref 14–64)

## 2014-07-22 NOTE — Telephone Encounter (Signed)
Called patient back and did discuss the MRI results with her. Discuss that this does not look likely what is giving her Discussed with patient that the differential also includes peripheral neurapraxia, it could be contribute in. Discuss further workup. Patient did have some moderate osteophytic check thoracic spine minimally seen on x-ray of the lumbar spine. Patient is going come in for further evaluation of an x-ray. I would like to see patient early next week for further follow-up. Depending on findings we may need to discuss further workup and possible emergent referral to neurology.

## 2014-07-25 ENCOUNTER — Encounter: Payer: Self-pay | Admitting: Family Medicine

## 2014-07-25 ENCOUNTER — Other Ambulatory Visit (INDEPENDENT_AMBULATORY_CARE_PROVIDER_SITE_OTHER): Payer: BC Managed Care – PPO

## 2014-07-25 ENCOUNTER — Ambulatory Visit (INDEPENDENT_AMBULATORY_CARE_PROVIDER_SITE_OTHER): Payer: BC Managed Care – PPO | Admitting: Family Medicine

## 2014-07-25 VITALS — BP 104/62 | HR 59 | Ht 68.0 in | Wt 176.0 lb

## 2014-07-25 DIAGNOSIS — G629 Polyneuropathy, unspecified: Secondary | ICD-10-CM

## 2014-07-25 DIAGNOSIS — M21372 Foot drop, left foot: Secondary | ICD-10-CM

## 2014-07-25 LAB — MAGNESIUM: MAGNESIUM: 1.9 mg/dL (ref 1.5–2.5)

## 2014-07-25 LAB — CK: CK TOTAL: 30 U/L (ref 7–177)

## 2014-07-25 LAB — PHOSPHORUS: Phosphorus: 3.3 mg/dL (ref 2.3–4.6)

## 2014-07-25 LAB — ANGIOTENSIN CONVERTING ENZYME: Angiotensin-Converting Enzyme: 21 U/L (ref 8–52)

## 2014-07-25 NOTE — Assessment & Plan Note (Signed)
We will get more labs including phosphorus, magnesium, ANA and Ace level. I did refer urgently to neuro for further evaluation. I believe that there is a possibility of an EMG or even a spinal tap that may be necessary. It appears the patient is having more of an acute polyneuropathy (? GBS) and will not need to monitor closely.  We will get neck x-rays to rule out any cervical pathology and we may need further imaging.

## 2014-07-25 NOTE — Patient Instructions (Addendum)
Good to see you Continue to moniotr the numbness in the legs and arms.  We will get more labs and get you oin with nuerology.  Labs today and get xrays of neck when you can.  Finish with the prednisone.  If I can help your daughter please give me a call.  Call me if the numbness gets half way up the thigh.  Keep in touch and I will tell you if I need to see you.

## 2014-07-25 NOTE — Progress Notes (Signed)
[name] [address] [place], [place] [address] Phone: [phone] Subjective:    CC: Left foot drop follow up  [id number] [name] is a 43 y.o. female coming in with complaint of left foot drop. Patient was seen last week and did have an acute foot drop. It seemed to be isolated to her left foot previously. Patient is starting to have numbness on the right leg as well. This is now spread from midcalf follow weight 2 proximal aspect of her knee. Patient is still able to ambulate and does not have weakness which is has difficulty feeling. Patient is still continuing to have the left foot drop on the left side. Patient is also stating that she is starting to have some radicular symptoms in her left hand. Not dropping anything but starts to feel like the same distribution of numbness that she feels on her legs. Patient denies any fevers or chills or any abnormal weight loss. Patient though has lost a significant amount of weight after her gastric bypass surgery. So far labs and imaging studies have been unremarkable for any signs. Patient did have a significant cold 2 months ago. Patient is on prednisone at this time with no significant improvement.  Workup included an MRI of the lumbar spine that did not show any nerve root impingement. X-rays of the thoracic spine shows mild to moderate degenerative changes but no signs of discitis.     Past medical history, social, surgical and family history all reviewed in electronic medical record.   Review of Systems: No headache, visual changes, nausea, vomiting, diarrhea, constipation, dizziness, abdominal pain, skin rash, fevers, chills, night sweats, weight loss, swollen lymph nodes, body aches, joint swelling, muscle aches, chest pain, shortness of breath, mood changes.   Objective Blood pressure 104/62, pulse 59, height 5\' 8"  (1.727 m), weight 176 lb (79.833 kg), last menstrual period 07/04/2014, SpO2 99 %.    General: No apparent distress alert and oriented x3 mood and affect normal, dressed appropriately. Continues to lose weight status post gastric bypass surgery HEENT: Pupils equal, extraocular movements intact  Respiratory: Patient's speak in full sentences and does not appear short of breath  Cardiovascular: No lower extremity edema, non tender, no erythema  Skin: Warm dry intact with no signs of infection or rash on extremities or on axial skeleton.  Abdomen: Soft nontender  Neuro: Cranial nerves II through XII are intact, neurovascularly intact but does have some numbness on the lateral aspect of her calf in the L5 distribution. 2+ pulses.  Lymph: No lymphadenopathy of posterior or anterior cervical chain or axillae bilaterally.  Gait antalgic gait with foot drop on left MSK:  Non tender with full range of motion and good stability and symmetric strength and tone of shoulders, elbows, wrist, hip, knee  Back Exam:  Inspection: Unremarkable  Motion: Flexion 35 deg, Extension 25 deg, Side Bending to 25 deg bilaterally,  Rotation to 45 deg bilaterally  SLR laying: Negative  XSLR laying: Negative  Palpable tenderness: Nontender on exam FABER: negative. Sensory change: Gross sensation intact to all lumbar and sacral dermatomes.  Reflexes: 1+ of the left patella compared to the contralateral side., Now patient has 1+ Achilles reflexes bilaterally. Hand exam is unremarkable at this time with minimal numbness of the left forearm compared to the contralateral side subjectively.  Strength at foot  Plantar-flexion: 4/5 on left compared to 5 out of 5 on right Dorsi-flexion: 2/5 on left compared to 5 out of  5 on right Eversion: 3/5 on left compared to 5 out of 5 on right Inversion: 5/5  Leg strength  Quad: 5/5 Hamstring: 5/5 Hip flexor: 5/5      Impression and Recommendations:     This case required medical decision making of moderate complexity.   ;

## 2014-07-26 ENCOUNTER — Ambulatory Visit (INDEPENDENT_AMBULATORY_CARE_PROVIDER_SITE_OTHER)
Admission: RE | Admit: 2014-07-26 | Discharge: 2014-07-26 | Disposition: A | Discharge status: Home / Self Care | Payer: BC Managed Care – PPO | Source: Ambulatory Visit | Attending: Family Medicine | Admitting: Family Medicine

## 2014-07-26 DIAGNOSIS — G629 Polyneuropathy, unspecified: Secondary | ICD-10-CM

## 2014-07-26 LAB — ANA: ANA: NEGATIVE

## 2014-07-28 ENCOUNTER — Encounter: Payer: Self-pay | Admitting: Neurology

## 2014-07-28 ENCOUNTER — Ambulatory Visit (INDEPENDENT_AMBULATORY_CARE_PROVIDER_SITE_OTHER): Payer: BC Managed Care – PPO | Admitting: Neurology

## 2014-07-28 VITALS — BP 108/78 | HR 73 | Ht 68.0 in | Wt 175.4 lb

## 2014-07-28 DIAGNOSIS — M62838 Other muscle spasm: Secondary | ICD-10-CM

## 2014-07-28 DIAGNOSIS — R29818 Other symptoms and signs involving the nervous system: Secondary | ICD-10-CM

## 2014-07-28 DIAGNOSIS — M6249 Contracture of muscle, multiple sites: Secondary | ICD-10-CM

## 2014-07-28 DIAGNOSIS — R292 Abnormal reflex: Secondary | ICD-10-CM

## 2014-07-28 DIAGNOSIS — R202 Paresthesia of skin: Secondary | ICD-10-CM

## 2014-07-28 DIAGNOSIS — M21372 Foot drop, left foot: Secondary | ICD-10-CM

## 2014-07-28 DIAGNOSIS — R209 Unspecified disturbances of skin sensation: Secondary | ICD-10-CM

## 2014-07-28 DIAGNOSIS — G609 Hereditary and idiopathic neuropathy, unspecified: Secondary | ICD-10-CM

## 2014-07-28 NOTE — Patient Instructions (Addendum)
1.  EMG of the left arm and leg 2.  MRI brain and cervical spine wwo contrast 3.  We will call you with the results of the testing 4.  Return to clinic in 6 weeks

## 2014-07-28 NOTE — Progress Notes (Signed)
[facility] Clinic Note - Initial Visit   Date: 12/17/[name]  [name] MRN: [id number] DOB: [birth year]   Dear Dr. Tamala Julian:  Thank you for your kind referral of [name] for consultation of generalized paresthesias ?GBS. Although her history is well known to you, please allow [place] to reiterate it for the purpose of our medical record. The patient was accompanied to the clinic by self.    History of Present Illness: [name] is a 43 y.o. right-handed Caucasian female with depression/anxiety Cheryl prior obesity s/p gastric bypass (March [name], Cheryl an amazing 120lb+), Cheryl arthritis of the knees presenting for evaluation of generalized paresthesias.  Around late September [name], she developed numbness of her 4th toe which progressed to involve the dorsum of the foot over a few weeks.  Soon, she noticed tingling/numbness over the left lateral lower leg.  Early December, she developed similar numbness/tingling over the right leg (toes, dorsum of the foot, Cheryl lateral leg).  This week, left lateral thigh started feeling different Cheryl burning pain of her left > right hand Cheryl forearm.  Over the past few days, her face has become numbness (nose, lips).   She feels that she is walking funny Cheryl is falling about once every other day.  She is able to get up by herself Cheryl has not sustained traumatic injuries, but had bruised herself quite a bit.  There is no preceding lightheadedness, changes in vision, or palpitations.  She feels that she is tripping over her own toes.  She has been having a lot of cramp Cheryl muscle spasms in her left lower leg.  Two weeks ago she had low back pain, which is also new.  She endorses dry eyes Cheryl dry mouth. She denies any bowel/bladder incontinence, drooling, difficulty swallowing.  Her taste has also been more bland lately.  She was diabetic from 2011-[name] with HbA1c ranging ~8.4% but this resolved after her weight loss surgery.    She  has been seeing Dr. Tamala Julian for ongoing problems with her knees Cheryl during his evaluation in early December, she had raised these new symptoms to him Cheryl her exam showed new left foot drop.  MRI of the lumbar spine wo contrast was ordered Cheryl did not show any nerve root impingement. She was given a trial of prednisone taper over one week, she denies noting any huge difference.    No history of vision loss, worsening of symptoms with temperature changes.   Out-side paper records, electronic medical record, Cheryl images have been reviewed where available Cheryl summarized as:  MRI lumbar spine wo contrast 12/11/[name]: Minor lumbar spondylosis. No compressive lesion. No intraspinal abnormality.  [name]:  ANA negative, ACE 21, CK 30, TSH 1.75, ESR 18  Past Medical History  Diagnosis Date  . Headache(784.0)     tension or sinus  . Arthritis     right knee  . Depression   . Anxiety   . Medial meniscus tear 12/2012    right knee  . History of MRSA infection prior to 2012    lasted 5-6 yr.  . Dental crowns present   . Jaw snapping     states jaw pops  . Insect bites 12/24/2012    leg  . Complication of anesthesia     has been hard to wake up post-op  . DVT of lower extremity (deep venous thrombosis) 2012    LLE  . Acute meniscal tear of knee 09/22/2012    Right knee.    Marland Kitchen  Chondromalacia 04/30/2013    Seen on MRI as well as during surgery by Dr. Rhona Raider Tricompartmental degenerative changes   . Primary localized osteoarthrosis, lower leg 07/06/2013    Synvisc series started July 06, 2013   . Dysmenorrhea   . Endometriosis   . Fibroid   . Gestational diabetes 2004    wt loss surgery has resulted in normal A1C  . T2DM (type 2 diabetes mellitus) 05/2013    pt says DM resolved with wt loss    Past Surgical History  Procedure Laterality Date  . Hand surgery Left     X 3 - spider bite Cheryl MRSA infection  . Wrist surgery Right 2005  . Knee arthroscopy Right 12/29/2012     Procedure: RIGHT KNEE ARTHROSCOPY  PARTIAL MEDIAL Cheryl LATERAL MENISECTOMY WITH CHONDROPLASTY;  Surgeon: Hessie Dibble, MD;  Location: [facility];  Service: Orthopedics;  Laterality: Right;  . Tonsillectomy  as child  . Cholecystectomy  1990's  . Gastric roux-en-y N/A 3/24/[name]    Procedure: LAPAROSCOPIC ROUX-EN-Y GASTRIC BYPASS WITH UPPER ENDOSCOPY;  Surgeon: Gayland Curry, MD;  Location: [facility];  Service: General;  Laterality: N/A;  . Pelvic laparoscopy  1990's    d/t endometriosis  . Dilitation & currettage/hystroscopy with novasure ablation N/A 10/19/[name]    Procedure: DILATATION & CURETTAGE/HYSTEROSCOPY WITH attempted [place] Cheryl with IUD removal ;  Surgeon: Jamey Reas de Berton Lan, MD;  Location: [facility];  Service: Gynecology;  Laterality: N/A;  . Hysteroscopy with resectoscope N/A 10/19/[name]    Procedure: HYSTEROSCOPY WITH RESECTOSCOPE;  Surgeon: Jamey Reas de Berton Lan, MD;  Location: [facility];  Service: Gynecology;  Laterality: N/A;     Medications:  Current Outpatient Prescriptions on File Prior to Visit  Medication Sig Dispense Refill  . acetaminophen (TYLENOL) 500 MG tablet Take 1,000 mg by mouth every 6 (six) hours as needed for mild pain.    Marland Kitchen alprazolam (XANAX) 2 MG tablet Take 1-2 mg by mouth 2 (two) times daily as needed for anxiety.     . Cyanocobalamin-Salcaprozate (ELIGEN B12) 1000-100 MCG-MG TABS Take 1 tablet by mouth daily. 90 tablet 0  . Diclofenac Sodium (PENNSAID) 2 % SOLN Place 2 application onto the skin 2 (two) times daily. 112 g 3  . DULoxetine (CYMBALTA) 60 MG capsule Take 120 mg by mouth every morning.     . Multiple Vitamin (MULTIVITAMIN) tablet Take 1 tablet by mouth daily.    . ondansetron (ZOFRAN) 4 MG tablet TAKE 1 TABLET (4 MG TOTAL) BY MOUTH EVERY 8 (EIGHT) HOURS AS NEEDED FOR NAUSEA OR VOMITING. 9 tablet 0  . zolpidem (AMBIEN) 10 MG tablet Take 10 mg by mouth at bedtime.      No current  facility-administered medications on file prior to visit.    Allergies:  Allergies  Allergen Reactions  . Sulfa Antibiotics Other (See Comments)    HALLUCINATIONS, FEVER, CHILLS  . Bactrim [Sulfamethoxazole-Trimethoprim] Other (See Comments)    Hallucinations, fever chills    Family History: Family History  Problem Relation Age of Onset  . Diabetes Mother     Living  . Kidney disease Mother   . Hypertension Mother   . Cancer Mother     breast cancer  . Breast cancer Mother 68    dx'd with breast ca x2  . Cancer Maternal Grandmother   . Diabetes Maternal Grandmother 11    dec  . Breast cancer Maternal Grandmother   .  Diabetes Maternal Grandfather   . Stroke Maternal Grandfather   . Arthritis Father   . Healthy Sister     x2  . Healthy Daughter     Social History: History   Social History  . Marital Status: Single    Spouse Name: N/A    Number of Children: N/A  . Years of Education: N/A   Occupational History  . Not on file.   Social History Main Topics  . Smoking status: Never Smoker   . Smokeless tobacco: Never Used  . Alcohol Use: 0.6 oz/week    1 Not specified per week     Comment: 1-2 glasses of wine a few times per month  . Drug Use: No  . Sexual Activity: Not Currently   Other Topics Concern  . 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   EYES: No visual changes or eye pain ENT: No hearing changes.  No history of nose bleeds.   RESPIRATORY: No cough, wheezing Cheryl shortness of breath.   CARDIOVASCULAR: Negative for chest pain, Cheryl palpitations.   GI: Negative for abdominal discomfort, blood in  stools or black stools.  No recent change in bowel habits.   GU:  No history of incontinence.   MUSCLOSKELETAL: +history of joint pain or swelling.  No myalgias.   SKIN: Negative for lesions, rash, Cheryl itching.   HEMATOLOGY/ONCOLOGY: Negative for prolonged bleeding, bruising easily, Cheryl swollen nodes.  No history of cancer.   ENDOCRINE: Negative for cold or heat intolerance, polydipsia or goiter.   PSYCH:  +depression or anxiety symptoms.   NEURO: As Above.   Vital Signs:  BP 108/78 mmHg  Pulse 73  Ht _0  (1.727 m)  Wt 175 lb 6 oz (79.55 kg)  BMI 26.67 kg/m2  SpO2 98%  LMP 11/04/[name] (Exact Date)   General Medical Exam:   General:  Well appearing, comfortable.   Eyes/ENT: see cranial nerve examination.   Neck: No masses appreciated.  Full range of motion without tenderness.  No carotid bruits. Respiratory:  Clear to auscultation, good air entry bilaterally.   Cardiac:  Regular rate Cheryl rhythm, no murmur.   Extremities:  No deformities, edema, or skin discoloration.  Skin:  No rashes or lesions.  Neurological Exam: MENTAL STATUS including orientation to time, place, person, recent Cheryl remote memory, attention span Cheryl concentration, language, Cheryl fund of knowledge is normal.  Speech is not dysarthric.  CRANIAL NERVES: II:  No visual field defects.  Unremarkable fundi.   III-IV-VI: Pupils equal round Cheryl reactive to light.  Normal conjugate, extra-ocular eye movements in all directions of gaze.  No nystagmus.  No ptosis.   V:  Reduced pin prick over the left cheek, otherwise sensation is intact.  Jaw jerk is absent.   VII:  Normal facial symmetry Cheryl movements.  No pathologic facial reflexes.  VIII:  Normal hearing Cheryl vestibular function.   IX-X:  Normal palatal movement.   XI:  Normal shoulder shrug Cheryl head  rotation.   XII:  Normal tongue strength Cheryl range of motion, no deviation or fasciculation.  MOTOR:  No atrophy, fasciculations or abnormal movements.  No pronator  drift.  Tone is normal.    Right Upper Extremity:    Left Upper Extremity:    Deltoid  5/5   Deltoid  5/5   Biceps  5/5   Biceps  5/5   Triceps  5/5   Triceps  5/5   Wrist extensors  5/5   Wrist extensors  5/5   Wrist flexors  5/5   Wrist flexors  5/5   Finger extensors  5/5   Finger extensors  5/5   Finger flexors  5/5   Finger flexors  5/5   Dorsal interossei  5/5   Dorsal interossei  5/5   Abductor pollicis  5/5   Abductor pollicis  5/5   Tone (Ashworth scale)  0  Tone (Ashworth scale)  0   Right Lower Extremity:    Left Lower Extremity:    Hip flexors  5/5   Hip flexors  5/5   Hip extensors  5/5   Hip extensors  5/5   Knee flexors  5/5   Knee flexors  5/5   Knee extensors  5/5   Knee extensors  5/5   Inversion 5/5  Inversion 4/5  Eversion 5/5  Eversion 1/5  Dorsiflexors  5/5   Dorsiflexors  1/5   Plantarflexors  5/5   Plantarflexors  5-/5   Toe extensors  5/5   Toe extensors  3/5   Toe flexors  5/5   Toe flexors  5-/5   Tone (Ashworth scale)  0  Tone (Ashworth scale)  1   MSRs:  Right                                                                 Left brachioradialis 3+  brachioradialis 3+  biceps 3+  biceps 3+  triceps 3+  triceps 3+  patellar 2+  patellar 3+  ankle jerk 2+  ankle jerk 2+  Hoffman no  Hoffman no  plantar response down  plantar response down   SENSORY:  Pin prick intact in the upper extremities, absent over the left lateral thigh Cheryl lower leg bilaterally as well as over the feet.  Vibration intact in the arms, reduced to 50% at the knees Cheryl absent distal to ankles.  Temperature absent in the feet.  Prioprioception is intact.  Rhomberg is negative.  COORDINATION/GAIT: Normal finger-to- nose-finger Cheryl heel-to-shin.  Intact rapid alternating movements bilaterally.  Able to rise from a chair without using arms.  High steppage gait on the left, appears stable Cheryl cautious.  She is able to perform tandem gait Cheryl walk on toes.  Unable to even stand on heels  on left.   IMPRESSION: [name] is a 43 year-old female presenting for evaluation of gradually progressive paresthesias (legs > arms > face) Cheryl left foot drop.  Her exam shows marked weakness involving the left foot with dorsiflexion, eversion, Cheryl toe extension concerning for peroneal mononeuropathy vs L5 radiculopathy, but her non-contrasted MRI lumbar spine did not show any structural abnormalities.  Additionally, she now has paresthesias of the face Cheryl arms, in addition to bilateral lower extremities.  Although  a demyelinating peripheral neuropathy such as AIDP/CIDP needs to be considered, the fact that her reflexes are brisk would be atypical.  Further, she has spasticity in her left leg Cheryl extensor plantar response on the right. These upper motor neuron exam findings are more concerning for a central structural/demyelinating process, so I will proceed with imaging the brain Cheryl cervical spine.  For completeness, electrodiagnostic testing of the left side will be performed to be sure a peripheral nerve process (CIDP or mononeuritis multiplex) is not missed.     PLAN/RECOMMENDATIONS:  1.  MRI brain Cheryl cerivical spine wwo contrast 2.  EMG of the left arm Cheryl legs 3.  Consider CSF testing going forward, pending above results 4.  Return to clinic in 6-weeks   The duration of this appointment visit was 60 minutes of face-to-face time with the patient.  Greater than 50% of this time was spent in counseling, explanation of diagnosis, planning of further management, Cheryl coordination of care.   Thank you for allowing me to participate in patient's care.  If I can answer any additional questions, I would be pleased to do so.    Sincerely,    Donika K. Posey Pronto, DO

## 2014-07-29 ENCOUNTER — Ambulatory Visit
Admission: RE | Admit: 2014-07-29 | Discharge: 2014-07-29 | Disposition: A | Discharge status: Home / Self Care | Payer: BC Managed Care – PPO | Source: Ambulatory Visit | Attending: Neurology | Admitting: Neurology

## 2014-07-29 ENCOUNTER — Telehealth: Payer: Self-pay | Admitting: Neurology

## 2014-07-29 DIAGNOSIS — M21372 Foot drop, left foot: Secondary | ICD-10-CM

## 2014-07-29 DIAGNOSIS — R29818 Other symptoms and signs involving the nervous system: Secondary | ICD-10-CM

## 2014-07-29 DIAGNOSIS — R202 Paresthesia of skin: Secondary | ICD-10-CM

## 2014-07-29 DIAGNOSIS — R209 Unspecified disturbances of skin sensation: Secondary | ICD-10-CM

## 2014-07-29 DIAGNOSIS — M62838 Other muscle spasm: Secondary | ICD-10-CM

## 2014-07-29 DIAGNOSIS — R292 Abnormal reflex: Secondary | ICD-10-CM

## 2014-07-29 MED ORDER — GADOBENATE DIMEGLUMINE 529 MG/ML IV SOLN
17.0000 mL | Freq: Once | INTRAVENOUS | Status: AC | PRN
  Administered 2014-07-29: 17 mL via INTRAVENOUS

## 2014-07-29 NOTE — Telephone Encounter (Signed)
Called and notified the patient that her MRI of the brain and cervical spine did not show any changes. EMG shows bilateral peroneal mononeuropathy worse on the left where there is active changes. Possibilities remain GBS and mononeuritis multiplex. We'll proceed with laboratory testing and lumbar puncture.  Check CRP, c-ANCA, p-ANCA, cryoglobulins, ANA, ENA, RF, hepatitis panel, ACE, vitamin B12, SPEP/UPEP with IFE, copper, ceruloplasmin, TSH, selenium, vitamin B1   CSF analysis:  CSF cell count and diff, protein, glucose, routine cultures, fungal cultures, IgG index, oligoclonal bands, myelin basic protein, flow cytology, flow cytometry (cytospin), ACE, CSF lyme PCR, CSF VZV PCR, CSF HSV  Donika K. Posey Pronto, DO

## 2014-08-01 ENCOUNTER — Other Ambulatory Visit: Payer: Self-pay | Admitting: *Deleted

## 2014-08-01 DIAGNOSIS — G5731 Lesion of lateral popliteal nerve, right lower limb: Secondary | ICD-10-CM

## 2014-08-01 DIAGNOSIS — G609 Hereditary and idiopathic neuropathy, unspecified: Secondary | ICD-10-CM

## 2014-08-01 DIAGNOSIS — G5732 Lesion of lateral popliteal nerve, left lower limb: Secondary | ICD-10-CM

## 2014-08-01 NOTE — Procedures (Signed)
[facility]  [address]  [place], [place] [address] Tel: [phone] Fax:  [phone] Test Date:  07/28/2014  Patient: [name] DOB: 09/08/1970 Physician: Narda Amber, DO  Sex: Female Height: 5\' 8"  Ref Phys: Narda Amber  ID#: [id number] Temp: 33.0C Technician: Laureen Ochs R. NCS T.   Patient Complaints: Patient is a 43 year old female here for evaluation of left foot drop and generalized paresthesias.  NCV & EMG Findings: Extensive electrodiagnostic testing of the left upper and lower extremity and additional studies of the right lower extremity shows:  1. All sensory responses in the arms including the median, ulnar, and radial nerves are within normal limits. Nerves 2. All motor responses in the upper extremity including the median and ulnar nerves are within normal limits.  3. All sensory responses in the legs including the left sural and bilateral superficial peroneal nerves are within normal limits.  4. Peroneal motor responses recording at the extensor digitorum brevis and tibialis anterior show reduced amplitude with conduction block and slowing at the fibular head. The left tibial motor response is within normal limits. There is no temporal dispersion. 5. F wave studies in H reflexes studies are within normal limits. 6. In the arms, there is no evidence of active or chronic motor axon loss changes affecting any of the tested muscles. 7. In the legs, there is active on chronic motor axon loss changes affecting the tibialis anterior, fibularis longus, extensor hallucis longus muscles on the left. Similar findings are seen to a lesser extent affecting the right tibialis anterior and gluteus medius muscle.   Impression: 1. There is electrophysiologic evidence of an asymmetric acute common peroneal neuropathy at the fibular head affecting bilateral lower extremities, predominately demyelinating with secondary axon loss. These findings are severe in  degree electrically on the left, and mild on the right.  2. A superimposed L5 radiculopathy cannot be excluded.   ___________________________ Narda Amber, DO    Nerve Conduction Studies Anti Sensory Summary Table   Site NR Peak (ms) Norm Peak (ms) P-T Amp (V) Norm P-T Amp  Left Median Anti Sensory (2nd Digit)  31C  Wrist    2.9 <3.4 49.9 >20  Left Radial Anti Sensory (Base 1st Digit)  33C  Wrist    2.1 <2.7 50.1 >18  Left Sup Peroneal Anti Sensory (Ant Lat Mall)  31C  12 cm    3.5 <4.5 7.5 >5  Right Sup Peroneal Anti Sensory (Ant Lat Mall)  31C  12 cm    3.0 <4.5 10.1 >5  Left Sural Anti Sensory (Lat Mall)  31C  Calf    3.5 <4.5 9.6 >5  Left Ulnar Anti Sensory (5th Digit)  33C  Wrist    2.5 <3.1 43.9 >12   Motor Summary Table   Site NR Onset (ms) Norm Onset (ms) O-P Amp (mV) Norm O-P Amp Site1 Site2 Delta-0 (ms) Dist (cm) Vel (m/s) Norm Vel (m/s)  Left Median Motor (Abd Poll Brev)  33C  Wrist    3.0 <3.9 7.8 >6 Elbow Wrist 5.0 30.0 60 >50  Elbow    8.0  7.8         Left Peroneal Motor (Ext Dig Brev)  32C  Ankle    4.1 <5.5 2.7 >3 B Fib Ankle 8.7 36.5 42 >40  B Fib    12.8  2.0  Poplt B Fib 2.9 9.0 31 >40  Poplt    15.7  0.2  Post-exercise    3.7  2.6         Right Peroneal Motor (Ext Dig Brev)  32C  Ankle    3.6 <5.5 3.6 >3 B Fib Ankle 8.4 37.0 44 >40  B Fib    12.0  2.7  Poplt B Fib 2.1 8.0 38 >40  Poplt    14.1  1.7         Left Peroneal TA Motor (Tib Ant)  32C  Fib Head    2.6 <4.0 1.2 >4 Poplit Fib Head 9.6 9.0 9 >40  Poplit    12.2  0.1         Left Tibial Motor (Abd Hall Brev)  32C  Ankle    4.6 <6.0 11.8 >8 Knee Ankle 8.6 38.5 45 >40  Knee    13.2  9.8         Left Ulnar Motor (Abd Dig Minimi)  33C  Wrist    2.2 <3.1 9.6 >7 B Elbow Wrist 3.9 23.0 59 >50  B Elbow    6.1  9.4  A Elbow B Elbow 2.0 10.0 50 >50  A Elbow    8.1  9.2          F Wave Studies   NR F-Lat (ms) Lat Norm (ms) L-R F-Lat (ms)  Left Tibial (Mrkrs) (Abd Hallucis)   31C     51.63 <55   Left Ulnar (Mrkrs) (Abd Dig Min)  33C     30.53 <33    H Reflex Studies   NR H-Lat (ms) Lat Norm (ms) L-R H-Lat (ms)  Left Tibial (Gastroc)  31C     32.79 <35 0.00  Right Tibial (Gastroc)  31C     32.79 <35 0.00   EMG   Side Muscle Ins Act Fibs Psw Fasc Number Recrt Dur Dur. Amp Amp. Poly Poly. Comment  Left 1stDorInt Nml Nml Nml Nml Nml Nml Nml Nml Nml Nml Nml Nml N/A  Left FlexPolLong Nml Nml Nml Nml Nml Nml Nml Nml Nml Nml Nml Nml N/A  Left Ext Indicis Nml Nml Nml Nml Nml Nml Nml Nml Nml Nml Nml Nml N/A  Left PronatorTeres Nml Nml Nml Nml Nml Nml Nml Nml Nml Nml Nml Nml N/A  Left Biceps Nml Nml Nml Nml Nml Nml Nml Nml Nml Nml Nml Nml N/A  Left Triceps Nml Nml Nml Nml Nml Nml Nml Nml Nml Nml Nml Nml N/A  Left Deltoid Nml Nml Nml Nml Nml Nml Nml Nml Nml Nml Nml Nml N/A  Left Cervical Parasp Low Nml Nml Nml Nml Nml Nml Nml Nml Nml Nml Nml Nml N/A  Left Fibularis Long Nml 2+ Nml Nml SMU Rapid All 1+ All 1+ All 1+ N/A  Left BicepsFemS Nml Nml Nml Nml Nml Nml Nml Nml Nml Nml Nml Nml N/A  Left GluteusMed Nml Nml Nml Nml 1+ Rapid Few 1+ Few 1+ Few 1+ N/A  Left RectFemoris Nml Nml Nml Nml Nml Nml Nml Nml Nml Nml Nml Nml N/A  Left AntTibialis Nml 2+ Nml Nml SMU Mod-R Nml Nml Nml Nml Nml Nml N/A  Left Gastroc Nml Nml Nml Nml Nml Nml Nml Nml Nml Nml Nml Nml N/A  Left Flex Dig Long Nml Nml Nml Nml 1- Mod-V Nml Nml Nml Nml Nml Nml N/A  Left ExtHallLong Nml 2+ Nml Nml 1- None - - - - - - N/A  Right AntTibialis Nml 1+ Nml Nml 2- Rapid Some 1+ Some 1+ Nml Nml N/A  Right Fibularis Long Nml Nml Nml Nml Nml Nml Nml Nml Nml Nml Nml Nml N/A  Right BicepsFemS Nml Nml Nml Nml Nml Nml Nml Nml Nml Nml Nml Nml N/A  Right GluteusMed Nml Nml Nml Nml 1- Mod-R Few 1+ Few 1+ Nml Nml N/A  Right Gastroc Nml Nml Nml Nml Nml Nml Nml Nml Nml Nml Nml Nml N/A      Waveforms:

## 2014-08-02 ENCOUNTER — Encounter: Payer: Self-pay | Admitting: Neurology

## 2014-08-02 ENCOUNTER — Other Ambulatory Visit: Payer: Self-pay | Admitting: Neurology

## 2014-08-02 NOTE — Telephone Encounter (Signed)
Patient emailed this morning.  I have emailed her back about coming in to get labs and let her know that I am waiting for LP appointment.

## 2014-08-03 ENCOUNTER — Encounter: Payer: Self-pay | Admitting: Family Medicine

## 2014-08-03 LAB — ENA 9 PANEL
Centromere Ab Screen: <1
ENA SM Ab Ser-aCnc: <1
Jo-1 Antibody, IgG: <1
Ribosomal P Protein Ab: <1
SCLERODERMA (SCL-70) (ENA) ANTIBODY, IGG: NEGATIVE
SM/RNP: NEGATIVE
SSA (Ro) (ENA) Antibody, IgG: <1
SSB (La) (ENA) Antibody, IgG: <1
ds DNA Ab: <1 IU/mL

## 2014-08-03 LAB — HEPATITIS PANEL, ACUTE
HCV AB: NEGATIVE
HEP A IGM: NONREACTIVE
Hep B C IgM: NONREACTIVE
Hepatitis B Surface Ag: NEGATIVE

## 2014-08-03 LAB — RHEUMATOID FACTOR: Rhuematoid fact SerPl-aCnc: <10 IU/mL (ref <=14)

## 2014-08-03 LAB — PAN-ANCA
Atypical p-ANCA Screen: NEGATIVE
Myeloperoxidase Abs: <1
Serine Protease 3: <1
c-ANCA Screen: NEGATIVE
p-ANCA Screen: NEGATIVE

## 2014-08-03 LAB — ANGIOTENSIN CONVERTING ENZYME: Angiotensin-Converting Enzyme: 31 U/L (ref 8–52)

## 2014-08-03 LAB — C-REACTIVE PROTEIN

## 2014-08-03 LAB — TSH: TSH: 0.925 u[IU]/mL (ref 0.350–4.500)

## 2014-08-03 LAB — VITAMIN B12: VITAMIN B 12: 733 pg/mL (ref 211–911)

## 2014-08-03 LAB — ANA: ANA: NEGATIVE

## 2014-08-04 LAB — SPEP & IFE WITH QIG
ALBUMIN ELP: 61.8 % (ref 55.8–66.1)
ALPHA-2-GLOBULIN: 10.2 % (ref 7.1–11.8)
Alpha-1-Globulin: 4 % (ref 2.9–4.9)
BETA 2: 5.1 % (ref 3.2–6.5)
Beta Globulin: 5.8 % (ref 4.7–7.2)
Gamma Globulin: 13.1 % (ref 11.1–18.8)
IGG (IMMUNOGLOBIN G), SERUM: 912 mg/dL (ref 690–1700)
IgA: 164 mg/dL (ref 69–380)
IgM, Serum: 49 mg/dL — ABNORMAL LOW (ref 52–322)
TOTAL PROTEIN, SERUM ELECTROPHOR: 7.1 g/dL (ref 6.0–8.3)

## 2014-08-04 LAB — CERULOPLASMIN: Ceruloplasmin: 29 mg/dL (ref 18–53)

## 2014-08-04 LAB — SELENIUM SERUM: Selenium, Blood: 130 mcg/L (ref 63–160)

## 2014-08-04 LAB — COPPER, SERUM: COPPER: 129 ug/dL (ref 70–175)

## 2014-08-04 NOTE — Telephone Encounter (Signed)
Caryl Pina, please f/u on her LP appointment, thanks.  Donika K. Posey Pronto, DO

## 2014-08-04 NOTE — Telephone Encounter (Signed)
Labs have been done.  LP is scheduled for December 30 at 9:45.

## 2014-08-07 LAB — CRYOGLOBULIN

## 2014-08-08 LAB — UIFE/LIGHT CHAINS/TP QN, 24-HR UR
Albumin, U: DETECTED
TOTAL PROTEIN, URINE-UPE24: 13 mg/dL (ref 5–24)

## 2014-08-10 ENCOUNTER — Ambulatory Visit: Payer: BC Managed Care – PPO | Admitting: Family Medicine

## 2014-08-10 ENCOUNTER — Other Ambulatory Visit (HOSPITAL_COMMUNITY)
Admission: RE | Admit: 2014-08-10 | Discharge: 2014-08-10 | Disposition: A | Discharge status: Home / Self Care | Payer: BLUE CROSS/BLUE SHIELD | Source: Ambulatory Visit | Attending: Neurology | Admitting: Neurology

## 2014-08-10 ENCOUNTER — Ambulatory Visit
Admission: RE | Admit: 2014-08-10 | Discharge: 2014-08-10 | Disposition: A | Discharge status: Home / Self Care | Payer: BC Managed Care – PPO | Source: Ambulatory Visit | Attending: Neurology | Admitting: Neurology

## 2014-08-10 ENCOUNTER — Other Ambulatory Visit: Payer: Self-pay | Admitting: Neurology

## 2014-08-10 DIAGNOSIS — R292 Abnormal reflex: Secondary | ICD-10-CM | POA: Diagnosis not present

## 2014-08-10 DIAGNOSIS — G5731 Lesion of lateral popliteal nerve, right lower limb: Secondary | ICD-10-CM

## 2014-08-10 DIAGNOSIS — G5732 Lesion of lateral popliteal nerve, left lower limb: Secondary | ICD-10-CM

## 2014-08-10 LAB — CSF CELL COUNT WITH DIFFERENTIAL
RBC COUNT CSF: 0 uL
Tube #: 4
WBC, CSF: 2 cu mm (ref 0–5)

## 2014-08-10 LAB — PROTEIN, CSF: Total Protein, CSF: 33 mg/dL (ref 15–45)

## 2014-08-10 LAB — GLUCOSE, CSF: Glucose, CSF: 50 mg/dL (ref 43–76)

## 2014-08-10 LAB — FUNGUS CULTURE W SMEAR: Smear Result: NONE SEEN

## 2014-08-10 NOTE — Discharge Instructions (Signed)

## 2014-08-10 NOTE — Progress Notes (Addendum)
1000 blood drawn from right [facility] for testing with spinal fluid. Site is unremarkable and 3 SST tubes of blood collected. Pt tolerated well.  Discharge instructions explained to pt and friend.

## 2014-08-12 DIAGNOSIS — M21372 Foot drop, left foot: Secondary | ICD-10-CM

## 2014-08-12 HISTORY — DX: Foot drop, left foot: M21.372

## 2014-08-12 LAB — VARICELLA-ZOSTER BY PCR: VZV DNA, QL PCR: NOT DETECTED

## 2014-08-14 LAB — ANGIOTENSIN CONVERTING ENZYME, CSF: ACE, CSF: 8 U/L (ref <=15)

## 2014-08-16 ENCOUNTER — Telehealth: Payer: Self-pay | Admitting: Neurology

## 2014-08-16 ENCOUNTER — Encounter: Payer: Self-pay | Admitting: Obstetrics and Gynecology

## 2014-08-16 LAB — CNS IGG SYNTHESIS RATE, CSF+BLOOD
ALBUMIN, SERUM(NEPH): 4 g/dL (ref 3.5–4.9)
Albumin, CSF: 17.6 mg/dL (ref 8.0–42.0)
IGG, CSF: 2 mg/dL (ref 0.8–7.7)
IgG Index, CSF: 0.52 (ref <0.66)
IgG, Serum: 868 mg/dL (ref 694–1618)
MS CNS IgG Synthesis Rate: -1.9 mg/24 h (ref <=3.3)

## 2014-08-16 LAB — IMMUNOPHENOTYPING BY FLOW CYTOMETRY

## 2014-08-16 NOTE — Telephone Encounter (Addendum)
Called patient with results of CSF and serum testing thus far which has returned normal.  CSF IgG index, OCB, MPB, Lyme and HSV is still pending.  CSF  R 0 W2 G50 P33, ACE  VZV not detected, CSF cytology negative, cultures negative to date  Serum:  c/p-ANCA neg, MPO antibody neg, proteinase-3 antibody neg, SPEP/UPEP with IFE no M protein, Hepatitis acute panel neg, TSH 0.925, vitamin B12 733, ceruloplasmin 29, CRP <0.5, RF <10, ANA neg, ENA neg, copper 129, cryoglobulin negative, selenium 130  Discuss that CSF and serology testing is returning normal thus far.  CSF inflammatory markers are still pending.  Based on her work-up thus far, she has evidence of bilateral common perononeal mononeuropathy without involvement of other nerves.  In the absence of albuminocytologic dissociation, vasculitis neuropathy such as mononeuritis multiplex needs to be consider, however her inflammatory/autoimmune markers are all returning normal.  Next step would be additional blood testing (heavy metal screen, HIV, vitamin B1, zinc, vitamin B6, and vitamin E), muscle and nerve biopsy, or referral to an academic center for second opinion.  She is in favor of seeking a second opinion so will send referral to [facility].  Krystyne Tewksbury K. Posey Pronto, DO

## 2014-08-16 NOTE — Telephone Encounter (Signed)
Called patient in response to [employer].  Patient states she has started second cycle since procedure with Dr. Quincy Simmonds 05/30/14, hysteroscopy, attempted Novasure, resectosopic ablation on 05/30/14.   Patient started cycle 12/21 or 12/22, she is not sure. Patient reports that she has been having a "heavy" cycle since she started. Reports today, using super tampons and changing tampon q 4-6 hours. Patient does not have any pain. Patient reports sometimes she feels dizzy but this is usually in the afternoons and resolves.  Patient instructed to continue with monitoring bleeding, keep bleeding calendar as instructed by Dr. Quincy Simmonds.  Advised patient tocall back or seek immediate medical care if bleeding worsens or soaking through 1 pad/tampon per hour for two hours or if becomes symptomatic with sob, chest pain, fatigued, lightheaded, or weakness.  Patient verbalized understanding.  Advised would send a message to Dr. Quincy Simmonds to review and advise if needs office visit or can continue to monitor symptoms at home with bleeding calendar.  Patient in agreement.

## 2014-08-17 LAB — B. BURGDORFI ANTIBODIES, CSF: LYME AB: NEGATIVE

## 2014-08-17 LAB — MYELIN BASIC PROTEIN, CSF

## 2014-08-17 LAB — OLIGOCLONAL BANDS, CSF + SERM

## 2014-08-17 NOTE — Telephone Encounter (Signed)
Called patient and informed her that her [facility] appointment is with Dr. Mellody Drown on February 10 at 3:30.  Instructed her to take disks, list of meds, insurance card and photo ID to the appointment.  Notes and reports faxed to [facility] at [phone].

## 2014-08-18 NOTE — Telephone Encounter (Signed)
CSF IgG index 0.52, no evidence of oligoclonal bands.  CSF Lyme, HSV, and myelin basic protein is still pending.

## 2014-08-19 ENCOUNTER — Encounter: Payer: Self-pay | Admitting: Neurology

## 2014-08-19 LAB — HSV(HERPES SMPLX VRS)ABS-I+II(IGG)-CSF

## 2014-08-22 ENCOUNTER — Ambulatory Visit (INDEPENDENT_AMBULATORY_CARE_PROVIDER_SITE_OTHER): Payer: BLUE CROSS/BLUE SHIELD | Admitting: Neurology

## 2014-08-22 ENCOUNTER — Encounter: Payer: Self-pay | Admitting: Neurology

## 2014-08-22 VITALS — BP 100/74 | HR 68 | Ht 68.0 in | Wt 175.6 lb

## 2014-08-22 DIAGNOSIS — G8314 Monoplegia of lower limb affecting left nondominant side: Secondary | ICD-10-CM

## 2014-08-22 DIAGNOSIS — R209 Unspecified disturbances of skin sensation: Secondary | ICD-10-CM

## 2014-08-22 DIAGNOSIS — R202 Paresthesia of skin: Secondary | ICD-10-CM

## 2014-08-22 DIAGNOSIS — G5732 Lesion of lateral popliteal nerve, left lower limb: Secondary | ICD-10-CM

## 2014-08-22 DIAGNOSIS — R269 Unspecified abnormalities of gait and mobility: Secondary | ICD-10-CM

## 2014-08-22 DIAGNOSIS — M21372 Foot drop, left foot: Secondary | ICD-10-CM

## 2014-08-22 DIAGNOSIS — R292 Abnormal reflex: Secondary | ICD-10-CM

## 2014-08-22 DIAGNOSIS — G5730 Lesion of lateral popliteal nerve, unspecified lower limb: Secondary | ICD-10-CM

## 2014-08-22 LAB — RFX HSV/VARICELLA ZOSTER RAPID CULT

## 2014-08-22 NOTE — Patient Instructions (Addendum)
1.  Check blood work  2.  Start physical therapy for left foot drop- [facility]-[address] [place] [address].  Return to clinic in 35-months

## 2014-08-22 NOTE — Progress Notes (Signed)
sent 

## 2014-08-22 NOTE — Progress Notes (Signed)
Follow-up Visit   Date: 08/22/2014    [name] MRN: [id number] DOB: 10/07/1970   Interim History: [name] is a 44 y.o. right-handed Caucasian female with depression/anxiety and prior obesity s/p gastric bypass (March 2015, lost 120lb+), and arthritis of the knees returning to the clinic for follow-up of left foot drop and generalized paresthesias.  The patient was accompanied to the clinic by self.  History of present illness: Around late September 2015, she developed numbness of her 4th toe which progressed to involve the dorsum of the foot over a few weeks.  Soon, she noticed tingling/numbness over the left lateral lower leg.  Early December, she developed similar numbness/tingling over the right leg (toes, dorsum of the foot, and lateral leg).  Within a week, left lateral thigh started feeling different, she developed burning pain of her left > right hand and forearm, and noted numbness of the nose and lips.  She is having greater difficulty with walking due to left foot weakness, as if she is tripping over her own toes and is falling about once every other day.  She is able to get up by herself and has not sustained traumatic injuries, but had bruised herself quite a bit.  There is no preceding lightheadedness, changes in vision, or palpitations. She has been having a lot of cramp and muscle spasms in her left lower leg.  She endorses dry eyes and dry mouth. She denies any bowel/bladder incontinence, drooling, difficulty swallowing.  Her taste has also been more bland lately.  She was diabetic from 2011-2015 with HbA1c ranging ~8.4% but this resolved after her weight loss surgery.    She has been seeing Dr. Tamala Julian with Sports Medicine for ongoing problems with her knees and during his evaluation in early December, she had raised these new symptoms to him and her exam showed new left foot drop.  MRI of the lumbar spine wo contrast was ordered and did not show any nerve root  impingement. She was given a trial of prednisone taper over one week, and she denies any significant change.  UPDATE 08/22/2014:  Patient is here to discuss the results of testing to-date, including EMG, CSF, imaging, and serology testing.  Notable findings include subacute bilateral common peroneal mononeuropathy.  Serology testing and CSF has been normal.    She feels that her hands started burning and tingling which was severe a few days ago.  She is falling less but is stumbling more frequently.  Her legs are stable, without any worsening.  She continues to have numbness of the face especially nose.    Medications:  Current Outpatient Prescriptions on File Prior to Visit  Medication Sig Dispense Refill  . acetaminophen (TYLENOL) 500 MG tablet Take 1,000 mg by mouth every 6 (six) hours as needed for mild pain.    Marland Kitchen alprazolam (XANAX) 2 MG tablet Take 1-2 mg by mouth 2 (two) times daily as needed for anxiety.     . Biotin 1000 MCG tablet Take 1,000 mcg by mouth 3 (three) times daily.    . Calcium Citrate (CAL-CITRATE PO) Take by mouth.    . Cyanocobalamin-Salcaprozate (ELIGEN B12) 1000-100 MCG-MG TABS Take 1 tablet by mouth daily. 90 tablet 0  . Diclofenac Sodium (PENNSAID) 2 % SOLN Place 2 application onto the skin 2 (two) times daily. 112 g 3  . DULoxetine (CYMBALTA) 60 MG capsule Take 120 mg by mouth every morning.     Marland Kitchen FOLIC ACID PO Take by mouth.    Marland Kitchen  mometasone (ELOCON) 0.1 % ointment     . Multiple Vitamin (MULTIVITAMIN) tablet Take 1 tablet by mouth daily.    . ondansetron (ZOFRAN) 4 MG tablet TAKE 1 TABLET (4 MG TOTAL) BY MOUTH EVERY 8 (EIGHT) HOURS AS NEEDED FOR NAUSEA OR VOMITING. 9 tablet 0  . zolpidem (AMBIEN) 10 MG tablet Take 10 mg by mouth at bedtime.      No current facility-administered medications on file prior to visit.    Allergies:  Allergies  Allergen Reactions  . Sulfa Antibiotics Other (See Comments)    HALLUCINATIONS, FEVER, CHILLS  . Bactrim  [Sulfamethoxazole-Trimethoprim] Other (See Comments)    Hallucinations, fever chills    Review of Systems:  CONSTITUTIONAL: No fevers, chills, night sweats, + 120lb weight loss.  EYES: No visual changes or eye pain ENT: No hearing changes.  No history of nose bleeds.   RESPIRATORY: No cough, wheezing and shortness of breath.   CARDIOVASCULAR: Negative for chest pain, and palpitations.   GI: Negative for abdominal discomfort, blood in stools or black stools.  No recent change in bowel habits.   GU:  No history of incontinence.   MUSCLOSKELETAL: No history of joint pain or swelling.  No myalgias.   SKIN: Negative for lesions, rash, and itching.   ENDOCRINE: Negative for cold or heat intolerance, polydipsia or goiter.   PSYCH:  No depression or anxiety symptoms.   NEURO: As Above.   Vital Signs:  BP 100/74 mmHg  Pulse 68  Ht 5' 8" (1.727 m)  Wt 175 lb 9.6 oz (79.652 kg)  BMI 26.71 kg/m2  SpO2 99%  LMP 06/15/2014 (Exact Date)  Neurological Exam: MENTAL STATUS including orientation to time, place, person, recent and remote memory, attention span and concentration, language, and fund of knowledge is normal.  Speech is not dysarthric.  CRANIAL NERVES: No visual field defects.  Pupils equal round and reactive to light.  Normal conjugate, extra-ocular eye movements in all directions of gaze.  No ptosis. Normal facial sensation.  Face is symmetric. Palate elevates symmetrically.  Tongue is midline.   MOTOR:  No atrophy, fasciculations or abnormal movements.  No pronator drift.  Tone is normal.    Right Upper Extremity:    Left Upper Extremity:    Deltoid  5/5   Deltoid  5/5   Biceps  5/5   Biceps  5/5   Triceps  5/5   Triceps  5/5   Wrist extensors  5/5   Wrist extensors  5/5   Wrist flexors  5/5   Wrist flexors  5/5   Finger extensors  5/5   Finger extensors  5/5   Finger flexors  5/5   Finger flexors  5/5   Dorsal interossei  5/5   Dorsal interossei  5/5   Abductor pollicis  5/5    Abductor pollicis  5/5   Tone (Ashworth scale)  0  Tone (Ashworth scale)  0   Right Lower Extremity:    Left Lower Extremity:    Hip flexors  5/5   Hip flexors  5/5   Hip extensors  5/5   Hip extensors  5/5   Knee flexors  5/5   Knee flexors  5/5   Knee extensors  5/5   Knee extensors  5/5   Inversion 5/5  Inversion* 5/5  Eversion 5/5  Eversion* 3/5  Dorsiflexors  5/5   Dorsiflexors  3/5   Plantarflexors  5/5   Plantarflexors  5/5   Toe extensors  5/5  Toe extensors  3+/5   Toe flexors  5/5   Toe flexors  5/5   Tone (Ashworth scale)  0  Tone (Ashworth scale)  0   MSRs:  Right                                                                 Left brachioradialis 2+  brachioradialis 2+  biceps 2+  biceps 2+  triceps 2+  triceps 2+  patellar 3+  patellar 3+  ankle jerk 2+  ankle jerk 2+  Hoffman no  Hoffman no  plantar response down  plantar response down   SENSORY:  Reduced sensation to all modalities over the left >> right lateral lower leg and dorsum of the foot.  COORDINATION/GAIT:  Normal finger-to- nose-finger.  High steppage gait on the left.  Gait appears stable.  Data: MRI brain wwo contrast 07/29/2014:  Several punctate foci of white matter T2 signal abnormality, nonspecific and not necessarily greater than expected for patient's age. These could reflect minimal chronic small vessel ischemia or sequelae of migraines, among other etiologies. No characteristics specifically suggestive of demyelinating disease are identified.  MRI cervical spine wwo contrast 07/29/2014:  Negative for demyelinating disease. The cervical cord is normal in appearance. Shallow disc bulge C6-7 without central canal or foraminal narrowing.  MRI lumbar spine wo contrast 07/22/2014: Minor lumbar spondylosis. No compressive lesion. No intraspinal abnormality.  EMG 07/28/2014 left arm and bilateral lower extremities: 1. There is electrophysiologic evidence of an asymmetric acute common peroneal  neuropathy at the fibular head affecting bilateral lower extremities, predominately demyelinating with secondary axon loss. These findings are severe in degree electrically on the left, and mild on the right. 2. A superimposed L5 radiculopathy cannot be excluded.  CSF  R 0 W2 G50 P33  IgG index 0.52, no OCB, ACE 8 VZV not detected, CSF cytology negative, cultures negative to date CSF Lyme, HSV, and myelin basic protein is still pending.  Labs 07/25/2014:  ANA negative, ACE 21, CK 30, TSH 1.75, ESR 18  Serum 08/02/2014:  c/p-ANCA neg, MPO antibody neg, proteinase-3 antibody neg, SPEP/UPEP with IFE no M protein, Hepatitis acute panel neg, TSH 0.925, vitamin B12 733, ceruloplasmin 29, CRP <0.5, RF <10, ANA neg, ENA neg, copper 129, cryoglobulin negative, selenium 130   IMPRESSION: [name] is a 44 year-old female returning for evaluation of gradually progressive paresthesias (legs > arms > face) and left foot drop.Her exam is consistent with left >> right peroneal mononeuropathy, which is also evidenced by her EMG.  Since her last visit, there is improvement of toe extension and eversion on the left.  She had underwent extensive work-up including MRI brain, c-spine, lumbar spine, CSF testing, EMG, and serology testing.  Based on her work-up thus far, she has evidence of bilateral common perononeal mononeuropathy without involvement of other nerves electrodiagnostically.  In the absence of albuminocytologic dissociation or inflammatory changes involving the CSF, vasculitic neuropathy such as mononeuritis multiplex needs to be considered; however, her inflammatory/autoimmune markers are all returning normal which is atypical for inflammatory vasculitis.  Amyloid is another possibility.  Next step would be additional blood testing (HbA1c, heavy metal screen, HIV, vitamin B1, zinc, vitamin B6, and vitamin E), muscle and nerve biopsy, or referral to an academic  center for second opinion.  She is in favor of  seeking a second opinion so will send referral to [facility].   Further, history of significant weight loss needs to be considered as normal fat pad protecting the nerve may be compromised, however this would not explain her upper extremity and facial paresthesias.  NCS/EMG in December did not show any neuropathy affecting the left upper extremity.  If hand paresthesias worsen, may need to repeat this.    PLAN/RECOMMENDATIONS:  1.  Check HbA1c, heavy metal screen, HIV, vitamin B1, zinc, vitamin B6, and vitamin E 2.  Start PT for left foot drop 3.  Neurontin offered for hand paresthesias, but patient does not wish to start any medication 4.  Referral to Neuromuscular Department at [facility] for second opinion 5.  Return to clinic in 83-month, or sooner as needed  The duration of this appointment visit was 40 minutes of face-to-face time with the patient.  Greater than 50% of this time was spent in counseling, explanation of diagnosis, planning of further management, and coordination of care.   Thank you for allowing me to participate in patient's care.  If I can answer any additional questions, I would be pleased to do so.    Sincerely,    Calen Posch K. PPosey Pronto DO

## 2014-08-23 LAB — HIV ANTIBODY (ROUTINE TESTING W REFLEX): HIV 1&2 Ab, 4th Generation: NONREACTIVE

## 2014-08-24 LAB — LEAD, BLOOD: Lead-Whole Blood: <2 ug/dL (ref <10)

## 2014-08-25 LAB — VITAMIN E
GAMMA-TOCOPHEROL (VIT E): 0.6 mg/L (ref <=4.3)
VITAMIN E (ALPHA TOCOPHEROL): 11.8 mg/L (ref 5.7–19.9)

## 2014-08-25 LAB — ZINC: Zinc: 44 ug/dL — ABNORMAL LOW (ref 60–130)

## 2014-08-25 LAB — VITAMIN B6: VITAMIN B6: 18.8 ng/mL (ref 2.1–21.7)

## 2014-08-25 MED ORDER — GABAPENTIN 100 MG PO CAPS: 100.0000 mg | ORAL_CAPSULE | Freq: Two times a day (BID) | ORAL | Status: DC

## 2014-08-27 ENCOUNTER — Other Ambulatory Visit: Payer: Self-pay | Admitting: Family Medicine

## 2014-08-29 ENCOUNTER — Ambulatory Visit: Payer: BC Managed Care – PPO | Admitting: Family Medicine

## 2014-08-29 NOTE — Telephone Encounter (Signed)
Refill done.  

## 2014-08-31 ENCOUNTER — Telehealth: Payer: Self-pay | Admitting: *Deleted

## 2014-08-31 LAB — VITAMIN B1

## 2014-08-31 NOTE — Telephone Encounter (Signed)
[facility] lab called to let us know that they were unable to do the B1 lab because they would have gotten inconclusive results.

## 2014-09-09 ENCOUNTER — Ambulatory Visit: Payer: BC Managed Care – PPO | Admitting: Neurology

## 2014-09-21 ENCOUNTER — Ambulatory Visit: Payer: BC Managed Care – PPO | Admitting: Dietician

## 2014-09-22 ENCOUNTER — Encounter: Payer: Self-pay | Admitting: Family Medicine

## 2014-10-01 ENCOUNTER — Other Ambulatory Visit: Payer: Self-pay | Admitting: Family Medicine

## 2014-10-03 ENCOUNTER — Ambulatory Visit (INDEPENDENT_AMBULATORY_CARE_PROVIDER_SITE_OTHER): Payer: BLUE CROSS/BLUE SHIELD | Admitting: Family Medicine

## 2014-10-03 ENCOUNTER — Encounter: Payer: Self-pay | Admitting: Family Medicine

## 2014-10-03 VITALS — BP 104/68 | HR 73 | Ht 68.0 in | Wt 172.0 lb

## 2014-10-03 DIAGNOSIS — M1711 Unilateral primary osteoarthritis, right knee: Secondary | ICD-10-CM

## 2014-10-03 DIAGNOSIS — M170 Bilateral primary osteoarthritis of knee: Secondary | ICD-10-CM | POA: Diagnosis not present

## 2014-10-03 DIAGNOSIS — M1710 Unilateral primary osteoarthritis, unspecified knee: Secondary | ICD-10-CM

## 2014-10-03 NOTE — Progress Notes (Signed)
CC: Bilateral knee pain  HPI: patient does have moderate osteophytic changes of the knees bilaterally. Patient did have stomach bypass surgery recently and has lost significant amount weight. Patient is also being seen by [facility] for further workup of a polyneuropathy with weakness of the lower extremity.  Patient does have known osteophytic changes of the knees and there is a questionable of a loose body in the right knee. Patient was last seen for this problem in November and was given bilateral steroid injections. Patient did finish a series of Synvisc injections back in June. Patient states partially she continues to have bilateral knee pain. Patient was given steroid injection 3 months ago with no significant improvement. Patient would've gone forward with a Synvisc injection but had to have workup for the peripheral neuropathy. Patient states that the peripheral neuropathy seems to be improving somewhat. Has not worsened over the course of the last couple months. Patient states though that she has even decreased her home exercises and other exercises which is causing more discomfort in the knees bilaterally.   Past medical history, Surgical history, Family history not pertinant except as noted below, Social history, Allergies, and medications have been entered into the medical record, reviewed, and no changes needed.   Review of Systems: No fevers, chills, night sweats, weight loss, chest pain, or shortness of breath.   Objective:   Blood pressure 104/68, pulse 73, height 5\' 8"  (1.727 m), weight 172 lb (78.019 kg), SpO2 93 %.  General: Well Developed, well nourished, and in no acute distress. Obese Neuro: Alert and oriented x3, extra-ocular muscles intact, sensation grossly intact.  HEENT: Normocephalic, atraumatic, pupils equal round reactive to light, neck supple, no masses, no lymphadenopathy, thyroid nonpalpable.  Skin: Warm and dry, no rashes. Cardiac:  no lower extremity  edema. Respiratory: Not using accessory muscles, speaking in full sentences. Abdominal: NT, soft Gait: Nonantlagic, good balance and coordination Lymphatic: no lymphadenopathy in neck or axillae on palpation, non tender.  Musculoskeletal: Inspection and palpation of the right and left upper extremities including the shoulders elbows and wrist are unremarkable with full range of motion and good muscle strength and tone. Inspection and palpation of the right and left lower extremities including the hips  and ankles are unremarkable and nontender with full range of motion and good muscle strength and tone and are symmetric. Knee: bilateral No swelling noted today.Marland Kitchen  Positive medial joint line tenderness, and patellar tenderness, but no condyle tenderness. ROM full in flexion and extension and lower leg rotation. Ligaments with solid consistent endpoints including ACL, PCL, LCL, MCL. Positive Mcmurray's, Apley's, and Thessalonian tests. Painful patellar compression. Patellar glide with significant crepitus. Patellar and quadriceps tendons unremarkable but does have some weakness of the quadriceps on the right side compared to the contralateral side Hamstring strength is normal .  After informed written and verbal consent, patient was seated on exam table. Right knee was prepped with alcohol swab and utilizing anterolateral approach, patient's right knee space was injected with 16 mg/2.5 mL of Synvisc (sodium hyaluronate) in a prefilled syringe was injected easily into the knee through a 22-gauge needle.. Patient tolerated the procedure well without immediate complications.  After informed written and verbal consent, patient was seated on exam table. Left knee was prepped with alcohol swab and utilizing anterolateral approach, patient's left knee space was injected with 16 mg/2.5 mL of Synvisc (sodium hyaluronate) in a prefilled syringe was injected easily into the knee through a 22-gauge needle..  Patient tolerated  the procedure well without immediate complications.

## 2014-10-03 NOTE — Progress Notes (Signed)
Pre visit review using our clinic review tool, if applicable. No additional management support is needed unless otherwise documented below in the visit note. 

## 2014-10-03 NOTE — Assessment & Plan Note (Signed)
Started Synvisc injections again, discussed icing, discussed bracing and topical medicine.  Will come back in 1 week.

## 2014-10-03 NOTE — Patient Instructions (Addendum)
Good to see you as always.  Ice is your friend.  Continue the exercises See me again in 1 week and we will do injections.

## 2014-10-03 NOTE — Telephone Encounter (Signed)
Refill done.  

## 2014-10-04 ENCOUNTER — Encounter: Payer: BLUE CROSS/BLUE SHIELD | Attending: General Surgery | Admitting: Dietician

## 2014-10-04 VITALS — Ht 68.0 in | Wt 170.0 lb

## 2014-10-04 DIAGNOSIS — E669 Obesity, unspecified: Secondary | ICD-10-CM | POA: Insufficient documentation

## 2014-10-04 DIAGNOSIS — Z713 Dietary counseling and surveillance: Secondary | ICD-10-CM | POA: Insufficient documentation

## 2014-10-04 DIAGNOSIS — Z6825 Body mass index (BMI) 25.0-25.9, adult: Secondary | ICD-10-CM | POA: Diagnosis not present

## 2014-10-04 DIAGNOSIS — Z9884 Bariatric surgery status: Secondary | ICD-10-CM | POA: Insufficient documentation

## 2014-10-04 NOTE — Progress Notes (Signed)
  Follow-up visit:  11 Months Post-Operative RYGB Surgery  Medical Nutrition Therapy:  Appt start time: 820 end time:  840.  Primary concerns today: Post-operative Bariatric Surgery Nutrition Management. Returns with an 18 lbs weight loss. Has not been able to exercise as much d/t some health problems. Does not want weight to go any lower than it is now.  Still not able to tolerate chicken.  Able to eat a little bit of pork and red meat. Lunch meat and fish is ok.   Nausea is better than it has been (once in the past few weeks when she ate chicken). Getting more fluid and protein in.  Surgery date: 11/02/2013  Surgery type: RYGB  Start weight at [employer]: 291 on 05/24/13  Weight today: 170.0 lbs  lbs    Weight change: 18 lbs Total weight loss: 84 lbs  Weight loss goal: 185 lbs (does not want to go lower than 170 lbs)  [facility]ANITA BODY COMP RESULTS   10/28/2013  11/16/13  01/17/14 02/23/14 06/21/14 10/04/14  BMI (kg/m^2)  42.4  40.4  35.7 34.1 28.6 25.8  Fat Mass (lbs)  151  140.5  106.5 96.0 68.5 57.5  Fat Free Mass (lbs)  128  125.0  128.0 128.0 119.5 112.5  Total Body Water (lbs)  93.5  91.5  93.5 93.5 87.5 82.5    Preferred Learning Style:   No preference indicated   Learning Readiness:   Ready  24-hr recall: B (AM): 1/2 premier shake and protein bar (25g) Snk ( AM): nuts (6 g) L (PM): yogurt with celery and peanut butter  (18 g) Snk ( PM): fruit D (PM): salad and 3 oz protein (21 g)  Snk (PM): sugar free popsicle or coffee with SF creamer   Fluid intake: 32 oz decaf coffee, 16 oz vitamin water zero or 16 oz crystal light  64 oz  Estimated total protein intake: 70 g  Medications: see list Supplementation: taking  Using straws: Yes Drinking while eating: No Hair loss: Yes (not stopping, starting taking Biotin) Carbonated beverages: No N/V/D/C: some nausea more rarely Dumping syndrome: No  Recent physical activity: has not been able to exercise since December  Progress Towards  Goal(s):  In progress   Nutritional Diagnosis:  [place]-3.3 Overweight/obesity related to past poor dietary habits and physical inactivity as evidenced by patient w/ recent RYGB surgery following dietary guidelines for continued weight loss.    Intervention:  Nutrition education/diet reinforcement.  Goals:  Follow Phase 3B: High Protein + Non-Starchy Vegetables  Eat 3-6 small meals/snacks, every 3-5 hrs  Increase lean protein foods to meet 60g goal  Increase fluid intake to 64oz +  Avoid drinking 15 minutes before, during and 30 minutes after eating  Aim for >30 min of physical activity daily  Add peanut butter (or any nut butter) for snacks  Eat protein foods first, then add carbs if you are still hungry  Look to add about 15 g of carbohydrates to meals as you like  Handouts given out during appointment:   Yellow card  Teaching Method Utilized:  Visual Auditory Hands on  Barriers to learning/adherence to lifestyle change: hx of nausea, difficulty tolerating protein foods  Demonstrated degree of understanding via:  Teach Back   Monitoring/Evaluation:  Dietary intake, exercise, and body weight. Follow up in 6 months for 17 month post-op visit.

## 2014-10-04 NOTE — Patient Instructions (Addendum)
Goals:  Follow Phase 3B: High Protein + Non-Starchy Vegetables  Eat 3-6 small meals/snacks, every 3-5 hrs  Increase lean protein foods to meet 60g goal  Increase fluid intake to 64oz +  Avoid drinking 15 minutes before, during and 30 minutes after eating  Aim for >30 min of physical activity daily  Add peanut butter (or any nut butter) for snacks  Eat protein foods first, then add carbs if you are still hungry  Look to add about 15 g of carbohydrates to meals as you like

## 2014-10-06 LAB — HM DIABETES EYE EXAM

## 2014-10-07 ENCOUNTER — Encounter: Payer: Self-pay | Admitting: Family Medicine

## 2014-10-10 ENCOUNTER — Ambulatory Visit (INDEPENDENT_AMBULATORY_CARE_PROVIDER_SITE_OTHER): Payer: BLUE CROSS/BLUE SHIELD | Admitting: Family Medicine

## 2014-10-10 ENCOUNTER — Encounter: Payer: Self-pay | Admitting: Family Medicine

## 2014-10-10 VITALS — BP 112/70 | HR 64 | Ht 68.0 in | Wt 172.0 lb

## 2014-10-10 DIAGNOSIS — M1710 Unilateral primary osteoarthritis, unspecified knee: Secondary | ICD-10-CM

## 2014-10-10 NOTE — Progress Notes (Signed)
CC: Bilateral knee pain  HPI: patient does have moderate osteophytic changes of the knees bilaterally. Patient did have stomach bypass surgery recently and has lost significant amount weight. Patient is also being seen by [facility] for further workup of a polyneuropathy with weakness of the lower extremity.  Patient does have known osteophytic changes of the knees and there is a questionable of a loose body in the right knee. Patient was last seen for this problem in November and was given bilateral steroid injections. Patient did finish a series of Synvisc injections back in June. We started another series last week. Patient is here for second of a series of 4 injections.        Past medical history, Surgical history, Family history not pertinant except as noted below, Social history, Allergies, and medications have been entered into the medical record, reviewed, and no changes needed.   Review of Systems: No fevers, chills, night sweats, weight loss, chest pain, or shortness of breath.   Objective:   Blood pressure 112/70, pulse 64, height 5\' 8"  (1.727 m), weight 172 lb (78.019 kg), SpO2 99 %.  General: Well Developed, well nourished, and in no acute distress. Obese Neuro: Alert and oriented x3, extra-ocular muscles intact, sensation grossly intact.  HEENT: Normocephalic, atraumatic, pupils equal round reactive to light, neck supple, no masses, no lymphadenopathy, thyroid nonpalpable.  Skin: Warm and dry, no rashes. Cardiac:  no lower extremity edema. Respiratory: Not using accessory muscles, speaking in full sentences. Abdominal: NT, soft Gait: Nonantlagic, good balance and coordination Lymphatic: no lymphadenopathy in neck or axillae on palpation, non tender.  Musculoskeletal: Inspection and palpation of the right and left upper extremities including the shoulders elbows and wrist are unremarkable with full range of motion and good muscle strength and tone. Inspection  and palpation of the right and left lower extremities including the hips  and ankles are unremarkable and nontender with full range of motion and good muscle strength and tone and are symmetric. Knee: bilateral No swelling noted today.Marland Kitchen  Positive medial joint line tenderness, and patellar tenderness, but no condyle tenderness. ROM full in flexion and extension and lower leg rotation. Ligaments with solid consistent endpoints including ACL, PCL, LCL, MCL. Positive Mcmurray's, Apley's, and Thessalonian tests. Painful patellar compression. Patellar glide with significant crepitus. Patellar and quadriceps tendons unremarkable but does have some weakness of the quadriceps on the right side compared to the contralateral side Hamstring strength is normal .  After informed written and verbal consent, patient was seated on exam table. Right knee was prepped with alcohol swab and utilizing anterolateral approach, patient's right knee space was injected with 16 mg/2.5 mL of Synvisc (sodium hyaluronate) in a prefilled syringe was injected easily into the knee through a 22-gauge needle.. Patient tolerated the procedure well without immediate complications.  After informed written and verbal consent, patient was seated on exam table. Left knee was prepped with alcohol swab and utilizing anterolateral approach, patient's left knee space was injected with 16 mg/2.5 mL of Synvisc (sodium hyaluronate) in a prefilled syringe was injected easily into the knee through a 22-gauge needle.. Patient tolerated the procedure well without immediate complications.

## 2014-10-10 NOTE — Assessment & Plan Note (Addendum)
Patient given bilateral injections again today. Patient tolerated the procedure very well. Patient is already noticed some improvement on the right side but not as much on the left side. Time hoping that patient will continue to make significant improvement. Patient come back and see me again in 1 week for third of the series of 4 injections.

## 2014-10-10 NOTE — Progress Notes (Signed)
Pre visit review using our clinic review tool, if applicable. No additional management support is needed unless otherwise documented below in the visit note. 

## 2014-10-10 NOTE — Patient Instructions (Signed)
Good to see you Ice in 6 hours continue what you are doing See you next weeks.

## 2014-10-17 ENCOUNTER — Other Ambulatory Visit: Payer: Self-pay | Admitting: *Deleted

## 2014-10-17 ENCOUNTER — Other Ambulatory Visit: Payer: BLUE CROSS/BLUE SHIELD

## 2014-10-17 ENCOUNTER — Encounter: Payer: Self-pay | Admitting: Family Medicine

## 2014-10-17 ENCOUNTER — Ambulatory Visit (INDEPENDENT_AMBULATORY_CARE_PROVIDER_SITE_OTHER): Payer: BLUE CROSS/BLUE SHIELD | Admitting: Family Medicine

## 2014-10-17 VITALS — BP 108/72 | HR 54 | Ht 68.0 in | Wt 172.0 lb

## 2014-10-17 DIAGNOSIS — M25562 Pain in left knee: Secondary | ICD-10-CM

## 2014-10-17 DIAGNOSIS — M1711 Unilateral primary osteoarthritis, right knee: Secondary | ICD-10-CM

## 2014-10-17 DIAGNOSIS — M1710 Unilateral primary osteoarthritis, unspecified knee: Secondary | ICD-10-CM

## 2014-10-17 DIAGNOSIS — M1712 Unilateral primary osteoarthritis, left knee: Secondary | ICD-10-CM

## 2014-10-17 NOTE — Patient Instructions (Signed)
Good to see you Iec is your friend Get a  Break from me for some time Call me though in 2 weeks if left knee is still swelling, will need to consider mri

## 2014-10-17 NOTE — Progress Notes (Signed)
Pre visit review using our clinic review tool, if applicable. No additional management support is needed unless otherwise documented below in the visit note. 

## 2014-10-17 NOTE — Progress Notes (Signed)
CC: Bilateral knee pain  HPI: patient does have moderate osteophytic changes of the knees bilaterally. Patient did have stomach bypass surgery recently and has lost significant amount weight. Patient is also being seen by [facility] for further workup of a polyneuropathy with weakness of the lower extremity.  Patient does have known osteophytic changes of the knees and there is a questionable of a loose body in the right knee. Patient was last seen for this problem in November and was given bilateral steroid injections. Patient did finish a series of Synvisc injections back in June. Patient is here for third and final Synvisc injection..        Past medical history, Surgical history, Family history not pertinant except as noted below, Social history, Allergies, and medications have been entered into the medical record, reviewed, and no changes needed.   Review of Systems: No fevers, chills, night sweats, weight loss, chest pain, or shortness of breath.   Objective:   Blood pressure 108/72, pulse 54, height 5\' 8"  (1.727 m), weight 172 lb (78.019 kg), SpO2 99 %.  General: Well Developed, well nourished, and in no acute distress. Obese Neuro: Alert and oriented x3, extra-ocular muscles intact, sensation grossly intact.  HEENT: Normocephalic, atraumatic, pupils equal round reactive to light, neck supple, no masses, no lymphadenopathy, thyroid nonpalpable.  Skin: Warm and dry, no rashes. Cardiac:  no lower extremity edema. Respiratory: Not using accessory muscles, speaking in full sentences. Abdominal: NT, soft Gait: Nonantlagic, good balance and coordination Lymphatic: no lymphadenopathy in neck or axillae on palpation, non tender.  Musculoskeletal: Inspection and palpation of the right and left upper extremities including the shoulders elbows and wrist are unremarkable with full range of motion and good muscle strength and tone. Inspection and palpation of the right and left  lower extremities including the hips  and ankles are unremarkable and nontender with full range of motion and good muscle strength and tone and are symmetric. Knee: bilateral No swelling noted today.Marland Kitchen  Positive medial joint line tenderness, and patellar tenderness, but no condyle tenderness. ROM full in flexion and extension and lower leg rotation. Ligaments with solid consistent endpoints including ACL, PCL, LCL, MCL. Positive Mcmurray's, Apley's, and Thessalonian tests. Painful patellar compression. Patellar glide with significant crepitus. Patellar and quadriceps tendons unremarkable but does have some weakness of the quadriceps on the right side compared to the contralateral side Hamstring strength is normal .  After informed written and verbal consent, patient was seated on exam table. Right knee was prepped with alcohol swab and utilizing anterolateral approach, patient's right knee space was injected with 16 mg/2.5 mL of Synvisc (sodium hyaluronate) in a prefilled syringe was injected easily into the knee through a 22-gauge needle.. Patient tolerated the procedure well without immediate complications.  After informed written and verbal consent, patient was seated on exam table. Left knee was prepped with alcohol swab and utilizing anterolateral approach, patient's left knee space was injected with 16 mg/2.5 mL of Synvisc (sodium hyaluronate) in a prefilled syringe was injected easily into the knee through a 22-gauge needle.. Patient tolerated the procedure well without immediate complications. Aspirated approximately 30 mL of fluid from left knee today. Strawlike colored.

## 2014-10-17 NOTE — Assessment & Plan Note (Addendum)
Patient was given injections again. Patient tolerated the procedure very well. Patient did have significant amount of effusion of the left knee. I think this is likely just from the arthritis. We may send the fluid down though for uric acid crystals. Patient and will come back and see me again in 1 month. Patient having worsening symptoms we will need to do an MRI of the left knee to rule out any intra-articular pathology.

## 2014-10-18 LAB — SYNOVIAL CELL COUNT + DIFF, W/ CRYSTALS
Crystals, Fluid: NONE SEEN
EOSINOPHILS-SYNOVIAL: 0 % (ref 0–1)
Lymphocytes-Synovial Fld: 38 % — ABNORMAL HIGH (ref 0–20)
Monocyte/Macrophage: 10 % — ABNORMAL LOW (ref 50–90)
NEUTROPHIL, SYNOVIAL: 52 % — AB (ref 0–25)
WBC, SYNOVIAL: 875 uL — AB (ref 0–200)

## 2014-10-21 ENCOUNTER — Ambulatory Visit (INDEPENDENT_AMBULATORY_CARE_PROVIDER_SITE_OTHER): Payer: BLUE CROSS/BLUE SHIELD | Admitting: Neurology

## 2014-10-21 ENCOUNTER — Encounter: Payer: Self-pay | Admitting: Neurology

## 2014-10-21 VITALS — BP 138/70 | HR 70 | Resp 20 | Ht 68.0 in | Wt 170.2 lb

## 2014-10-21 DIAGNOSIS — G5732 Lesion of lateral popliteal nerve, left lower limb: Secondary | ICD-10-CM

## 2014-10-21 DIAGNOSIS — G8314 Monoplegia of lower limb affecting left nondominant side: Secondary | ICD-10-CM

## 2014-10-21 DIAGNOSIS — R202 Paresthesia of skin: Secondary | ICD-10-CM

## 2014-10-21 NOTE — Patient Instructions (Addendum)
1.  Start taking gabapentin 100mg  at bedtime for one week, then increase to 1 tablet twice daily 2.  Check zinc level 3.  Continue home exercises  4.  Return to clinic in 3 months

## 2014-10-21 NOTE — Progress Notes (Signed)
Follow-up Visit   Date: 10/21/2014    Berdia Lachman MRN: [id number] DOB: 25-Jun-1971   Interim History: Tkeyah Burkman is a 44 y.o. right-handed Caucasian female with depression/anxiety and prior obesity s/p gastric bypass (March 2015, lost 120lb+), and arthritis of the knees returning to the clinic for follow-up of left foot drop and generalized paresthesias.  The patient was accompanied to the clinic by self.  History of present illness: Around late September 2015, she developed numbness of her 4th toe which progressed to involve the dorsum of the foot over a few weeks.  Soon, she noticed tingling/numbness over the left lateral lower leg.  Early December, she developed similar numbness/tingling over the right leg (toes, dorsum of the foot, and lateral leg).  Within a week, left lateral thigh started feeling different, she developed burning pain of her left > right hand and forearm, and noted numbness of the nose and lips.  She is having greater difficulty with walking due to left foot weakness, as if she is tripping over her own toes and is falling about once every other day.  She is able to get up by herself and has not sustained traumatic injuries, but had bruised herself quite a bit.  There is no preceding lightheadedness, changes in vision, or palpitations. She has been having a lot of cramp and muscle spasms in her left lower leg.  She endorses dry eyes and dry mouth. She denies any bowel/bladder incontinence, drooling, difficulty swallowing.  Her taste has also been more bland lately.  She was diabetic from 2011-2015 with HbA1c ranging ~8.4% but this resolved after her weight loss surgery.    She has been seeing Dr. Tamala Julian with Sports Medicine for ongoing problems with her knees and during his evaluation in early December, she had raised these new symptoms to him and her exam showed new left foot drop.  MRI of the lumbar spine wo contrast was ordered and did not show any nerve root  impingement. She was given a trial of prednisone taper over one week, and she denies any significant change.  UPDATE 08/22/2014:  Patient is here to discuss the results of testing to-date, including EMG, CSF, imaging, and serology testing.  Notable findings include subacute bilateral common peroneal mononeuropathy.  Serology testing and CSF has been normal.  She feels that her hands started burning and tingling which was severe a few days ago.  She is falling less but is stumbling more frequently.  Her legs are stable, without any worsening.  She continues to have numbness of the face especially nose.    UPDATE 10/21/2014:  Currently, she continues to have burning sensations of the hands and feet and has not tried taking gabapentin yet.  No weakness of the hands.  Her left foot weakness is slowly improving, especially with her toes.  She still has difficulty raising the left foot.  Fortunately, she has only had one fall since her last visit.   Medications:  Current Outpatient Prescriptions on File Prior to Visit  Medication Sig Dispense Refill  . acetaminophen (TYLENOL) 500 MG tablet Take 1,000 mg by mouth every 6 (six) hours as needed for mild pain.    Marland Kitchen alprazolam (XANAX) 2 MG tablet Take 1-2 mg by mouth 2 (two) times daily as needed for anxiety.     . Biotin 1000 MCG tablet Take 1,000 mcg by mouth 3 (three) times daily.    . Calcium Citrate (CAL-CITRATE PO) Take by mouth.    . Diclofenac  Sodium (PENNSAID) 2 % SOLN Place 2 application onto the skin 2 (two) times daily. 112 g 3  . DULoxetine (CYMBALTA) 60 MG capsule Take 120 mg by mouth every morning.     Marland Kitchen ELIGEN B12 1000-100 MCG-MG TABS TAKE 1 TABLET BY MOUTH DAILY. 30 tablet 11  . ELIGEN B12 1000-100 MCG-MG TABS TAKE 1 TABLET BY MOUTH DAILY. 30 tablet 6  . FOLIC ACID PO Take by mouth.    . Multiple Vitamin (MULTIVITAMIN) tablet Take 1 tablet by mouth daily.    . ondansetron (ZOFRAN) 4 MG tablet TAKE 1 TABLET (4 MG TOTAL) BY MOUTH EVERY 8 (EIGHT)  HOURS AS NEEDED FOR NAUSEA OR VOMITING. 9 tablet 0  . zolpidem (AMBIEN) 10 MG tablet Take 10 mg by mouth at bedtime.     . gabapentin (NEURONTIN) 100 MG capsule Take 1 capsule (100 mg total) by mouth 2 (two) times daily. (Patient not taking: Reported on 10/21/2014) 60 capsule 5  . mometasone (ELOCON) 0.1 % ointment      No current facility-administered medications on file prior to visit.    Allergies:  Allergies  Allergen Reactions  . Sulfa Antibiotics Other (See Comments)    HALLUCINATIONS, FEVER, CHILLS  . Bactrim [Sulfamethoxazole-Trimethoprim] Other (See Comments)    Hallucinations, fever chills    Review of Systems:  CONSTITUTIONAL: No fevers, chills, night sweats, + 120lb weight loss.  EYES: No visual changes or eye pain ENT: No hearing changes.  No history of nose bleeds.   RESPIRATORY: No cough, wheezing and shortness of breath.   CARDIOVASCULAR: Negative for chest pain, and palpitations.   GI: Negative for abdominal discomfort, blood in stools or black stools.  No recent change in bowel habits.   GU:  No history of incontinence.   MUSCLOSKELETAL: No history of joint pain or swelling.  No myalgias.   SKIN: Negative for lesions, rash, and itching.   ENDOCRINE: Negative for cold or heat intolerance, polydipsia or goiter.   PSYCH:  No depression or anxiety symptoms.   NEURO: As Above.   Vital Signs:  BP 138/70 mmHg  Pulse 70  Resp 20  Ht '5\' 8"'  (1.727 m)  Wt 170 lb 3.2 oz (77.202 kg)  BMI 25.88 kg/m2  SpO2 99%  LMP 10/21/2014  Neurological Exam: MENTAL STATUS including orientation to time, place, person, recent and remote memory, attention span and concentration, language, and fund of knowledge is normal.  Speech is not dysarthric.  CRANIAL NERVES: No visual field defects.  Pupils equal round and reactive to light.  Normal conjugate, extra-ocular eye movements in all directions of gaze.  No ptosis. Normal facial sensation.  Face is symmetric. Palate elevates  symmetrically.  Tongue is midline.   MOTOR:  No atrophy, fasciculations or abnormal movements.  No pronator drift.  Tone is normal.    Right Upper Extremity:    Left Upper Extremity:    Deltoid  5/5   Deltoid  5/5   Biceps  5/5   Biceps  5/5   Triceps  5/5   Triceps  5/5   Wrist extensors  5/5   Wrist extensors  5/5   Wrist flexors  5/5   Wrist flexors  5/5   Finger extensors  5/5   Finger extensors  5/5   Finger flexors  5/5   Finger flexors  5/5   Dorsal interossei  5/5   Dorsal interossei  5/5   Abductor pollicis  5/5   Abductor pollicis  5/5   Tone (  Ashworth scale)  0  Tone (Ashworth scale)  0   Right Lower Extremity:    Left Lower Extremity:    Hip flexors  5/5   Hip flexors  5/5   Hip extensors  5/5   Hip extensors  5/5   Knee flexors  5/5   Knee flexors  5/5   Knee extensors  5/5   Knee extensors  5/5   Inversion 5/5  Inversion* 4/5  Eversion 5/5  Eversion* 4/5  Dorsiflexors  5/5   Dorsiflexors  4/5   Plantarflexors  5/5   Plantarflexors  5/5   Toe extensors  5/5   Toe extensors  5/5   Toe flexors  5/5   Toe flexors  5/5   Tone (Ashworth scale)  0  Tone (Ashworth scale)  0   MSRs:  Right                                                                 Left brachioradialis 2+  brachioradialis 2+  biceps 2+  biceps 2+  triceps 2+  triceps 2+  patellar 3+  patellar 3+  ankle jerk 2+  ankle jerk 2+  Hoffman no  Hoffman no  plantar response down  plantar response down   SENSORY:  Reduced sensation to all modalities over the left >> right lateral lower leg and dorsum of the foot.  COORDINATION/GAIT:  Normal finger-to- nose-finger. Gait appears stable.  Able to walk on toes, difficulty left heel walking  Data: MRI brain wwo contrast 07/29/2014:  Several punctate foci of white matter T2 signal abnormality, nonspecific and not necessarily greater than expected for patient's age. These could reflect minimal chronic small vessel ischemia or sequelae of migraines, among other  etiologies. No characteristics specifically suggestive of demyelinating disease are identified.  MRI cervical spine wwo contrast 07/29/2014:  Negative for demyelinating disease. The cervical cord is normal in appearance. Shallow disc bulge C6-7 without central canal or foraminal narrowing.  MRI lumbar spine wo contrast 07/22/2014: Minor lumbar spondylosis. No compressive lesion. No intraspinal abnormality.  EMG 07/28/2014 left arm and bilateral lower extremities: 1. There is electrophysiologic evidence of an asymmetric acute common peroneal neuropathy at the fibular head affecting bilateral lower extremities, predominately demyelinating with secondary axon loss. These findings are severe in degree electrically on the left, and mild on the right. 2. A superimposed L5 radiculopathy cannot be excluded.  CSF  R 0 W2 G50 P33  IgG index 0.52, no OCB, ACE 8 VZV not detected, CSF cytology negative, cultures negative to date CSF Lyme, HSV, and myelin basic protein is still pending.  Labs 07/25/2014:  ANA negative, ACE 21, CK 30, TSH 1.75, ESR 18  Serum 08/02/2014:  c/p-ANCA neg, MPO antibody neg, proteinase-3 antibody neg, SPEP/UPEP with IFE no M protein, Hepatitis acute panel neg, TSH 0.925, vitamin B12 733, ceruloplasmin 29, CRP <0.5, RF <10, ANA neg, ENA neg, copper 129, cryoglobulin negative, selenium 130  Labs 08/22/2014:  Zinc 44*, vitamin B6 18.8, HIV NR, lead negative, vitamin E 0.6  IMPRESSION: Ms. Kaufman is a 44 year-old female returning for evaluation of gradually progressive paresthesias (legs > arms > face) and left foot drop.Her exam is consistent with left >> right peroneal mononeuropathy, which is also evidenced by her EMG.  She has made marked improvement of left foot drop, but still with weakness of dorsiflexion, inversion, and eversion.  Left toe extension is now full strength!  She had underwent extensive work-up including MRI brain, c-spine, lumbar spine, CSF testing, EMG, and  serology testing. Only abnormality on labs was low zinc level and she is now being supplemented for this. Based on her work-up thus far, she has evidence of bilateral common perononeal mononeuropathy without involvement of other nerves electrodiagnostically.  In the absence of albuminocytologic dissociation or inflammatory changes involving the CSF, vasculitic neuropathy such as mononeuritis multiplex needs to be considered; however, her inflammatory/autoimmune markers are all returning normal which is atypical for inflammatory vasculitis and she is showing improvement without intervention.    Further, history of significant weight loss needs to be considered as normal fat pad protecting the nerve may be compromised, however this would not explain her upper extremity and facial paresthesias.  Second opinion at [facility] also agreed that symptoms are most likely due to weight loss.    NCS/EMG in December did not show any neuropathy affecting the left upper extremity.  If hand paresthesias worsen, may need to repeat this.    PLAN/RECOMMENDATIONS:  1.  Start taking gabapentin 171m at bedtime for one week, then increase to 1 tablet twice daily 2.  Check zinc level, continue zinc 543mdaily 3.  Continue home exercises  4.  Return to clinic in 3 months   The duration of this appointment visit was 25 minutes of face-to-face time with the patient.  Greater than 50% of this time was spent in counseling, explanation of diagnosis, planning of further management, and coordination of care.   Thank you for allowing me to participate in patient's care.  If I can answer any additional questions, I would be pleased to do so.    Sincerely,    Donika K. PaPosey ProntoDO

## 2014-10-24 LAB — ZINC: Zinc: 71 ug/dL (ref 60–130)

## 2014-10-31 ENCOUNTER — Ambulatory Visit: Payer: BLUE CROSS/BLUE SHIELD | Admitting: Family Medicine

## 2014-12-19 ENCOUNTER — Encounter: Payer: Self-pay | Admitting: Neurology

## 2014-12-26 ENCOUNTER — Encounter: Payer: Self-pay | Admitting: Neurology

## 2014-12-26 ENCOUNTER — Ambulatory Visit (INDEPENDENT_AMBULATORY_CARE_PROVIDER_SITE_OTHER): Payer: BLUE CROSS/BLUE SHIELD | Admitting: Neurology

## 2014-12-26 VITALS — BP 110/80 | HR 61 | Ht 68.0 in | Wt 169.4 lb

## 2014-12-26 DIAGNOSIS — R209 Unspecified disturbances of skin sensation: Secondary | ICD-10-CM | POA: Diagnosis not present

## 2014-12-26 DIAGNOSIS — G5732 Lesion of lateral popliteal nerve, left lower limb: Secondary | ICD-10-CM | POA: Diagnosis not present

## 2014-12-26 DIAGNOSIS — R292 Abnormal reflex: Secondary | ICD-10-CM | POA: Diagnosis not present

## 2014-12-26 DIAGNOSIS — R202 Paresthesia of skin: Secondary | ICD-10-CM | POA: Diagnosis not present

## 2014-12-26 LAB — VITAMIN B12: Vitamin B-12: 958 pg/mL — ABNORMAL HIGH (ref 211–911)

## 2014-12-26 LAB — C-REACTIVE PROTEIN

## 2014-12-26 MED ORDER — NORTRIPTYLINE HCL 10 MG PO CAPS: 10.0000 mg | ORAL_CAPSULE | Freq: Every day | ORAL | Status: DC

## 2014-12-26 NOTE — Patient Instructions (Addendum)
1.  Start nortriptyline 3m at bedtime 2.  Stop gabapentin  3.  Recheck zinc level, vitamin B12, ESR, CRP 4.  EMG of the upper extremities 5.  Continue home exercises  6.  Return to clinic in 2 months

## 2014-12-26 NOTE — Progress Notes (Signed)
Follow-up Visit   Date: 12/26/2014    [name] MRN: [id number] DOB: Jul 17, 1971   Interim History: [name] is a 44 y.o. right-handed Caucasian female with depression/anxiety and prior obesity s/p gastric bypass (March 2015, lost 120lb+), and arthritis of the knees returning to the clinic for follow-up of left foot drop and generalized paresthesias.  The patient was accompanied to the clinic by self.  History of present illness: Around late September 2015, she developed numbness of her 4th toe which progressed to involve the dorsum of the foot over a few weeks.  Soon, she noticed tingling/numbness over the left lateral lower leg.  Early December, she developed similar numbness/tingling over the right leg (toes, dorsum of the foot, and lateral leg).  Within a week, left lateral thigh started feeling different, she developed burning pain of her left > right hand and forearm, and noted numbness of the nose and lips.  She is having greater difficulty with walking due to left foot weakness, as if she is tripping over her own toes and is falling about once every other day.  She is able to get up by herself and has not sustained traumatic injuries, but had bruised herself quite a bit.  There is no preceding lightheadedness, changes in vision, or palpitations. She has been having a lot of cramp and muscle spasms in her left lower leg.  She endorses dry eyes and dry mouth. She denies any bowel/bladder incontinence, drooling, difficulty swallowing.  Her taste has also been more bland lately.  She was diabetic from 2011-2015 with HbA1c ranging ~8.4% but this resolved after her weight loss surgery.    She has been seeing Dr. Tamala Julian with Sports Medicine for ongoing problems with her knees and during his evaluation in early December, she had raised these new symptoms to him and her exam showed new left foot drop.  MRI of the lumbar spine wo contrast was ordered and did not show any nerve root  impingement. She was given a trial of prednisone taper over one week, and she denies any significant change.  UPDATE 08/22/2014:  Patient is here to discuss the results of testing to-date, including EMG, CSF, imaging, and serology testing.  Notable findings include subacute bilateral common peroneal mononeuropathy.  Serology testing and CSF has been normal.  She feels that her hands started burning and tingling which was severe a few days ago.  She is falling less but is stumbling more frequently.  Her legs are stable, without any worsening.  She continues to have numbness of the face especially nose.    UPDATE 10/21/2014:  Currently, she continues to have burning sensations of the hands and feet and has not tried taking gabapentin yet.  No weakness of the hands.  Her left foot weakness is slowly improving, especially with her toes.  She still has difficulty raising the left foot.  Fortunately, she has only had one fall since her last visit.   UPDATE 12/26/2014:  She tried taking gabapentin, but this made her very sleepy so stopped it.   She reports doing well until three weeks ago, then developed worsening paresthesias of her hands and slight worsening of her foot drop.  She is dropping things and has difficulty opening jars.  Additionally, she feels as if her mouth is also tingling and has two episodes of swallowing difficulty.     Medications:  Current Outpatient Prescriptions on File Prior to Visit  Medication Sig Dispense Refill  . acetaminophen (TYLENOL) 500  MG tablet Take 1,000 mg by mouth every 6 (six) hours as needed for mild pain.    Marland Kitchen alprazolam (XANAX) 2 MG tablet Take 1-2 mg by mouth 2 (two) times daily as needed for anxiety.     . Biotin 1000 MCG tablet Take 1,000 mcg by mouth 3 (three) times daily.    . Calcium Carb-Ergocalciferol 250-125 MG-UNIT TABS Take 1 tablet by mouth.    . Calcium Citrate (CAL-CITRATE PO) Take by mouth.    . Diclofenac Sodium (PENNSAID) 2 % SOLN Place 2  application onto the skin 2 (two) times daily. 112 g 3  . DULoxetine (CYMBALTA) 60 MG capsule Take 120 mg by mouth every morning.     Marland Kitchen ELIGEN B12 1000-100 MCG-MG TABS TAKE 1 TABLET BY MOUTH DAILY. 30 tablet 6  . EQL NATURAL ZINC 50 MG TABS Take by mouth.    . FOLIC ACID PO Take by mouth.    . gabapentin (NEURONTIN) 100 MG capsule Take 1 capsule (100 mg total) by mouth 2 (two) times daily. (Patient not taking: Reported on 10/21/2014) 60 capsule 5  . mometasone (ELOCON) 0.1 % ointment     . Multiple Vitamin (MULTIVITAMIN) tablet Take 1 tablet by mouth daily.    . ondansetron (ZOFRAN) 4 MG tablet TAKE 1 TABLET (4 MG TOTAL) BY MOUTH EVERY 8 (EIGHT) HOURS AS NEEDED FOR NAUSEA OR VOMITING. 9 tablet 0  . Rice Bran OIL Take by mouth.    . zolpidem (AMBIEN) 10 MG tablet Take 10 mg by mouth at bedtime.      No current facility-administered medications on file prior to visit.    Allergies:  Allergies  Allergen Reactions  . Other Hives  . Sulfa Antibiotics Other (See Comments)    HALLUCINATIONS, FEVER, CHILLS  . Bactrim [Sulfamethoxazole-Trimethoprim] Other (See Comments)    Hallucinations, fever chills    Review of Systems:  CONSTITUTIONAL: No fevers, chills, night sweats, + 120lb weight loss.  EYES: No visual changes or eye pain ENT: No hearing changes.  No history of nose bleeds.   RESPIRATORY: No cough, wheezing and shortness of breath.   CARDIOVASCULAR: Negative for chest pain, and palpitations.   GI: Negative for abdominal discomfort, blood in stools or black stools.  No recent change in bowel habits.   GU:  No history of incontinence.   MUSCLOSKELETAL: No history of joint pain or swelling.  No myalgias.   SKIN: Negative for lesions, rash, and itching.   ENDOCRINE: Negative for cold or heat intolerance, polydipsia or goiter.   PSYCH:  No depression or anxiety symptoms.   NEURO: As Above.   Vital Signs:  BP 110/80 mmHg  Pulse 61  Ht _0  (1.727 m)  Wt 169 lb 7 oz (76.856 kg)   BMI 25.77 kg/m2  SpO2 99%  Neurological Exam: MENTAL STATUS including orientation to time, place, person, recent and remote memory, attention span and concentration, language, and fund of knowledge is normal.  Speech is not dysarthric.  CRANIAL NERVES: No visual field defects.  Pupils equal round and reactive to light.  Normal conjugate, extra-ocular eye movements in all directions of gaze.  No ptosis. Normal facial sensation.  Face is symmetric. Palate elevates symmetrically.  Tongue is midline.   MOTOR:  No atrophy, fasciculations or abnormal movements.  No pronator drift.  Tone is normal.    Right Upper Extremity:    Left Upper Extremity:    Deltoid  5/5   Deltoid  5/5   Biceps  5/5   Biceps  5/5   Triceps  5/5   Triceps  5/5   Wrist extensors  5/5   Wrist extensors  5/5   Wrist flexors  5/5   Wrist flexors  5/5   Finger extensors  5/5   Finger extensors  5/5   Finger flexors  5/5   Finger flexors  5/5   Dorsal interossei  5-/5   Dorsal interossei  5-/5   Abductor pollicis  5/5   Abductor pollicis  5/5   Tone (Ashworth scale)  0  Tone (Ashworth scale)  0   Right Lower Extremity:    Left Lower Extremity:    Hip flexors  5/5   Hip flexors  5/5   Hip extensors  5/5   Hip extensors  5/5   Knee flexors  5/5   Knee flexors  5/5   Knee extensors  5/5   Knee extensors  5/5   Inversion 5/5  Inversion* 4/5  Eversion 5/5  Eversion* 4/5  Dorsiflexors  5/5   Dorsiflexors  4/5   Plantarflexors  5/5   Plantarflexors  5/5   Toe extensors  5/5   Toe extensors  5-/5   Toe flexors  5/5   Toe flexors  5/5   Tone (Ashworth scale)  0  Tone (Ashworth scale)  0   MSRs:  Right                                                                 Left brachioradialis 2+  brachioradialis 2+  biceps 2+  biceps 2+  triceps 2+  triceps 2+  patellar 3+  patellar 3+  ankle jerk 2+  ankle jerk 2+  Hoffman no  Hoffman no  plantar response down  plantar response down   SENSORY:  Reduced sensation to all  modalities over the left >> right lateral lower leg and dorsum of the foot.  COORDINATION/GAIT:  Normal finger-to- nose-finger. Gait appears stable.  Able to walk on toes, difficulty left heel walking  Data: MRI brain wwo contrast 07/29/2014:  Several punctate foci of white matter T2 signal abnormality, nonspecific and not necessarily greater than expected for patient's age. These could reflect minimal chronic small vessel ischemia or sequelae of migraines, among other etiologies. No characteristics specifically suggestive of demyelinating disease are identified.  MRI cervical spine wwo contrast 07/29/2014:  Negative for demyelinating disease. The cervical cord is normal in appearance. Shallow disc bulge C6-7 without central canal or foraminal narrowing.  MRI lumbar spine wo contrast 07/22/2014: Minor lumbar spondylosis. No compressive lesion. No intraspinal abnormality.  EMG 07/28/2014 left arm and bilateral lower extremities: 1. There is electrophysiologic evidence of an asymmetric acute common peroneal neuropathy at the fibular head affecting bilateral lower extremities, predominately demyelinating with secondary axon loss. These findings are severe in degree electrically on the left, and mild on the right. 2. A superimposed L5 radiculopathy cannot be excluded.  CSF  R 0 W2 G50 P33  IgG index 0.52, no OCB, ACE 8 VZV not detected, CSF cytology negative, cultures negative to date CSF Lyme, HSV, and myelin basic protein is still pending.  Labs 07/25/2014:  ANA negative, ACE 21, CK 30, TSH 1.75, ESR 18  Serum 08/02/2014:  c/p-ANCA neg, MPO antibody neg,  proteinase-3 antibody neg, SPEP/UPEP with IFE no M protein, Hepatitis acute panel neg, TSH 0.925, vitamin B12 733, ceruloplasmin 29, CRP <0.5, RF <10, ANA neg, ENA neg, copper 129, cryoglobulin negative, selenium 130  Labs 08/22/2014:  Zinc 44*, vitamin B6 18.8, HIV NR, lead negative, vitamin E 0.6  Labs 10/21/2014:  Zinc 71  IMPRESSION: Ms.  Spizzirri is a 44 year-old female returning for evaluation of gradually progressive paresthesias (legs > arms > face) and left foot drop.Her exam is consistent with left >> right peroneal mononeuropathy, which is also evidenced by her EMG. She was doing well until late April, when she noticed worsening paresthesias of her hands and face and slight worsening of left foot drop.  She had underwent extensive work-up including MRI brain, c-spine, lumbar spine, CSF testing, EMG, and serology testing. Only abnormality on labs was low zinc level.  She was supplemented for several months and at her last visit, levels has normalized, so it was stopped.  Based on her work-up thus far, she has evidence of bilateral common perononeal mononeuropathy without involvement of other nerves electrodiagnostically.  In the absence of albuminocytologic dissociation or inflammatory changes involving the CSF, vasculitic neuropathy such as mononeuritis multiplex needs to be considered; however, her inflammatory/autoimmune markers are all returning normal which is atypical for inflammatory vasculitis and she is showing improvement without intervention.    Further, history of significant weight loss needs to be considered as normal fat pad protecting the nerve may be compromised, however this would not explain her upper extremity and facial paresthesias.  Second opinion at [facility] also agreed that symptoms are most likely due to weight loss.    NCS/EMG in December did not show any neuropathy affecting the left upper extremity. Given worsening bilateral hand paresthesias, will proceed with electrodiagnostic testing of the upper extremities.  Consider restarting zinc supplementation going forward.  PLAN/RECOMMENDATIONS:  1.  Start nortriptyline 11m at bedtime 2.  Stop gabapentin  3.  Recheck zinc level, vitamin B12, ESR, CRP 4.  EMG of the upper extremities 5.  Continue home exercises  6.  Return to clinic in 3  months   The duration of this appointment visit was 30 minutes of face-to-face time with the patient.  Greater than 50% of this time was spent in counseling, explanation of diagnosis, planning of further management, and coordination of care.   Thank you for allowing me to participate in patient's care.  If I can answer any additional questions, I would be pleased to do so.    Sincerely,    Trenna Kiely K. PPosey Pronto DO

## 2014-12-27 LAB — SEDIMENTATION RATE: SED RATE: 6 mm/h (ref 0–20)

## 2014-12-28 LAB — ZINC: Zinc: 72 ug/dL (ref 60–130)

## 2015-01-05 ENCOUNTER — Encounter: Payer: Self-pay | Admitting: *Deleted

## 2015-01-05 ENCOUNTER — Ambulatory Visit (INDEPENDENT_AMBULATORY_CARE_PROVIDER_SITE_OTHER): Payer: BLUE CROSS/BLUE SHIELD | Admitting: Neurology

## 2015-01-05 DIAGNOSIS — G5601 Carpal tunnel syndrome, right upper limb: Secondary | ICD-10-CM

## 2015-01-05 NOTE — Procedures (Signed)
[facility]  [address] [place]  [place], [place] [address] Tel: [phone] Fax:  [phone] Test Date:  01/05/2015  Patient: [name] DOB: 05/07/1971 Physician: Narda Amber, DO  Sex: Female Height: 5\' 8"  Ref Phys: Narda Amber  ID#: [id number] Temp: 34.0C Technician: Laureen Ochs R. NCS T.   Patient Complaints: Patient is a 44 year old female here for evaluation of her hands due to weakness, burning and numbness.  NCV & EMG Findings: Extensive electrodiagnostic testing of the right upper extremity and additional studies of the left upper extremity shows:  1. Right median sensory response is prolonged (3.5 ms) with preserved amplitude. Bilateral ulnar, radial, and the left median as well as palmar studies are within normal limits. 2. Bilateral median and ulnar motor responses are within normal limits. 3. Ulnar F wave studies are within normal limits. 4. Sparse chronic motor axon loss changes isolated to the right abductor pollicis brevis muscle, without accompanied active denervation. The remaining muscles showed normal motor unit configuration and recruitment pattern.  Impression: 1. Right median neuropathy at or distal to the wrist, consistent with the clinical diagnosis of carpal tunnel syndrome. Overall, these findings are mild in degree electrically. 2. There is no evidence of a sensorimotor polyneuropathy, cervical radiculopathy or polyradiculoneuropathy affecting the upper extremities. A small fiber neuropathy cannot be excluded by this study.   ___________________________ Narda Amber, DO    Nerve Conduction Studies Anti Sensory Summary Table   Site NR Peak (ms) Norm Peak (ms) P-T Amp (V) Norm P-T Amp  Left Median Anti Sensory (2nd Digit)  34C  Wrist    3.0 <3.4 52.3 >20  Right Median Anti Sensory (2nd Digit)  34C  Wrist    3.5 <3.4 50.1 >20  Left Radial Anti Sensory (Base 1st Digit)  34C  Wrist    2.0 <2.7 45.3 >18  Right Radial Anti  Sensory (Base 1st Digit)  34C  Wrist    2.1 <2.7 37.8 >18  Left Ulnar Anti Sensory (5th Digit)  34C  Wrist    2.5 <3.1 42.6 >12  Right Ulnar Anti Sensory (5th Digit)  34C  Wrist    2.9 <3.1 47.5 >12   Motor Summary Table   Site NR Onset (ms) Norm Onset (ms) O-P Amp (mV) Norm O-P Amp Site1 Site2 Delta-0 (ms) Dist (cm) Vel (m/s) Norm Vel (m/s)  Left Median Motor (Abd Poll Brev)  34C  Wrist    3.4 <3.9 13.9 >6 Elbow Wrist 4.7 31.0 66 >50  Elbow    8.1  11.0         Right Median Motor (Abd Poll Brev)  34C  Wrist    3.8 <3.9 13.9 >6 Elbow Wrist 5.2 28.5 55 >50  Elbow    9.0  12.3         Left Ulnar Motor (Abd Dig Minimi)  34C  Wrist    2.3 <3.1 10.2 >7 B Elbow Wrist 3.8 24.0 63 >50  B Elbow    6.1  9.9  A Elbow B Elbow 2.0 10.0 50 >50  A Elbow    8.1  9.8         Right Ulnar Motor (Abd Dig Minimi)  34C  Wrist    2.4 <3.1 11.1 >7 B Elbow Wrist 3.9 22.0 56 >50  B Elbow    6.3  10.1  A Elbow B Elbow 2.0 10.0 50 >50  A Elbow    8.3  10.0  Comparison Summary Table   Site NR Peak (ms) Norm Peak (ms) P-T Amp (V) Site1 Site2 Delta-P (ms) Norm Delta (ms)  Left Median/Ulnar Palm Comparison (Wrist - 8cm)  34C  Median Palm    1.7 <2.2 107.3 Median Palm Ulnar Palm 0.1   Ulnar Palm    1.6 <2.2 29.6       F Wave Studies   NR F-Lat (ms) Lat Norm (ms) L-R F-Lat (ms)  Left Ulnar (Mrkrs) (Abd Dig Min)  34C     28.17 <33 0.56  Right Ulnar (Mrkrs) (Abd Dig Min)  34C     28.73 <33 0.56   EMG   Side Muscle Ins Act Fibs Psw Fasc Number Recrt Dur Dur. Amp Amp. Poly Poly. Comment  Left 1stDorInt Nml Nml Nml Nml Nml Nml Nml Nml Nml Nml Nml Nml N/A  Left Ext Indicis Nml Nml Nml Nml Nml Nml Nml Nml Nml Nml Nml Nml N/A  Left PronatorTeres Nml Nml Nml Nml Nml Nml Nml Nml Nml Nml Nml Nml N/A  Left Biceps Nml Nml Nml Nml Nml Nml Nml Nml Nml Nml Nml Nml N/A  Left Triceps Nml Nml Nml Nml Nml Nml Nml Nml Nml Nml Nml Nml N/A  Left Deltoid Nml Nml Nml Nml Nml Nml Nml Nml Nml Nml Nml Nml N/A    Right 1stDorInt Nml Nml Nml Nml Nml Nml Nml Nml Nml Nml Nml Nml N/A  Right Abd Poll Brev Nml Nml Nml Nml 1- Mod-R Some 1+ Few 1+ Nml Nml N/A  Right Ext Indicis Nml Nml Nml Nml Nml Nml Nml Nml Nml Nml Nml Nml N/A  Right PronatorTeres Nml Nml Nml Nml Nml Nml Nml Nml Nml Nml Nml Nml N/A  Right Biceps Nml Nml Nml Nml Nml Nml Nml Nml Nml Nml Nml Nml N/A  Right Triceps Nml Nml Nml Nml Nml Nml Nml Nml Nml Nml Nml Nml N/A  Right Deltoid Nml Nml Nml Nml Nml Nml Nml Nml Nml Nml Nml Nml N/A  Right Cervical Parasp Low Nml Nml Nml Nml Nml Nml Nml Nml Nml Nml Nml Nml N/A      Waveforms:

## 2015-01-06 ENCOUNTER — Telehealth: Payer: Self-pay | Admitting: *Deleted

## 2015-01-06 NOTE — Telephone Encounter (Signed)
I called patient to let her know that her appointment with Dr. Ileene Rubens is on August 3 at 4:30.

## 2015-01-11 ENCOUNTER — Ambulatory Visit: Payer: BLUE CROSS/BLUE SHIELD | Admitting: Neurology

## 2015-01-23 ENCOUNTER — Encounter: Payer: Self-pay | Admitting: Neurology

## 2015-01-23 ENCOUNTER — Ambulatory Visit: Payer: BLUE CROSS/BLUE SHIELD | Admitting: Neurology

## 2015-02-07 ENCOUNTER — Encounter: Payer: BLUE CROSS/BLUE SHIELD | Admitting: Neurology

## 2015-02-22 ENCOUNTER — Ambulatory Visit: Payer: BLUE CROSS/BLUE SHIELD | Admitting: Neurology

## 2015-02-23 ENCOUNTER — Encounter: Payer: Self-pay | Admitting: Neurology

## 2015-02-23 ENCOUNTER — Ambulatory Visit (INDEPENDENT_AMBULATORY_CARE_PROVIDER_SITE_OTHER): Payer: BLUE CROSS/BLUE SHIELD | Admitting: Neurology

## 2015-02-23 VITALS — BP 130/78 | HR 69 | Ht 68.0 in | Wt 171.2 lb

## 2015-02-23 DIAGNOSIS — R209 Unspecified disturbances of skin sensation: Secondary | ICD-10-CM

## 2015-02-23 DIAGNOSIS — H539 Unspecified visual disturbance: Secondary | ICD-10-CM | POA: Diagnosis not present

## 2015-02-23 DIAGNOSIS — G609 Hereditary and idiopathic neuropathy, unspecified: Secondary | ICD-10-CM

## 2015-02-23 DIAGNOSIS — G43109 Migraine with aura, not intractable, without status migrainosus: Secondary | ICD-10-CM | POA: Diagnosis not present

## 2015-02-23 DIAGNOSIS — R202 Paresthesia of skin: Secondary | ICD-10-CM

## 2015-02-23 MED ORDER — NORTRIPTYLINE HCL 10 MG/5ML PO SOLN: ORAL | Status: DC

## 2015-02-23 NOTE — Patient Instructions (Addendum)
1.  MRI brain wwo contrast.  We will call you with the results of your testing. 2.  Switch to nortriptyline liquid solution 10mg /78mL  - start taking 48mL (20mg ) at bedtime for 3 days, then increase to 15 mL (30mg ) x 2 weeks, if tolerating, you can increase further to 71mL (40mg ) at bedtime 3.  Return to clinic as needed

## 2015-02-23 NOTE — Progress Notes (Signed)
Follow-up Visit   Date: 02/23/2015    Pantera Winterrowd MRN: [id number] DOB: Jun 18, 1971   Interim History: Payal Stanforth is a 44 y.o. right-handed Caucasian female with depression/anxiety and prior obesity s/p gastric bypass (March 2015, lost 120lb+), and arthritis of the knees returning to the clinic for follow-up of left foot drop and generalized paresthesias.  The patient was accompanied to the clinic by self.  History of present illness: Around late September 2015, she developed numbness of her 4th toe which progressed to involve the dorsum of the foot over a few weeks.  Soon, she noticed tingling/numbness over the left lateral lower leg.  Early December, she developed similar numbness/tingling over the right leg (toes, dorsum of the foot, and lateral leg).  Within a week, left lateral thigh started feeling different, she developed burning pain of her left > right hand and forearm, and noted numbness of the nose and lips.  She is having greater difficulty with walking due to left foot weakness, as if she is tripping over her own toes and is falling about once every other day.  She is able to get up by herself and has not sustained traumatic injuries, but had bruised herself quite a bit.  She endorses dry eyes and dry mouth. Her taste has also been more bland lately.  She was diabetic from 2011-2015 with HbA1c ranging ~8.4% but this resolved after her weight loss surgery.    She has been seeing Dr. Tamala Julian with Sports Medicine for ongoing problems with her knees and during his evaluation in early December, she had raised these new symptoms to him and her exam showed new left foot drop.  MRI of the lumbar spine wo contrast was ordered and did not show any nerve root impingement. She was given a trial of prednisone taper over one week, and she denies any significant change.  UPDATE 08/22/2014:  Patient is here to discuss the results of testing to-date, including EMG, CSF, imaging, and serology  testing.  Notable findings include subacute bilateral common peroneal mononeuropathy.  Serology testing and CSF has been normal.  She feels that her hands started burning and tingling which was severe a few days ago. Her legs are stable, without any worsening.  She continues to have numbness of the face especially nose.    UPDATE 10/21/2014:  Currently, she continues to have burning sensations of the hands and feet and has not tried taking gabapentin yet.  No weakness of the hands.  Her left foot weakness is slowly improving, especially with her toes.  She still has difficulty raising the left foot.  Fortunately, she has only had one fall since her last visit.   UPDATE 12/26/2014:  She tried taking gabapentin, but this made her very sleepy so stopped it.   She reports doing well until three weeks ago, then developed worsening paresthesias of her hands and slight worsening of her foot drop.  She is dropping things and has difficulty opening jars.  Additionally, she feels as if her mouth is also tingling and has two episodes of swallowing difficulty.    UPDATE 02/23/2015:  There has been worsening painful paresthesias of the hands, legs, face, lips, nose, and cheeks. She had new onset of bifrontal headaches, pressure-like which started three weeks ago and has been constant since then.  She has blurred vision, transient binocular vision loss x 3 where it went black and once where it went white. This lasted only a few seconds.  She has associated  photophobia.  No phonophobia, nausea, vomiting.  She tried tylenol which does not help.  She is unable to take NSAIDs because of her gastric bypass.     Medications:  Current Outpatient Prescriptions on File Prior to Visit  Medication Sig Dispense Refill  . acetaminophen (TYLENOL) 500 MG tablet Take 1,000 mg by mouth every 6 (six) hours as needed for mild pain.    Marland Kitchen alprazolam (XANAX) 2 MG tablet Take 1-2 mg by mouth 2 (two) times daily as needed for anxiety.     .  Biotin 1000 MCG tablet Take 1,000 mcg by mouth 3 (three) times daily.    . Calcium Carb-Ergocalciferol 250-125 MG-UNIT TABS Take 1 tablet by mouth.    . Calcium Citrate (CAL-CITRATE PO) Take by mouth.    . Diclofenac Sodium (PENNSAID) 2 % SOLN Place 2 application onto the skin 2 (two) times daily. 112 g 3  . DULoxetine (CYMBALTA) 60 MG capsule Take 120 mg by mouth every morning.     Marland Kitchen ELIGEN B12 1000-100 MCG-MG TABS TAKE 1 TABLET BY MOUTH DAILY. 30 tablet 6  . EQL NATURAL ZINC 50 MG TABS Take by mouth.    . FOLIC ACID PO Take by mouth.    . mometasone (ELOCON) 0.1 % ointment     . Multiple Vitamin (MULTIVITAMIN) tablet Take 1 tablet by mouth daily.    . ondansetron (ZOFRAN) 4 MG tablet TAKE 1 TABLET (4 MG TOTAL) BY MOUTH EVERY 8 (EIGHT) HOURS AS NEEDED FOR NAUSEA OR VOMITING. 9 tablet 0  . Rice Bran OIL Take by mouth.     No current facility-administered medications on file prior to visit.    Allergies:  Allergies  Allergen Reactions  . Other Hives  . Sulfa Antibiotics Other (See Comments)    HALLUCINATIONS, FEVER, CHILLS  . Bactrim [Sulfamethoxazole-Trimethoprim] Other (See Comments)    Hallucinations, fever chills    Review of Systems:  CONSTITUTIONAL: No fevers, chills, night sweats, + 125lb weight loss.  EYES: +visual changes or eye pain ENT: No hearing changes.  No history of nose bleeds.   RESPIRATORY: No cough, wheezing and shortness of breath.   CARDIOVASCULAR: Negative for chest pain, and palpitations.   GI: Negative for abdominal discomfort, blood in stools or black stools.  No recent change in bowel habits.   GU:  No history of incontinence.   MUSCLOSKELETAL: No history of joint pain or swelling.  No myalgias.   SKIN: Negative for lesions, rash, and itching.   ENDOCRINE: Negative for cold or heat intolerance, polydipsia or goiter.   PSYCH:  No depression or anxiety symptoms.   NEURO: As Above.   Vital Signs:  BP 130/78 mmHg  Pulse 69  Ht '5\' 8"'  (1.727 m)  Wt 171  lb 4 oz (77.678 kg)  BMI 26.04 kg/m2  SpO2 99%  Neurological Exam: MENTAL STATUS including orientation to time, place, person, recent and remote memory, attention span and concentration, language, and fund of knowledge is normal.  Speech is not dysarthric.  CRANIAL NERVES:  No visual field defects.  Pupils equal round and reactive to light.  Normal conjugate, extra-ocular eye movements in all directions of gaze.  No ptosis. Normal facial sensation.  Face is symmetric. Palate elevates symmetrically.  Tongue is midline.   MOTOR:  No atrophy, fasciculations or abnormal movements.  No pronator drift.  Tone is normal.    Right Upper Extremity:    Left Upper Extremity:    Deltoid  5/5   Deltoid  5/5   Biceps  5/5   Biceps  5/5   Triceps  5/5   Triceps  5/5   Wrist extensors  5/5   Wrist extensors  5/5   Wrist flexors  5/5   Wrist flexors  5/5   Finger extensors  5/5   Finger extensors  5/5   Finger flexors  5/5   Finger flexors  5/5   Dorsal interossei  5-/5   Dorsal interossei  5-/5   Abductor pollicis  5/5   Abductor pollicis  5/5   Tone (Ashworth scale)  0  Tone (Ashworth scale)  0   Right Lower Extremity:    Left Lower Extremity:    Hip flexors  5/5   Hip flexors  5/5   Hip extensors  5/5   Hip extensors  5/5   Knee flexors  5/5   Knee flexors  5/5   Knee extensors  5/5   Knee extensors  5/5   Inversion 5/5  Inversion 5/5  Eversion 5/5  Eversion 5/5  Dorsiflexors  5/5   Dorsiflexors  5/5   Plantarflexors  5/5   Plantarflexors  5/5   Toe extensors  5/5   Toe extensors  5-/5   Toe flexors  5/5   Toe flexors  5/5   Tone (Ashworth scale)  0  Tone (Ashworth scale)  0   MSRs:  Right                                                                 Left brachioradialis 2+  brachioradialis 2+  biceps 2+  biceps 2+  triceps 2+  triceps 2+  patellar 3+  patellar 3+  ankle jerk 2+  ankle jerk 2+  Hoffman no  Hoffman no  plantar response down  plantar response down   SENSORY:  Reduced  pin prick in all extremities and face.   Reduced sensation to all modalities over the left >> right lateral lower leg and dorsum of the foot.  COORDINATION/GAIT:  Normal finger-to- nose-finger. Gait appears stable.  Able to walk on toes, difficulty left heel walking  Data:  EMG upper extremities 01/05/2015: 1. Right median neuropathy at or distal to the wrist, consistent with the clinical diagnosis of carpal tunnel syndrome. Overall, these findings are mild in degree electrically. 2. There is no evidence of a sensorimotor polyneuropathy, cervical radiculopathy or polyradiculoneuropathy affecting the upper extremities. A small fiber neuropathy cannot be excluded by this study.  MRI brain wwo contrast 07/29/2014:  Several punctate foci of white matter T2 signal abnormality, nonspecific and not necessarily greater than expected for patient's age. These could reflect minimal chronic small vessel ischemia or sequelae of migraines, among other etiologies. No characteristics specifically suggestive of demyelinating disease are identified.  MRI cervical spine wwo contrast 07/29/2014:  Negative for demyelinating disease. The cervical cord is normal in appearance. Shallow disc bulge C6-7 without central canal or foraminal narrowing.  MRI lumbar spine wo contrast 07/22/2014: Minor lumbar spondylosis. No compressive lesion. No intraspinal abnormality.  EMG 07/28/2014 left arm and bilateral lower extremities: 3. There is electrophysiologic evidence of an asymmetric acute common peroneal neuropathy at the fibular head affecting bilateral lower extremities, predominately demyelinating with secondary axon loss. These findings are severe in degree electrically  on the left, and mild on the right. 4. A superimposed L5 radiculopathy cannot be excluded.  CSF 07/31/2014:   R 0 W2 G50 P33  IgG index 0.52, no OCB, ACE 8 VZV not detected, CSF cytology negative, cultures negative, CSF Lyme neg, HSV neg, myelin basic  protein neg  Serum 07/2014:  ANA negative, ACE 21, CK 30, TSH 1.75, ESR 18, c/p-ANCA neg, MPO antibody neg, proteinase-3 antibody neg, SPEP/UPEP with IFE no M protein, Hepatitis acute panel neg, TSH 0.925, vitamin B12 733, ceruloplasmin 29, CRP <0.5, RF <10, ANA neg, ENA neg, copper 129, cryoglobulin negative, selenium 130  Labs 08/22/2014:  Zinc 44*, vitamin B6 18.8, HIV NR, lead negative, vitamin E 0.6  Labs 10/21/2014:  Zinc 71  Labs 12/26/2014:  Zinc 72, vitamin B12 958, ESR 6, CRP <0.5   IMPRESSION: Ms. Hightower is a 44 year-old female returning for evaluation of progressive paresthesias (legs > arms > face) and left foot drop. Since her last visit, she reports having worsening paresthesias of the face, hands, and legs.  Left foot drop has improved and strength is almost full.  She complains of new bifrontal headaches with spells of vision loss, which is suggestive of ocular migraine vs migraine equivalent.  With the worsening symptoms, repeat imaging of the brain will be ordered.   She is tolerating nortriptlyine 41m well, but does not have any noticeable benefit, so will plan to increase the dose.  She presented with left foot drop and generalized paresthesias in November 2015 and underwent extensive work-up including MRI brain, c-spine, lumbar spine, CSF testing, EMG, and serology testing. Only abnormality on labs was low zinc level.  She was supplemented for several months.  Based on her work-up thus far, she had evidence of bilateral common perononeal mononeuropathy (improved) without involvement of other nerves electrodiagnostically.  CSF testing and inflammatory/autoimmune markers returned normal and her foot drop started to improve without intervention, except for physical therapy.  [facility] that symptoms are most likely due to weight loss.    Her recent NCS/EMG of the upper extremities showed right CTS only.  No evidence of a generalized  polyneuropathy.  Small fiber neuropathy may still be possible.     PLAN/RECOMMENDATIONS:  1.  MRI brain wwo contrast 2.  Switch to liquid formulation of nortriptyline and increase to 316mx 2 weeks, then 4041mhereafter. 3.  Encouraged to have dilated eye exam 4.  Because of worsening paresthesias, she is scheduled to be re-evaluated [facility] and will follow-up with us [place] needed   The duration of this appointment visit was 30 minutes of face-to-face time with the patient.  Greater than 50% of this time was spent in counseling, explanation of diagnosis, planning of further management, and coordination of care.   Thank you for allowing me to participate in patient's care.  If I can answer any additional questions, I would be pleased to do so.    Sincerely,    Donika K. PatPosey ProntoO

## 2015-03-03 ENCOUNTER — Ambulatory Visit
Admission: RE | Admit: 2015-03-03 | Discharge: 2015-03-03 | Disposition: A | Discharge status: Home / Self Care | Payer: BLUE CROSS/BLUE SHIELD | Source: Ambulatory Visit | Attending: Neurology | Admitting: Neurology

## 2015-03-03 DIAGNOSIS — R209 Unspecified disturbances of skin sensation: Principal | ICD-10-CM

## 2015-03-03 DIAGNOSIS — G609 Hereditary and idiopathic neuropathy, unspecified: Secondary | ICD-10-CM

## 2015-03-03 DIAGNOSIS — H539 Unspecified visual disturbance: Secondary | ICD-10-CM

## 2015-03-03 DIAGNOSIS — R202 Paresthesia of skin: Secondary | ICD-10-CM

## 2015-03-03 DIAGNOSIS — G43109 Migraine with aura, not intractable, without status migrainosus: Secondary | ICD-10-CM

## 2015-03-03 MED ORDER — GADOBENATE DIMEGLUMINE 529 MG/ML IV SOLN
16.0000 mL | Freq: Once | INTRAVENOUS | Status: AC | PRN
  Administered 2015-03-03: 16 mL via INTRAVENOUS

## 2015-03-13 ENCOUNTER — Telehealth: Payer: Self-pay | Admitting: *Deleted

## 2015-03-13 NOTE — Telephone Encounter (Signed)
Dr Valetta Close [facility]) called stating he has seen the pt for episodic vision loss as she complains of headaches and episodes of times where her vision goes black in both eyes or white in both and he feels there is a low probability but it could be vascular but he is concerned and recommends the pt have a carotid doppler and echo ordered by Dr Maudie Mercury and asked if we needed a diagnosis to order these.  Dr Maudie Mercury was informed of this and I advised Dr Valetta Close per Dr Maudie Mercury she does not feel comfortable ordering these tests as she has not seen the pt in some time and if he feels it is urgent she should go to the ER now or see the neurologist as she has seen Dr Posey Pronto recently.  Dr Valetta Close stated he does not feel this is urgent and will contact Dr Serita Grit office.

## 2015-03-14 ENCOUNTER — Other Ambulatory Visit: Payer: Self-pay | Admitting: *Deleted

## 2015-03-14 DIAGNOSIS — R42 Dizziness and giddiness: Secondary | ICD-10-CM

## 2015-03-14 DIAGNOSIS — H5310 Unspecified subjective visual disturbances: Secondary | ICD-10-CM

## 2015-03-16 ENCOUNTER — Encounter: Payer: Self-pay | Admitting: *Deleted

## 2015-03-20 ENCOUNTER — Other Ambulatory Visit: Payer: Self-pay

## 2015-03-20 ENCOUNTER — Ambulatory Visit (HOSPITAL_COMMUNITY): Payer: BLUE CROSS/BLUE SHIELD | Attending: Cardiology

## 2015-03-20 DIAGNOSIS — H5310 Unspecified subjective visual disturbances: Secondary | ICD-10-CM

## 2015-03-20 DIAGNOSIS — I071 Rheumatic tricuspid insufficiency: Secondary | ICD-10-CM | POA: Insufficient documentation

## 2015-03-20 DIAGNOSIS — R42 Dizziness and giddiness: Secondary | ICD-10-CM | POA: Diagnosis not present

## 2015-03-21 ENCOUNTER — Encounter: Payer: Self-pay | Admitting: Neurology

## 2015-03-22 ENCOUNTER — Inpatient Hospital Stay (HOSPITAL_COMMUNITY): Admission: RE | Admit: 2015-03-22 | Payer: BLUE CROSS/BLUE SHIELD | Source: Ambulatory Visit

## 2015-04-03 ENCOUNTER — Ambulatory Visit (HOSPITAL_COMMUNITY)
Admission: RE | Admit: 2015-04-03 | Discharge: 2015-04-03 | Disposition: A | Discharge status: Home / Self Care | Payer: BLUE CROSS/BLUE SHIELD | Source: Ambulatory Visit | Attending: Cardiology | Admitting: Cardiology

## 2015-04-03 ENCOUNTER — Inpatient Hospital Stay (HOSPITAL_COMMUNITY): Admission: RE | Admit: 2015-04-03 | Payer: BLUE CROSS/BLUE SHIELD | Source: Ambulatory Visit

## 2015-04-03 DIAGNOSIS — H5310 Unspecified subjective visual disturbances: Secondary | ICD-10-CM | POA: Insufficient documentation

## 2015-04-03 DIAGNOSIS — R42 Dizziness and giddiness: Secondary | ICD-10-CM

## 2015-04-04 ENCOUNTER — Ambulatory Visit: Payer: BLUE CROSS/BLUE SHIELD | Admitting: Dietician

## 2015-04-12 ENCOUNTER — Encounter: Payer: Self-pay | Admitting: Family Medicine

## 2015-04-12 ENCOUNTER — Ambulatory Visit (INDEPENDENT_AMBULATORY_CARE_PROVIDER_SITE_OTHER): Payer: BLUE CROSS/BLUE SHIELD | Admitting: Family Medicine

## 2015-04-12 VITALS — BP 110/64 | HR 73 | Temp 98.0°F | Ht 68.0 in | Wt 171.7 lb

## 2015-04-12 DIAGNOSIS — Z23 Encounter for immunization: Secondary | ICD-10-CM

## 2015-04-12 DIAGNOSIS — F39 Unspecified mood [affective] disorder: Secondary | ICD-10-CM | POA: Insufficient documentation

## 2015-04-12 DIAGNOSIS — R946 Abnormal results of thyroid function studies: Secondary | ICD-10-CM

## 2015-04-12 NOTE — Progress Notes (Signed)
HPI:  Acute visit for:  Thyroid abnormality: -undergoing extensive eval for paresthesias of face and arms that started following gastric bypass, now seeing specialist (Dr. Ileene Rubens) at [facility] - reports told abnormal vessels in thyroid on a scan and told to see her PCP -denies any thyroid enlargement, dysphagia, hot/col intol, skin changes now - did have some swallowing issues a few times after gastric bypass -per neurology notes on carotid duplex they found "incidental collection of blood vessels seen in the thyroid gland and she may need an [place]" -has had sig weight loss s/p gastric bypass surgery  ROS: See pertinent positives and negatives per HPI.  Past Medical History  Diagnosis Date  . Headache(784.0)     tension or sinus  . Anxiety and depression   . Right knee pain     hx meniscal injury, chondromalacia, OA  . Dysmenorrhea     hx endometriosis and fibroid  . Diabetes     resolved with gastric bypass  . History of MRSA infection prior to 2012    lasted 5-6 yr.  . Dental crowns present   . Jaw snapping     states jaw pops  . Complication of anesthesia     has been hard to wake up post-op  . DVT of lower extremity (deep venous thrombosis) 2012    LLE    Past Surgical History  Procedure Laterality Date  . Hand surgery Left     X 3 - spider bite and MRSA infection  . Wrist surgery Right 2005  . Knee arthroscopy Right 12/29/2012    Procedure: RIGHT KNEE ARTHROSCOPY  PARTIAL MEDIAL AND LATERAL MENISECTOMY WITH CHONDROPLASTY;  Surgeon: Hessie Dibble, MD;  Location: [facility];  Service: Orthopedics;  Laterality: Right;  . Tonsillectomy  as child  . Cholecystectomy  1990's  . Gastric roux-en-y N/A 11/02/2013    Procedure: LAPAROSCOPIC ROUX-EN-Y GASTRIC BYPASS WITH UPPER ENDOSCOPY;  Surgeon: Gayland Curry, MD;  Location: [facility];  Service: General;  Laterality: N/A;  . Pelvic laparoscopy  1990's    d/t endometriosis  . Dilitation & currettage/hystroscopy  with novasure ablation N/A 05/30/2014    Procedure: DILATATION & CURETTAGE/HYSTEROSCOPY WITH attempted Anthony and with IUD removal ;  Surgeon: Jamey Reas de Berton Lan, MD;  Location: [facility];  Service: Gynecology;  Laterality: N/A;  . Hysteroscopy with resectoscope N/A 05/30/2014    Procedure: HYSTEROSCOPY WITH RESECTOSCOPE;  Surgeon: Jamey Reas de Berton Lan, MD;  Location: [facility];  Service: Gynecology;  Laterality: N/A;    Family History  Problem Relation Age of Onset  . Diabetes Mother     Living  . Kidney disease Mother   . Hypertension Mother   . Cancer Mother     breast cancer  . Breast cancer Mother 67    dx'd with breast ca x2  . Cancer Maternal Grandmother   . Diabetes Maternal Grandmother 34    dec  . Breast cancer Maternal Grandmother   . Diabetes Maternal Grandfather   . Stroke Maternal Grandfather   . Arthritis Father   . Healthy Sister     x2  . Healthy Daughter     Social History   Social History  . Marital Status: Single    Spouse Name: N/A  . Number of Children: N/A  . Years of Education: N/A   Social History Main Topics  . Smoking status: Never Smoker   . Smokeless tobacco: Never Used  .  Alcohol Use: 0.6 oz/week    1 Standard drinks or equivalent per week     Comment: 1-2 glasses of wine a few times per month  . Drug Use: No  . Sexual Activity:    Partners: Female   Other Topics Concern  . 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  acetaminophen (TYLENOL) 500 MG tablet, Take 1,000 mg by mouth every 6 (six) hours as needed for mild pain., Disp: , Rfl:  .  alprazolam (XANAX) 2 MG tablet, Take 1-2 mg by mouth 2 (two) times daily as  needed for anxiety. , Disp: , Rfl:  .  Biotin 1000 MCG tablet, Take 1,000 mcg by mouth 3 (three) times daily., Disp: , Rfl:  .  Calcium Carb-Ergocalciferol 250-125 MG-UNIT TABS, Take 1 tablet by mouth., Disp: , Rfl:  .  Calcium Citrate (CAL-CITRATE PO), Take by mouth., Disp: , Rfl:  .  Diclofenac Sodium (PENNSAID) 2 % SOLN, Place 2 application onto the skin 2 (two) times daily., Disp: 112 g, Rfl: 3 .  DULoxetine (CYMBALTA) 60 MG capsule, Take 120 mg by mouth every morning. , Disp: , Rfl:  .  ELIGEN B12 1000-100 MCG-MG TABS, TAKE 1 TABLET BY MOUTH DAILY., Disp: 30 tablet, Rfl: 6 .  EQL NATURAL ZINC 50 MG TABS, Take by mouth., Disp: , Rfl:  .  FOLIC ACID PO, Take by mouth., Disp: , Rfl:  .  mometasone (ELOCON) 0.1 % ointment, , Disp: , Rfl:  .  Multiple Vitamin (MULTIVITAMIN) tablet, Take 1 tablet by mouth daily., Disp: , Rfl:  .  nortriptyline (PAMELOR) 10 MG/5ML solution, Take 10 mLs (20 mg total) by mouth at bedtime for 3 days, then increase to 15 mL (30mg ) at bedtime x 2 weeks, then increase to 103mL (40mg )., Disp: 500 mL, Rfl: 5 .  ondansetron (ZOFRAN) 4 MG tablet, TAKE 1 TABLET (4 MG TOTAL) BY MOUTH EVERY 8 (EIGHT) HOURS AS NEEDED FOR NAUSEA OR VOMITING., Disp: 9 tablet, Rfl: 0 .  Rice Bran OIL, Take by mouth., Disp: , Rfl:   EXAM:  Filed Vitals:   04/12/15 0847  BP: 110/64  Pulse: 73  Temp: 98 F (36.7 C)    Body mass index is 26.11 kg/(m^2).  GENERAL: vitals reviewed and listed above, alert, oriented, appears well hydrated and in no acute distress  HEENT: atraumatic, conjunttiva clear, no obvious abnormalities on inspection of external nose and ears  NECK: no obvious masses on inspection  LUNGS: clear to auscultation bilaterally, no wheezes, rales or rhonchi, good air movement  CV: HRRR, no peripheral edema  MS: moves all extremities without noticeable abnormality  PSYCH: pleasant and cooperative, no obvious depression or anxiety  ASSESSMENT AND PLAN:  Discussed the  following assessment and plan:  Abnormal thyroid exam  -no findings on exam today, will get [place] thryoid -flu vaccine -Patient advised to return or notify a doctor immediately if symptoms worsen or persist or new concerns arise.  There are no Patient Instructions on file for this visit.   Colin Benton R.

## 2015-04-12 NOTE — Patient Instructions (Signed)
BEFORE YOU LEAVE: -flu vaccine  -We placed a referral for you as discussed for an ultrasound of your thyroid. It usually takes about 1-2 weeks to process and schedule this referral. If you have not heard from [place] regarding this appointment in 2 weeks please contact our office.

## 2015-04-14 ENCOUNTER — Ambulatory Visit
Admission: RE | Admit: 2015-04-14 | Discharge: 2015-04-14 | Disposition: A | Discharge status: Home / Self Care | Payer: BLUE CROSS/BLUE SHIELD | Source: Ambulatory Visit | Attending: Family Medicine | Admitting: Family Medicine

## 2015-04-14 DIAGNOSIS — R946 Abnormal results of thyroid function studies: Secondary | ICD-10-CM

## 2015-04-18 ENCOUNTER — Encounter: Payer: Self-pay | Admitting: Family Medicine

## 2015-04-18 ENCOUNTER — Other Ambulatory Visit: Payer: Self-pay | Admitting: Family Medicine

## 2015-04-18 DIAGNOSIS — E049 Nontoxic goiter, unspecified: Secondary | ICD-10-CM

## 2015-04-20 ENCOUNTER — Encounter: Payer: Self-pay | Admitting: Family Medicine

## 2015-04-24 ENCOUNTER — Other Ambulatory Visit (HOSPITAL_COMMUNITY)
Admission: RE | Admit: 2015-04-24 | Discharge: 2015-04-24 | Disposition: A | Discharge status: Home / Self Care | Payer: BLUE CROSS/BLUE SHIELD | Source: Ambulatory Visit | Attending: Endocrinology | Admitting: Endocrinology

## 2015-04-24 ENCOUNTER — Ambulatory Visit (INDEPENDENT_AMBULATORY_CARE_PROVIDER_SITE_OTHER): Payer: BLUE CROSS/BLUE SHIELD | Admitting: Endocrinology

## 2015-04-24 ENCOUNTER — Encounter: Payer: Self-pay | Admitting: Endocrinology

## 2015-04-24 VITALS — BP 110/76 | HR 68 | Temp 97.7°F | Ht 68.0 in | Wt 171.0 lb

## 2015-04-24 DIAGNOSIS — E042 Nontoxic multinodular goiter: Secondary | ICD-10-CM | POA: Diagnosis not present

## 2015-04-24 DIAGNOSIS — E041 Nontoxic single thyroid nodule: Secondary | ICD-10-CM | POA: Diagnosis present

## 2015-04-24 NOTE — Patient Instructions (Signed)
We'll let you know about the biopsy results.  most of the time, a "lumpy thyroid" will eventually become overactive.  this is usually a slow process, happening over the span of many years.  Please come back for a follow-up appointment in 6 months.

## 2015-04-24 NOTE — Progress Notes (Signed)
Subjective:    Patient ID: [name], female    DOB: 07-Dec-1970, 44 y.o.   MRN: [id number]  HPI Pt was incidentally noted to have a goiter, on carotid US.  she is unaware of ever having had thyroid problems in the past.  He has no h/o XRT or surgery to the neck.  She has slight choking sensation, but no assoc pain.   Past Medical History  Diagnosis Date  . Headache(784.0)     tension or sinus  . Anxiety and depression   . Right knee pain     hx meniscal injury, chondromalacia, OA  . Dysmenorrhea     hx endometriosis and fibroid  . Diabetes     resolved with gastric bypass  . History of MRSA infection prior to 2012    lasted 5-6 yr.  . Dental crowns present   . Jaw snapping     states jaw pops  . Complication of anesthesia     has been hard to wake up post-op  . DVT of lower extremity (deep venous thrombosis) 2012    LLE    Past Surgical History  Procedure Laterality Date  . Hand surgery Left     X 3 - spider bite and MRSA infection  . Wrist surgery Right 2005  . Knee arthroscopy Right 12/29/2012    Procedure: RIGHT KNEE ARTHROSCOPY  PARTIAL MEDIAL AND LATERAL MENISECTOMY WITH CHONDROPLASTY;  Surgeon: Hessie Dibble, MD;  Location: [facility];  Service: Orthopedics;  Laterality: Right;  . Tonsillectomy  as child  . Cholecystectomy  1990's  . Gastric roux-en-y N/A 11/02/2013    Procedure: LAPAROSCOPIC ROUX-EN-Y GASTRIC BYPASS WITH UPPER ENDOSCOPY;  Surgeon: Gayland Curry, MD;  Location: [facility];  Service: General;  Laterality: N/A;  . Pelvic laparoscopy  1990's    d/t endometriosis  . Dilitation & currettage/hystroscopy with novasure ablation N/A 05/30/2014    Procedure: DILATATION & CURETTAGE/HYSTEROSCOPY WITH attempted [place] and with IUD removal ;  Surgeon: Jamey Reas de Berton Lan, MD;  Location: [facility];  Service: Gynecology;  Laterality: N/A;  . Hysteroscopy with resectoscope N/A 05/30/2014    Procedure: HYSTEROSCOPY WITH  RESECTOSCOPE;  Surgeon: Jamey Reas de Berton Lan, MD;  Location: [facility];  Service: Gynecology;  Laterality: N/A;    Social History   Social History  . Marital Status: Single    Spouse Name: N/A  . Number of Children: N/A  . Years of Education: N/A   Occupational History  . Not on file.   Social History Main Topics  . Smoking status: Never Smoker   . Smokeless tobacco: Never Used  . Alcohol Use: 0.6 oz/week    1 Standard drinks or equivalent per week     Comment: 1-2 glasses of wine a few times per month  . Drug Use: No  . Sexual Activity:    Partners: Female   Other Topics Concern  . 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 acetaminophen (TYLENOL) 500 MG tablet Take 1,000 mg by mouth every 6 (six) hours as needed for mild pain.    Marland Kitchen alprazolam (XANAX) 2 MG tablet Take 1-2 mg by mouth 2 (two) times daily as needed for anxiety.     . Biotin 1000 MCG tablet Take 1,000 mcg by mouth 3 (three) times daily.    . Calcium Carb-Ergocalciferol 250-125 MG-UNIT TABS Take 1 tablet by mouth.    . Calcium Citrate (CAL-CITRATE PO) Take by mouth.    . Diclofenac Sodium (PENNSAID) 2 % SOLN Place 2 application onto the skin 2 (two) times daily. 112 g 3  . DULoxetine (CYMBALTA) 60 MG capsule Take 120 mg by mouth every morning.     . mometasone (ELOCON) 0.1 % ointment     . Multiple Vitamin (MULTIVITAMIN) tablet Take 1 tablet by mouth daily.    . nortriptyline (PAMELOR) 10 MG/5ML solution Take 10 mLs (20 mg total) by mouth at bedtime for 3 days, then increase to 15 mL (30mg ) at bedtime x 2 weeks, then increase to 58mL (40mg ). 500 mL 5  .  ondansetron (ZOFRAN) 4 MG tablet TAKE 1 TABLET (4 MG TOTAL) BY MOUTH EVERY 8 (EIGHT) HOURS AS NEEDED FOR NAUSEA OR VOMITING. 9 tablet 0  . ELIGEN B12 1000-100 MCG-MG TABS TAKE 1 TABLET BY MOUTH DAILY. (Patient not taking: Reported on 04/24/2015) 30 tablet 6  . EQL NATURAL ZINC 50 MG TABS Take by mouth.    . FOLIC ACID PO Take by mouth.    . Rice Bran OIL Take by mouth.     No current facility-administered medications on file prior to visit.    Allergies  Allergen Reactions  . Other Hives  . Sulfa Antibiotics Other (See Comments)    HALLUCINATIONS, FEVER, CHILLS  . Bactrim [Sulfamethoxazole-Trimethoprim] Other (See Comments)    Hallucinations, fever chills    Family History  Problem Relation Age of Onset  . Diabetes Mother     Living  . Kidney disease Mother   . Hypertension Mother   . Cancer Mother     breast cancer  . Breast cancer Mother 26    dx'd with breast ca x2  . Cancer Maternal Grandmother   . Diabetes Maternal Grandmother 97    dec  . Breast cancer Maternal Grandmother   . Diabetes Maternal Grandfather   . Stroke Maternal Grandfather   . Arthritis Father   . Healthy Sister     x2  . Healthy Daughter   . Thyroid disease Neg Hx     BP 110/76 mmHg  Pulse 68  Temp(Src) 97.7 F (36.5 C) (Oral)  Ht 5\' 8"  (1.727 m)  Wt 171 lb (77.565 kg)  BMI 26.01 kg/m2  SpO2 98%   Review of Systems denies headache, hoarseness, double vision, palpitations, sob, diarrhea, polyuria, myalgias, and easy bruising.  She has acral numbness, rhinorrhea, anxiety, and night sweats.  She has lost a few lbs.  She has infrequent menses.      Objective:   Physical Exam VS: see vs page GEN: no distress HEAD: head: no deformity eyes: no periorbital swelling, no proptosis external nose and ears are normal mouth: no lesion seen NECK: the right thyroid nodule is palpable, and freely mobile.   CHEST WALL: no deformity LUNGS:  Clear to auscultation CV: reg rate and rhythm, no  murmur MUSCULOSKELETAL: muscle bulk and strength are grossly normal.  no obvious joint swelling.  gait is normal and steady  EXTEMITIES: no deformity.  no edema NEURO:  cn 2-12 grossly intact.   readily moves all 4's.  sensation is intact to touch on all 4's.  SKIN:  Normal texture and temperature.  No rash or suspicious lesion is visible.   NODES:  None palpable at the neck PSYCH: alert, well-oriented.  Does not appear anxious nor depressed.   Radiol: thyroid US (04/14/15): Multiple bilateral thyroid nodules. The imaging appearance is most suggestive of multinodular goiter.  the 2.2 cm solid nodule in the right mid gland with thin internal dystrophic calcification meets consensus criteria for further evaluation  Lab Results  Component Value Date   TSH 0.925 08/02/2014   T4TOTAL 9.6 08/03/2013    thyroid needle bx: consent obtained, signed form on chart The area is first sprayed with cooling agent local: xylocaine 2%, with epinephrine prep: alcohol pad 4 bxs are done with 25 and 82N needles no complications     Assessment & Plan:  Multinodular goiter, new.  Patient is advised the following: Patient Instructions  We'll let you know about the biopsy results.  most of the time, a "lumpy thyroid" will eventually become overactive.  this is usually a slow process, happening over the span of many years.  Please come back for a follow-up appointment in 6 months.

## 2015-05-01 ENCOUNTER — Encounter: Payer: Self-pay | Admitting: Family Medicine

## 2015-05-01 ENCOUNTER — Encounter: Payer: Self-pay | Admitting: Neurology

## 2015-05-11 ENCOUNTER — Encounter: Payer: Self-pay | Admitting: Neurology

## 2015-05-11 ENCOUNTER — Ambulatory Visit (INDEPENDENT_AMBULATORY_CARE_PROVIDER_SITE_OTHER): Payer: BLUE CROSS/BLUE SHIELD | Admitting: Neurology

## 2015-05-11 VITALS — BP 140/80 | HR 71 | Ht 68.0 in | Wt 175.1 lb

## 2015-05-11 DIAGNOSIS — R202 Paresthesia of skin: Secondary | ICD-10-CM | POA: Diagnosis not present

## 2015-05-11 DIAGNOSIS — M792 Neuralgia and neuritis, unspecified: Secondary | ICD-10-CM

## 2015-05-11 DIAGNOSIS — R209 Unspecified disturbances of skin sensation: Secondary | ICD-10-CM | POA: Diagnosis not present

## 2015-05-11 NOTE — Progress Notes (Signed)
Follow-up Visit   Date: 05/11/2015    [name] MRN: [id number] DOB: 08/21/1970   Interim History: [name] is a 44 y.o. right-handed Caucasian female with depression/anxiety and prior obesity s/p gastric bypass (March 2015, lost 120lb+), and arthritis of the knees returning to the clinic for follow-up of left foot drop and generalized paresthesias.  The patient was accompanied to the clinic by self.  History of present illness: Around late September 2015, she developed numbness of her 4th toe which progressed to involve the dorsum of the foot over a few weeks.  Soon, she noticed tingling/numbness over the left lateral lower leg.  Early December, she developed similar numbness/tingling over the right leg (toes, dorsum of the foot, and lateral leg).  Within a week, left lateral thigh started feeling different, she developed burning pain of her left > right hand and forearm, and noted numbness of the nose and lips.  She began having difficulty with walking due to left foot weakness, as if she is tripping over her own toes and is falling about once every other day. She endorses dry eyes and dry mouth. Her taste has also been more bland lately.  She was diabetic from 2011-2015 with HbA1c ranging ~8.4% but this resolved after her weight loss surgery.    She has been seeing Dr. Tamala Julian with Sports Medicine for ongoing problems with her knees and during his evaluation in early December, she had raised these new symptoms to him and her exam showed new left foot drop.  MRI of the lumbar spine wo contrast was ordered and did not show any nerve root impingement. She was given a trial of prednisone taper over one week, and she denies any significant change.  UPDATE 08/22/2014:  Patient is here to discuss the results of testing to-date, including EMG, CSF, imaging, and serology testing.  Notable findings include subacute bilateral common peroneal mononeuropathy.  Serology testing and CSF has  been normal.  She feels that her hands started burning and tingling which was severe a few days ago. Her legs are stable, without any worsening.  She continues to have numbness of the face especially nose.    UPDATE 10/21/2014:  She continues to have burning sensations of the hands and feet and has not tried taking gabapentin yet.  No weakness of the hands.  Her left foot weakness is slowly improving, especially with her toes.  She still has difficulty raising the left foot.    UPDATE 12/26/2014:  She tried taking gabapentin, but this made her very sleepy so stopped it.   She reports doing well until three weeks ago, then developed worsening paresthesias of her hands and slight worsening of her foot drop.  She is dropping things and has difficulty opening jars.  Additionally, she feels as if her mouth is also tingling and has two episodes of swallowing difficulty.    UPDATE 02/23/2015:  There has been worsening painful paresthesias of the hands, legs, face, lips, nose, and cheeks. She had new onset of bifrontal headaches, pressure-like which started three weeks ago and has been constant since then.  She has blurred vision, transient binocular vision loss x 3 where it went black and once where it went white. This lasted only a few seconds.  She has associated photophobia.  No phonophobia, nausea, vomiting.  She tried tylenol which does not help.  She is unable to take NSAIDs because of her gastric bypass.    UPDATE 05/11/2015:  She has since underwent  skin biopsy, surface echocardiogram, and [place] carotis which returned normal. She has been taking nortriptyline 182m at bedtime for sometime, but stopped it several weeks ago because she was feeling better and noticed that symptoms began to worsen again.  She has a number of complains including persistent burning pain of her mouth, face, hands, and legs and today, reports that she is now sleeping with her mouth open.  Despite me trying to explain that sleeping with  mouth open can be normal, she feels that this is new and very concerned. She was also very worried about chronic lyme, but CSF lyme has been negative.  [facility]    Medications:  Current Outpatient Prescriptions on File Prior to Visit  Medication Sig Dispense Refill  . acetaminophen (TYLENOL) 500 MG tablet Take 1,000 mg by mouth every 6 (six) hours as needed for mild pain.    .Marland Kitchenalprazolam (XANAX) 2 MG tablet Take 1-2 mg by mouth 2 (two) times daily as needed for anxiety.     . Biotin 1000 MCG tablet Take 1,000 mcg by mouth 3 (three) times daily.    . Calcium Carb-Ergocalciferol 250-125 MG-UNIT TABS Take 1 tablet by mouth.    . Calcium Citrate (CAL-CITRATE PO) Take by mouth.    . Diclofenac Sodium (PENNSAID) 2 % SOLN Place 2 application onto the skin 2 (two) times daily. 112 g 3  . DULoxetine (CYMBALTA) 60 MG capsule Take 120 mg by mouth every morning.     .Marland KitchenELIGEN B12 1000-100 MCG-MG TABS TAKE 1 TABLET BY MOUTH DAILY. 30 tablet 6  . EQL NATURAL ZINC 50 MG TABS Take by mouth.    . FOLIC ACID PO Take by mouth.    . mometasone (ELOCON) 0.1 % ointment     . Multiple Vitamin (MULTIVITAMIN) tablet Take 1 tablet by mouth daily.    . nortriptyline (PAMELOR) 10 MG/5ML solution Take 10 mLs (20 mg total) by mouth at bedtime for 3 days, then increase to 15 mL (339m at bedtime x 2 weeks, then increase to 2088m52m70m500 mL 5  . ondansetron (ZOFRAN) 4 MG tablet TAKE 1 TABLET (4 MG TOTAL) BY MOUTH EVERY 8 (EIGHT) HOURS AS NEEDED FOR NAUSEA OR VOMITING. 9 tablet 0  . Rice Bran OIL Take by mouth.     No current facility-administered medications on file prior to visit.    Allergies:  Allergies  Allergen Reactions  . Other Hives  . Sulfa Antibiotics Other (See Comments)    HALLUCINATIONS, FEVER, CHILLS  . Bactrim [Sulfamethoxazole-Trimethoprim] Other (See Comments)    Hallucinations, fever chills    Review of Systems:    CONSTITUTIONAL: No fevers, chills, night sweats.  EYES: +visual changes or eye pain ENT: No hearing changes.  No history of nose bleeds.   RESPIRATORY: No cough, wheezing and shortness of breath.   CARDIOVASCULAR: Negative for chest pain, and palpitations.   GI: Negative for abdominal discomfort, blood in stools or black stools.  No recent change in bowel habits.   GU:  No history of incontinence.   MUSCLOSKELETAL: No history of joint pain or swelling.  No myalgias.   SKIN: Negative for lesions, rash, and itching.   ENDOCRINE: Negative for cold or heat intolerance, polydipsia or goiter.   PSYCH:  +depression or anxiety symptoms.   NEURO: As Above.   Vital Signs:  BP 140/80 mmHg  Pulse 71  Ht _0  (1.727 m)  Wt 175 lb 1 oz (79.408 kg)  BMI 26.62 kg/m2  SpO2 99%  Neurological Exam: MENTAL STATUS including orientation to time, place, person, recent and remote memory, attention span and concentration, language, and fund of knowledge is normal.  Speech is not dysarthric.  CRANIAL NERVES:  No visual field defects.  Pupils equal round and reactive to light.  Normal conjugate, extra-ocular eye movements in all directions of gaze.  No ptosis. Normal facial sensation.  Face is symmetric, there is no facial weakness. Palate elevates symmetrically.  Tongue is midline.  MOTOR:  No atrophy, fasciculations or abnormal movements.  No pronator drift.  Tone is normal.    Right Upper Extremity:    Left Upper Extremity:    Deltoid  5/5   Deltoid  5/5   Biceps  5/5   Biceps  5/5   Triceps  5/5   Triceps  5/5   Wrist extensors  5/5   Wrist extensors  5/5   Wrist flexors  5/5   Wrist flexors  5/5   Finger extensors  5/5   Finger extensors  5/5   Finger flexors  5/5   Finger flexors  5/5   Dorsal interossei  5-/5   Dorsal interossei  5-/5   Abductor pollicis  5/5   Abductor pollicis  5/5   Tone (Ashworth scale)  0  Tone (Ashworth scale)  0   Right Lower Extremity:    Left Lower Extremity:    Hip  flexors  5/5   Hip flexors  5/5   Hip extensors  5/5   Hip extensors  5/5   Knee flexors  5/5   Knee flexors  5/5   Knee extensors  5/5   Knee extensors  5/5   Inversion 5/5  Inversion 5/5  Eversion 5/5  Eversion 5/5  Dorsiflexors  5/5   Dorsiflexors  5/5   Plantarflexors  5/5   Plantarflexors  5/5   Toe extensors  5/5   Toe extensors  5-/5   Toe flexors  5/5   Toe flexors  5/5   Tone (Ashworth scale)  0  Tone (Ashworth scale)  0   MSRs:  Right                                                                 Left brachioradialis 2+  brachioradialis 2+  biceps 2+  biceps 2+  triceps 2+  triceps 2+  patellar 3+  patellar 3+  ankle jerk 2+  ankle jerk 2+  Hoffman no  Hoffman no  plantar response down  plantar response down   SENSORY:  Reduced sensation to all modalities over the left >> right lateral lower leg and dorsum of the foot.  COORDINATION/GAIT:  Normal finger-to- nose-finger. Gait appears stable.  Able to walk on toes and heels (improved)   Data:  EMG upper extremities 01/05/2015: 1. Right median neuropathy at or distal to the wrist, consistent with the clinical diagnosis of carpal tunnel syndrome. Overall, these findings are mild in degree electrically. 2. There is no evidence of a sensorimotor polyneuropathy, cervical radiculopathy or polyradiculoneuropathy affecting the upper extremities. A small fiber neuropathy cannot be excluded by this study.  MRI brain wwo contrast 07/29/2014:  Several punctate foci of white matter T2  signal abnormality, nonspecific and not necessarily greater than expected for patient's age.   MRI cervical spine wwo contrast 07/29/2014:  Negative for demyelinating disease. The cervical cord is normal in appearance. Shallow disc bulge C6-7 without central canal or foraminal narrowing.  MRI lumbar spine wo contrast 07/22/2014: Minor lumbar spondylosis. No compressive lesion. No intraspinal abnormality.  EMG 07/28/2014 left arm and bilateral lower  extremities: 3. There is electrophysiologic evidence of an asymmetric acute common peroneal neuropathy at the fibular head affecting bilateral lower extremities, predominately demyelinating with secondary axon loss. These findings are severe in degree electrically on the left, and mild on the right. 4. A superimposed L5 radiculopathy cannot be excluded.  CSF 07/31/2014:   R 0 W2 G50 P33  IgG index 0.52, no OCB, ACE 8 VZV not detected, CSF cytology negative, cultures negative, CSF Lyme neg, HSV neg, myelin basic protein neg  Serum 07/2014:  ANA negative, ACE 21, CK 30, TSH 1.75, ESR 18, c/p-ANCA neg, MPO antibody neg, proteinase-3 antibody neg, SPEP/UPEP with IFE no M protein, Hepatitis acute panel neg, TSH 0.925, vitamin B12 733, ceruloplasmin 29, CRP <0.5, RF <10, ANA neg, ENA neg, copper 129, cryoglobulin negative, selenium 130  Labs 08/22/2014:  Zinc 44*, vitamin B6 18.8, HIV NR, lead negative, vitamin E 0.6 Labs 12/26/2014:  Zinc 72, vitamin B12 958, ESR 6, CRP <0.5  MRI brain wwo contrast 03/03/2015:  Minimal punctate nonspecific white matter type changes without change as detailed above.  S[facility] biopsy performed at [facility]:  Normal   Echo 03/20/2015:  Normal   US carotids 04/03/2015:  Normal, incidental collection of blood vessels involving the thyroid gland  IMPRESSION: Ms. Meroney is a 44 year-old female returning for evaluation of a progressive paresthesias (legs > arms > face).   When she initially came to see me, she was diagnosed with left common peroneal neuropathy in the setting of >120lb weight loss after gastric bypass surgery, thought to be due to loss of fat pad at the knee.  Her motor strength has significantly improved with PT alone.  Since this time, she has persistent whole body burning paresthesias, which are non-anatomical.  She had underwent exhaustive work-up which has largely returned normal including MRI brain, c-spine, lumbar spine, CSF testing, EMG, skin biopsy, and  serology testing. Only abnormality on labs was low zinc level.  She was supplemented for several months.  She had also had US carotids and echocardiogram due to transient visual symptoms, which again is normal.  I reassured her CSF testing for lyme was negative.  Second opinion at [facility] also did not find evidence of small fiber neuropathy and was nondiagnostic.  At this juncture, I recommend symptomatic management of her pain.  She should continue nortriptyline 143m at bedtime.  She is already taking cymbalta 1215mdaily for depression, which is also helpful for neuropathic pain.  We discussed alternative diagnosis including somatization chronic pain syndrome and she may need pain management going forward.    I understand that she is frustrated at the lack of answers, but I do not have any further recommendations as her neurological evaluation has been exhaustive. [facility]     The duration of this appointment visit was 30 minutes of face-to-face time with the patient.  Greater than 50% of this time was spent in counseling, explanation of diagnosis, planning of further management, and coordination of care.   Thank you for allowing me  to participate in patient's care.  If I can answer any additional questions, I would be pleased to do so.    Sincerely,    Donika K. Posey Pronto, DO

## 2015-05-11 NOTE — Patient Instructions (Addendum)
1.  Continue nortriptyline 100mg  at bedtime 2.  We have done an exhaustive work-up without any identifiable cause for your symptoms.  If you choose you have a referral to [facility], please let me know.

## 2015-05-19 ENCOUNTER — Ambulatory Visit (INDEPENDENT_AMBULATORY_CARE_PROVIDER_SITE_OTHER): Payer: BLUE CROSS/BLUE SHIELD | Admitting: Obstetrics and Gynecology

## 2015-05-19 ENCOUNTER — Encounter: Payer: Self-pay | Admitting: Obstetrics and Gynecology

## 2015-05-19 VITALS — BP 120/72 | HR 60 | Resp 16 | Ht 67.5 in | Wt 176.0 lb

## 2015-05-19 DIAGNOSIS — Z01419 Encounter for gynecological examination (general) (routine) without abnormal findings: Secondary | ICD-10-CM

## 2015-05-19 DIAGNOSIS — N921 Excessive and frequent menstruation with irregular cycle: Secondary | ICD-10-CM

## 2015-05-19 DIAGNOSIS — Z113 Encounter for screening for infections with a predominantly sexual mode of transmission: Secondary | ICD-10-CM | POA: Diagnosis not present

## 2015-05-19 LAB — CBC
HEMATOCRIT: 37.2 % (ref 36.0–46.0)
Hemoglobin: 12.3 g/dL (ref 12.0–15.0)
MCH: 30.2 pg (ref 26.0–34.0)
MCHC: 33.1 g/dL (ref 30.0–36.0)
MCV: 91.4 fL (ref 78.0–100.0)
MPV: 9.7 fL (ref 8.6–12.4)
PLATELETS: 338 10*3/uL (ref 150–400)
RBC: 4.07 MIL/uL (ref 3.87–5.11)
RDW: 13 % (ref 11.5–15.5)
WBC: 7.8 10*3/uL (ref 4.0–10.5)

## 2015-05-19 NOTE — Progress Notes (Signed)
Patient ID: [name], female   DOB: Sep 04, 1970, 44 y.o.   MRN: [id number] 44 y.o. [id number] Single Caucasian female here for annual exam.    Menses are now irregular following endometrial ablation.  Can occur every 4 - 8 weeks.  Menses are heavy and 5 days long.  Unable to take NSAIDs due to gastric bypass surgery.  Told she can only take liquid Tylenol. Lost a total of 125 pounds.   Used Mirena IUD prior to endometrial ablation and had bleeding.  Having neuropathy in hands, feet, and face.  Seeing Neurology and work up is negative.  Has Rx for Pamelor.  Also diagnosed with thyroid lumps and had a biopsy which was negative for malignancy.   Daughter just started middle school.  Patient is in a new relationship.   PCP: Colin Benton, DO   No LMP recorded.          Sexually active: Yes.  female partner  The current method of family planning is None--Same sex partner. History of ablation 10/2014.    Exercising: Yes.    cycling, walking and swimming. Smoker:  no  Health Maintenance: Pap:  04-29-14 Neg:Neg HR HPV History of abnormal Pap:  no MMG:  07-04-14 Density Cat.B/Neg/BiRads1:[facility]. Colonoscopy:  n/a BMD:   n/a  Result  n/a TDaP:  04-09-13 Screening Labs:  Hb today: PCP, Urine today: not done   reports that she has never smoked. She has never used smokeless tobacco. She reports that she drinks about 0.6 oz of alcohol per week. She reports that she does not use illicit drugs.  Past Medical History  Diagnosis Date  . Headache(784.0)     tension or sinus  . Anxiety and depression   . Right knee pain     hx meniscal injury, chondromalacia, OA  . Dysmenorrhea     hx endometriosis and fibroid  . Diabetes ([place])     resolved with gastric bypass  . History of MRSA infection prior to 2012    lasted 5-6 yr.  . Dental crowns present   . Jaw snapping     states jaw pops  . Complication of anesthesia     has been hard to wake up post-op  . DVT of lower extremity  (deep venous thrombosis) ([place]) 2012    LLE  . Neuropathy ([place]) 2015    hands, feet and face--[place] neurology  . Left foot drop 2016    --[place] neurology    Past Surgical History  Procedure Laterality Date  . Hand surgery Left     X 3 - spider bite and MRSA infection  . Wrist surgery Right 2005  . Knee arthroscopy Right 12/29/2012    Procedure: RIGHT KNEE ARTHROSCOPY  PARTIAL MEDIAL AND LATERAL MENISECTOMY WITH CHONDROPLASTY;  Surgeon: Hessie Dibble, MD;  Location: [facility];  Service: Orthopedics;  Laterality: Right;  . Tonsillectomy  as child  . Cholecystectomy  1990's  . Gastric roux-en-y N/A 11/02/2013    Procedure: LAPAROSCOPIC ROUX-EN-Y GASTRIC BYPASS WITH UPPER ENDOSCOPY;  Surgeon: Gayland Curry, MD;  [facility];  Service: General;  Laterality: N/A;  . Pelvic laparoscopy  1990's    d/t endometriosis  . Dilitation & currettage/hystroscopy with novasure ablation N/A 05/30/2014    Procedure: DILATATION & CURETTAGE/HYSTEROSCOPY WITH attempted [place] and with IUD removal ;  Surgeon: Jamey Reas de Berton Lan, MD;  Location: [facility];  Service: Gynecology;  Laterality: N/A;  . Hysteroscopy with resectoscope  N/A 05/30/2014    Procedure: HYSTEROSCOPY WITH RESECTOSCOPE;  Surgeon: Jamey Reas de Berton Lan, MD;  Location: [facility];  Service: Gynecology;  Laterality: N/A;    Current Outpatient Prescriptions  Medication Sig Dispense Refill  . acetaminophen (TYLENOL) 500 MG tablet Take 1,000 mg by mouth every 6 (six) hours as needed for mild pain.    Marland Kitchen alprazolam (XANAX) 2 MG tablet Take 1-2 mg by mouth 2 (two) times daily as needed for anxiety.     . DULoxetine (CYMBALTA) 60 MG capsule Take 120 mg by mouth every morning.     Marland Kitchen ELIGEN B12 1000-100 MCG-MG TABS TAKE 1 TABLET BY MOUTH DAILY. 30 tablet 6  . Multiple Vitamin (MULTIVITAMIN) tablet Take 1 tablet by mouth daily.    . nortriptyline (PAMELOR) 10 MG/5ML solution Take 10 mLs (20  mg total) by mouth at bedtime for 3 days, then increase to 15 mL (30mg ) at bedtime x 2 weeks, then increase to 47mL (40mg ). 500 mL 5  . ondansetron (ZOFRAN) 4 MG tablet TAKE 1 TABLET (4 MG TOTAL) BY MOUTH EVERY 8 (EIGHT) HOURS AS NEEDED FOR NAUSEA OR VOMITING. 9 tablet 0  . Rice Bran OIL Take by mouth.     No current facility-administered medications for this visit.    Family History  Problem Relation Age of Onset  . Diabetes Mother     Living  . Kidney disease Mother   . Hypertension Mother   . Cancer Mother     breast cancer  . Breast cancer Mother 49    dx'd with breast ca x2  . Cancer Maternal Grandmother   . Diabetes Maternal Grandmother 59    dec  . Breast cancer Maternal Grandmother   . Diabetes Maternal Grandfather   . Stroke Maternal Grandfather   . Arthritis Father   . Healthy Sister     x2  . Healthy Daughter   . Thyroid disease Neg Hx     ROS:  Pertinent items are noted in HPI.  Otherwise, a comprehensive ROS was negative.  Exam:   BP 120/72 mmHg  Pulse 60  Resp 16  Ht 5' 7.5" (1.715 m)  Wt 176 lb (79.833 kg)  BMI 27.14 kg/m2    General appearance: alert, cooperative and appears stated age Head: Normocephalic, without obvious abnormality, atraumatic Neck: no adenopathy, supple, symmetrical, trachea midline and thyroid normal to inspection and palpation Lungs: clear to auscultation bilaterally Breasts: normal appearance, no masses or tenderness, Inspection negative, No nipple retraction or dimpling, No nipple discharge or bleeding, No axillary or supraclavicular adenopathy Heart: regular rate and rhythm Abdomen: soft, non-tender; bowel sounds normal; no masses,  no organomegaly Extremities: extremities normal, atraumatic, no cyanosis or edema Skin: Skin color, texture, turgor normal. No rashes or lesions Lymph nodes: Cervical, supraclavicular, and axillary nodes normal. No abnormal inguinal nodes palpated Neurologic: Grossly normal  Pelvic: External  genitalia:  no lesions              Urethra:  normal appearing urethra with no masses, tenderness or lesions              Bartholins and Skenes: normal                 Vagina: normal appearing vagina with normal color and discharge, no lesions              Cervix: no lesions              Pap taken:  No. Bimanual Exam:  Uterus:  normal size, contour, position, consistency, mobility, non-tender              Adnexa: normal adnexa and no mass, fullness, tenderness              Rectovaginal: Yes.  .  Confirms.              Anus:  normal sphincter tone, no lesions  Chaperone was present for exam.  Assessment:   Well woman visit with normal exam. Status post endometrial ablation.  Heavy cycles have returned.  Skipping menses on occasion. Further weight loss, status post gasric bypass. Neuropathy of undetermined etiology.  HX DVT on combined oral contraceptives.  Plan: Yearly mammogram recommended after age 28.  Recommended self breast exam.  Pap and HR HPV as above. Discussed Calcium, Vitamin D, regular exercise program including cardiovascular and weight bearing exercise. Labs performed.  Yes.  .   See orders.  STD screening and CBC. Refills given on medications.  No..    Declines treatment for menorrhagia at this time.  Does not want anything hormonal and declines hysterectomy.  Follow up annually and prn.     After visit summary provided.

## 2015-05-19 NOTE — Patient Instructions (Signed)

## 2015-05-20 LAB — STD PANEL
HIV 1&2 Ab, 4th Generation: NONREACTIVE
Hepatitis B Surface Ag: NEGATIVE

## 2015-05-20 LAB — HEPATITIS C ANTIBODY: HCV Ab: NEGATIVE

## 2015-05-23 LAB — IPS N GONORRHOEA AND CHLAMYDIA BY PCR

## 2015-06-02 ENCOUNTER — Encounter: Payer: Self-pay | Admitting: Family Medicine

## 2015-06-02 ENCOUNTER — Ambulatory Visit (INDEPENDENT_AMBULATORY_CARE_PROVIDER_SITE_OTHER): Payer: BLUE CROSS/BLUE SHIELD | Admitting: Family Medicine

## 2015-06-02 VITALS — BP 138/85 | HR 74 | Temp 98.1°F | Wt 177.0 lb

## 2015-06-02 DIAGNOSIS — R202 Paresthesia of skin: Secondary | ICD-10-CM

## 2015-06-02 DIAGNOSIS — E049 Nontoxic goiter, unspecified: Secondary | ICD-10-CM | POA: Diagnosis not present

## 2015-06-02 DIAGNOSIS — F39 Unspecified mood [affective] disorder: Secondary | ICD-10-CM

## 2015-06-02 LAB — TSH: TSH: 0.64 u[IU]/mL (ref 0.35–4.50)

## 2015-06-02 NOTE — Progress Notes (Signed)
HPI:   [name] is a very pleasant 44 yo F with a PMH of morbid obesity s/p bariatric surgery with rapid and sig weight reduction, anxiety and depression, paresthesias of the hands and feet that started after weight loss and a recent dx of benign thyroid goiter. She is primarily concerned about her ongoing intermittent "tingling" and "burning" sensations in her bilat hands, feet and face. She has had an exhaustive and extensive workup with Dr. Posey Pronto and at [facility] that was all negative except for L common peroneal neuropathy, thought per neurology notes to be "due to loss of fat pad in the knee." She has had MRI of the brain, c-spine and lumbar spine, CSF testing, EMG, skin biopsy and extensive serological testing. She did have low zinc and was on supplementation for this with her neurologist. She is taking nortriptyline and cymbalta whish she feels helps, but she is frustrated at the lack of a dx. She feel that people make think she is "crazy." Admits to stress related to this but no sig depressive or panic type symptoms currently. Not seeing a counselor. She wanted to get my thoughts on pursuing a 3rd opinion at [facility] or and integrative medicine evaluation - which she reports was suggested by her neurologist. She also wonders if her thyroid issue could be contributing to theses symptoms. ROS: See pertinent positives and negatives per HPI.  Past Medical History  Diagnosis Date  . Headache(784.0)     tension or sinus  . Anxiety and depression   . Right knee pain     hx meniscal injury, chondromalacia, OA  . Dysmenorrhea     hx endometriosis and fibroid  . Diabetes ([place])     resolved with gastric bypass  . History of MRSA infection prior to 2012    lasted 5-6 yr.  . Dental crowns present   . Jaw snapping     states jaw pops  . Complication of anesthesia     has been hard to wake up post-op  . DVT of lower extremity (deep venous thrombosis) ([place]) 2012    LLE  . Neuropathy ([place]) 2015   hands, feet and face--[place] neurology  . Left foot drop 2016    --[place] neurology    Past Surgical History  Procedure Laterality Date  . Hand surgery Left     X 3 - spider bite and MRSA infection  . Wrist surgery Right 2005  . Knee arthroscopy Right 12/29/2012    Procedure: RIGHT KNEE ARTHROSCOPY  PARTIAL MEDIAL AND LATERAL MENISECTOMY WITH CHONDROPLASTY;  Surgeon: Hessie Dibble, MD;  Location: [facility];  Service: Orthopedics;  Laterality: Right;  . Tonsillectomy  as child  . Cholecystectomy  1990's  . Gastric roux-en-y N/A 11/02/2013    Procedure: LAPAROSCOPIC ROUX-EN-Y GASTRIC BYPASS WITH UPPER ENDOSCOPY;  Surgeon: Gayland Curry, MD;  [facility];  Service: General;  Laterality: N/A;  . Pelvic laparoscopy  1990's    d/t endometriosis  . Dilitation & currettage/hystroscopy with novasure ablation N/A 05/30/2014    Procedure: DILATATION & CURETTAGE/HYSTEROSCOPY WITH attempted [place] and with IUD removal ;  Surgeon: Jamey Reas de Berton Lan, MD;  Location: [facility];  Service: Gynecology;  Laterality: N/A;  . Hysteroscopy with resectoscope N/A 05/30/2014    Procedure: HYSTEROSCOPY WITH RESECTOSCOPE;  Surgeon: Jamey Reas de Berton Lan, MD;  Location: [facility];  Service: Gynecology;  Laterality: N/A;    Family History  Problem Relation Age of  Onset  . Diabetes Mother     Living  . Kidney disease Mother   . Hypertension Mother   . Cancer Mother     breast cancer  . Breast cancer Mother 14    dx'd with breast ca x2  . Cancer Maternal Grandmother   . Diabetes Maternal Grandmother 10    dec  . Breast cancer Maternal Grandmother   . Diabetes Maternal Grandfather   . Stroke Maternal Grandfather   . Arthritis Father   . Healthy Sister     x2  . Healthy Daughter   . Thyroid disease Neg Hx     Social History   Social History  . Marital Status: Single    Spouse Name: N/A  . Number of Children: N/A  . Years of Education:  N/A   Social History Main Topics  . Smoking status: Never Smoker   . Smokeless tobacco: Never Used  . Alcohol Use: 0.6 oz/week    1 Standard drinks or equivalent per week     Comment: 1-2 glasses of wine a few times per month  . Drug Use: No  . Sexual Activity:    Partners: Female   Other Topics Concern  . 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  acetaminophen (TYLENOL) 500 MG tablet, Take 1,000 mg by mouth every 6 (six) hours as needed for mild pain., Disp: , Rfl:  .  alprazolam (XANAX) 2 MG tablet, Take 1-2 mg by mouth 2 (two) times daily as needed for anxiety. , Disp: , Rfl:  .  DULoxetine (CYMBALTA) 60 MG capsule, Take 120 mg by mouth every morning. , Disp: , Rfl:  .  ELIGEN B12 1000-100 MCG-MG TABS, TAKE 1 TABLET BY MOUTH DAILY., Disp: 30 tablet, Rfl: 6 .  Multiple Vitamin (MULTIVITAMIN) tablet, Take 1 tablet by mouth daily., Disp: , Rfl:  .  nortriptyline (PAMELOR) 10 MG/5ML solution, Take 10 mLs (20 mg total) by mouth at bedtime for 3 days, then increase to 15 mL (30mg ) at bedtime x 2 weeks, then increase to 77mL (40mg )., Disp: 500 mL, Rfl: 5 .  ondansetron (ZOFRAN) 4 MG tablet, TAKE 1 TABLET (4 MG TOTAL) BY MOUTH EVERY 8 (EIGHT) HOURS AS NEEDED FOR NAUSEA OR VOMITING., Disp: 9 tablet, Rfl: 0 .  Rice Bran OIL, Take by mouth., Disp: , Rfl:   EXAM:  Filed Vitals:   06/02/15 1122  BP: 138/85  Pulse: 74  Temp: 98.1 F (36.7 C)    Body mass index is 27.3 kg/(m^2).  GENERAL: vitals reviewed and listed above, alert, oriented, appears well hydrated and in no acute distress  HEENT: atraumatic, conjunttiva clear, no obvious abnormalities on inspection of external nose and ears  NECK: no  obvious masses on inspection  LUNGS: clear to auscultation bilaterally, no wheezes, rales or rhonchi, good air movement  CV: HRRR, no peripheral edema  MS: moves all extremities without noticeable abnormality  PSYCH: pleasant and cooperative, no obvious depression or anxiety  ASSESSMENT AND PLAN:  Discussed the following assessment and plan:  Thyroid  goiter - Plan: TSH  Paresthesia  Mood disorder ([place])  We had a lengthy discussion of > 30 minutes in face to face counseling. She is understandably frustrated that despite her very real symptoms no etiology has been uncovered. I did explain that this does occur not infrequently in modern western medicine and stressed that she is not crazy. I explained that the human body is extremely complex and that every individual is unique and every physician has a unique knowledge base that may not include all disorders ever established. We discussed the evaluation she has had thus far and the extensive list of disorders that have been ruled out. We discussed the pros and cons of continued pursuit of a dx, an eval at [facility] or an integrative medicine evaluation. We opted to start with CBT to help with managing the stress of not having a dx and support the body the best we can with a healthy lifestyle. We opted if not improving or if worsening to consider a cautious 3rd opinion at [facility] with neurology or integrative medicine, but she will plan to take all test and lab results and OVs from her recent specialist evaluations with her so that repeat testing is limited. She reports that her symptoms currently are tolerable with her current treatments and she does not wish to add medications or see a pain clinic at this time. We will check a TSH today per her wishes and she will plan to follow up with endocrine as planned. -Patient advised to return or notify a doctor immediately if symptoms worsen or persist or new concerns arise.  There are no Patient  Instructions on file for this visit.   Colin Benton R.

## 2015-06-16 ENCOUNTER — Other Ambulatory Visit: Payer: Self-pay

## 2015-06-16 DIAGNOSIS — Z1231 Encounter for screening mammogram for malignant neoplasm of breast: Secondary | ICD-10-CM

## 2015-07-10 ENCOUNTER — Ambulatory Visit: Payer: BLUE CROSS/BLUE SHIELD

## 2015-07-13 ENCOUNTER — Ambulatory Visit: Payer: BLUE CROSS/BLUE SHIELD

## 2015-07-25 ENCOUNTER — Ambulatory Visit
Admission: RE | Admit: 2015-07-25 | Discharge: 2015-07-25 | Disposition: A | Discharge status: Home / Self Care | Payer: BLUE CROSS/BLUE SHIELD | Source: Ambulatory Visit

## 2015-07-25 DIAGNOSIS — Z1231 Encounter for screening mammogram for malignant neoplasm of breast: Secondary | ICD-10-CM

## 2015-08-03 IMAGING — XA DG FLUORO GUIDE LUMBAR PUNCTURE
1 series · 4 of 4 positions shown · non-contrast
Comparison: none

CLINICAL DATA: Peroneal mononeuropathy

EXAM:
LUMBAR PUNCTURE UNDER FLUOROSCOPY
FLUOROSCOPY TIME:  13 seconds
TECHNIQUE: The procedure, risks (including but not limited to bleeding,
infection, organ damage ), benefits, and alternatives were explained
to the patient. Questions regarding the procedure were encouraged
and answered. The patient understands and consents to the procedure.
Patient placed in left lateral decubitus positioning. An appropriate
skin entry site was determined fluoroscopically. Operator donned
sterile gloves and mask. Skin site was marked, then prepped with
Betadine, draped in usual sterile fashion, and infiltrated locally
with 1% lidocaine. A 20 gauge spinal needle advanced into the thecal
sac at L3 from a right parasagittal interlaminar approach. Clear
colorless CSF spontaneously returned, with opening pressure of 11 cm
H2O. 23ml CSF were collected and divided among 4 sterile vials for
the requested laboratory studies. The needle was then removed.
COMPLICATIONS:
None immediate

[Series 2: ortho standard · 4 of 7 frames shown]
[frame 2/7]
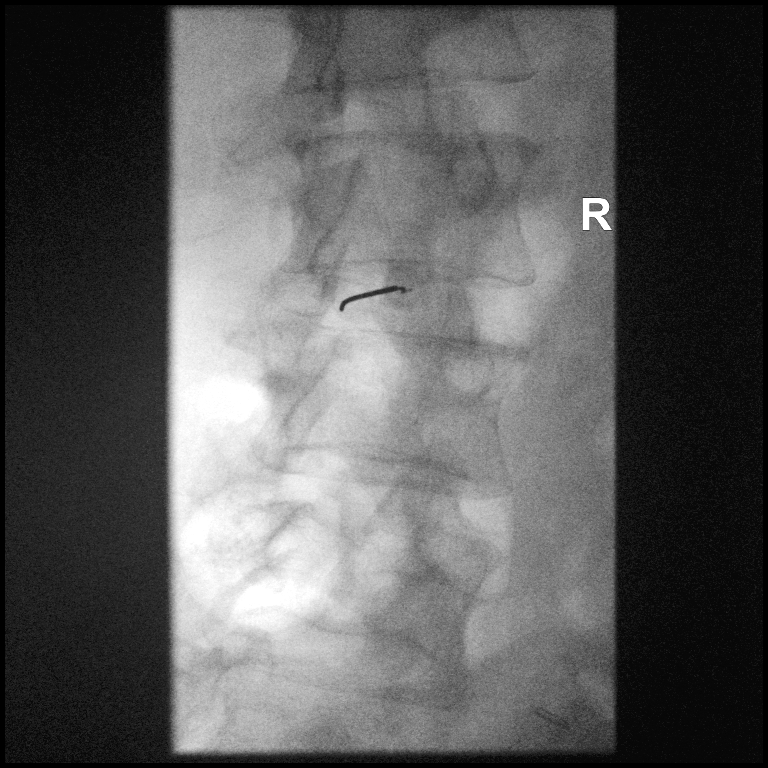
[frame 4/7]
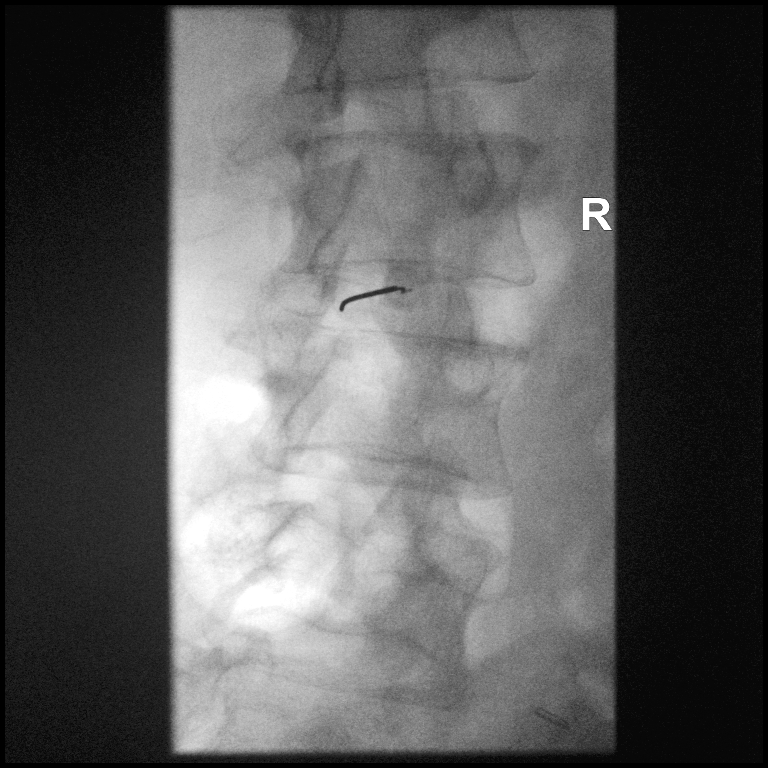
[frame 6/7]
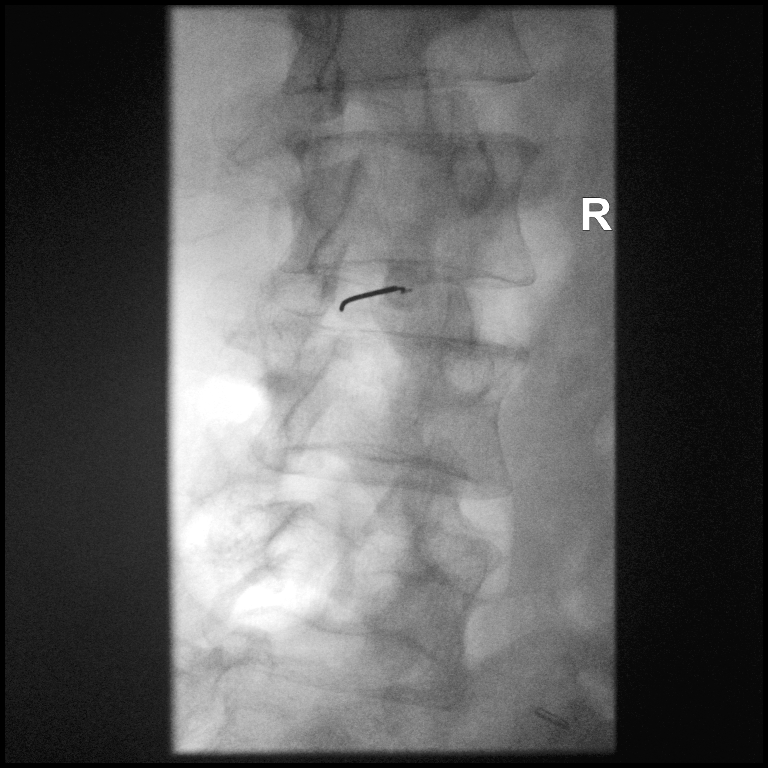
[frame 7/7]
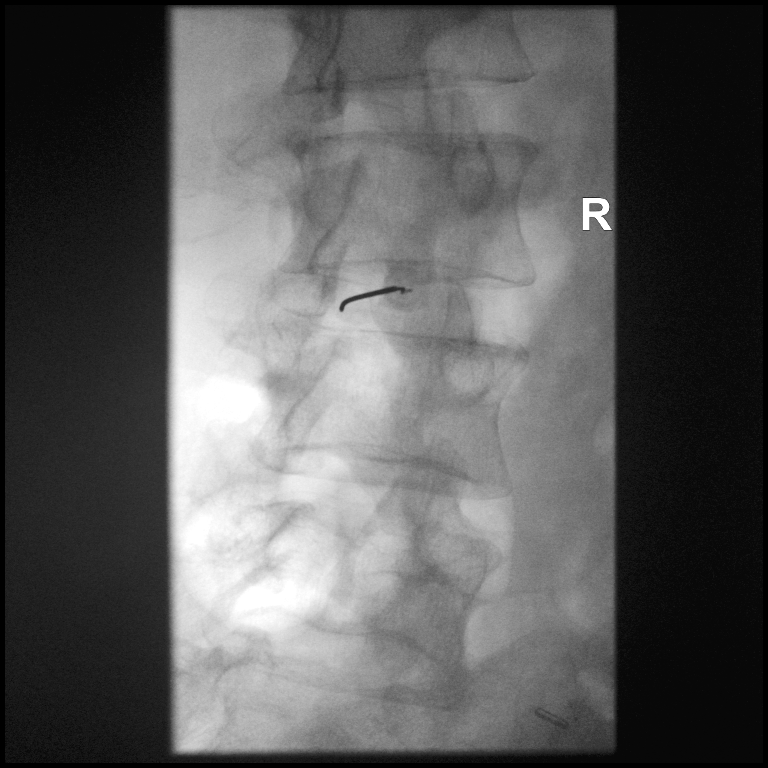

[4 of 4 positions shown; findings below may reference images not displayed]

IMPRESSION: 1. Technically successful lumbar puncture under fluoroscopy.

## 2015-08-09 ENCOUNTER — Ambulatory Visit: Payer: BLUE CROSS/BLUE SHIELD | Admitting: Family Medicine

## 2015-08-16 ENCOUNTER — Encounter: Payer: Self-pay | Admitting: Family Medicine

## 2015-09-13 ENCOUNTER — Encounter: Payer: Self-pay | Admitting: Neurology

## 2015-10-11 ENCOUNTER — Encounter: Payer: Self-pay | Admitting: Neurology

## 2015-10-23 ENCOUNTER — Ambulatory Visit: Payer: BLUE CROSS/BLUE SHIELD | Admitting: Endocrinology

## 2015-11-29 ENCOUNTER — Ambulatory Visit: Payer: BLUE CROSS/BLUE SHIELD | Admitting: Endocrinology

## 2015-12-08 ENCOUNTER — Encounter: Payer: Self-pay | Admitting: Endocrinology

## 2015-12-08 ENCOUNTER — Ambulatory Visit (INDEPENDENT_AMBULATORY_CARE_PROVIDER_SITE_OTHER): Payer: BLUE CROSS/BLUE SHIELD | Admitting: Endocrinology

## 2015-12-08 VITALS — BP 130/70 | HR 73 | Temp 98.3°F | Ht 67.5 in | Wt 181.0 lb

## 2015-12-08 DIAGNOSIS — E042 Nontoxic multinodular goiter: Secondary | ICD-10-CM | POA: Diagnosis not present

## 2015-12-08 LAB — TSH: TSH: 0.74 u[IU]/mL (ref 0.35–4.50)

## 2015-12-08 NOTE — Patient Instructions (Addendum)
Let's recheck the ultrasound.  you will receive a phone call, about a day and time for an appointment. blood tests are requested for you today.  We'll let you know about the results. most of the time, a "lumpy thyroid" will eventually become overactive.  this is usually a slow process, happening over the span of many years.  Please come back for a follow-up appointment in 1 year.

## 2015-12-08 NOTE — Progress Notes (Signed)
Subjective:    Patient ID: [name], female    DOB: 02-25-1971, 45 y.o.   MRN: [id number]  HPI Pt returns for f/u of multinodular goiter (in 2016, was incidentally noted to have a goiter, on carotid US; bx then was beth cat 2; she has been euthyroid).  she intermittently notices the goiter.  She denies sob. Past Medical History  Diagnosis Date  . Headache(784.0)     tension or sinus  . Anxiety and depression   . Right knee pain     hx meniscal injury, chondromalacia, OA  . Dysmenorrhea     hx endometriosis and fibroid  . Diabetes ([place])     resolved with gastric bypass  . History of MRSA infection prior to 2012    lasted 5-6 yr.  . Dental crowns present   . Jaw snapping     states jaw pops  . Complication of anesthesia     has been hard to wake up post-op  . DVT of lower extremity (deep venous thrombosis) ([place]) 2012    LLE  . Neuropathy ([place]) 2015    hands, feet and face--[place] neurology  . Left foot drop 2016    --[place] [facility]    Past Surgical History  Procedure Laterality Date  . Hand surgery Left     X 3 - spider bite and MRSA infection  . Wrist surgery Right 2005  . Knee arthroscopy Right 12/29/2012    Procedure: RIGHT KNEE ARTHROSCOPY  PARTIAL MEDIAL AND LATERAL MENISECTOMY WITH CHONDROPLASTY;  Surgeon: Hessie Dibble, MD;  Location: [facility];  Service: Orthopedics;  Laterality: Right;  . Tonsillectomy  as child  . Cholecystectomy  1990's  . Gastric roux-en-y N/A 11/02/2013    Procedure: LAPAROSCOPIC ROUX-EN-Y GASTRIC BYPASS WITH UPPER ENDOSCOPY;  Surgeon: Gayland Curry, MD;  [facility];  Service: General;  Laterality: N/A;  . Pelvic laparoscopy  1990's    d/t endometriosis  . Dilitation & currettage/hystroscopy with novasure ablation N/A 05/30/2014    Procedure: DILATATION & CURETTAGE/HYSTEROSCOPY WITH attempted [place] and with IUD removal ;  Surgeon: Jamey Reas de Berton Lan, MD;  Location: [facility];   Service: Gynecology;  Laterality: N/A;  . Hysteroscopy with resectoscope N/A 05/30/2014    Procedure: HYSTEROSCOPY WITH RESECTOSCOPE;  Surgeon: Jamey Reas de Berton Lan, MD;  Location: [facility];  Service: Gynecology;  Laterality: N/A;    Social History   Social History  . Marital Status: Single    Spouse Name: N/A  . Number of Children: N/A  . Years of Education: N/A   Occupational History  . Not on file.   Social History Main Topics  . Smoking status: Never Smoker   . Smokeless tobacco: Never Used  . Alcohol Use: 0.6 oz/week    1 Standard drinks or equivalent per week     Comment: 1-2 glasses of wine a few times per month  . Drug Use: No  . Sexual Activity:    Partners: Female   Other Topics Concern  . 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 acetaminophen (TYLENOL) 500 MG tablet Take 1,000 mg by mouth every 6 (six) hours as needed for mild pain.    Marland Kitchen alprazolam (XANAX) 2 MG tablet Take 1-2 mg by mouth 2 (two) times daily as needed for anxiety.     . DULoxetine (CYMBALTA) 60 MG capsule Take 120 mg by mouth every morning.     . Multiple Vitamin (MULTIVITAMIN) tablet Take 1 tablet by mouth daily.     No current facility-administered medications on file prior to visit.    Allergies  Allergen Reactions  . Other Hives  . Sulfa Antibiotics Other (See Comments)    HALLUCINATIONS, FEVER, CHILLS  . Bactrim [Sulfamethoxazole-Trimethoprim] Other (See Comments)    Hallucinations, fever chills  . Ibuprofen Other (See Comments)    Had gastric bypass. Told not to take NSAIDs.    Family History  Problem Relation Age of Onset    . Diabetes Mother     Living  . Kidney disease Mother   . Hypertension Mother   . Cancer Mother     breast cancer  . Breast cancer Mother 64    dx'd with breast ca x2  . Cancer Maternal Grandmother   . Diabetes Maternal Grandmother 53    dec  . Breast cancer Maternal Grandmother   . Diabetes Maternal Grandfather   . Stroke Maternal Grandfather   . Arthritis Father   . Healthy Sister     x2  . Healthy Daughter   . Thyroid disease Neg Hx     BP 130/70 mmHg  Pulse 73  Temp(Src) 98.3 F (36.8 C) (Oral)  Ht 5' 7.5" (1.715 m)  Wt 181 lb (82.101 kg)  BMI 27.91 kg/m2  SpO2 98%  Review of Systems No dysphagia    Objective:   Physical Exam VITAL SIGNS:  See vs page GENERAL: no distress NECK: the right thyroid nodule is again palpable, and freely mobile.    Lab Results  Component Value Date   TSH 0.74 12/08/2015   T4TOTAL 9.6 08/03/2013      Assessment & Plan:  Multinodular goiter, clinically unchanged.    Patient is advised the following: Patient Instructions  Let's recheck the ultrasound.  you will receive a phone call, about a day and time for an appointment. blood tests are requested for you today.  We'll let you know about the results. most of the time, a "lumpy thyroid" will eventually become overactive.  this is usually a slow process, happening over the span of many years.  Please come back for a follow-up appointment in 1 year.

## 2015-12-15 ENCOUNTER — Ambulatory Visit
Admission: RE | Admit: 2015-12-15 | Discharge: 2015-12-15 | Disposition: A | Discharge status: Home / Self Care | Payer: BLUE CROSS/BLUE SHIELD | Source: Ambulatory Visit | Attending: Endocrinology | Admitting: Endocrinology

## 2015-12-15 DIAGNOSIS — E042 Nontoxic multinodular goiter: Secondary | ICD-10-CM

## 2015-12-20 ENCOUNTER — Encounter: Payer: Self-pay | Admitting: Neurology

## 2016-05-28 ENCOUNTER — Ambulatory Visit (INDEPENDENT_AMBULATORY_CARE_PROVIDER_SITE_OTHER): Payer: BLUE CROSS/BLUE SHIELD | Admitting: Family Medicine

## 2016-05-28 ENCOUNTER — Encounter: Payer: Self-pay | Admitting: Family Medicine

## 2016-05-28 VITALS — BP 112/68 | HR 75 | Temp 98.7°F | Ht 67.5 in | Wt 198.7 lb

## 2016-05-28 DIAGNOSIS — G479 Sleep disorder, unspecified: Secondary | ICD-10-CM | POA: Diagnosis not present

## 2016-05-28 DIAGNOSIS — J069 Acute upper respiratory infection, unspecified: Secondary | ICD-10-CM | POA: Diagnosis not present

## 2016-05-28 MED ORDER — AMOXICILLIN-POT CLAVULANATE 250-62.5 MG/5ML PO SUSR: 500.0000 mg | Freq: Three times a day (TID) | ORAL | 0 refills | Status: DC

## 2016-05-28 NOTE — Patient Instructions (Signed)
Afrin nasal spray 2x daily for 4 days. Do not use longer. Nasal saline several times per day. If symptoms not improving please use the antibiotic according to instructions.  Follow up as needed is symptoms worsen or persist despite treatment.  We placed a referral for you as discussed to the sleep specialist per your request. It usually takes about 1-2 weeks to process and schedule this referral. If you have not heard from [place] regarding this appointment in 2 weeks please contact our office.

## 2016-05-28 NOTE — Progress Notes (Signed)
HPI:  Sinus Congestion: -started: 1.5 weeks ago, now worsening with sinus pain -symptoms:nasal congestion, sore throat, cough, sneezing, low grade fever initially, some frontal sinus pain -denies:fever > 100, SOB, NVD, tooth pain, neck stiffness or pain, rash -has tried: nothing -sick contacts/travel/risks: no reported flu, strep or tick exposure -can not take pills  Poor sleep: -moves a lot in sleep, unrestful sleep, daytime somnolence -no snoring, narcolepsy or known OSA -seeing neurologist at [facility] for her neuropathy and they advised she see a sleep specialist, she wants to see someone in [place] and requests referral  ROS: See pertinent positives and negatives per HPI.  Past Medical History:  Diagnosis Date  . Anxiety and depression   . Complication of anesthesia    has been hard to wake up post-op  . Dental crowns present   . Diabetes ([place])    resolved with gastric bypass  . DVT of lower extremity (deep venous thrombosis) ([place]) 2012   LLE  . Dysmenorrhea    hx endometriosis and fibroid  . Headache(784.0)    tension or sinus  . History of MRSA infection prior to 2012   lasted 5-6 yr.  . Jaw snapping    states jaw pops  . Left foot drop 2016   --[facility] [facility]  . Neuropathy ([place]) 2015   hands, feet and face--[facility]  . Right knee pain    hx meniscal injury, chondromalacia, OA    Past Surgical History:  Procedure Laterality Date  . CHOLECYSTECTOMY  1990's  . DILITATION & CURRETTAGE/HYSTROSCOPY WITH NOVASURE ABLATION N/A 05/30/2014   Procedure: DILATATION & CURETTAGE/HYSTEROSCOPY WITH attempted [place] and with IUD removal ;  Surgeon: Jamey Reas de Berton Lan, MD;  Location: [facility];  Service: Gynecology;  Laterality: N/A;  . GASTRIC ROUX-EN-Y N/A 11/02/2013   Procedure: LAPAROSCOPIC ROUX-EN-Y GASTRIC BYPASS WITH UPPER ENDOSCOPY;  Surgeon: Gayland Curry, MD;  Location: [facility];  Service: General;  Laterality: N/A;  . HAND  SURGERY Left    X 3 - spider bite and MRSA infection  . HYSTEROSCOPY WITH RESECTOSCOPE N/A 05/30/2014   Procedure: HYSTEROSCOPY WITH RESECTOSCOPE;  Surgeon: Jamey Reas de Berton Lan, MD;  Location: [facility];  Service: Gynecology;  Laterality: N/A;  . KNEE ARTHROSCOPY Right 12/29/2012   Procedure: RIGHT KNEE ARTHROSCOPY  PARTIAL MEDIAL AND LATERAL MENISECTOMY WITH CHONDROPLASTY;  Surgeon: Hessie Dibble, MD;  Location: [facility];  Service: Orthopedics;  Laterality: Right;  . PELVIC LAPAROSCOPY  1990's   d/t endometriosis  . TONSILLECTOMY  as child  . WRIST SURGERY Right 2005    Family History  Problem Relation Age of Onset  . Diabetes Mother     Living  . Kidney disease Mother   . Hypertension Mother   . Cancer Mother     breast cancer  . Breast cancer Mother 37    dx'd with breast ca x2  . Cancer Maternal Grandmother   . Diabetes Maternal Grandmother 12    dec  . Breast cancer Maternal Grandmother   . Diabetes Maternal Grandfather   . Stroke Maternal Grandfather   . Arthritis Father   . Healthy Sister     x2  . Healthy Daughter   . Thyroid disease Neg Hx     Social History   Social History  . Marital status: Single    Spouse name: N/A  . Number of children: N/A  . Years of education: N/A   Social History Main  Topics  . Smoking status: Never Smoker  . Smokeless tobacco: Never Used  . Alcohol use 0.6 oz/week    1 Standard drinks or equivalent per week     Comment: 1-2 glasses of wine a few times per month  . Drug use: No  . Sexual activity: Yes    Partners: Female   Other Topics Concern  . 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  acetaminophen  (TYLENOL) 500 MG tablet, Take 1,000 mg by mouth every 6 (six) hours as needed for mild pain., Disp: , Rfl:  .  DULoxetine (CYMBALTA) 60 MG capsule, Take 120 mg by mouth every morning. , Disp: , Rfl:  .  Multiple Vitamin (MULTIVITAMIN) tablet, Take 1 tablet by mouth daily., Disp: , Rfl:  .  amoxicillin-clavulanate (AUGMENTIN) 250-62.5 MG/5ML suspension, Take 10 mLs (500 mg total) by mouth 3 (three) times daily., Disp: 150 mL, Rfl: 0  EXAM:  Vitals:   05/28/16 1533  BP: 112/68  Pulse: 75  Temp: 98.7 F (37.1 C)    Body mass index is 30.66 kg/m.  GENERAL: vitals reviewed and listed above, alert, oriented, appears well hydrated and in no acute distress  HEENT: atraumatic, conjunttiva clear, no obvious abnormalities on inspection of external nose and ears, normal appearance of ear canals and TMs, clear nasal congestion, mild post oropharyngeal erythema with PND, no tonsillar edema or exudate, no sinus TTP  NECK: no obvious masses on inspection  LUNGS: clear to auscultation bilaterally, no wheezes, rales or rhonchi, good air movement  CV: HRRR, no peripheral edema  MS: moves all extremities without noticeable abnormality  PSYCH: pleasant and cooperative, no obvious depression or anxiety  ASSESSMENT AND PLAN:  Discussed the following assessment and plan:  Acute upper respiratory infection  Sleep disorder - Plan: Ambulatory referral to Neurology  -given HPI and exam findings today, a serious infection or illness is unlikely. We discussed potential etiologies, with VURI being most likely, and possible developing sinusitis. We discussed treatment side effects, likely course, antibiotic misuse, transmission, and signs of developing a serious illness. -opted to try nasal decongestant short course, nasal saline and abx if not improving  -referral to sleep specialist per her request -of course, we advised to return or notify a doctor immediately if symptoms worsen or persist or new  concerns arise.    Patient Instructions  Afrin nasal spray 2x daily for 4 days. Do not use longer. Nasal saline several times per day. If symptoms not improving please use the antibiotic according to instructions.  Follow up as needed is symptoms worsen or persist despite treatment.  We placed a referral for you as discussed to the sleep specialist per your request. It usually takes about 1-2 weeks to process and schedule this referral. If you have not heard from [place] regarding this appointment in 2 weeks please contact our office.     Colin Benton R., DO

## 2016-05-28 NOTE — Progress Notes (Signed)
Pre visit review using our clinic review tool, if applicable. No additional management support is needed unless otherwise documented below in the visit note. 

## 2016-05-30 ENCOUNTER — Ambulatory Visit: Payer: BLUE CROSS/BLUE SHIELD | Admitting: Obstetrics and Gynecology

## 2016-06-06 ENCOUNTER — Encounter (HOSPITAL_COMMUNITY): Payer: Self-pay

## 2016-06-12 ENCOUNTER — Encounter: Payer: Self-pay | Admitting: Neurology

## 2016-06-12 ENCOUNTER — Ambulatory Visit (INDEPENDENT_AMBULATORY_CARE_PROVIDER_SITE_OTHER): Payer: BLUE CROSS/BLUE SHIELD | Admitting: Neurology

## 2016-06-12 VITALS — BP 120/76 | HR 72 | Resp 20 | Ht 68.0 in | Wt 203.0 lb

## 2016-06-12 DIAGNOSIS — E569 Vitamin deficiency, unspecified: Secondary | ICD-10-CM | POA: Diagnosis not present

## 2016-06-12 DIAGNOSIS — G4730 Sleep apnea, unspecified: Secondary | ICD-10-CM

## 2016-06-12 DIAGNOSIS — R0683 Snoring: Secondary | ICD-10-CM

## 2016-06-12 DIAGNOSIS — G4710 Hypersomnia, unspecified: Secondary | ICD-10-CM

## 2016-06-12 DIAGNOSIS — D508 Other iron deficiency anemias: Secondary | ICD-10-CM

## 2016-06-12 DIAGNOSIS — G63 Polyneuropathy in diseases classified elsewhere: Secondary | ICD-10-CM

## 2016-06-12 MED ORDER — MELATONIN 1 MG SL SUBL: 3.0000 mg | SUBLINGUAL_TABLET | Freq: Every day | SUBLINGUAL | 2 refills | Status: DC

## 2016-06-12 NOTE — Patient Instructions (Signed)

## 2016-06-12 NOTE — Progress Notes (Signed)
SLEEP MEDICINE CLINIC   Provider:  Larey Seat, M D  Referring Provider: Lucretia Kern, DO Primary Care Physician:  Lucretia Kern., DO  Chief Complaint  Patient presents with  . New Patient (Initial Visit)    moves in sleep, snores occasionally, never had a sleep study    HPI:  [name] is a 45 y.o. female , seen here as a referral  from Dr. Maudie Mercury for An evaluation of sleep quality, restless sleep, daytime somnolence. 06/12/16  Chief complaint according to patient : " I may have RLS, I am followed for neuropathy by a specialist a [facility], for discomfort in hands, legs and face."  [name] has a history of morbid obesity before she underwent gastric bypass surgery, with the exacerbated weight loss postsurgically her diabetes resolved,. Prior to the gastric bypass surgery she had acquired MRSA infection and struggled with an for 5 or 6 years, she suffered frequent tension and sinus headaches, has a history of endometriosis and fibroid dysmenorrhea, a DVT left lower extremity in 2012, and anxiety and depression were noted. Also complications of anesthesia were noted, the patient has a hard time waking up after surgery.   Sleep habits are as follows: Usually she goes to the bedroom and retreats by about 9.30 PM and will read for about 30 minutes or so. By 9:55  the lights are off and the patient is asleep. Sometimes she has creepy crawly sensations in the lower extremities which she cannot attribute to specific daytime activity levels and there are not a nightly occurrence. 4 nights  per week. She sleeps sometimes on her back sometimes on her signs, she uses a lot of pillows to prop herself into comfortable position, and one pillow will be between the knees up to 3 under the head. The bedroom is cool, quiet and dark and she shares a bedroom with her partner. She does have trouble to maintain sleep and wakes up several times at night the first time usually between 2 AM and 3 AM and then  again between 4 and 5. Sometimes a period of wakefulness will spend 20 minutes sometimes up to an hour. She denies any ruminating thoughts, bodies, there are no palpitations or diaphoresis, there is no pain per se waking her .There is an underlying discomfort all the time, related to her neuropathy diagnosis. Her parnter has not noticed her to kick or act out in sleep.  It is not the urge to urinate that wakes her but she often uses this break in her sleep pattern to go to the bathroom. She reports that she occasionally snores but she does have witnessed to her sleep pattern, her partner has witnessed snoring and also sleep talking. She rises at 5.45 by alarm, usually at a time when she is dreaming.    Sleep medical history and family sleep history:  Her mother is using a BiPAP machine for the treatment of sleep apnea, one sister is very overweight but is not sure if she suffers from obstructive sleep apnea.   The patient slept walked in childhood, she suffered from frequent night terrors well into her early 73s. No enuresis,. Weight loss surgery allowed for loosing 115 pounds, surgery date 3- 14- 2015.   Social history:  Single but not living alone. The patient has a daytime office job but her work environment does not provide natural daylight. He usually leaves her office for lunchtime. She drinks coffee throughout the day about 4-5 months and after denied  decaffeinated coffee. He does not drink sodas nor ice tea. No history of tobacco use. She drinks wine but not hard liquor. 2-3 glasses a week at dinnertime.   The following are Dr Serita Grit notes regarding neuropathy care for [name]. :  Interim History: [name] is a 45 y.o. right-handed Caucasian female with depression/anxiety and prior obesity s/p gastric bypass (March 2015, lost 120lb+), and arthritis of the knees returning to the clinic for follow-up of left foot drop and generalized paresthesias.  The patient was accompanied to  the clinic by self.  History of present illness: Around late September 2015, she developed numbness of her 4th toe which progressed to involve the dorsum of the foot over a few weeks.  Soon, she noticed tingling/numbness over the left lateral lower leg.  Early December, she developed similar numbness/tingling over the right leg (toes, dorsum of the foot, and lateral leg).  Within a week, left lateral thigh started feeling different, she developed burning pain of her left > right hand and forearm, and noted numbness of the nose and lips.  She is having greater difficulty with walking due to left foot weakness, as if she is tripping over her own toes and is falling about once every other day.  She is able to get up by herself and has not sustained traumatic injuries, but had bruised herself quite a bit.  There is no preceding lightheadedness, changes in vision, or palpitations. She has been having a lot of cramp and muscle spasms in her left lower leg.  She endorses dry eyes and dry mouth. She denies any bowel/bladder incontinence, drooling, difficulty swallowing.  Her taste has also been more bland lately.  She was diabetic from 2011-2015 with HbA1c ranging ~8.4% but this resolved after her weight loss surgery.    She has been seeing Dr. Tamala Julian with Sports Medicine for ongoing problems with her knees and during his evaluation in early December, she had raised these new symptoms to him and her exam showed new left foot drop.  MRI of the lumbar spine wo contrast was ordered and did not show any nerve root impingement. She was given a trial of prednisone taper over one week, and she denies any significant change.  UPDATE 08/22/2014:  Patient is here to discuss the results of testing to-date, including EMG, CSF, imaging, and serology testing.  Notable findings include subacute bilateral common peroneal mononeuropathy.  Serology testing and CSF has been normal.    She feels that her hands started burning and  tingling which was severe a few days ago.  She is falling less but is stumbling more frequently.  Her legs are stable, without any worsening.  She continues to have numbness of the face especially nose.   Dr Posey Pronto.  Review of Systems: Out of a complete 14 system review, the patient complains of only the following symptoms, and all other reviewed systems are negative.   Epworth score is endorsed at 9 points, fatigue severity at 44 points, the restless leg question here by [facility] endorsed at mild to moderate impact the RL as 6 rating scale endorsed at mild to moderate impact periods she describes the main quality is aching pain usually not severe in daytime even when at rest, but correlating to high degrees of daytime sleepiness. She also endorsed joint pain and joint swelling headaches numbness weakness and sleepiness. Depression on Cymbalta.    Social History   Social History  . Marital status: Single  Spouse name: N/A  . Number of children: N/A  . Years of education: N/A   Occupational History  . Not on file.   Social History Main Topics  . Smoking status: Never Smoker  . Smokeless tobacco: Never Used  . Alcohol use 0.6 oz/week    1 Standard drinks or equivalent per week     Comment: 1-2 glasses of wine a few times per month  . Drug use: No  . Sexual activity: Yes    Partners: Female   Other Topics Concern  . 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 Diabetes Mother     Living  . Kidney disease Mother   . Hypertension Mother   . Cancer Mother     breast cancer  . Breast cancer Mother 73    dx'd with breast ca x2  . Cancer Maternal Grandmother   . Diabetes  Maternal Grandmother 60    dec  . Breast cancer Maternal Grandmother   . Diabetes Maternal Grandfather   . Stroke Maternal Grandfather   . Arthritis Father   . Healthy Sister     x2  . Healthy Daughter   . Thyroid disease Neg Hx     Past Medical History:  Diagnosis Date  . Anxiety and depression   . Complication of anesthesia    has been hard to wake up post-op  . Dental crowns present   . Diabetes ([place])    resolved with gastric bypass  . DVT of lower extremity (deep venous thrombosis) ([place]) 2012   LLE  . Dysmenorrhea    hx endometriosis and fibroid  . Headache(784.0)    tension or sinus  . History of MRSA infection prior to 2012   lasted 5-6 yr.  . Jaw snapping    states jaw pops  . Left foot drop 2016   --[facility]  . Neuropathy ([place]) 2015   hands, feet and face--[facility]  . Right knee pain    hx meniscal injury, chondromalacia, OA    Past Surgical History:  Procedure Laterality Date  . CHOLECYSTECTOMY  1990's  . DILITATION & CURRETTAGE/HYSTROSCOPY WITH NOVASURE ABLATION N/A 05/30/2014   Procedure: DILATATION & CURETTAGE/HYSTEROSCOPY WITH attempted [place] and with IUD removal ;  Surgeon: Jamey Reas de Berton Lan, MD;  Location: [facility];  Service: Gynecology;  Laterality: N/A;  . GASTRIC ROUX-EN-Y N/A 11/02/2013   Procedure: LAPAROSCOPIC ROUX-EN-Y GASTRIC BYPASS WITH UPPER ENDOSCOPY;  Surgeon: Gayland Curry, MD;  Location: [facility];  Service: General;  Laterality: N/A;  . HAND SURGERY Left    X 3 - spider bite and MRSA infection  . HYSTEROSCOPY WITH RESECTOSCOPE N/A 05/30/2014   Procedure: HYSTEROSCOPY WITH RESECTOSCOPE;  Surgeon: Jamey Reas de Berton Lan, MD;  Location: [facility];  Service: Gynecology;  Laterality: N/A;  . KNEE ARTHROSCOPY Right 12/29/2012   Procedure: RIGHT KNEE ARTHROSCOPY  PARTIAL MEDIAL AND LATERAL MENISECTOMY WITH CHONDROPLASTY;  Surgeon: Hessie Dibble, MD;  Location: [facility];   Service: Orthopedics;  Laterality: Right;  . PELVIC LAPAROSCOPY  1990's   d/t endometriosis  . TONSILLECTOMY  as child  . WRIST SURGERY Right 2005    Current Outpatient Prescriptions  Medication Sig Dispense Refill  . DULoxetine (CYMBALTA) 60 MG capsule Take 120 mg by mouth every morning.     . Multiple Vitamin (MULTIVITAMIN) tablet Take 1 tablet by mouth daily.     No current facility-administered medications for this visit.     Allergies as of 06/12/2016 - Review Complete 06/12/2016  Allergen Reaction Noted  . Other Hives 12/26/2014  . Sulfa antibiotics Other (See Comments) 09/22/2012  . Bactrim [sulfamethoxazole-trimethoprim] Other (See Comments) 10/26/2013  . Ibuprofen Other (See Comments) 05/19/2015    Vitals: BP 120/76   Pulse 72   Resp 20   Ht 5\' 8"  (1.727 m)   Wt 203 lb (92.1 kg)   BMI 30.87 kg/m  Last Weight:  Wt Readings from Last 1 Encounters:  06/12/16 203 lb (92.1 kg)   [id number] mass index is 30.87 kg/m.     Last Height:   Ht Readings from Last 1 Encounters:  06/12/16 5\' 8"  (1.727 m)    Physical exam:  General: The patient is awake, alert and appears not in acute distress. The patient is well groomed. Head: Normocephalic, atraumatic. Neck is supple. Mallampati 4,  neck circumference: 14.5 . Nasal airflow stuffy, but patent. , TMJ click left side evident . Status post TMJ surgery. The patient wore a mouth guard for years for bruxism prophylaxis. Retrognathia is not seen.  Cardiovascular:  Regular rate and rhythm , without  murmurs or carotid bruit, and without distended neck veins. Respiratory: Lungs are clear to auscultation. Skin:  Without evidence of edema, or rash- mottled appearance, a little waxy.   Neurologic exam : The patient is awake and alert, oriented to place and time.   Memory subjective  described as intact.  Speech is fluent,  without dysarthria, dysphonia or aphasia.  Mood and affect are appropriate.  Cranial nerves: Pupils are  equal and briskly reactive to light. Extraocular movements  in vertical and horizontal planes intact and without nystagmus. Visual fields by finger perimetry are intact. Hearing to finger rub intact.   Facial sensation intact to fine touch.  Facial motor strength is symmetric and tongue and uvula move midline. Shoulder shrug was symmetrical.   I noted that the patient has normal coordination without tremor ataxia or dysmetria on finger-to-nose test but she does have a rather weak grip strength bilaterally. She also has a week pinch strength.  Detailed Motor and sensory exam were deferred, see Dr. Serita Grit  note from June 2017   Gait and station: Patient walks without assistive device and is able unassisted to climb up to the exam table.  Stance is stable and normal.  No foot drop today.  Deep tendon reflexes: in the upper and lower extremities are symmetric and intact. Babinski maneuver response is downgoing.  The patient was advised of the nature of the diagnosed sleep disorder , the treatment options and risks for general a health and wellness arising from not treating the condition.  I spent more than  30  minutes of face to face time with the patient. Greater than 50% of time was spent in counseling and coordination of care. We have discussed the diagnosis and differential and I answered the patient's questions.    I reviewed Dr. Julianne Rice referral note, and also spoke to the patient at length.  Her sleep really has deteriorated over the last 2-1/2 years this correlates to the time post weight loss surgery. Her specialist at [facility] have tried to find a deficiency either for trace elements or vitamins but have been unsuccessful thus far. Oral supplements are not likely to help her if there is a absorption problem. Restless legs can develop with iron deficiency before anemia becomes evident and for this reason and her history of weight loss surgery I will order a ferritin, total iron  binding capacity and iron saturation panel. She has a history of iron deficiency anemia due to dysmenorrhagia.   Assessment:  After physical and neurologic examination, review of laboratory studies,  Personal review of imaging studies, reports of other /same  Imaging studies ,  Results of polysomnography/ neurophysiology testing and pre-existing records as far as provided in visit., my assessment is   1) neuropathy with overlap symptoms reminiscent of restless leg syndrome. She describes mostly a deep ache but not necessarily a creepy crawly sensation. Sometimes burning, sometimes it's tingling but it is not associated with the irresistible urge to move. She does not report mouth burning, does not chew on ice. Likely related to a loss of trace elements.   2) the sleep pattern may be fragmented by unconscious discomfort, not actual pain. To evaluate further and attended sleep study would be needed. It is essential that the sleep study is attended to witness if the patient arouses from sleep and which physiologic functions may have let to the arousal. This cannot be seen in a home sleep test.  3) the patient's partner has witnessed her to snore, there is a chance that she also has apnea.    Plan:  Treatment plan and additional workup :  Ferritin, TIBC and iron.  PSG with special attention to PLMs and arousals.  Stay on Cymbalta.  RV after PSG interpretation.  Add melatonin, sublingual dropper   Asencion [name] Christyne Mccain MD  06/12/2016   CC: Lucretia Kern, Do [address] [place], [place] [address]

## 2016-06-21 ENCOUNTER — Ambulatory Visit (INDEPENDENT_AMBULATORY_CARE_PROVIDER_SITE_OTHER): Payer: BLUE CROSS/BLUE SHIELD | Admitting: Neurology

## 2016-06-21 DIAGNOSIS — G4730 Sleep apnea, unspecified: Secondary | ICD-10-CM

## 2016-06-21 DIAGNOSIS — R0683 Snoring: Secondary | ICD-10-CM

## 2016-06-21 DIAGNOSIS — G4733 Obstructive sleep apnea (adult) (pediatric): Secondary | ICD-10-CM | POA: Diagnosis not present

## 2016-06-21 DIAGNOSIS — D508 Other iron deficiency anemias: Secondary | ICD-10-CM

## 2016-06-21 DIAGNOSIS — E569 Vitamin deficiency, unspecified: Secondary | ICD-10-CM

## 2016-06-21 DIAGNOSIS — G4710 Hypersomnia, unspecified: Secondary | ICD-10-CM

## 2016-06-28 DIAGNOSIS — R682 Dry mouth, unspecified: Secondary | ICD-10-CM | POA: Diagnosis not present

## 2016-06-28 DIAGNOSIS — G629 Polyneuropathy, unspecified: Secondary | ICD-10-CM | POA: Diagnosis not present

## 2016-06-28 DIAGNOSIS — Z9889 Other specified postprocedural states: Secondary | ICD-10-CM | POA: Diagnosis not present

## 2016-06-28 DIAGNOSIS — R768 Other specified abnormal immunological findings in serum: Secondary | ICD-10-CM | POA: Diagnosis not present

## 2016-07-02 ENCOUNTER — Telehealth: Payer: Self-pay | Admitting: Neurology

## 2016-07-02 NOTE — Procedures (Signed)
PATIENT'S NAME:  [name], [name] DOB:      08/07/1971      MR#:    [id number]     DATE OF RECORDING: 06/21/2016 REFERRING M.D.:  Lucretia Kern, DO Study Performed:   Baseline Polysomnogram HISTORY:  [name] is a 45 year old  female , seen here as a referral  from Dr. Maudie Mercury for evaluation of sleep quality, restless sleep, daytime somnolence. History of weight loss surgery, has RLS.  The patient endorsed the Epworth Sleepiness Scale at 9 points.   The patient's weight 203 pounds with a height of 68 (inches), resulting in a BMI of 30.7 kg/m2. The patient's neck circumference measured 14.5 inches.  CURRENT MEDICATIONS: Cymbalta   PROCEDURE:  This is a multichannel digital polysomnogram utilizing the Somnostar 11.2 system.  Electrodes and sensors were applied and monitored per AASM Specifications.   EEG, EOG, Chin and Limb EMG, were sampled at 200 Hz.  ECG, Snore and Nasal Pressure, Thermal Airflow, Respiratory Effort, CPAP Flow and Pressure, Oximetry was sampled at 50 Hz. Digital video and audio were recorded.      BASELINE STUDY : Lights Out was at 22:33 and Lights On at 05:11.  Total recording time (TRT) was 398 minutes, with a total sleep time (TST) of 326.5 minutes.   The patient's sleep latency was 15.5 minutes.  REM latency was 280 minutes.  The sleep efficiency was 82. %.     SLEEP ARCHITECTURE: WASO (Wake after sleep onset) was 55 minutes.  There were 40 minutes in Stage N1, 112 minutes Stage N2, 126 minutes Stage N3 and 48.5 minutes in Stage REM.  The percentage of Stage N1 was 12.3%, Stage N2 was 34.3%, Stage N3 was 38.6% and Stage R (REM sleep) was 14.9%.   RESPIRATORY ANALYSIS:  There were a total of 0 respiratory events:  0 obstructive apneas, 0 central apneas and 0 mixed apneas with a total of 0 apneas and an apnea index (AI) of 0 /hour. There were 0 hypopneas with a hypopnea index of 0 /hour. The patient also had 0 respiratory event related arousals (RERAs).     The total  APNEA/HYPOPNEA INDEX (AHI) was 0/hour and the total RESPIRATORY DISTURBANCE INDEX was 0 /hour.  0 events occurred in REM sleep and 0 events in NREM. The REM AHI was 0 /hour, versus a non-REM AHI of 0. The patient spent 156 minutes of total sleep time in the supine position and 171 minutes in non-supine. The supine AHI was 0.0 versus a non-supine AHI of 0.0.  OXYGEN SATURATION & C02:  The Wake baseline 02 saturation was 98%, with the lowest being 92%. Time spent below 89% saturation equaled 0 minutes.  PERIODIC LIMB MOVEMENTS:   The patient had a total of 203 Periodic Limb Movements.  The Periodic Limb Movement (PLM) index was 37.3 and the PLM Arousal index was 3.1/hour.   IMPRESSION: PLM disorder with arousals, correlating with clinical history of RLS. No OSA.    RECOMMENDATIONS:  1.  Correlated clinically f with the described  history ; consistent with restless legs syndrome (RLS).    Consider secondary restless legs syndrome.  Pharmacotherapy may be warranted.  Obtain a serum ferritin level if the clinical history is consistent with RLS.  Consider iron therapy and evaluation for iron deficiency anemia if the serum ferritin level < 50 ng/mL.  Certain medications or substances may aggravate RLS and common offenders may include the following:  nicotine, caffeine, SSRIs, TCAs, phenothiazines, dopamine antagonists, diphenhydramine,  and alcohol.   2. Consider dedicated sleep psychology referral if insomnia is of clinical concern.   3. A follow up appointment will be scheduled in the Sleep Clinic at [facility]. The referring provider will be notified of the results.      I certify that I have reviewed the entire raw data recording prior to the issuance of this report in accordance with the Standards of Accreditation of the American Academy of Sleep Medicine (AASM)      Larey Seat, MD 07-02-2016  Diplomat, American Board of Psychiatry and Neurology  Diplomat, American  Board of [place] Director, [employer] [facility] [employer]

## 2016-07-02 NOTE — Telephone Encounter (Signed)
Patient has PLMs , which lead to sleep interruption, correlated to her clinical observation of RLS. Will need follow up in clinic, No apnea.

## 2016-07-03 NOTE — Telephone Encounter (Signed)
I spoke to pt and advised her that Dr. Brett Fairy reviewed her sleep study results and found that pt has PLMs, which lead to sleep interruption, correlated to her clinical observation of RLS. There was no apnea seen in her sleep study. Pt is agreeable to a follow up appt with Dr. Brett Fairy. Appt made for 08/19/16 at 2:00pm. Pt verbalized understanding of results. Pt had no questions at this time but was encouraged to call back if questions arise.

## 2016-07-24 NOTE — Progress Notes (Signed)
45 y.o. [id number] Single Caucasian female here for annual exam.    Having neuropathy issues.  Seeing providers at [facility].  Will be doing a vitamin infusion to try to treat this.   Had weight loss surgery and weight is stable now.   Menses are eradic.  Can be 16 days in between first day of one and first day of the next cycle. Then up to 18 - 20 days in between cycles. Now is 1.5 weeks late.   Having night sweats.   Having breast discharge for many years. Has one duct discharge on the right and two ducts on the left.  Had testing of the fluid 9 years ago.  Has had normal prolactin and TSH.  Mother and maternal GM with breast cancer.  Patient states a change in her right breast exam.  Got married in October 2017.  Female partner.   PCP:  Colin Benton, DO  Patient's last menstrual period was 06/26/2016 (approximate).     Period Pattern: (!) Irregular     Sexually active: Yes.   female The current method of family planning is none(female partner).    Exercising: No.   Smoker:  no  Health Maintenance: Pap:  04-29-14 Neg:Neg HR HPV History of abnormal Pap:  no MMG:  07-26-15 Density C/Neg/BiRads1:TBC.   Colonoscopy:  n/a BMD:   n/a  Result  n/a TDaP:  04-09-13 Gardasil:   N/A HIV:  Neg 2016 Hep C:  Neg 2016 Screening Labs:  Hb today: [facility], Urine today: Neg   reports that she has never smoked. She has never used smokeless tobacco. She reports that she drinks about 1.2 - 2.4 oz of alcohol per week . She reports that she does not use drugs.  Past Medical History:  Diagnosis Date  . Anxiety and depression   . Complication of anesthesia    has been hard to wake up post-op  . Dental crowns present   . Diabetes ([place])    resolved with gastric bypass  . DVT of lower extremity (deep venous thrombosis) ([place]) 2012   LLE  . Dysmenorrhea    hx endometriosis and fibroid  . Headache(784.0)    tension or sinus  . History of MRSA infection prior to 2012   lasted 5-6 yr.  . Jaw  snapping    states jaw pops  . Left foot drop 2016   --[place] [facility]  . Neuropathy ([place]) 2015   hands, feet and face--[place] neurolog[facility]  . Right knee pain    hx meniscal injury, chondromalacia, OA    Past Surgical History:  Procedure Laterality Date  . CHOLECYSTECTOMY  1990's  . DILITATION & CURRETTAGE/HYSTROSCOPY WITH NOVASURE ABLATION N/A 05/30/2014   Procedure: DILATATION & CURETTAGE/HYSTEROSCOPY WITH attempted [place] and with IUD removal ;  Surgeon: Jamey Reas de Berton Lan, MD;  Location: [facility];  Service: Gynecology;  Laterality: N/A;  . GASTRIC ROUX-EN-Y N/A 11/02/2013   Procedure: LAPAROSCOPIC ROUX-EN-Y GASTRIC BYPASS WITH UPPER ENDOSCOPY;  Surgeon: Gayland Curry, MD;  Location: [facility];  Service: General;  Laterality: N/A;  . HAND SURGERY Left    X 3 - spider bite and MRSA infection  . HYSTEROSCOPY WITH RESECTOSCOPE N/A 05/30/2014   Procedure: HYSTEROSCOPY WITH RESECTOSCOPE;  Surgeon: Jamey Reas de Berton Lan, MD;  Location: [facility];  Service: Gynecology;  Laterality: N/A;  . KNEE ARTHROSCOPY Right 12/29/2012   Procedure: RIGHT KNEE ARTHROSCOPY  PARTIAL MEDIAL AND LATERAL MENISECTOMY WITH CHONDROPLASTY;  Surgeon: Collier Salina  Autumn Patty, MD;  Location: [facility];  Service: Orthopedics;  Laterality: Right;  . PELVIC LAPAROSCOPY  1990's   d/t endometriosis  . TONSILLECTOMY  as child  . WRIST SURGERY Right 2005    Current Outpatient Prescriptions  Medication Sig Dispense Refill  . Calcium-Vitamin D-Vitamin K (CHEWABLE CALCIUM PO) Take 1 tablet by mouth daily.    . DULoxetine (CYMBALTA) 60 MG capsule Take 120 mg by mouth every morning.     . Melatonin 1 MG SUBL Place 3 mg under the tongue at bedtime. 1 tablet 2  . Multiple Vitamin (MULTIVITAMIN) tablet Take 1 tablet by mouth daily.     No current facility-administered medications for this visit.     Family History  Problem Relation Age of Onset  . Diabetes Mother     Living   . Kidney disease Mother   . Hypertension Mother   . Cancer Mother     breast cancer  . Breast cancer Mother 16    dx'd with breast ca x2  . Cancer Maternal Grandmother   . Diabetes Maternal Grandmother 40    dec  . Breast cancer Maternal Grandmother   . Arthritis Father   . Diabetes Maternal Grandfather   . Stroke Maternal Grandfather   . Healthy Sister     x2  . Healthy Daughter   . Thyroid disease Neg Hx     ROS:  Pertinent items are noted in HPI.  Otherwise, a comprehensive ROS was negative.  Exam:   BP 118/70 (BP Location: Right Arm, Patient Position: Sitting, Cuff Size: Large)   Pulse 66   Resp 16   Ht 5' 7.5" (1.715 m)   Wt 200 lb (90.7 kg)   LMP 06/26/2016 (Approximate)   BMI 30.86 kg/m     General appearance: alert, cooperative and appears stated age Head: Normocephalic, without obvious abnormality, atraumatic Neck: no adenopathy, supple, symmetrical, trachea midline and thyroid normal to inspection and palpation Lungs: clear to auscultation bilaterally Breasts: left breast with normal appearance, no masses or tenderness, No nipple retraction or dimpling, No nipple discharge or bleeding, No axillary or supraclavicular adenopathy.  Right breast with absence of breast tissue at 10:00 causing a ridge like effect.  No nodes, retractions, or nipple discharge. Heart: regular rate and rhythm Abdomen: soft, non-tender; no masses, no organomegaly Extremities: extremities normal, atraumatic, no cyanosis or edema Skin: Skin color, texture, turgor normal. No rashes or lesions Lymph nodes: Cervical, supraclavicular, and axillary nodes normal. No abnormal inguinal nodes palpated Neurologic: Grossly normal  Pelvic: External genitalia:  no lesions              Urethra:  normal appearing urethra with no masses, tenderness or lesions              Bartholins and Skenes: normal                 Vagina: normal appearing vagina with normal color and discharge, no lesions               Cervix: no lesions              Pap taken: No. Bimanual Exam:  Uterus:  normal size, contour, position, consistency, mobility, non-tender              Adnexa: no mass, fullness, tenderness              Rectal exam: Yes.  .  Confirms.  Anus:  normal sphincter tone, no lesions  Chaperone was present for exam.  Assessment:   Well woman visit with normal exam. Status post endometrial ablation.    Metrorrhagia. Status post gasric bypass. Neuropathy of undetermined etiology.  Hx DVT on combined oral contraceptives. FH of breast cancer.  Change in right breast exam.  Mass?  Plan: Dx bilateral mammogram and right breast ultrasound.  Recommended self breast exam.  Pap and HR HPV as above. Guidelines for Calcium, Vitamin D, regular exercise program including cardiovascular and weight bearing exercise. Referral for genetics counseling and testing.  Return for sonohysterogram and EMB.  Follow up annually and prn.      After visit summary provided.

## 2016-07-25 DIAGNOSIS — E663 Overweight: Secondary | ICD-10-CM | POA: Diagnosis not present

## 2016-07-25 DIAGNOSIS — Z9884 Bariatric surgery status: Secondary | ICD-10-CM | POA: Diagnosis not present

## 2016-07-25 DIAGNOSIS — R202 Paresthesia of skin: Secondary | ICD-10-CM | POA: Diagnosis not present

## 2016-07-25 DIAGNOSIS — K912 Postsurgical malabsorption, not elsewhere classified: Secondary | ICD-10-CM | POA: Diagnosis not present

## 2016-07-29 ENCOUNTER — Ambulatory Visit (INDEPENDENT_AMBULATORY_CARE_PROVIDER_SITE_OTHER): Payer: BLUE CROSS/BLUE SHIELD | Admitting: Obstetrics and Gynecology

## 2016-07-29 ENCOUNTER — Encounter: Payer: Self-pay | Admitting: Obstetrics and Gynecology

## 2016-07-29 VITALS — BP 118/70 | HR 66 | Resp 16 | Ht 67.5 in | Wt 200.0 lb

## 2016-07-29 DIAGNOSIS — Z803 Family history of malignant neoplasm of breast: Secondary | ICD-10-CM

## 2016-07-29 DIAGNOSIS — N6310 Unspecified lump in the right breast, unspecified quadrant: Secondary | ICD-10-CM | POA: Diagnosis not present

## 2016-07-29 DIAGNOSIS — N921 Excessive and frequent menstruation with irregular cycle: Secondary | ICD-10-CM | POA: Diagnosis not present

## 2016-07-29 DIAGNOSIS — Z0000 Encounter for general adult medical examination without abnormal findings: Secondary | ICD-10-CM | POA: Diagnosis not present

## 2016-07-29 DIAGNOSIS — Z01419 Encounter for gynecological examination (general) (routine) without abnormal findings: Secondary | ICD-10-CM

## 2016-07-29 LAB — POCT URINALYSIS DIPSTICK
BILIRUBIN UA: NEGATIVE
GLUCOSE UA: NEGATIVE
Ketones, UA: NEGATIVE
Leukocytes, UA: NEGATIVE
Nitrite, UA: NEGATIVE
Protein, UA: NEGATIVE
RBC UA: NEGATIVE
Urobilinogen, UA: NEGATIVE
pH, UA: 5

## 2016-07-29 NOTE — Progress Notes (Signed)
Scheduled patient while in office for bilateral diagnostic mammogram with right breast ultrasound at the [facility] on 08/06/2016 at 12:20 pm. Patient is agreeable to date and time. Placed in mammogram hold.

## 2016-07-29 NOTE — Patient Instructions (Signed)

## 2016-07-31 ENCOUNTER — Telehealth: Payer: Self-pay | Admitting: Genetic Counselor

## 2016-07-31 ENCOUNTER — Encounter: Payer: Self-pay | Admitting: Genetic Counselor

## 2016-07-31 NOTE — Telephone Encounter (Signed)
Pt confirmed appt, verified demo and insurance, mailed pt letter, and in basket referring provider appt date/time. °

## 2016-08-01 ENCOUNTER — Other Ambulatory Visit: Payer: BLUE CROSS/BLUE SHIELD | Admitting: Obstetrics and Gynecology

## 2016-08-01 ENCOUNTER — Other Ambulatory Visit: Payer: BLUE CROSS/BLUE SHIELD

## 2016-08-01 DIAGNOSIS — R202 Paresthesia of skin: Secondary | ICD-10-CM | POA: Diagnosis not present

## 2016-08-01 DIAGNOSIS — K912 Postsurgical malabsorption, not elsewhere classified: Secondary | ICD-10-CM | POA: Diagnosis not present

## 2016-08-06 ENCOUNTER — Ambulatory Visit
Admission: RE | Admit: 2016-08-06 | Discharge: 2016-08-06 | Disposition: A | Discharge status: Home / Self Care | Payer: BLUE CROSS/BLUE SHIELD | Source: Ambulatory Visit | Attending: Obstetrics and Gynecology | Admitting: Obstetrics and Gynecology

## 2016-08-06 DIAGNOSIS — N6310 Unspecified lump in the right breast, unspecified quadrant: Secondary | ICD-10-CM

## 2016-08-06 DIAGNOSIS — R928 Other abnormal and inconclusive findings on diagnostic imaging of breast: Secondary | ICD-10-CM | POA: Diagnosis not present

## 2016-08-06 DIAGNOSIS — N6489 Other specified disorders of breast: Secondary | ICD-10-CM | POA: Diagnosis not present

## 2016-08-07 ENCOUNTER — Other Ambulatory Visit: Payer: BLUE CROSS/BLUE SHIELD

## 2016-08-07 ENCOUNTER — Other Ambulatory Visit: Payer: BLUE CROSS/BLUE SHIELD | Admitting: Obstetrics and Gynecology

## 2016-08-08 ENCOUNTER — Encounter: Payer: Self-pay | Admitting: Obstetrics and Gynecology

## 2016-08-08 ENCOUNTER — Ambulatory Visit (INDEPENDENT_AMBULATORY_CARE_PROVIDER_SITE_OTHER): Payer: BLUE CROSS/BLUE SHIELD | Admitting: Obstetrics and Gynecology

## 2016-08-08 ENCOUNTER — Ambulatory Visit (INDEPENDENT_AMBULATORY_CARE_PROVIDER_SITE_OTHER): Payer: BLUE CROSS/BLUE SHIELD

## 2016-08-08 ENCOUNTER — Other Ambulatory Visit: Payer: Self-pay | Admitting: Obstetrics and Gynecology

## 2016-08-08 DIAGNOSIS — N921 Excessive and frequent menstruation with irregular cycle: Secondary | ICD-10-CM

## 2016-08-08 NOTE — Progress Notes (Signed)
Encounter reviewed by Dr. Brook Amundson C. Silva.  

## 2016-08-08 NOTE — Progress Notes (Signed)
Patient ID: [name], female   DOB: [birth year], 45 y.o.   MRN: [id number] GYNECOLOGY  VISIT   HPI: 45 y.o.   Single  Caucasian  female   [id number] with Patient's last menstrual period was 07/30/2016 (exact date).   here for sonohysterogram for metrorrhagia.    Hx endometrial ablation.  Menses 16 days apart at times. Last menses was late.   Has genetic testing in February 2018.  GYNECOLOGIC HISTORY: Patient's last menstrual period was 07/30/2016 (exact date). Contraception:  Same sex partner Menopausal hormone therapy:  n/a Last mammogram: 08-06-16 Diag.Bil.& Rt.Br.U/S--Density B/Neg/BiRads2:TBC Pap:  04/29/14 - neg, neg HR HPV        OB History    Gravida Para Term Preterm AB Living   3 1 1   2 1    SAB TAB Ectopic Multiple Live Births   2                 Patient Active Problem List   Diagnosis Date Noted  . Multinodular goiter 04/24/2015  . Mood disorder ([place]) 04/12/2015  . Peroneal mononeuropathy 08/22/2014  . Left foot drop 07/21/2014  . Menorrhagia with irregular cycle 04/29/2014  . S/P gastric bypass 11/02/2013  . Hyperlipidemia 10/08/2013  . Primary localized osteoarthrosis, lower leg 07/06/2013  . Obesity, BMI unknown 04/23/2013  . IUD contraception - inserted summer 2013 04/09/2013    Past Medical History:  Diagnosis Date  . Anxiety and depression   . Complication of anesthesia    has been hard to wake up post-op  . Dental crowns present   . Diabetes ([place])    resolved with gastric bypass  . DVT of lower extremity (deep venous thrombosis) ([place]) 2012   LLE  . Dysmenorrhea    hx endometriosis and fibroid  . Headache(784.0)    tension or sinus  . History of MRSA infection prior to 2012   lasted 5-6 yr.  . Jaw snapping    states jaw pops  . Left foot drop 2016   --[place] [facility]  . Neuropathy ([place]) 2015   hands, feet and face--[place] [facility]  . Right knee pain    hx meniscal injury, chondromalacia, OA    Past Surgical History:  Procedure  Laterality Date  . CHOLECYSTECTOMY  1990's  . DILITATION & CURRETTAGE/HYSTROSCOPY WITH NOVASURE ABLATION N/A 05/30/2014   Procedure: DILATATION & CURETTAGE/HYSTEROSCOPY WITH attempted [place] and with IUD removal ;  Surgeon: Jamey Reas de Berton Lan, MD;  Location: [facility];  Service: Gynecology;  Laterality: N/A;  . GASTRIC ROUX-EN-Y N/A 11/02/2013   Procedure: LAPAROSCOPIC ROUX-EN-Y GASTRIC BYPASS WITH UPPER ENDOSCOPY;  Surgeon: Gayland Curry, MD;  Location: [facility];  Service: General;  Laterality: N/A;  . HAND SURGERY Left    X 3 - spider bite and MRSA infection  . HYSTEROSCOPY WITH RESECTOSCOPE N/A 05/30/2014   Procedure: HYSTEROSCOPY WITH RESECTOSCOPE;  Surgeon: Jamey Reas de Berton Lan, MD;  Location: [facility];  Service: Gynecology;  Laterality: N/A;  . KNEE ARTHROSCOPY Right 12/29/2012   Procedure: RIGHT KNEE ARTHROSCOPY  PARTIAL MEDIAL AND LATERAL MENISECTOMY WITH CHONDROPLASTY;  Surgeon: Hessie Dibble, MD;  Location: [facility];  Service: Orthopedics;  Laterality: Right;  . PELVIC LAPAROSCOPY  1990's   d/t endometriosis  . TONSILLECTOMY  as child  . WRIST SURGERY Right 2005    Current Outpatient Prescriptions  Medication Sig Dispense Refill  . Calcium-Vitamin D-Vitamin K (CHEWABLE CALCIUM PO) Take 1 tablet by mouth daily.    Marland Kitchen  DULoxetine (CYMBALTA) 60 MG capsule Take 120 mg by mouth every morning.     . Melatonin 1 MG SUBL Place 3 mg under the tongue at bedtime. 1 tablet 2  . Multiple Vitamin (MULTIVITAMIN) tablet Take 1 tablet by mouth daily.     No current facility-administered medications for this visit.      ALLERGIES: Other; Sulfa antibiotics; Bactrim [sulfamethoxazole-trimethoprim]; and Ibuprofen  Family History  Problem Relation Age of Onset  . Diabetes Mother     Living  . Kidney disease Mother   . Hypertension Mother   . Cancer Mother     breast cancer  . Breast cancer Mother 34    dx'd with breast ca x2  . Cancer  Maternal Grandmother   . Diabetes Maternal Grandmother 47    dec  . Breast cancer Maternal Grandmother   . Arthritis Father   . Diabetes Maternal Grandfather   . Stroke Maternal Grandfather   . Healthy Sister     x2  . Healthy Daughter   . Thyroid disease Neg Hx     Social History   Social History  . Marital status: Single    Spouse name: N/A  . Number of children: N/A  . Years of education: N/A   Occupational History  . Not on file.   Social History Main Topics  . Smoking status: Never Smoker  . Smokeless tobacco: Never Used  . Alcohol use 1.2 - 2.4 oz/week    2 - 4 Glasses of wine per week     Comment: 2-4 glasses of wine a few times per month  . Drug use: No  . Sexual activity: Yes    Partners: Female   Other Topics Concern  . 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  PHYSICAL EXAMINATION:    BP 122/80 (BP Location: Right Arm, Patient Position: Sitting, Cuff Size: Large)   Pulse 60   Ht 5' 7.5" (1.715 m)   Wt 200 lb (90.7 kg)   LMP 07/30/2016 (Exact Date)   BMI 30.86 kg/m     General appearance: alert, cooperative and appears stated age  Technique:  Both transabdominal and transvaginal ultrasound examinations of the pelvis were performed. Transabdominal technique was performed for global imaging of the pelvis including uterus, ovaries, adnexal regions, and pelvic cul-de-sac. It was necessary to proceed with endovaginal exam following the abdominal ultrasound.  Transabdominal exam to visualize the endometrium and adnexa.  Color and duplex Doppler ultrasound was utilized to evaluate blood flow to the ovaries.   Pelvic ultrasound -  Uterus with inhomogeneous  myometrium. Endometrium difficult to measure.  Cystic spaces.  Cannot rule out endometrial versus myometrial mass. Normal ovaries.  No free fluid.  Procedure - sonohysterogram Consent performed. Speculum placed in vagina. Sterile prep of cervix with Hibiclens. Cannula placed inside endometrial cavity without difficulty. Speculum removed. Sterile saline injected. Filling defect on the right versus scarring from endometrial ablation noted.  Both right and left endometrial sides are distorted.  No abnormal blood flow. Cannula removed.  No complication.   Procedure - endometrial biopsy Consent performed. Speculum place in vagina.  Sterile prep of cervix with Hibiclens. Tenaculum to anterior cervical lip. Paracervical block with 10 cc 1% lidocaine, lot number [id number], exp 02/21. Pipelle placed to    6      cm  twice. Tissue obtained and sent to pathology. Speculum removed.  No complications. Minimal EBL.   Chaperone was present for exam.  ASSESSMENT  Metrorrhagia.   Status post endometrial ablation.  Ultrasound today showing uterine fibrosis versus adenomyosis, degenerating fibroid, or possible endometrial mass.  No abnormal blood flow noted. I suspect that the major reason for the metrorrhagia is perimenopausal change due to the fact that her last menstrual cycle was late. Hx DVT.  Hx gastric bypass surgery.  Hx endometriosis.   PLAN  Tylenol 1000 mg to patient.  Follow up EMB.  We discussed possible course of progesterone therapy with [facility]ronor.  I would do this for a short course to see if there is improvement in her bleeding profile. Hysterectomy is also an option and is the only definitive way to make a diagnosis of the endometrial and myometrial changes.    An After Visit Summary was printed and given to the patient.  ___25___ minutes face to face time of which over 50% was spent in counseling.

## 2016-08-08 NOTE — Patient Instructions (Signed)

## 2016-08-10 ENCOUNTER — Encounter: Payer: Self-pay | Admitting: Obstetrics and Gynecology

## 2016-08-12 DIAGNOSIS — D6852 Prothrombin gene mutation: Secondary | ICD-10-CM

## 2016-08-12 HISTORY — DX: Prothrombin gene mutation: D68.52

## 2016-08-15 ENCOUNTER — Other Ambulatory Visit: Payer: Self-pay | Admitting: *Deleted

## 2016-08-15 MED ORDER — NORETHINDRONE 0.35 MG PO TABS: 1.0000 | ORAL_TABLET | Freq: Every day | ORAL | 0 refills | Status: DC

## 2016-08-19 ENCOUNTER — Encounter: Payer: Self-pay | Admitting: Neurology

## 2016-08-19 ENCOUNTER — Ambulatory Visit (INDEPENDENT_AMBULATORY_CARE_PROVIDER_SITE_OTHER): Payer: BLUE CROSS/BLUE SHIELD | Admitting: Neurology

## 2016-08-19 VITALS — BP 120/68 | HR 84 | Resp 20 | Ht 68.0 in | Wt 203.0 lb

## 2016-08-19 DIAGNOSIS — D509 Iron deficiency anemia, unspecified: Secondary | ICD-10-CM | POA: Diagnosis not present

## 2016-08-19 DIAGNOSIS — G4761 Periodic limb movement disorder: Secondary | ICD-10-CM

## 2016-08-19 DIAGNOSIS — Z9884 Bariatric surgery status: Secondary | ICD-10-CM | POA: Diagnosis not present

## 2016-08-19 MED ORDER — PRAMIPEXOLE DIHYDROCHLORIDE 0.25 MG PO TABS: 0.2500 mg | ORAL_TABLET | Freq: Three times a day (TID) | ORAL | 5 refills | Status: DC

## 2016-08-19 NOTE — Progress Notes (Signed)
SLEEP MEDICINE CLINIC   Provider:  Larey Seat, M D  Referring Provider: Lucretia Kern, DO Primary Care Physician:  Lucretia Kern., DO  No chief complaint on file.   HPI:  [name] is a 46 y.o. female , seen here as a referral  from Dr. Maudie Mercury for An evaluation of sleep quality, restless sleep, daytime somnolence. 08/19/16  Chief complaint according to patient : " I may have RLS, I am followed for neuropathy by a specialist a [facility], for discomfort in hands, legs and face."  [name] has a history of morbid obesity before she underwent gastric bypass surgery,but  with  exacerbated weight loss  her diabetes resolved postsurgically,. Prior to the gastric bypass surgery she had acquired MRSA infection and struggled with an for 5 or 6 years, she suffered frequent tension and sinus headaches, has a history of endometriosis and fibroid dysmenorrhea, a DVT left lower extremity in 2012, and anxiety and depression were noted. Also complications of anesthesia were noted, the patient has a hard time waking up after surgery.   Sleep habits are as follows: Usually she goes to the bedroom and retreats by about 9.30 PM and will read for about 30 minutes or so. By 9:55  the lights are off and the patient is asleep. Sometimes she has creepy crawly sensations in the lower extremities which she cannot attribute to specific daytime activity levels and there are not a nightly occurrence. 4 nights  per week. She sleeps sometimes on her back sometimes on her signs, she uses a lot of pillows to prop herself into comfortable position, and one pillow will be between the knees up to 3 under the head. The bedroom is cool, quiet and dark and she shares a bedroom with her partner. She does have trouble to maintain sleep and wakes up several times at night the first time usually between 2 AM and 3 AM and then again between 4 and 5. Sometimes a period of wakefulness will spend 20 minutes sometimes up to an hour. She  denies any ruminating thoughts, bodies, there are no palpitations or diaphoresis, there is no pain per se waking her .There is an underlying discomfort all the time, related to her neuropathy diagnosis. Her parnter has not noticed her to kick or act out in sleep.  It is not the urge to urinate that wakes her but she often uses this break in her sleep pattern to go to the bathroom. She reports that she occasionally snores but she does have witnessed to her sleep pattern, her partner has witnessed snoring and also sleep talking. She rises at 5.45 by alarm, usually at a time when she is dreaming.  Sleep medical history and family sleep history:  Her mother is using a BiPAP machine for the treatment of sleep apnea, one sister is very overweight but is not sure if she suffers from obstructive sleep apnea.  The patient slept walked in childhood, she suffered from frequent night terrors well into her early 77s. No enuresis,. Weight loss surgery allowed for loosing 115 pounds, surgery date 3- 14- 2015.  Social history:  Single but not living alone. The patient has a daytime office job but her work environment does not provide natural daylight. He usually leaves her office for lunchtime. She drinks coffee throughout the day about 4-5 months and after denied decaffeinated coffee. He does not drink sodas nor ice tea. No history of tobacco use. She drinks wine but not hard liquor. 2-3  glasses a week at dinnertime.  Interval history from 08/19/2016, [name] underwent a polysomnography on 06/21/2016 based on her complained about loss of sleep quality, restless sleep restless legs and daytime somnolence. There were 0 apneas noted, the AHI was 0.0 oxygen desaturation was not noted, there were frequent periodic limb movements and some arousals related to those at 3.1 per hour of sleep. Arms and legs were moving. We meet to discuss and treat.   The following are Dr Serita Grit notes regarding neuropathy care for [name]. : History: [name] is a 46 y.o. right-handed Caucasian female with depression/anxiety and prior obesity s/p gastric bypass (March 2015, lost 120lb+), and arthritis of the knees returning to the clinic for follow-up of left foot drop and generalized paresthesias.  The patient was accompanied to the clinic by self.  History of present illness: Around late September 2015, she developed numbness of her 4th toe which progressed to involve the dorsum of the foot over a few weeks.  Soon, she noticed tingling/numbness over the left lateral lower leg.  Early December, she developed similar numbness/tingling over the right leg (toes, dorsum of the foot, and lateral leg).  Within a week, left lateral thigh started feeling different, she developed burning pain of her left > right hand and forearm, and noted numbness of the nose and lips.  She is having greater difficulty with walking due to left foot weakness, as if she is tripping over her own toes and is falling about once every other day.  She is able to get up by herself and has not sustained traumatic injuries, but had bruised herself quite a bit.  There is no preceding lightheadedness, changes in vision, or palpitations. She has been having a lot of cramp and muscle spasms in her left lower leg. She endorses dry eyes and dry mouth. She denies any bowel/bladder incontinence, drooling, difficulty swallowing.  Her taste has also been more bland lately. She was diabetic from 2011-2015 with HbA1c ranging ~8.4% but this resolved after her weight loss surgery.   She has been seeing Dr. Tamala Julian with Sports Medicine for ongoing problems with her knees and during his evaluation in early December, she had raised these new symptoms to him and her exam showed new left foot drop.  MRI of the lumbar spine wo contrast was ordered and did not show any nerve root impingement. She was given a trial of prednisone taper over one week, and she denies any significant  change.  UPDATE 08/22/2014:  Patient is here to discuss the results of testing to-date, including EMG, CSF, imaging, and serology testing.  Notable findings include subacute bilateral common peroneal mononeuropathy.  Serology testing and CSF has been normal.   She feels that her hands started burning and tingling which was severe a few days ago.  She is falling less but is stumbling more frequently.  Her legs are stable, without any worsening.  She continues to have numbness of the face especially nose.  Morrie Sheldon.  Review of Systems: Out of a complete 14 system review, the patient complains of only the following symptoms, and all other reviewed systems are negative.   Epworth score is endorsed at 9 points, fatigue severity at 44 points, the restless leg question here by [facility] endorsed at mild to moderate impact the RL as 6 rating scale endorsed at mild to moderate impact periods she describes the main quality is aching pain usually not severe in daytime even when at rest,  but correlating to high degrees of daytime sleepiness. She also endorsed joint pain and joint swelling headaches numbness weakness and sleepiness. Depression on Cymbalta.    Social History   Social History  . Marital status: Single    Spouse name: N/A  . Number of children: N/A  . Years of education: N/A   Occupational History  . Not on file.   Social History Main Topics  . Smoking status: Never Smoker  . Smokeless tobacco: Never Used  . Alcohol use 1.2 - 2.4 oz/week    2 - 4 Glasses of wine per week     Comment: 2-4 glasses of wine a few times per month  . Drug use: No  . Sexual activity: Yes    Partners: Female   Other Topics Concern  . 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 Diabetes Mother     Living  . Kidney disease Mother   . Hypertension Mother   . Cancer Mother     breast cancer  . Breast cancer Mother 46    dx'd with breast ca x2  . Cancer Maternal Grandmother   . Diabetes Maternal Grandmother 9    dec  . Breast cancer Maternal Grandmother   . Arthritis Father   . Diabetes Maternal Grandfather   . Stroke Maternal Grandfather   . Healthy Sister     x2  . Healthy Daughter   . Thyroid disease Neg Hx     Past Medical History:  Diagnosis Date  . Anxiety and depression   . Complication of anesthesia    has been hard to wake up post-op  . Dental crowns present   . Diabetes ([place])    resolved with gastric bypass  . DVT of lower extremity (deep venous thrombosis) ([place]) 2012   LLE  . Dysmenorrhea    hx endometriosis and fibroid  . Headache(784.0)    tension or sinus  . History of MRSA infection prior to 2012   lasted 5-6 yr.  . Jaw snapping    states jaw pops  . Left foot drop 2016   --[place] [facility]  . Neuropathy ([place]) 2015   hands, feet and face--[place] [facility]  . Right knee pain    hx meniscal injury, chondromalacia, OA    Past Surgical History:  Procedure Laterality Date  . CHOLECYSTECTOMY  1990's  . DILITATION & CURRETTAGE/HYSTROSCOPY WITH NOVASURE ABLATION N/A 05/30/2014   Procedure: DILATATION & CURETTAGE/HYSTEROSCOPY WITH attempted [place] and with IUD removal ;  Surgeon: Jamey Reas de Berton Lan, MD;  Location: [facility];  Service: Gynecology;  Laterality: N/A;  . GASTRIC ROUX-EN-Y N/A 11/02/2013   Procedure: LAPAROSCOPIC ROUX-EN-Y GASTRIC BYPASS WITH UPPER ENDOSCOPY;  Surgeon: Gayland Curry, MD;  Location: [facility];  Service: General;  Laterality: N/A;  . HAND SURGERY Left    X 3 - spider bite and MRSA infection  . HYSTEROSCOPY WITH RESECTOSCOPE N/A 05/30/2014   Procedure: HYSTEROSCOPY WITH RESECTOSCOPE;  Surgeon: Jamey Reas de  Berton Lan, MD;  Location: [facility];  Service: Gynecology;  Laterality: N/A;  . KNEE ARTHROSCOPY Right 12/29/2012   Procedure: RIGHT KNEE ARTHROSCOPY  PARTIAL MEDIAL AND LATERAL MENISECTOMY WITH CHONDROPLASTY;  Surgeon: Hessie Dibble, MD;  Location: [facility];  Service: Orthopedics;  Laterality: Right;  . PELVIC LAPAROSCOPY  1990's   d/t endometriosis  . TONSILLECTOMY  as child  . WRIST SURGERY Right 2005    Current Outpatient Prescriptions  Medication Sig Dispense Refill  . Calcium-Vitamin D-Vitamin K (CHEWABLE CALCIUM PO) Take 1 tablet by mouth daily.    . DULoxetine (CYMBALTA) 60 MG capsule Take 120 mg by mouth every morning.     . Melatonin 1 MG SUBL Place 3 mg under the tongue at bedtime. 1 tablet 2  . Multiple Vitamin (MULTIVITAMIN) tablet Take 1 tablet by mouth daily.    . norethindrone (ORTHO MICRONOR) 0.35 MG tablet Take 1 tablet (0.35 mg total) by mouth daily. 4 Package 0   No current facility-administered medications for this visit.     Allergies as of 08/19/2016 - Review Complete 08/10/2016  Allergen Reaction Noted  . Other Hives 12/26/2014  . Sulfa antibiotics Other (See Comments) 09/22/2012  . Bactrim [sulfamethoxazole-trimethoprim] Other (See Comments) 10/26/2013  . Ibuprofen Other (See Comments) 05/19/2015    Vitals: LMP 07/30/2016 (Exact Date)  Last Weight:  Wt Readings from Last 1 Encounters:  08/08/16 200 lb (90.7 kg)   [id number] is no height or weight on file to calculate BMI.     Last Height:   Ht Readings from Last 1 Encounters:  08/08/16 5' 7.5" (1.715 m)    Physical exam:  General: The patient is awake, alert and appears not in acute distress. The patient is well groomed. Head: Normocephalic, atraumatic. Neck is supple. Mallampati 4,  neck circumference: 14.5 . Nasal airflow stuffy, but patent. , TMJ click left side evident . Status post TMJ surgery. The patient wore a mouth guard for years for bruxism  prophylaxis. Retrognathia is not seen.  Cardiovascular:  Regular rate and rhythm , without  murmurs or carotid bruit, and without distended neck veins. Respiratory: Lungs are clear to auscultation. Skin:  Without evidence of edema, or rash- mottled appearance, a little waxy.   Neurologic exam : The patient is awake and alert, oriented to place and time.   Memory subjective  described as intact.  Speech is fluent,  without dysarthria, dysphonia or aphasia.  Mood and affect are appropriate.  Cranial nerves: Pupils are equal and briskly reactive to light. Extraocular movements  in vertical and horizontal planes intact and without nystagmus. Visual fields by finger perimetry are intact. Hearing to finger rub intact.   Facial sensation intact to fine touch.  Facial motor strength is symmetric and tongue and uvula move midline. Shoulder shrug was symmetrical.  I noted that the patient has normal coordination without tremor ataxia or dysmetria on finger-to-nose test but she does have a rather weak grip strength bilaterally. She also has a week pinch strength. Detailed Motor and sensory exam were deferredGait and station: Patient walks without assistive device and is able unassisted to climb up to the exam table.  Stance is stable and normal.  No foot drop .Deep tendon reflexes: in the upper and lower extremities are symmetric and intact. Babinski maneuver response is downgoing.  The patient was advised of the nature of the diagnosed sleep disorder , the treatment options and risks for general a health and wellness arising from not treating  the condition.  I spent more than 30 minutes of face to face time with the patient. Greater than 50% of time was spent in counseling and coordination of care. We have discussed the diagnosis and differential and I answered the patient's questions.    I reviewed Dr. Julianne Rice referral note, and also spoke to the patient at length. Her sleep really has deteriorated over  the last 2-1/2 years this correlates to the time post weight loss surgery. Her specialist at [facility] have tried to find a deficiency either for trace elements or vitamins but have been unsuccessful thus far. Oral supplements are not likely to help her if there is a absorption problem. Restless legs can develop with iron deficiency before anemia becomes evident and for this reason and her history of weight loss surgery I will order a ferritin, total iron binding capacity and iron saturation panel. She has a history of iron deficiency anemia due to dysmenorrhagia.    Assessment:  After physical and neurologic examination, review of laboratory studies,  Personal review of imaging studies, reports of other /same  Imaging studies ,  Results of polysomnography/ neurophysiology testing and pre-existing records as far as provided in visit., my assessment is   1) neuropathy with overlap symptoms reminiscent of restless leg syndrome. She describes mostly a deep ache but not necessarily a creepy crawly sensation. Sometimes burning, sometimes it's tingling but it is not associated with the irresistible urge to move. She does not report mouth burning, does not chew on ice. Likely related to a loss of trace elements.   2) the sleep pattern may be fragmented by unconscious discomfort, not actual pain. To evaluate further and attended sleep study would be needed. It is essential that the sleep study is attended to witness if the patient arouses from sleep and which physiologic functions may have let to the arousal. This cannot be seen in a home sleep test.  3) the patient's partner has witnessed her to snore, there is a chance that she also has apnea.    Plan:  Treatment plan and additional workup :  Ferritin, TIBC and iron.  PSG with special attention to PLMs and arousals.  Stay on Cymbalta.  RV after PSG interpretation.  Add melatonin, sublingual dropper   Asencion Partridge Angeleigh Chiasson  MD  08/19/2016   CC: Lucretia Kern, Do [address] [place], [place] [address]

## 2016-08-19 NOTE — Patient Instructions (Signed)
Malnutrition Introduction Malnutrition is any condition in which nutrition is poor. There are many forms of malnutrition. A common form is having too little of one kind of nutrient (nutritional deficiency). Nutrients include proteins, minerals, carbohydrates, fats, and vitamins. They provide the body with energy and keep the body working normally. Malnutrition ranges from mild to severe. The condition affects the body's defense system (immune system). Because of this, people who are malnourished are more likely to develop health problems and get sick. What are the causes? Causes of malnutrition include:  Eating an unbalanced diet.  Eating too much of certain foods.  Eating too little.  Conditions that decrease the body's ability to use nutrients. What increases the risk? Risk factors include:  Pregnancy and lactation. Women who are pregnant may become malnourished if they do not increase their nutrient intake. They are also susceptible to folic acid deficiency.  Increasing age. The body's ability to absorb nutrients decreases with age. This can contribute to iron, calcium, and vitamin D deficiencies.  Alcohol or drug dependency. Addiction often leads to a lifestyle in which proper nourishment is ignored. Dependency can also hurt the metabolism and the body's ability to absorb nutrients. Alcoholism is a major cause of thiamine deficiency and can lead to deficiencies of magnesium, zinc, and other vitamins.  Eating disorders, such as anorexia nervosa. People with these disorders may eat too little or too much.  Chewing or swallowing problems. People with these disorders may not eat enough.  Certain diseases, including:  Long-lasting (chronic) diseases. Chronic diseases tend to affect the absorption of calcium, iron, and vitamins B12, A, D, E, and K.  Liver disease. Liver disease affects the storage of vitamins A and B12. It also interferes with the metabolism of protein and energy  sources.  Kidney disease. Kidney disease may cause deficiencies of protein, iron, and vitamin D.  Cancer or AIDS. These diseases can cause a loss of appetite.  Cystic fibrosis. This disease can make it difficult for the body to absorb nutrients.  Certain diets, including.  The vegetarian diet. Vegetarians are at risk for iron deficiency.  The vegan diet. Vegans are susceptible to vitamin B12, calcium, iron, vitamin D, and zinc deficiencies.  The fruitarian diet. This diet can be deficient in protein, sodium, and many micronutrients.  Many commercial "fad" diets, including those that claim to enhance well-being and reduce weight.  Very low calorie diets.  Low income. People with a low income may have trouble paying for nutritious foods. What are the signs or symptoms? Signs and symptoms depend on the kind of malnutrition you have. Common symptoms include:  Fatigue.  Weakness.  Dizziness.  Fainting  Weight loss.  Poor immune response.  Lack of menstruation.  Hair loss.  Poor memory. How is this diagnosed? Malnutrition may be diagnosed by:  A medical history.  A dietary history.  A physical exam. This may include a measurement of your body mass index (BMI).  Blood tests. How is this treated? Treatments vary depending on the cause of the malnutrition. Common treatments include:  Dietary changes.  Dietary supplements, such as vitamins and minerals.  Treatment of any underlying conditions. Follow these instructions at home:  Eat a balanced diet.  Take dietary supplements as directed by your health care provider.  Exercise regularly. Exercising can improve appetite.  Keep all follow-up visits as directed by your health care provider. This is important. How is this prevented? Eating a well-balanced diet helps to prevent most forms of malnutrition. Contact a  health care provider if:  You have increased weakness or fatigue.  You faint.  You stop  menstruating.  You have rapid hair loss.  You have unexpected weight loss. This information is not intended to replace advice given to you by your health care provider. Make sure you discuss any questions you have with your health care provider. Document Released: 06/14/2005 Document Revised: 01/04/2016 Document Reviewed: 03/25/2014  2017 Elsevier

## 2016-08-20 ENCOUNTER — Telehealth: Payer: Self-pay

## 2016-08-20 LAB — IRON AND TIBC
Iron Saturation: 23 % (ref 15–55)
Iron: 74 ug/dL (ref 27–159)
TIBC: 323 ug/dL (ref 250–450)
UIBC: 249 ug/dL (ref 131–425)

## 2016-08-20 LAB — FERRITIN: FERRITIN: 76 ng/mL (ref 15–150)

## 2016-08-20 NOTE — Telephone Encounter (Signed)
I called pt. I advised her that her iron metabolism is normal. Pt verbalized understanding of results. Pt had no questions at this time but was encouraged to call back if questions arise.

## 2016-08-20 NOTE — Telephone Encounter (Signed)
-----   Message from Larey Seat, MD sent at 08/20/2016  9:23 AM EST ----- Normal iron metabolism, actually very good results.

## 2016-10-09 ENCOUNTER — Encounter: Payer: Self-pay | Admitting: Genetic Counselor

## 2016-10-09 ENCOUNTER — Other Ambulatory Visit: Payer: BLUE CROSS/BLUE SHIELD

## 2016-10-09 ENCOUNTER — Ambulatory Visit (HOSPITAL_BASED_OUTPATIENT_CLINIC_OR_DEPARTMENT_OTHER): Payer: BLUE CROSS/BLUE SHIELD | Admitting: Genetic Counselor

## 2016-10-09 DIAGNOSIS — Z315 Encounter for genetic counseling: Secondary | ICD-10-CM | POA: Diagnosis not present

## 2016-10-09 DIAGNOSIS — Z803 Family history of malignant neoplasm of breast: Secondary | ICD-10-CM | POA: Diagnosis not present

## 2016-10-09 NOTE — Progress Notes (Signed)
REFERRING PROVIDER: Lucretia Kern, DO [place], [place] [id number]   PRIMARY PROVIDER:  Lucretia Kern., DO  PRIMARY REASON FOR VISIT:  1. Family history of breast cancer      HISTORY OF PRESENT ILLNESS:   Ms. Nemetz, a 46 y.o. female, was seen for a [facility] cancer genetics consultation at the request of Dr. Maudie Mercury due to a family history of cancer.  Ms. Stelmach presents to clinic today to discuss the possibility of a hereditary predisposition to cancer, genetic testing, and to further clarify her future cancer risks, as well as potential cancer risks for family members. Ms. Jurewicz has never had a diagnosis of cancer.  She has asked her mother to undergo genetic testing and her mother had refused.  Ms. Gatt is here to learn about her risks for hereditary breast cancer syndromes.  CANCER HISTORY:   No history exists.     HORMONAL RISK FACTORS:  Menarche was at age 32.  First live birth at age 62.  OCP use for approximately 5-6 years.  Ovaries intact: yes.  Hysterectomy: no.  Menopausal status: premenopausal.  HRT use: 0 years. Colonoscopy: no; not examined. Mammogram within the last year: yes. Number of breast biopsies: 0. Up to date with pelvic exams:  yes. Any excessive radiation exposure in the past:  no  Past Medical History:  Diagnosis Date  . Anxiety and depression   . Complication of anesthesia    has been hard to wake up post-op  . Dental crowns present   . Diabetes ([place])    resolved with gastric bypass  . DVT of lower extremity (deep venous thrombosis) ([place]) 2012   LLE  . Dysmenorrhea    hx endometriosis and fibroid  . Family history of breast cancer   . Headache(784.0)    tension or sinus  . History of MRSA infection prior to 2012   lasted 5-6 yr.  . Jaw snapping    states jaw pops  . Left foot drop 2016   --[facility]  . Neuropathy ([place]) 2015   hands, feet and face--[facility]  . Right knee pain    hx meniscal injury,  chondromalacia, OA    Past Surgical History:  Procedure Laterality Date  . CHOLECYSTECTOMY  1990's  . DILITATION & CURRETTAGE/HYSTROSCOPY WITH NOVASURE ABLATION N/A 05/30/2014   Procedure: DILATATION & CURETTAGE/HYSTEROSCOPY WITH attempted [place] and with IUD removal ;  Surgeon: Jamey Reas de Berton Lan, MD;  Location: [facility];  Service: Gynecology;  Laterality: N/A;  . GASTRIC ROUX-EN-Y N/A 11/02/2013   Procedure: LAPAROSCOPIC ROUX-EN-Y GASTRIC BYPASS WITH UPPER ENDOSCOPY;  Surgeon: Gayland Curry, MD;  Location: [facility];  Service: General;  Laterality: N/A;  . HAND SURGERY Left    X 3 - spider bite and MRSA infection  . HYSTEROSCOPY WITH RESECTOSCOPE N/A 05/30/2014   Procedure: HYSTEROSCOPY WITH RESECTOSCOPE;  Surgeon: Jamey Reas de Berton Lan, MD;  Location: [facility];  Service: Gynecology;  Laterality: N/A;  . KNEE ARTHROSCOPY Right 12/29/2012   Procedure: RIGHT KNEE ARTHROSCOPY  PARTIAL MEDIAL AND LATERAL MENISECTOMY WITH CHONDROPLASTY;  Surgeon: Hessie Dibble, MD;  Location: [facility];  Service: Orthopedics;  Laterality: Right;  . PELVIC LAPAROSCOPY  1990's   d/t endometriosis  . TONSILLECTOMY  as child  . WRIST SURGERY Right 2005    Social History   Social History  . Marital status: Single    Spouse name: N/A  .  Number of children: N/A  . Years of education: N/A   Social History Main Topics  . Smoking status: Never Smoker  . Smokeless tobacco: Never Used  . Alcohol use 1.2 - 2.4 oz/week    2 - 4 Glasses of wine per week     Comment: 2-4 glasses of wine a few times per month  . Drug use: No  . Sexual activity: Yes    Partners: Female   Other Topics Concern  . 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  Significant diagnoses are listed below: Family History  Problem Relation Age of Onset  . Diabetes Mother     Living  . Kidney disease Mother   . Hypertension Mother   . Breast cancer Mother 30    dx'd with breast ca x2; 2nd around 37  . Diabetes Maternal Grandmother 36    dec  . Breast cancer Maternal Grandmother     dx in her 15s  . Arthritis Father   . Healthy Sister   . Healthy Sister   . Diabetes Maternal Grandfather   . Stroke Maternal Grandfather   . Healthy Daughter   . Breast cancer Other     2 great aunts  . Thyroid disease Neg Hx     The patient has one daughter who is cancer free.  She has two sisters who do not have cancer.  Her parents are both alive. Her mother was diagnosed at age 34 and again around 24 with breast cancer.  Her mother had a brother and sister.  The sister was born with multiple birth defects and died at 58 from their complications.  The brother is also deceased, no cancer.  The patient's maternal grandmother had breast cancer in her 28's.  She reports that two great aunts had breast cancer but she is unsure if they are from her grandmother or grandfather's side of the family (several of her grandmother's siblings married several of her grandfather's siblings).  The patient's father was an only child.  His mother died in childbirth and he was raised by his maternal grandparents.  He never knew his father.  Ms. Kinnick is unaware of previous family history of genetic testing for hereditary cancer risks. Patient's maternal ancestors are of [place] and [place] descent, and paternal ancestors are of [place] descent. There is no reported Ashkenazi Jewish ancestry. There is no known consanguinity.  GENETIC COUNSELING ASSESSMENT: Trishia Cuthrell is a 46 y.o. female with a family history of breast cancer which is somewhat suggestive of a hereditary cancer syndromes  and predisposition to cancer. We, therefore, discussed and recommended the following at today's visit.   DISCUSSION: We discussed that about 5-10% of breast cancer is hereditary with most cases due to BRCA mutations.  Other hereditary breast cancer genes that are implicated in hereditary breast cancer include ATM, CHEK2 and PALB2.  We reviewed the characteristics, features and inheritance patterns of hereditary cancer syndromes. We also discussed genetic testing, including the appropriate family members to test,  the process of testing, insurance coverage and turn-around-time for results. We discussed the implications of a negative, positive and/or variant of uncertain significant result. We recommended Ms. Alvis pursue genetic testing for the Common Hereditary cancer gene panel. The Hereditary Gene Panel offered by [facility] includes sequencing and/or deletion duplication testing of the following 43 genes: APC, ATM, AXIN2, BARD1, BMPR1A, BRCA1, BRCA2, BRIP1, CDH1, CDKN2A (p14ARF), CDKN2A (p16INK4a), CHEK2, DICER1, EPCAM (Deletion/duplication testing only), GREM1 (promoter region deletion/duplication testing only), KIT, MEN1, MLH1, MSH2, MSH6, MUTYH, NBN, NF1, PALB2, PDGFRA, PMS2, POLD1, POLE, PTEN, RAD50, RAD51C, RAD51D, SDHB, SDHC, SDHD, SMAD4, SMARCA4. STK11, TP53, TSC1, TSC2, and VHL.  The following gene was evaluated for sequence changes only: SDHA and HOXB13 c.251G>A variant only.  Based on Ms. Scheib's family history of cancer, she meets medical criteria for genetic testing. Despite that she meets criteria, she may still have an out of pocket cost. We discussed that if her out of pocket cost for testing is over $100, the laboratory will call and confirm whether she wants to proceed with testing.  If the out of pocket cost of testing is less than $100 she will be billed by the genetic testing laboratory.   PLAN: After considering the risks, benefits, and limitations, Ms. Trentham  provided informed consent  to pursue genetic testing and the blood sample was sent to [facility] for analysis of the Common Hereditary Cancer Panel. Results should be available within approximately 2-3 weeks' time, at which point they will be disclosed by telephone to Ms. Lasseter, as will any additional recommendations warranted by these results. Ms. Netherland will receive a summary of her genetic counseling visit and a copy of her results once available. This information will also be available in Epic. We encouraged Ms. Mullinix to remain in contact with cancer genetics annually so that we can continuously update the family history and inform her of any changes in cancer genetics and testing that may be of benefit for her family. Ms. Kemnitz questions were answered to her satisfaction today. Our contact information was provided should additional questions or concerns arise.  Based on Ms. Aschenbrenner's family history, we recommended her mother, who was diagnosed with breast cancer at age 44, have genetic counseling and testing. Ms. Brissett will let us know if we can be of any assistance in coordinating genetic counseling and/or testing for this family member.   Lastly, we encouraged Ms. Pattillo to remain in contact with cancer genetics annually so that we can continuously update the family history and inform her of any changes in cancer genetics and testing that may be of benefit for this family.   Ms.  Jagger questions were answered to her satisfaction today. Our contact information was provided should additional questions or concerns arise. Thank you for the referral and allowing [place] to share in the care of your patient.   Karen P. Florene Glen, [place], [facility] Certified Genetic Counselor [email or web address] phone: [phone]  The patient was seen for a total of 35 minutes in face-to-face genetic counseling.  This patient was discussed with Drs. Magrinat, Lindi Adie and/or Burr Medico who agrees with the above.     _______________________________________________________________________ For Office Staff:  Number of people involved in session: 1 Was an Intern/ student involved with case: no

## 2016-10-14 ENCOUNTER — Other Ambulatory Visit: Payer: Self-pay | Admitting: General Surgery

## 2016-10-16 ENCOUNTER — Encounter (HOSPITAL_COMMUNITY): Payer: BLUE CROSS/BLUE SHIELD

## 2016-10-18 ENCOUNTER — Ambulatory Visit: Payer: Self-pay | Admitting: Genetic Counselor

## 2016-10-18 ENCOUNTER — Telehealth: Payer: Self-pay | Admitting: Genetic Counselor

## 2016-10-18 ENCOUNTER — Ambulatory Visit (HOSPITAL_COMMUNITY)
Admission: RE | Admit: 2016-10-18 | Discharge: 2016-10-18 | Disposition: A | Discharge status: Home / Self Care | Payer: BLUE CROSS/BLUE SHIELD | Source: Ambulatory Visit | Attending: General Surgery | Admitting: General Surgery

## 2016-10-18 DIAGNOSIS — R202 Paresthesia of skin: Secondary | ICD-10-CM | POA: Insufficient documentation

## 2016-10-18 DIAGNOSIS — R200 Anesthesia of skin: Secondary | ICD-10-CM | POA: Diagnosis present

## 2016-10-18 DIAGNOSIS — Z803 Family history of malignant neoplasm of breast: Secondary | ICD-10-CM

## 2016-10-18 DIAGNOSIS — Z1379 Encounter for other screening for genetic and chromosomal anomalies: Secondary | ICD-10-CM

## 2016-10-18 MED ORDER — THIAMINE HCL 100 MG/ML IJ SOLN
INTRAVENOUS | Status: DC
  Administered 2016-10-18: 09:00:00 via INTRAVENOUS
  Filled 2016-10-18 (×4): qty 1000

## 2016-10-18 MED ORDER — ONDANSETRON HCL 4 MG/2ML IJ SOLN: 4.0000 mg | INTRAMUSCULAR | Status: DC | PRN

## 2016-10-18 MED ORDER — DIPHENHYDRAMINE HCL 25 MG PO CAPS: 25.0000 mg | ORAL_CAPSULE | ORAL | Status: DC | PRN

## 2016-10-18 MED ORDER — DIPHENHYDRAMINE HCL 50 MG/ML IJ SOLN: 25.0000 mg | INTRAMUSCULAR | Status: DC | PRN

## 2016-10-18 MED ORDER — ONDANSETRON 4 MG PO TBDP
4.0000 mg | ORAL_TABLET | ORAL | Status: DC | PRN
  Filled 2016-10-18: qty 1

## 2016-10-18 NOTE — Discharge Instructions (Signed)
Today, you received an IV infusion of Normal Saline with added thiamine, multivitamins, and folic acid.  Please follow up with Dr. Redmond Pulling for further instructions.

## 2016-10-18 NOTE — Progress Notes (Signed)
HPI: Ms. Icard was previously seen in the [facility] clinic due to a family history of cancer and concerns regarding a hereditary predisposition to cancer. Please refer to our prior cancer genetics clinic note for more information regarding Ms. Pooley's medical, social and family histories, and our assessment and recommendations, at the time. Ms. Kitch recent genetic test results were disclosed to her, as were recommendations warranted by these results. These results and recommendations are discussed in more detail below.  CANCER HISTORY:   No history exists.    FAMILY HISTORY:  We obtained a detailed, 4-generation family history.  Significant diagnoses are listed below: Family History  Problem Relation Age of Onset  . Diabetes Mother     Living  . Kidney disease Mother   . Hypertension Mother   . Breast cancer Mother 65    dx'd with breast ca x2; 2nd around 7  . Diabetes Maternal Grandmother 3    dec  . Breast cancer Maternal Grandmother     dx in her 66s  . Arthritis Father   . Healthy Sister   . Healthy Sister   . Diabetes Maternal Grandfather   . Stroke Maternal Grandfather   . Healthy Daughter   . Breast cancer Other     2 great aunts  . Thyroid disease Neg Hx     The patient has one daughter who is cancer free.  She has two sisters who do not have cancer.  Her parents are both alive. Her mother was diagnosed at age 46 and again around 3 with breast cancer.  Her mother had a brother and sister.  The sister was born with multiple birth defects and died at 9 from their complications.  The brother is also deceased, no cancer.  The patient's maternal grandmother had breast cancer in her 40's.  She reports that two great aunts had breast cancer but she is unsure if they are from her grandmother or grandfather's side of the family (several of her grandmother's siblings married several of her grandfather's siblings).  The patient's father was an only child.   His mother died in childbirth and he was raised by his maternal grandparents.  He never knew his father.  Ms. Suen is unaware of previous family history of genetic testing for hereditary cancer risks. Patient's maternal ancestors are of [place], [place] and [place] descent, and paternal ancestors are of [place] descent. There is no reported Ashkenazi Jewish ancestry. There is no known consanguinity.  GENETIC TEST RESULTS: Genetic testing reported out on March 8, 204[address] through the Common Hereditary cancer panel found no deleterious mutations.  The Hereditary Gene Panel offered by [facility] includes sequencing and/or deletion duplication testing of the following 43 genes: APC, ATM, AXIN2, BARD1, BMPR1A, BRCA1, BRCA2, BRIP1, CDH1, CDKN2A (p14ARF), CDKN2A (p16INK4a), CHEK2, DICER1, EPCAM (Deletion/duplication testing only), GREM1 (promoter region deletion/duplication testing only), KIT, MEN1, MLH1, MSH2, MSH6, MUTYH, NBN, NF1, PALB2, PDGFRA, PMS2, POLD1, POLE, PTEN, RAD50, RAD51C, RAD51D, SDHB, SDHC, SDHD, SMAD4, SMARCA4. STK11, TP53, TSC1, TSC2, and VHL.  The following gene was evaluated for sequence changes only: SDHA and HOXB13 c.251G>A variant only.  The test report has been scanned into EPIC and is located under the Molecular Pathology section of the Results Review tab.   We discussed with Ms. Prine that since the current genetic testing is not perfect, it is possible there may be a gene mutation in one of these genes that current testing cannot detect, but that chance is small. We also  discussed, that it is possible that another gene that has not yet been discovered, or that we have not yet tested, is responsible for the cancer diagnoses in the family, and it is, therefore, important to remain in touch with cancer genetics in the future so that we can continue to offer Ms. Colebank the most up to date genetic testing.   Genetic testing did detect a Variant of Unknown Significance in the ATM gene called  c.4868T>C.  At this time, it is unknown if this variant is associated with increased cancer risk or if this is a normal finding, but most variants such as this get reclassified to being inconsequential. It should not be used to make medical management decisions. With time, we suspect the lab will determine the significance of this variant, if any. If we do learn more about it, we will try to contact Ms. Mckown to discuss it further. However, it is important to stay in touch with [place] periodically and keep the address and phone number up to date.   CANCER SCREENING RECOMMENDATIONS:  Given Ms. Balingit's personal and family histories, we must interpret these negative results with some caution.  Families with features suggestive of hereditary risk for cancer tend to have multiple family members with cancer, diagnoses in multiple generations and diagnoses before the age of 19. Ms. Brennan family exhibits some of these features. Thus this result may simply reflect our current inability to detect all mutations within these genes or there may be a different gene that has not yet been discovered or tested.   Based on Ms. Plaza's personal and family history of cancer, as well as her genetic test results, statistical models (Tyrer Cusik)  and literature data were used to estimate her risk of developing breast cancer. These estimate her lifetime risk of developing breast cancer to be approximately 22.1%.  The patient's lifetime breast cancer risk is a preliminary estimate based on available information using one of several models endorsed by the [place] (ACS). The ACS recommends consideration of breast MRI screening as an adjunct to mammography for patients at high risk (defined as 20% or greater lifetime risk). A more detailed breast cancer risk assessment can be considered, if clinically indicated.   Ms. Dolle has been determined to be at high risk for breast cancer.  Therefore, we recommend that annual  screening with mammography and breast MRI begin at age 47, or 10 years prior to the age of breast cancer diagnosis in a relative (whichever is earlier).  We discussed that Ms. Payeur should discuss her individual situation with her referring physician and determine a breast cancer screening plan with which they are both comfortable.    RECOMMENDATIONS FOR FAMILY MEMBERS: Women in this family might be at some increased risk of developing cancer, over the general population risk, simply due to the family history of cancer. We recommended women in this family have a yearly mammogram beginning at age 39, or 31 years younger than the earliest onset of cancer, an annual clinical breast exam, and perform monthly breast self-exams. Women in this family should also have a gynecological exam as recommended by their primary provider. All family members should have a colonoscopy by age 37.  Based on Ms. Kelty's family history, we recommended her mother, who was diagnosed with breast cancer at age 93, have genetic counseling and testing.  It is our understanding that this individual has refused to undergo genetic testing. Should she decide that she wants testing, Ms. Bettes  will let us know if we can be of any assistance in coordinating genetic counseling and/or testing for this family member.   FOLLOW-UP: Lastly, we discussed with Ms. Ruud that cancer genetics is a rapidly advancing field and it is possible that new genetic tests will be appropriate for her and/or her family members in the future. We encouraged her to remain in contact with cancer genetics on an annual basis so we can update her personal and family histories and let her know of advances in cancer genetics that may benefit this family.   Our contact number was provided. Ms. Mcfayden questions were answered to her satisfaction, and she knows she is welcome to call us at anytime with additional questions or concerns.   Roma Kayser, MS,  [facility] Certified Genetic Counselor [email or web address]

## 2016-10-18 NOTE — Progress Notes (Signed)
Provider: Redmond Pulling, E   Procedure: Sodium Chloride 0.9% 1,000 ml thiamine 795 mg folic acid 1 mg multivitamins infusion  Diagnosis: Numbness and tingling of both lower extremities   Treatment: Patient received Sodium Chloride 0.9% 1,000 ml thiamine 583 mg folic acid 1 mg multivitamins infusion. Patient tolerated procedure well. Patient alert oriented, and ambulatory at time of discharge. Discharge instructions given to patient and patient states an understanding.

## 2016-10-18 NOTE — Telephone Encounter (Signed)
Revealed negative genetic testing.  Discussed that we do not know why there is cancer in the family. It could be due to a different gene that we are not testing, or maybe our current technology may not be able to pick something up. Discussed that an ATM VUS was identified.  Once this is reclassified we will be in contact with her. It will be important for her to keep in contact with genetics to keep up with whether additional testing may be needed.  Based on family history, patient is at 22.1% lifetime risk for breast cancer.  ACS recommends MRI if risk over 20%.

## 2016-10-30 DIAGNOSIS — E119 Type 2 diabetes mellitus without complications: Secondary | ICD-10-CM | POA: Diagnosis not present

## 2016-10-30 DIAGNOSIS — H5213 Myopia, bilateral: Secondary | ICD-10-CM | POA: Diagnosis not present

## 2016-10-30 DIAGNOSIS — H524 Presbyopia: Secondary | ICD-10-CM | POA: Diagnosis not present

## 2016-10-30 LAB — HM DIABETES EYE EXAM

## 2016-11-01 ENCOUNTER — Encounter: Payer: Self-pay | Admitting: Family Medicine

## 2016-11-06 ENCOUNTER — Ambulatory Visit (INDEPENDENT_AMBULATORY_CARE_PROVIDER_SITE_OTHER): Payer: BLUE CROSS/BLUE SHIELD | Admitting: Obstetrics and Gynecology

## 2016-11-06 ENCOUNTER — Encounter: Payer: Self-pay | Admitting: Obstetrics and Gynecology

## 2016-11-06 VITALS — BP 122/66 | HR 76 | Ht 67.5 in | Wt 214.6 lb

## 2016-11-06 DIAGNOSIS — N926 Irregular menstruation, unspecified: Secondary | ICD-10-CM | POA: Diagnosis not present

## 2016-11-06 MED ORDER — NORETHINDRONE 0.35 MG PO TABS: 1.0000 | ORAL_TABLET | Freq: Every day | ORAL | 0 refills | Status: DC

## 2016-11-06 NOTE — Progress Notes (Signed)
GYNECOLOGY  VISIT   HPI: 46 y.o.   Single  Caucasian  female   [id number] with Patient's last menstrual period was 10/25/2016 (exact date).   here for follow up after beginning [place].  Patient still had heavy cycle.   Hx endometrial ablation.  Has menses 16 days apart which lead to the evaluation.  Possible adenomyosis, degenerating fibroid, or endometrial mass on ultrasound/SIS exam on 08/08/17.  EMB was benign endometrium.  Started Micronor at end of January just to start although there was not bleeding at that time. In the second week of the second pack, she bled for one week.  Other that she had not had any bleeding. In her third pack she bled for 6 days and it was heavy. Bled through a tampon and was painful period. Feels like the Micronor is not really helping.  Hx DVT on OCPs.  22.1% risk of breast cancer in her lifetime.  Has genetic mutation - ATM VUS. Prefers not to have long term hormonal exposure.  GYNECOLOGIC HISTORY: Patient's last menstrual period was 10/25/2016 (exact date). Contraception:  Same sex partner--hx of ablation--Micronor Menopausal hormone therapy:  n/a Last mammogram: 08-06-16 Diag.Bil.& Rt.Br.U/S--Density B/Neg/BiRads2:TBC  Last pap smear:  04/29/14 - neg, neg HR HPV;04-23-13 Neg        OB History    Gravida Para Term Preterm AB Living   _0 SAB TAB Ectopic Multiple Live Births   2                 Patient Active Problem List   Diagnosis Date Noted  . Numbness and tingling of both lower extremities 10/18/2016  . Genetic testing 10/18/2016  . Family history of breast cancer   . Multinodular goiter 04/24/2015  . Mood disorder ([place]) 04/12/2015  . Peroneal mononeuropathy 08/22/2014  . Left foot drop 07/21/2014  . Menorrhagia with irregular cycle 04/29/2014  . S/P gastric bypass 11/02/2013  . Hyperlipidemia 10/08/2013  . Primary localized osteoarthrosis, lower leg 07/06/2013  . Obesity, BMI unknown 04/23/2013  . IUD contraception -  inserted summer 2013 04/09/2013    Past Medical History:  Diagnosis Date  . Anxiety and depression   . Complication of anesthesia    has been hard to wake up post-op  . Dental crowns present   . Diabetes ([place])    resolved with gastric bypass  . DVT of lower extremity (deep venous thrombosis) ([place]) 2012   LLE  . Dysmenorrhea    hx endometriosis and fibroid  . Family history of breast cancer   . Headache(784.0)    tension or sinus  . History of MRSA infection prior to 2012   lasted 5-6 yr.  . Jaw snapping    states jaw pops  . Left foot drop 2016   --[place] neurology  . Neuropathy ([place]) 2015   hands, feet and face--[place] neurolog[facility]  . Right knee pain    hx meniscal injury, chondromalacia, OA    Past Surgical History:  Procedure Laterality Date  . CHOLECYSTECTOMY  1990's  . DILITATION & CURRETTAGE/HYSTROSCOPY WITH NOVASURE ABLATION N/A 05/30/2014   Procedure: DILATATION & CURETTAGE/HYSTEROSCOPY WITH attempted [place] and with IUD removal ;  Surgeon: Jamey Reas de Berton Lan, MD;  Location: [facility];  Service: Gynecology;  Laterality: N/A;  . GASTRIC ROUX-EN-Y N/A 11/02/2013   Procedure: LAPAROSCOPIC ROUX-EN-Y GASTRIC BYPASS WITH UPPER ENDOSCOPY;  Surgeon: Gayland Curry, MD;  Location: [facility];  Service: General;  Laterality: N/A;  . HAND SURGERY Left    X 3 - spider bite and MRSA infection  . HYSTEROSCOPY WITH RESECTOSCOPE N/A 05/30/2014   Procedure: HYSTEROSCOPY WITH RESECTOSCOPE;  Surgeon: Jamey Reas de Berton Lan, MD;  Location: [facility];  Service: Gynecology;  Laterality: N/A;  . KNEE ARTHROSCOPY Right 12/29/2012   Procedure: RIGHT KNEE ARTHROSCOPY  PARTIAL MEDIAL AND LATERAL MENISECTOMY WITH CHONDROPLASTY;  Surgeon: Hessie Dibble, MD;  Location: [facility];  Service: Orthopedics;  Laterality: Right;  . PELVIC LAPAROSCOPY  1990's   d/t endometriosis  . TONSILLECTOMY  as child  . WRIST SURGERY Right 2005    Current  Outpatient Prescriptions  Medication Sig Dispense Refill  . Calcium-Vitamin D-Vitamin K (CHEWABLE CALCIUM PO) Take 1 tablet by mouth daily.    . DULoxetine (CYMBALTA) 60 MG capsule Take 120 mg by mouth every morning.     . Melatonin 1 MG SUBL Place 3 mg under the tongue at bedtime. 1 tablet 2  . Multiple Vitamin (MULTIVITAMIN) tablet Take 1 tablet by mouth daily.    . norethindrone (ORTHO MICRONOR) 0.35 MG tablet Take 1 tablet (0.35 mg total) by mouth daily. 4 Package 0  . pramipexole (MIRAPEX) 0.25 MG tablet Take 1 tablet (0.25 mg total) by mouth 3 (three) times daily. 30 tablet 5   No current facility-administered medications for this visit.      ALLERGIES: Other; Sulfa antibiotics; Bactrim [sulfamethoxazole-trimethoprim]; and Ibuprofen  Family History  Problem Relation Age of Onset  . Diabetes Mother     Living  . Kidney disease Mother   . Hypertension Mother   . Breast cancer Mother 10    dx'd with breast ca x2; 2nd around 42  . Diabetes Maternal Grandmother 90    dec  . Breast cancer Maternal Grandmother     dx in her 32s  . Arthritis Father   . Healthy Sister   . Healthy Sister   . Diabetes Maternal Grandfather   . Stroke Maternal Grandfather   . Healthy Daughter   . Breast cancer Other     2 great aunts  . Thyroid disease Neg Hx     Social History   Social History  . Marital status: Single    Spouse name: N/A  . Number of children: N/A  . Years of education: N/A   Occupational History  . Not on file.   Social History Main Topics  . Smoking status: Never Smoker  . Smokeless tobacco: Never Used  . Alcohol use 1.2 - 2.4 oz/week    2 - 4 Glasses of wine per week     Comment: 2-4 glasses of wine a few times per month  . Drug use: No  . Sexual activity: Yes    Partners: Female    Birth control/ protection: Pill     Comment: Micronor   Other Topics Concern  . 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  PHYSICAL EXAMINATION:    BP 122/66 (BP Location: Right  Arm, Patient Position: Sitting, Cuff Size: Large)   Pulse 76   Ht 5' 7.5" (1.715 m)   Wt 214 lb 9.6 oz (97.3 kg)   LMP 10/25/2016 (Exact Date)   BMI 33.12 kg/m     General appearance: alert, cooperative and appears stated age   ASSESSMENT  Hx of metrorrhagia.  Probable anovulatory bleeding.  Recent menorrhagia.  Status post endometrial ablation.  Increased lifetime risk of breast cancer. Status post gastric bypass.  PLAN  We discussed alternatives such as Depo Provera, cyclic progesterone if bleeding irregularity occurs again.  Her goal is to avoid hormonal exposures. Hysterectomy is an option as well. Follow up for annual exam and prn.   An After Visit Summary was printed and given to the patient.  ___15___ minutes face to face time of which over 50% was spent in counseling.

## 2016-11-12 ENCOUNTER — Encounter (HOSPITAL_COMMUNITY): Payer: Self-pay

## 2016-11-25 ENCOUNTER — Ambulatory Visit: Payer: BLUE CROSS/BLUE SHIELD | Admitting: Adult Health

## 2016-11-26 DIAGNOSIS — R202 Paresthesia of skin: Secondary | ICD-10-CM | POA: Diagnosis not present

## 2016-11-27 ENCOUNTER — Encounter: Payer: Self-pay | Admitting: Family Medicine

## 2016-11-27 LAB — CBC AND DIFFERENTIAL
HEMATOCRIT: 41 % (ref 36–46)
Hemoglobin: 13.4 g/dL (ref 12.0–16.0)
NEUTROS ABS: 5 /uL
Platelets: 377 10*3/uL (ref 150–399)
WBC: 8.3 10^3/mL

## 2016-11-27 LAB — HEPATIC FUNCTION PANEL
ALK PHOS: 64 U/L (ref 25–125)
ALT: 17 U/L (ref 7–35)
AST: 24 U/L (ref 13–35)
BILIRUBIN, TOTAL: 0.4 mg/dL

## 2016-11-27 LAB — BASIC METABOLIC PANEL
BUN: 15 mg/dL (ref 4–21)
Creatinine: 0.9 mg/dL (ref 0.5–1.1)
Glucose: 91 mg/dL
Potassium: 4.2 mmol/L (ref 3.4–5.3)
SODIUM: 138 mmol/L (ref 137–147)

## 2016-11-27 LAB — VITAMIN B12: Vitamin B-12: 293

## 2016-12-04 DIAGNOSIS — F410 Panic disorder [episodic paroxysmal anxiety] without agoraphobia: Secondary | ICD-10-CM | POA: Diagnosis not present

## 2016-12-04 DIAGNOSIS — F3342 Major depressive disorder, recurrent, in full remission: Secondary | ICD-10-CM | POA: Diagnosis not present

## 2016-12-09 ENCOUNTER — Encounter: Payer: Self-pay | Admitting: Endocrinology

## 2016-12-09 ENCOUNTER — Ambulatory Visit (INDEPENDENT_AMBULATORY_CARE_PROVIDER_SITE_OTHER): Payer: BLUE CROSS/BLUE SHIELD | Admitting: Endocrinology

## 2016-12-09 VITALS — BP 118/78 | HR 74 | Ht 68.0 in | Wt 210.0 lb

## 2016-12-09 DIAGNOSIS — E042 Nontoxic multinodular goiter: Secondary | ICD-10-CM | POA: Diagnosis not present

## 2016-12-09 DIAGNOSIS — R202 Paresthesia of skin: Secondary | ICD-10-CM | POA: Diagnosis not present

## 2016-12-09 LAB — TSH: TSH: 1 u[IU]/mL (ref 0.35–4.50)

## 2016-12-09 NOTE — Progress Notes (Signed)
Subjective:    Patient ID: [name], female    DOB: 07-26-1971, 46 y.o.   MRN: [id number]  HPI Pt returns for f/u of multinodular goiter (in 2016, was incidentally noted to have a goiter, on carotid US; bx then was beth cat 2; she has been euthyroid; f/u US in 2017 was unchanged).  she intermittently notices the goiter.  She denies sob.   Past Medical History:  Diagnosis Date  . Anxiety and depression   . Complication of anesthesia    has been hard to wake up post-op  . Dental crowns present   . Diabetes ([place])    resolved with gastric bypass  . DVT of lower extremity (deep venous thrombosis) ([place]) 2012   LLE  . Dysmenorrhea    hx endometriosis and fibroid  . Family history of breast cancer   . Headache(784.0)    tension or sinus  . History of MRSA infection prior to 2012   lasted 5-6 yr.  . Jaw snapping    states jaw pops  . Left foot drop 2016   --[place] neurology  . Neuropathy 2015   hands, feet and face--[place] [facility]  . Right knee pain    hx meniscal injury, chondromalacia, OA    Past Surgical History:  Procedure Laterality Date  . CHOLECYSTECTOMY  1990's  . DILITATION & CURRETTAGE/HYSTROSCOPY WITH NOVASURE ABLATION N/A 05/30/2014   Procedure: DILATATION & CURETTAGE/HYSTEROSCOPY WITH attempted [place] and with IUD removal ;  Surgeon: Jamey Reas de Berton Lan, MD;  Location: [facility];  Service: Gynecology;  Laterality: N/A;  . GASTRIC ROUX-EN-Y N/A 11/02/2013   Procedure: LAPAROSCOPIC ROUX-EN-Y GASTRIC BYPASS WITH UPPER ENDOSCOPY;  Surgeon: Gayland Curry, MD;  Location: [facility];  Service: General;  Laterality: N/A;  . HAND SURGERY Left    X 3 - spider bite and MRSA infection  . HYSTEROSCOPY WITH RESECTOSCOPE N/A 05/30/2014   Procedure: HYSTEROSCOPY WITH RESECTOSCOPE;  Surgeon: Jamey Reas de Berton Lan, MD;  Location: [facility];  Service: Gynecology;  Laterality: N/A;  . KNEE ARTHROSCOPY Right 12/29/2012   Procedure: RIGHT KNEE  ARTHROSCOPY  PARTIAL MEDIAL AND LATERAL MENISECTOMY WITH CHONDROPLASTY;  Surgeon: Hessie Dibble, MD;  Location: [facility];  Service: Orthopedics;  Laterality: Right;  . PELVIC LAPAROSCOPY  1990's   d/t endometriosis  . TONSILLECTOMY  as child  . WRIST SURGERY Right 2005    Social History   Social History  . Marital status: Single    Spouse name: N/A  . Number of children: N/A  . Years of education: N/A   Occupational History  . Not on file.   Social History Main Topics  . Smoking status: Never Smoker  . Smokeless tobacco: Never Used  . Alcohol use 1.2 - 2.4 oz/week    2 - 4 Glasses of wine per week     Comment: 2-4 glasses of wine a few times per month  . Drug use: No  . Sexual activity: Yes    Partners: Female    Birth control/ protection: Pill     Comment: Micronor   Other Topics Concern  . 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 Calcium-Vitamin D-Vitamin K (CHEWABLE CALCIUM PO) Take 1 tablet by mouth daily.    . DULoxetine (CYMBALTA) 60 MG capsule Take 120 mg by mouth every morning.     . Multiple Vitamin (MULTIVITAMIN) tablet Take 1 tablet by mouth daily.    . Melatonin 1 MG SUBL Place 3 mg under the tongue at bedtime. (Patient not taking: Reported on 12/09/2016) 1 tablet 2  . norethindrone (ORTHO MICRONOR) 0.35 MG tablet Take 1 tablet (0.35 mg total) by mouth daily. (Patient not taking: Reported on 12/09/2016) 1 Package 0  . pramipexole (MIRAPEX) 0.25 MG tablet Take 1 tablet (0.25 mg total) by mouth 3 (three) times daily. (Patient not taking: Reported on 12/09/2016) 30 tablet 5   No current  facility-administered medications on file prior to visit.     Allergies  Allergen Reactions  . Other Hives  . Sulfa Antibiotics Other (See Comments)    HALLUCINATIONS, FEVER, CHILLS  . Bactrim [Sulfamethoxazole-Trimethoprim] Other (See Comments)    Hallucinations, fever chills  . Ibuprofen Other (See Comments)    Had gastric bypass. Told not to take NSAIDs.    Family History  Problem Relation Age of Onset  . Diabetes Mother     Living  . Kidney disease Mother   . Hypertension Mother   . Breast cancer Mother 39    dx'd with breast ca x2; 2nd around 19  . Diabetes Maternal Grandmother 26    dec  . Breast cancer Maternal Grandmother     dx in her 64s  . Arthritis Father   . Healthy Sister   . Healthy Sister   . Diabetes Maternal Grandfather   . Stroke Maternal Grandfather   . Healthy Daughter   . Breast cancer Other     2 great aunts  . Thyroid disease Neg Hx     BP 118/78   Pulse 74   Ht 5\' 8"  (1.727 m)   Wt 210 lb (95.3 kg)   SpO2 96%   BMI 31.93 kg/m    Review of Systems Denies dysphagia.     Objective:   Physical Exam VITAL SIGNS:  See vs page.  GENERAL: no distress.  NECK: the right thyroid nodule is again palpable, and freely mobile.      Assessment & Plan:  Multinodular goiter: clinically stable.  Check TSH, as she is at risk for hyperthyroidism.  Patient Instructions  A thyroid blood test is requested for you today.  We'll let you know about the results. most of the time, a "lumpy thyroid" will eventually become overactive.  this is usually a slow process, happening over the span of many years.  Please come back for a follow-up appointment in 2 years.

## 2016-12-09 NOTE — Patient Instructions (Addendum)
A thyroid blood test is requested for you today.  We'll let you know about the results. most of the time, a "lumpy thyroid" will eventually become overactive.  this is usually a slow process, happening over the span of many years.  Please come back for a follow-up appointment in 2 years.

## 2016-12-23 DIAGNOSIS — R200 Anesthesia of skin: Secondary | ICD-10-CM | POA: Diagnosis not present

## 2016-12-23 DIAGNOSIS — R202 Paresthesia of skin: Secondary | ICD-10-CM | POA: Diagnosis not present

## 2016-12-27 DIAGNOSIS — E042 Nontoxic multinodular goiter: Secondary | ICD-10-CM | POA: Diagnosis not present

## 2016-12-27 DIAGNOSIS — M50223 Other cervical disc displacement at C6-C7 level: Secondary | ICD-10-CM | POA: Diagnosis not present

## 2016-12-27 DIAGNOSIS — R202 Paresthesia of skin: Secondary | ICD-10-CM | POA: Diagnosis not present

## 2017-01-14 ENCOUNTER — Ambulatory Visit: Payer: BLUE CROSS/BLUE SHIELD | Admitting: Adult Health

## 2017-01-20 DIAGNOSIS — R1012 Left upper quadrant pain: Secondary | ICD-10-CM | POA: Diagnosis not present

## 2017-01-20 DIAGNOSIS — R202 Paresthesia of skin: Secondary | ICD-10-CM | POA: Diagnosis not present

## 2017-01-20 DIAGNOSIS — Z9884 Bariatric surgery status: Secondary | ICD-10-CM | POA: Diagnosis not present

## 2017-01-20 DIAGNOSIS — E663 Overweight: Secondary | ICD-10-CM | POA: Diagnosis not present

## 2017-01-21 ENCOUNTER — Other Ambulatory Visit: Payer: Self-pay | Admitting: General Surgery

## 2017-01-21 DIAGNOSIS — R1012 Left upper quadrant pain: Secondary | ICD-10-CM

## 2017-01-22 ENCOUNTER — Ambulatory Visit
Admission: RE | Admit: 2017-01-22 | Discharge: 2017-01-22 | Disposition: A | Discharge status: Home / Self Care | Payer: BLUE CROSS/BLUE SHIELD | Source: Ambulatory Visit | Attending: General Surgery | Admitting: General Surgery

## 2017-01-22 DIAGNOSIS — R109 Unspecified abdominal pain: Secondary | ICD-10-CM | POA: Diagnosis not present

## 2017-01-22 DIAGNOSIS — R1012 Left upper quadrant pain: Secondary | ICD-10-CM

## 2017-01-22 MED ORDER — IOPAMIDOL (ISOVUE-300) INJECTION 61%
125.0000 mL | Freq: Once | INTRAVENOUS | Status: AC | PRN
  Administered 2017-01-22: 125 mL via INTRAVENOUS

## 2017-02-12 NOTE — Progress Notes (Signed)
[name] [address] [place], [place] [address] Phone: [phone] Subjective:    I'm seeing this patient by the request  of:    CC: Knee swelling  HEN:[id number]  [name] is a 46 y.o. female coming in with complaint of Swelling. Patellofemoral arthritis bilaterally. States that the pain is worse than left and notices swelling. States that decreasing range of motion. Patient states that unfortunately affecting daily activities. Patient is having worsening discomfort overall. Some increasing instability.     Past Medical History:  Diagnosis Date  . Anxiety and depression   . Complication of anesthesia    has been hard to wake up post-op  . Dental crowns present   . Diabetes ([place])    resolved with gastric bypass  . DVT of lower extremity (deep venous thrombosis) ([place]) 2012   LLE  . Dysmenorrhea    hx endometriosis and fibroid  . Family history of breast cancer   . Headache(784.0)    tension or sinus  . History of MRSA infection prior to 2012   lasted 5-6 yr.  . Jaw snapping    states jaw pops  . Left foot drop 2016   --[place] neurology  . Neuropathy 2015   hands, feet and face--[place] neurolog[facility]  . Right knee pain    hx meniscal injury, chondromalacia, OA   Past Surgical History:  Procedure Laterality Date  . CHOLECYSTECTOMY  1990's  . DILITATION & CURRETTAGE/HYSTROSCOPY WITH NOVASURE ABLATION N/A 05/30/2014   Procedure: DILATATION & CURETTAGE/HYSTEROSCOPY WITH attempted [place] and with IUD removal ;  Surgeon: Jamey Reas de Berton Lan, MD;  Location: [facility];  Service: Gynecology;  Laterality: N/A;  . GASTRIC ROUX-EN-Y N/A 11/02/2013   Procedure: LAPAROSCOPIC ROUX-EN-Y GASTRIC BYPASS WITH UPPER ENDOSCOPY;  Surgeon: Gayland Curry, MD;  [facility];  Service: General;  Laterality: N/A;  . HAND SURGERY Left    X 3 - spider bite and MRSA infection  . HYSTEROSCOPY WITH RESECTOSCOPE N/A 05/30/2014   Procedure: HYSTEROSCOPY WITH RESECTOSCOPE;  Surgeon: Jamey Reas de Berton Lan, MD;  Location: [facility];  Service: Gynecology;  Laterality: N/A;  . KNEE ARTHROSCOPY Right 12/29/2012   Procedure: RIGHT KNEE ARTHROSCOPY  PARTIAL MEDIAL AND LATERAL MENISECTOMY WITH CHONDROPLASTY;  Surgeon: Hessie Dibble, MD;  Location: [facility];  Service: Orthopedics;  Laterality: Right;  . PELVIC LAPAROSCOPY  1990's   d/t endometriosis  . TONSILLECTOMY  as child  . WRIST SURGERY Right 2005   Social History   Social History  . Marital status: Single    Spouse name: N/A  . Number of children: N/A  . Years of education: N/A   Social History Main Topics  . Smoking status: Never Smoker  . Smokeless tobacco: Never Used  . Alcohol use 1.2 - 2.4 oz/week    2 - 4 Glasses of wine per week     Comment: 2-4 glasses of wine a few times per month  . Drug use: No  . Sexual activity: Yes    Partners: Female    Birth control/ protection: Pill     Comment: Micronor   Other Topics Concern  . 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 Other Hives  . Sulfa Antibiotics Other (See Comments)    HALLUCINATIONS, FEVER, CHILLS  . Bactrim [Sulfamethoxazole-Trimethoprim] Other (See Comments)    Hallucinations, fever chills  . Ibuprofen Other (See Comments)    Had gastric bypass. Told not to take NSAIDs.   Family History  Problem Relation Age of Onset  . Diabetes Mother        Living  . Kidney disease Mother   . Hypertension Mother   . Breast cancer Mother 59       dx'd with breast ca x2; 2nd around 29  . Diabetes Maternal Grandmother 34       dec  . Breast cancer Maternal Grandmother        dx in her 34s  .  Arthritis Father   . Healthy Sister   . Healthy Sister   . Diabetes Maternal Grandfather   . Stroke Maternal Grandfather   . Healthy Daughter   . Breast cancer Other        2 great aunts  . Thyroid disease Neg Hx     Past medical history, social, surgical and family history all reviewed in electronic medical record.  No pertanent information unless stated regarding to the chief complaint.   Review of Systems:Review of systems updated and as accurate as of 02/13/17  No headache, visual changes, nausea, vomiting, diarrhea, constipation, dizziness, abdominal pain, skin rash, fevers, chills, night sweats, weight loss, swollen lymph nodes, body aches, joint swelling, muscle aches, chest pain, shortness of breath, mood changes.   Objective  Blood pressure 130/80, pulse 62, height 5\' 8"  (1.727 m), weight 224 lb (101.6 kg), SpO2 98 %. Systems examined below as of 02/13/17   General: No apparent distress alert and oriented x3 mood and affect normal, dressed appropriately.  HEENT: Pupils equal, extraocular movements intact  Respiratory: Patient's speak in full sentences and does not appear short of breath  Cardiovascular: No lower extremity edema, non tender, no erythema  Skin: Warm dry intact with no signs of infection or rash on extremities or on axial skeleton.  Abdomen: Soft nontender  Neuro: Cranial nerves II through XII are intact, neurovascularly intact in all extremities with 2+ DTRs and 2+ pulses.  Lymph: No lymphadenopathy of posterior or anterior cervical chain or axillae bilaterally.  Gait normal with good balance and coordination.  MSK:  Non tender with full range of motion and good stability and symmetric strength and tone of shoulders, elbows, wrist, hip, and ankles bilaterally.  Knee: Left valgus deformity noted. Large thigh to calf ratio. effusion Tender to palpation over medial and PF joint line.  ROM full in flexion and extension and lower leg rotation. instability with  valgus force.  painful patellar compression. Patellar glide with moderate crepitus. Patellar and quadriceps tendons unremarkable. Hamstring and quadriceps strength is normal. Contralateral knee shows pain over the medial and patellofemoral joint. Mild instability with valgus force. No swelling.   Procedure: Real-time Ultrasound Guided Injection of left knee Device: GE Logiq Q7 Ultrasound guided injection is preferred based studies that show increased duration, increased effect, greater accuracy, decreased procedural pain, increased response rate, and decreased cost with ultrasound guided versus blind injection.  Verbal informed consent obtained.  Time-out conducted.  Noted no overlying erythema, induration, or other signs of local infection.  Skin  prepped in a sterile fashion.  Local anesthesia: Topical Ethyl chloride.  With sterile technique and under real time ultrasound guidance: With a 22-gauge 2 inch needle patient was injected with 4 cc of 0.5% Marcaine and then aspirated 20 mL of strawlike fluid and injected 1 cc of Kenalog 40 mg/dL. This was from a superior lateral approach.  Completed without difficulty  Pain immediately resolved suggesting accurate placement of the medication.  Advised to call if fevers/chills, erythema, induration, drainage, or persistent bleeding.  Images permanently stored and available for review in the ultrasound unit.  Impression: Technically successful ultrasound guided injection.   After informed written and verbal consent, patient was seated on exam table. Right knee was prepped with alcohol swab and utilizing anterolateral approach, patient's right knee space was injected with 4:1  marcaine 0.5%: Kenalog 40mg /dL. Patient tolerated the procedure well without immediate complications.  Impression and Recommendations:     This case required medical decision making of moderate complexity.      Note: This dictation was prepared with Dragon dictation  along with smaller phrase technology. Any transcriptional errors that result from this process are unintentional.

## 2017-02-13 ENCOUNTER — Encounter: Payer: Self-pay | Admitting: Family Medicine

## 2017-02-13 ENCOUNTER — Ambulatory Visit: Payer: Self-pay

## 2017-02-13 ENCOUNTER — Ambulatory Visit (INDEPENDENT_AMBULATORY_CARE_PROVIDER_SITE_OTHER): Payer: BLUE CROSS/BLUE SHIELD | Admitting: Family Medicine

## 2017-02-13 VITALS — BP 130/80 | HR 62 | Ht 68.0 in | Wt 224.0 lb

## 2017-02-13 DIAGNOSIS — G8929 Other chronic pain: Secondary | ICD-10-CM | POA: Diagnosis not present

## 2017-02-13 DIAGNOSIS — M1711 Unilateral primary osteoarthritis, right knee: Secondary | ICD-10-CM | POA: Diagnosis not present

## 2017-02-13 DIAGNOSIS — M1712 Unilateral primary osteoarthritis, left knee: Secondary | ICD-10-CM

## 2017-02-13 DIAGNOSIS — M25562 Pain in left knee: Secondary | ICD-10-CM | POA: Diagnosis not present

## 2017-02-13 MED ORDER — VITAMIN D (ERGOCALCIFEROL) 1.25 MG (50000 UNIT) PO CAPS: 50000.0000 [IU] | ORAL_CAPSULE | ORAL | 0 refills | Status: DC

## 2017-02-13 NOTE — Assessment & Plan Note (Signed)
Patient was given an injection today and tolerated the procedure well. Patient did have aspiration done today with 20 mL of strawlike colored fluid. Discussed with patient about possible viscous supplementation. Continue conservative therapy including and once weekly vitamin D that were prescribed today. Patient has worsening pain we'll consider topical anti-inflammatories. Follow-up again in 4-6 weeks.

## 2017-02-13 NOTE — Assessment & Plan Note (Signed)
Right knee injected today. We did this not under ultrasound guidance at night and no effusion. Patient though did have an bilateral injections. We discussed icing regimen, home exercises, continuing with weight loss. Patient does have severe arthritis overall: Will need a knee replacement but is only at the age of 65. Encourage her to continue to monitor. Declined custom bracing. We'll continue once weekly vitamin D which was refilled today. Follow-up again in 4-6 weeks. Can be a candidate for Synvisc.

## 2017-02-13 NOTE — Patient Instructions (Addendum)
Good to see you  [name] is your friend.  Stay active.  Continue the vitamins Sent in once weekly vitamin D See me again in 4-6 weeks if not a lot better.

## 2017-02-25 DIAGNOSIS — R202 Paresthesia of skin: Secondary | ICD-10-CM | POA: Diagnosis not present

## 2017-02-25 DIAGNOSIS — R21 Rash and other nonspecific skin eruption: Secondary | ICD-10-CM | POA: Diagnosis not present

## 2017-02-25 DIAGNOSIS — R200 Anesthesia of skin: Secondary | ICD-10-CM | POA: Diagnosis not present

## 2017-03-21 ENCOUNTER — Ambulatory Visit (INDEPENDENT_AMBULATORY_CARE_PROVIDER_SITE_OTHER): Payer: BLUE CROSS/BLUE SHIELD | Admitting: Family Medicine

## 2017-03-21 ENCOUNTER — Encounter: Payer: Self-pay | Admitting: Family Medicine

## 2017-03-21 VITALS — BP 118/68 | HR 74 | Temp 98.0°F | Ht 68.0 in | Wt 227.7 lb

## 2017-03-21 DIAGNOSIS — R109 Unspecified abdominal pain: Secondary | ICD-10-CM

## 2017-03-21 LAB — COMPREHENSIVE METABOLIC PANEL
ALT: 14 U/L (ref 0–35)
AST: 17 U/L (ref 0–37)
Albumin: 4.3 g/dL (ref 3.5–5.2)
Alkaline Phosphatase: 80 U/L (ref 39–117)
BUN: 14 mg/dL (ref 6–23)
CALCIUM: 9.2 mg/dL (ref 8.4–10.5)
CHLORIDE: 101 meq/L (ref 96–112)
CO2: 33 meq/L — AB (ref 19–32)
Creatinine, Ser: 0.77 mg/dL (ref 0.40–1.20)
GFR: 85.55 mL/min (ref >60.00)
Glucose, Bld: 127 mg/dL — ABNORMAL HIGH (ref 70–99)
POTASSIUM: 4.5 meq/L (ref 3.5–5.1)
Sodium: 137 mEq/L (ref 135–145)
Total Bilirubin: 0.4 mg/dL (ref 0.2–1.2)
Total Protein: 6.8 g/dL (ref 6.0–8.3)

## 2017-03-21 LAB — CBC WITH DIFFERENTIAL/PLATELET
BASOS ABS: 0.1 10*3/uL (ref 0.0–0.1)
Basophils Relative: 1.3 % (ref 0.0–3.0)
EOS ABS: 0.2 10*3/uL (ref 0.0–0.7)
Eosinophils Relative: 2.3 % (ref 0.0–5.0)
HEMATOCRIT: 39.7 % (ref 36.0–46.0)
Hemoglobin: 13.2 g/dL (ref 12.0–15.0)
LYMPHS PCT: 26 % (ref 12.0–46.0)
Lymphs Abs: 2 10*3/uL (ref 0.7–4.0)
MCHC: 33.2 g/dL (ref 30.0–36.0)
MCV: 94.4 fl (ref 78.0–100.0)
Monocytes Absolute: 0.5 10*3/uL (ref 0.1–1.0)
Monocytes Relative: 7 % (ref 3.0–12.0)
Neutro Abs: 4.8 10*3/uL (ref 1.4–7.7)
Neutrophils Relative %: 63.4 % (ref 43.0–77.0)
PLATELETS: 372 10*3/uL (ref 150.0–400.0)
RBC: 4.2 Mil/uL (ref 3.87–5.11)
RDW: 13.3 % (ref 11.5–15.5)
WBC: 7.6 10*3/uL (ref 4.0–10.5)

## 2017-03-21 MED ORDER — DICYCLOMINE HCL 20 MG PO TABS: 20.0000 mg | ORAL_TABLET | Freq: Three times a day (TID) | ORAL | 0 refills | Status: DC | PRN

## 2017-03-21 NOTE — Progress Notes (Signed)
HPI:  Acute visit for left-sided abdominal pain: -Started about 6 months ago, worse for the last 2 months -Reports is constant, but intermittently worse and crampy -Involving the upper and mid left abdominal region -She saw her bariatric surgeon and did a CT scan about a month ago that was normal and also saw her neurologist about this issue -Continues to have symptoms, sometimes accompanied by loading and a bowel movement -Denies changes in bowels, fevers, malaise, unexplained weight loss, melena, hematochezia, dysuria, hematuria -She did just have her period which lasted for 10 days and finished yesterday  ROS: See pertinent positives and negatives per HPI.  Past Medical History:  Diagnosis Date  . Anxiety and depression   . Complication of anesthesia    has been hard to wake up post-op  . Dental crowns present   . Diabetes ([place])    resolved with gastric bypass  . DVT of lower extremity (deep venous thrombosis) ([place]) 2012   LLE  . Dysmenorrhea    hx endometriosis and fibroid  . Family history of breast cancer   . Headache(784.0)    tension or sinus  . History of MRSA infection prior to 2012   lasted 5-6 yr.  . Jaw snapping    states jaw pops  . Left foot drop 2016   --[place] neurology  . Neuropathy 2015   hands, feet and face--[place] [facility]  . Right knee pain    hx meniscal injury, chondromalacia, OA    Past Surgical History:  Procedure Laterality Date  . CHOLECYSTECTOMY  1990's  . DILITATION & CURRETTAGE/HYSTROSCOPY WITH NOVASURE ABLATION N/A 05/30/2014   Procedure: DILATATION & CURETTAGE/HYSTEROSCOPY WITH attempted [place] and with IUD removal ;  Surgeon: Jamey Reas de Berton Lan, MD;  Location: [facility];  Service: Gynecology;  Laterality: N/A;  . GASTRIC ROUX-EN-Y N/A 11/02/2013   Procedure: LAPAROSCOPIC ROUX-EN-Y GASTRIC BYPASS WITH UPPER ENDOSCOPY;  Surgeon: Gayland Curry, MD;  Location: [facility];  Service: General;  Laterality: N/A;  .  HAND SURGERY Left    X 3 - spider bite and MRSA infection  . HYSTEROSCOPY WITH RESECTOSCOPE N/A 05/30/2014   Procedure: HYSTEROSCOPY WITH RESECTOSCOPE;  Surgeon: Jamey Reas de Berton Lan, MD;  Location: [facility];  Service: Gynecology;  Laterality: N/A;  . KNEE ARTHROSCOPY Right 12/29/2012   Procedure: RIGHT KNEE ARTHROSCOPY  PARTIAL MEDIAL AND LATERAL MENISECTOMY WITH CHONDROPLASTY;  Surgeon: Hessie Dibble, MD;  Location: [facility];  Service: Orthopedics;  Laterality: Right;  . PELVIC LAPAROSCOPY  1990's   d/t endometriosis  . TONSILLECTOMY  as child  . WRIST SURGERY Right 2005    Family History  Problem Relation Age of Onset  . Diabetes Mother        Living  . Kidney disease Mother   . Hypertension Mother   . Breast cancer Mother 14       dx'd with breast ca x2; 2nd around 24  . Diabetes Maternal Grandmother 43       dec  . Breast cancer Maternal Grandmother        dx in her 12s  . Arthritis Father   . Healthy Sister   . Healthy Sister   . Diabetes Maternal Grandfather   . Stroke Maternal Grandfather   . Healthy Daughter   . Breast cancer Other        2 great aunts  . Thyroid disease Neg Hx     Social History   Social  History  . Marital status: Single    Spouse name: N/A  . Number of children: N/A  . Years of education: N/A   Social History Main Topics  . Smoking status: Never Smoker  . Smokeless tobacco: Never Used  . Alcohol use 1.2 - 2.4 oz/week    2 - 4 Glasses of wine per week     Comment: 2-4 glasses of wine a few times per month  . Drug use: No  . Sexual activity: Yes    Partners: Female    Birth control/ protection: Pill     Comment: Micronor   Other Topics Concern  . 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  Calcium-Vitamin D-Vitamin K (CHEWABLE CALCIUM PO), Take 1 tablet by mouth daily., Disp: , Rfl:  .  DULoxetine (CYMBALTA) 60 MG capsule, Take 120 mg by mouth every morning. , Disp: , Rfl:  .  LYRICA 25 MG capsule, Take 3 times daily, Disp: , Rfl:  .  Melatonin 1 MG SUBL, Place 3 mg under the tongue at bedtime., Disp: 1 tablet, Rfl: 2 .  Multiple Vitamin (MULTIVITAMIN) tablet, Take 1 tablet by mouth daily., Disp: , Rfl:  .  Vitamin D, Ergocalciferol, (DRISDOL) 50000 units CAPS capsule, Take 1 capsule (50,000 Units total) by mouth every 7 (seven) days., Disp: 12 capsule, Rfl: 0 .  dicyclomine (BENTYL) 20 MG tablet, Take 1 tablet (20 mg total) by mouth 3 (three) times daily as needed for spasms., Disp: 90 tablet, Rfl: 0  EXAM:  Vitals:   03/21/17 1145  BP: 118/68  Pulse: 74  Temp: 98 F (36.7 C)    Body mass index is 34.62 kg/m.  GENERAL: vitals reviewed and listed above, alert, oriented, appears well hydrated and in no acute distress  HEENT: atraumatic, conjunttiva clear, no obvious abnormalities on inspection of external nose and ears  NECK: no obvious masses on inspection  LUNGS: clear to auscultation bilaterally, no wheezes, rales or rhonchi, good air movement  CV: HRRR, no peripheral edema  ABD: Bowel sounds positive in all 4 quadrants, soft, mildly tender in the left upper and mid abdomen - without rebound or guarding  MS: moves all extremities without noticeable abnormality  PSYCH: pleasant and cooperative, no obvious depression or anxiety  ASSESSMENT AND PLAN:  Discussed the following assessment and plan:  Abdominal pain, L sided upper and mid - Plan: Ambulatory referral to Gastroenterology, CBC with Differential/Platelet, Comprehensive metabolic panel  -we discussed possible serious and likely etiologies, workup and treatment, treatment risks and return precautions; reviewed and discussed her  recent CT scan report with her -after this discussion, [name] opted for labs per orders, evaluation with gastroenterology, trial of Bentyl in the interim, consider further evaluation for kidney stones if symptoms persist in GI evaluation unrevealing -follow up advised 3 months -of course, we advised [name]  to return or notify a doctor immediately if symptoms worsen or persist or new concerns arise.   Patient Instructions  BEFORE YOU LEAVE: -follow up in 3 months -labs  -We placed a  referral for you as discussed to the gastroenterologist. It usually takes about 1-2 weeks to process and schedule this referral. If you have not heard from [place] regarding this appointment in 2 weeks please contact our office.  We have ordered labs or studies at this visit. It can take up to 1-2 weeks for results and processing. IF results require follow up or explanation, we will call you with instructions. Clinically stable results will be released to your [facility]. If you have not heard from [place] or cannot find your results in [facility] in 2 weeks please contact our office at [phone].  If you are not yet signed up for [facility], please consider signing up.  Can try the Bentyl in the interim to see if this helps.  I hope you are feeling better soon! Seek care immediately if worsening, new concerns or you are not improving with treatment.         Colin Benton R., DO

## 2017-03-21 NOTE — Patient Instructions (Signed)
BEFORE YOU LEAVE: -follow up in 3 months -labs  -We placed a referral for you as discussed to the gastroenterologist. It usually takes about 1-2 weeks to process and schedule this referral. If you have not heard from [place] regarding this appointment in 2 weeks please contact our office.  We have ordered labs or studies at this visit. It can take up to 1-2 weeks for results and processing. IF results require follow up or explanation, we will call you with instructions. Clinically stable results will be released to your [facility]. If you have not heard from [place] or cannot find your results in [facility] in 2 weeks please contact our office at [phone].  If you are not yet signed up for [facility], please consider signing up.  Can try the Bentyl in the interim to see if this helps.  I hope you are feeling better soon! Seek care immediately if worsening, new concerns or you are not improving with treatment.

## 2017-03-25 ENCOUNTER — Ambulatory Visit (INDEPENDENT_AMBULATORY_CARE_PROVIDER_SITE_OTHER): Payer: BLUE CROSS/BLUE SHIELD | Admitting: Internal Medicine

## 2017-03-25 ENCOUNTER — Encounter: Payer: Self-pay | Admitting: Internal Medicine

## 2017-03-25 VITALS — BP 132/78 | HR 68 | Ht 68.0 in | Wt 227.6 lb

## 2017-03-25 DIAGNOSIS — R194 Change in bowel habit: Secondary | ICD-10-CM | POA: Diagnosis not present

## 2017-03-25 DIAGNOSIS — R1012 Left upper quadrant pain: Secondary | ICD-10-CM

## 2017-03-25 DIAGNOSIS — K625 Hemorrhage of anus and rectum: Secondary | ICD-10-CM

## 2017-03-25 NOTE — Progress Notes (Signed)
[name] 46 y.o. [birth year] [id number]  Assessment & Plan:   Encounter Diagnoses  Name Primary?  . LUQ pain Yes  . Change in bowel habits   . Rectal bleeding     Schedule EGD and colonoscopy for Aug 30 The risks and benefits as well as alternatives of endoscopic procedure(s) have been discussed and reviewed. All questions answered. The patient agrees to proceed.    Encourage use of dicyclomine for cramping pain.  Patient should crush pill to take.  [name] has been having L sided abdominal pain for the past 6-7 months with some change in bowel habits and history of gastric bypass in 2015.  At this time we need to do an EGD and colonoscopy to help with diagnosis.  ? Inflamation vs Ulcers. ?Polyps vs stricture.  I have personally seen the patient, reviewed and repeated key elements of the history and physical and participated in formation of the assessment and plan the student has documented. Gatha Mayer, MD, [facility]  Cc:Kim, Nickola Major, DO Greer Pickerel, MD  Subjective:   Chief Complaint: L sided abdominal pain  HPI [name] is a pleasant 46 yo female new to the office here for evaluation of L sided abdominal pain.  Past medical history significant for neuropathy in all extremities, Roux en Y gastric bypass in 2015, dysmenorrhea with endometriosis as well as others listed below.     Today she reports a 7 month history of left sided abdominal pain that is cramping, sharp and tearing in nature.  It is intermittent and does not seem to be altered by foods or activity.  She says sometimes the pain feels better if she can lay down and stretch.  She admits to change in bowel shape and and increased frequency of bowel movements.  She denies, constipation or diarrhea.  Admits to possible hematochezia although unclear because she has been having dysmenorrhea and perimenopausal symptoms.  Denies fevers, chills night sweats. Denies nausea and vomiting.  Allergies  Allergen  Reactions  . Sulfa Antibiotics Other (See Comments)    HALLUCINATIONS, FEVER, CHILLS  . Bactrim [Sulfamethoxazole-Trimethoprim] Other (See Comments)    Hallucinations, fever chills  . Ibuprofen Other (See Comments)    Had gastric bypass. Told not to take NSAIDs.   Current Meds  Medication Sig  . Calcium-Vitamin D-Vitamin K (CHEWABLE CALCIUM PO) Take 1 tablet by mouth daily.  Marland Kitchen dicyclomine (BENTYL) 20 MG tablet Take 1 tablet (20 mg total) by mouth 3 (three) times daily as needed for spasms.  . DULoxetine (CYMBALTA) 60 MG capsule Take 120 mg by mouth every morning.   Marland Kitchen LYRICA 25 MG capsule Take 3 times daily  . Melatonin 1 MG SUBL Place 3 mg under the tongue at bedtime.  . Multiple Vitamin (MULTIVITAMIN) tablet Take 1 tablet by mouth daily.  . Vitamin D, Ergocalciferol, (DRISDOL) 50000 units CAPS capsule Take 1 capsule (50,000 Units total) by mouth every 7 (seven) days.   Past Medical History:  Diagnosis Date  . Anxiety and depression   . Complication of anesthesia    has been hard to wake up post-op  . Dental crowns present   . Diabetes ([place])    resolved with gastric bypass  . DVT of lower extremity (deep venous thrombosis) ([place]) 2012   LLE  . Dysmenorrhea    hx endometriosis and fibroid  . Family history of breast cancer   . Headache(784.0)    tension or sinus  . History of MRSA infection prior  to 2012   lasted 5-6 yr.  . Jaw snapping    states jaw pops  . Left foot drop 2016   --[place] neurolog[facility]  . Neuropathy 2015   hands, feet and face--[place] neurolog[facility]  . Right knee pain    hx meniscal injury, chondromalacia, OA   Past Surgical History:  Procedure Laterality Date  . CHOLECYSTECTOMY  1990's  . DILITATION & CURRETTAGE/HYSTROSCOPY WITH NOVASURE ABLATION N/A 05/30/2014   Procedure: DILATATION & CURETTAGE/HYSTEROSCOPY WITH attempted [place] and with IUD removal ;  Surgeon: Jamey Reas de Berton Lan, MD;  Location: [facility];  Service: Gynecology;   Laterality: N/A;  . GASTRIC ROUX-EN-Y N/A 11/02/2013   Procedure: LAPAROSCOPIC ROUX-EN-Y GASTRIC BYPASS WITH UPPER ENDOSCOPY;  Surgeon: Gayland Curry, MD;  Location: [facility];  Service: General;  Laterality: N/A;  . HAND SURGERY Left    X 3 - spider bite and MRSA infection  . HYSTEROSCOPY WITH RESECTOSCOPE N/A 05/30/2014   Procedure: HYSTEROSCOPY WITH RESECTOSCOPE;  Surgeon: Jamey Reas de Berton Lan, MD;  Location: [facility];  Service: Gynecology;  Laterality: N/A;  . KNEE ARTHROSCOPY Right 12/29/2012   Procedure: RIGHT KNEE ARTHROSCOPY  PARTIAL MEDIAL AND LATERAL MENISECTOMY WITH CHONDROPLASTY;  Surgeon: Hessie Dibble, MD;  Location: [facility];  Service: Orthopedics;  Laterality: Right;  . PELVIC LAPAROSCOPY  1990's   d/t endometriosis  . TONSILLECTOMY  as child  . WRIST SURGERY Right 2005   Social History   Social History  . Marital status: Single    Spouse name: N/A  . Number of children: N/A  . Years of education: N/A   Occupational History  . Not on file.   Social History Main Topics  . Smoking status: Never Smoker  . Smokeless tobacco: Never Used  . Alcohol use 1.2 - 2.4 oz/week    2 - 4 Glasses of wine per week     Comment: 2-4 glasses of wine a few times per month  . Drug use: No  . Sexual activity: Yes    Partners: Female    Birth control/ protection: Pill     Comment: Micronor   Other Topics Concern  . 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   Review of Systems GI: positive for abdominal pain, mild change in in bowel habits All other systems negative See HPI for details Objective:   Physical Exam @BP  132/78   Pulse 68   Ht 5\' 8"  (1.727 m)   Wt 227 lb 9.6 oz (103.2 kg)   LMP 03/20/2017 (Approximate)   BMI 34.61 kg/m @  General:  Well-developed, well-nourished and in no acute distress Eyes:  anicteric. ENT:   Mouth and  posterior pharynx free of lesions.  Neck:   supple w/o thyromegaly or mass.  Lungs: Clear to auscultation bilaterally. Heart:   S1S2, no rubs, murmurs, gallops. Abdomen:  soft, no hepatosplenomegaly, hernia, or mass and BS+. TTP LUQ and LLQ, no rebound of   guarding Rectal: Deferred until colonoscopy Lymph:  no cervical or supraclavicular adenopathy. Extremities:   no edema, cyanosis or clubbing Skin  Lower extremity rash- hyperpigmented excoriations dispersed on lower extremity bilaterally. Neuro:  A&O x 3.  Psych:  appropriate mood and  Affect.   Data Reviewed: CT 01/22/2017 Stomach/Bowel: Postsurgical changes are noted consistent with gastric bypass. The appendix is within normal limits. No obstructive or inflammatory changes are identified. IMPRESSION: Postsurgical changes are noted.  No acute abnormality seen.  CBC Latest Ref Rng & Units 03/21/2017 11/27/2016 05/19/2015  WBC 4.0 - 10.5 K/uL 7.6 8.3 7.8  Hemoglobin 12.0 - 15.0 g/dL 13.2 13.4 12.3  Hematocrit 36.0 - 46.0 % 39.7 41 37.2  Platelets 150.0 - 400.0 K/uL 372.0 [address] [place], PA-S [employer]

## 2017-03-25 NOTE — Patient Instructions (Signed)
  You have been scheduled for an endoscopy and colonoscopy. Please follow the written instructions given to you at your visit today. Please pick up your prep supplies at the pharmacy. If you use inhalers (even only as needed), please bring them with you on the day of your procedure. Your physician has requested that you go to www.startemmi.com and enter the access code given to you at your visit today. This web site gives a general overview about your procedure. However, you should still follow specific instructions given to you by our office regarding your preparation for the procedure.    I appreciate the opportunity to care for you. Carl Gessner, MD, FACG 

## 2017-03-27 ENCOUNTER — Encounter: Payer: Self-pay | Admitting: Internal Medicine

## 2017-03-31 ENCOUNTER — Telehealth: Payer: Self-pay | Admitting: Obstetrics and Gynecology

## 2017-03-31 NOTE — Telephone Encounter (Signed)
Patient states she has had her period for 13 of the last 20 days.  Says it has been heavy and changing every 2-3 hours.

## 2017-03-31 NOTE — Telephone Encounter (Signed)
Spoke with patient. Patient states she started her menses at the beginning of the month. Bled for 9 days. Changing her pad/tampon every 3 hours. Bleeding stopped for 4 days and then restarted. Currently changing her pad/tampon every 3 days with increased cramping. Reports feeling overly fatigued and weak. Denies SOB or dizziness. Not currently on any form of birth control. Has history of endometrial ablation and DVT with OCP use. Possible adenomyosis, degenerating fibroid, or endometrial mass on ultrasound/SIS exam on 08/08/17. Advised will review with Dr.Silva as she will need to be seen for evaluation and return call. Patient is agreeable.

## 2017-03-31 NOTE — Telephone Encounter (Signed)
I have an opened a schedule for tomorrow following surgery.  Please feel free to have patient come in tomorrow.

## 2017-03-31 NOTE — Telephone Encounter (Signed)
Spoke with patient. Appointment scheduled for 04/01/2017 at 11:30 am with Dr.Silva. Patient is agreeable to date and time.  Routing to provider for final review. Patient agreeable to disposition. Will close encounter.

## 2017-04-01 ENCOUNTER — Encounter: Payer: Self-pay | Admitting: Obstetrics and Gynecology

## 2017-04-01 ENCOUNTER — Ambulatory Visit (INDEPENDENT_AMBULATORY_CARE_PROVIDER_SITE_OTHER): Payer: BLUE CROSS/BLUE SHIELD | Admitting: Obstetrics and Gynecology

## 2017-04-01 VITALS — BP 130/80 | HR 70 | Resp 16 | Ht 68.0 in | Wt 227.0 lb

## 2017-04-01 DIAGNOSIS — N921 Excessive and frequent menstruation with irregular cycle: Secondary | ICD-10-CM

## 2017-04-01 MED ORDER — NORETHINDRONE ACETATE 5 MG PO TABS: 5.0000 mg | ORAL_TABLET | Freq: Every day | ORAL | 1 refills | Status: DC

## 2017-04-01 NOTE — Progress Notes (Signed)
GYNECOLOGY  VISIT   HPI: 46 y.o.   Married  Caucasian  female   [phone] with Patient's last menstrual period was 03/28/2017.   here for follow up heavy bleeding.    Hx endometrial ablation.  Has possible adenomyosis, degenerating fibroid or endometrial mass on US/SIS on 08/08/16. EMB benign.   Micronor did not help to reduce bleeding with her cycles.  Did a trial for 3 months.   Now menses are coming too often. LMP 7/19 and lasted 5 days. 8/1 - 8/9. 8/17 started bleeding again.   Called yesterday due to change of tampon every 2 - 3 hours.  Bleeding has decreased significantly overnight.   Tired but not dizzy.  Hgb 13.2 on 03/21/17.   Increased cramping.   Hx DVT on OCPs.  Had a work up in [place].   Prefers to avoid hormonal exposure due to [place] cancer of breast.  She has a mutation of ATM VUS. Increased risk of breast cancer at 22.1% so qualifies for yearly breast MRI.   Having an endoscopy and colonoscopy with Dr. Carlean Purl next week for LUQ pain since January. Cramping and soreness.  Not sure if she had blood in the stool with menses, but not repetitive.  Saw Dr. Redmond Pulling in March who did a CT scan and told patient her gastric bypass surgery does not demonstrate any problems.  This is what has lead her to see GI now.  GYNECOLOGIC HISTORY: Patient's last menstrual period was 03/28/2017. Contraception:  None - female partner  Menopausal hormone therapy:  none Last mammogram:  08/06/16 Diagnostic Bilateral BIRADS2:benign  Last pap smear:   04/29/14 Neg. HR HPV:neg         OB History    Gravida Para Term Preterm AB Living   '3 1 1   2 1   ' SAB TAB Ectopic Multiple Live Births   2                 Patient Active Problem List   Diagnosis Date Noted  . Degenerative arthritis of right knee 02/13/2017  . Degenerative arthritis of left knee 02/13/2017  . Numbness and tingling of both lower extremities 10/18/2016  . Genetic testing 10/18/2016  . Family history of breast cancer    . Multinodular goiter 04/24/2015  . Mood disorder ([place]) 04/12/2015  . Peroneal mononeuropathy 08/22/2014  . Left foot drop 07/21/2014  . Menorrhagia with irregular cycle 04/29/2014  . S/P gastric bypass 11/02/2013  . Hyperlipidemia 10/08/2013  . Primary localized osteoarthrosis, lower leg 07/06/2013  . Obesity, BMI unknown 04/23/2013  . IUD contraception - inserted summer 2013 04/09/2013    Past Medical History:  Diagnosis Date  . Anxiety and depression   . Complication of anesthesia    has been hard to wake up post-op  . Dental crowns present   . Diabetes ([place])    resolved with gastric bypass  . DVT of lower extremity (deep venous thrombosis) ([place]) 2012   LLE  . Dysmenorrhea    hx endometriosis and fibroid  . Family history of breast cancer   . Headache(784.0)    tension or sinus  . History of MRSA infection prior to 2012   lasted 5-6 yr.  . Jaw snapping    states jaw pops  . Left foot drop 2016   --[place] [facility]  . Neuropathy 2015   hands, feet and face--[place] [facility]  . Right knee pain    hx meniscal injury, chondromalacia, OA    Past Surgical  History:  Procedure Laterality Date  . CHOLECYSTECTOMY  1990's  . DILITATION & CURRETTAGE/HYSTROSCOPY WITH NOVASURE ABLATION N/A 05/30/2014   Procedure: DILATATION & CURETTAGE/HYSTEROSCOPY WITH attempted Coffee and with IUD removal ;  Surgeon: Jamey Reas de Berton Lan, MD;  Location: [facility];  Service: Gynecology;  Laterality: N/A;  . GASTRIC ROUX-EN-Y N/A 11/02/2013   Procedure: LAPAROSCOPIC ROUX-EN-Y GASTRIC BYPASS WITH UPPER ENDOSCOPY;  Surgeon: Gayland Curry, MD;  Location: [facility];  Service: General;  Laterality: N/A;  . HAND SURGERY Left    X 3 - spider bite and MRSA infection  . HYSTEROSCOPY WITH RESECTOSCOPE N/A 05/30/2014   Procedure: HYSTEROSCOPY WITH RESECTOSCOPE;  Surgeon: Jamey Reas de Berton Lan, MD;  Location: [facility];  Service: Gynecology;  Laterality: N/A;  . KNEE  ARTHROSCOPY Right 12/29/2012   Procedure: RIGHT KNEE ARTHROSCOPY  PARTIAL MEDIAL AND LATERAL MENISECTOMY WITH CHONDROPLASTY;  Surgeon: Hessie Dibble, MD;  Location: [facility];  Service: Orthopedics;  Laterality: Right;  . PELVIC LAPAROSCOPY  1990's   d/t endometriosis  . TONSILLECTOMY  as child  . WRIST SURGERY Right 2005    Current Outpatient Prescriptions  Medication Sig Dispense Refill  . Calcium-Vitamin D-Vitamin K (CHEWABLE CALCIUM PO) Take 1 tablet by mouth daily.    Marland Kitchen dicyclomine (BENTYL) 20 MG tablet Take 1 tablet (20 mg total) by mouth 3 (three) times daily as needed for spasms. 90 tablet 0  . DULoxetine (CYMBALTA) 60 MG capsule Take 120 mg by mouth every morning.     Marland Kitchen LYRICA 25 MG capsule Take 3 times daily    . Melatonin 1 MG SUBL Place 3 mg under the tongue at bedtime. 1 tablet 2  . Multiple Vitamin (MULTIVITAMIN) tablet Take 1 tablet by mouth daily.    . Vitamin D, Ergocalciferol, (DRISDOL) 50000 units CAPS capsule Take 1 capsule (50,000 Units total) by mouth every 7 (seven) days. 12 capsule 0   No current facility-administered medications for this visit.      ALLERGIES: Sulfa antibiotics; Bactrim [sulfamethoxazole-trimethoprim]; and Ibuprofen  Family History  Problem Relation Age of Onset  . Diabetes Mother        Living  . Kidney disease Mother   . Hypertension Mother   . Breast cancer Mother 65       dx'd with breast ca x2; 2nd around 30  . Diabetes Maternal Grandmother 70       dec  . Breast cancer Maternal Grandmother        dx in her 73s  . Arthritis Father   . Healthy Sister   . Healthy Sister   . Diabetes Maternal Grandfather   . Stroke Maternal Grandfather   . Healthy Daughter   . Breast cancer Other        2 great aunts  . Thyroid disease Neg Hx     Social History   Social History  . Marital status: Single    Spouse name: N/A  . Number of children: N/A  . Years of education: N/A   Occupational History  . Not on file.    Social History Main Topics  . Smoking status: Never Smoker  . Smokeless tobacco: Never Used  . Alcohol use 1.2 - 2.4 oz/week    2 - 4 Glasses of wine per week     Comment: 2-4 glasses of wine a few times per month  . Drug use: No  . Sexual activity: Yes    Partners: Female  Birth control/ protection: Pill     Comment: Micronor   Other Topics Concern  . 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  PHYSICAL EXAMINATION:    BP 130/80 (BP Location: Right Arm, Patient Position: Sitting, Cuff Size: Large)   Pulse 70   Resp 16   Ht '5\' 8"'  (1.727 m)   Wt 227 lb (103 kg)   LMP 03/28/2017   BMI 34.52 kg/m     General appearance: alert, cooperative and appears stated age   Pelvic: External genitalia:  no lesions              Urethra:  normal appearing urethra with no masses, tenderness or lesions              Bartholins and Skenes: normal                 Vagina: normal appearing vagina with normal color and discharge, no lesions              Cervix: no lesions.  Margaret old blood.                 Bimanual Exam:  Uterus:  normal size, contour, position, consistency, mobility, non-tender              Adnexa: no mass, fullness, tenderness             Chaperone was present for exam.  ASSESSMENT  Menorrhagia with irregular menses.  Status post endometrial ablation.  Possible adenomyosis versus degenerating fibroid.  Benign EMB.  Failed 3 months of Micronor which did not reduce bleeding. Hx DVT on combined OCPs.  Hx endometriosis.   LUQ pain.   Hx gastric bypass in 2015. Increased risk of breast cancer.     PLAN  Discussed perimenopausal anovulatory bleeding and  adenomyosis/endometriosis. Reviewed options for care:  Depo Provera, Aygestin, laparoscopic hysterectomy.  Will do a trial of Aygestin 5 mg daily.  Get records for w/u of DVT.  She will complete her GI evaluation.  If her upper abdominal pain persists and she proceeds with plans for hysterectomy, will have her see bariatric surgeon of her choice.  She states she may want another surgical opinion. Follow up in 4 weeks.   An After Visit Summary was printed and given to the patient.  __25____ minutes face to face time of which over 50% was spent in counseling.

## 2017-04-10 ENCOUNTER — Ambulatory Visit (AMBULATORY_SURGERY_CENTER): Payer: BLUE CROSS/BLUE SHIELD | Admitting: Internal Medicine

## 2017-04-10 ENCOUNTER — Encounter: Payer: Self-pay | Admitting: Internal Medicine

## 2017-04-10 VITALS — BP 120/78 | HR 65 | Temp 97.5°F | Resp 12 | Ht 68.0 in | Wt 227.0 lb

## 2017-04-10 DIAGNOSIS — R194 Change in bowel habit: Secondary | ICD-10-CM

## 2017-04-10 DIAGNOSIS — R1012 Left upper quadrant pain: Secondary | ICD-10-CM | POA: Diagnosis not present

## 2017-04-10 MED ORDER — SODIUM CHLORIDE 0.9 % IV SOLN: 500.0000 mL | INTRAVENOUS | Status: DC

## 2017-04-10 NOTE — Progress Notes (Signed)
Pt's states no medical or surgical changes since previsit or office visit. 

## 2017-04-10 NOTE — Op Note (Signed)
[facility] Patient Name: [name] Procedure Date: 04/10/2017 7:28 AM MRN: [id number] Endoscopist: Gatha Mayer , MD Age: 46 Referring MD:  Date of Birth: 01/06/1971 Gender: Female Account #: [id number] Procedure:                Upper GI endoscopy Indications:              Abdominal pain in the left upper quadrant Medicines:                Propofol per Anesthesia, Monitored Anesthesia Care Procedure:                Pre-Anesthesia Assessment:                           - Prior to the procedure, a History and Physical                            was performed, and patient medications and                            allergies were reviewed. The patient's tolerance of                            previous anesthesia was also reviewed. The risks                            and benefits of the procedure and the sedation                            options and risks were discussed with the patient.                            All questions were answered, and informed consent                            was obtained. Prior Anticoagulants: The patient has                            taken no previous anticoagulant or antiplatelet                            agents. ASA Grade Assessment: II - A patient with                            mild systemic disease. After reviewing the risks                            and benefits, the patient was deemed in                            satisfactory condition to undergo the procedure.                           After obtaining informed consent, the endoscope was  passed under direct vision. Throughout the                            procedure, the patient's blood pressure, pulse, and                            oxygen saturations were monitored continuously. The                            Endoscope was introduced through the mouth, and                            advanced to the afferent and efferent jejunal         loops. The upper GI endoscopy was accomplished                            without difficulty. The patient tolerated the                            procedure well. Scope In: Scope Out: Findings:                 The examined esophagus was normal.                           Evidence of a gastric bypass was found. A gastric                            pouch with a 4 cm length from the GE junction (40                            cm) to the gastrojejunal anastomosis (44 cm) was                            found containing suture material. The gastrojejunal                            anastomosis was characterized by healthy appearing                            mucosa. This was traversed. The pouch-to-jejunum                            limb was characterized by healthy appearing mucosa.                            There was a Bilroth II conficuration to the                            anastomosis                           The examined jejunum was normal.  The gastric pouch were normal on retroflexion. Complications:            No immediate complications. Estimated Blood Loss:     Estimated blood loss: none. Impression:               - Normal esophagus.                           - Gastric bypass with a pouch 4 cm in length.                            Gastrojejunal anastomosis (afferent - short - and                            efferent limbs) characterized by healthy appearing                            mucosa.                           - Normal examined jejunum.                           - No specimens collected. Recommendation:           - Patient has a contact number available for                            emergencies. The signs and symptoms of potential                            delayed complications were discussed with the                            patient. Return to normal activities tomorrow.                            Written discharge instructions were  provided to the                            patient.                           - Resume previous diet.                           - Continue present medications.                           - See the other procedure note for documentation of                            additional recommendations. Gatha Mayer, MD 04/10/2017 8:27:37 AM This report has been signed electronically.

## 2017-04-10 NOTE — Op Note (Signed)
[facility] Patient Name: [name] Procedure Date: 04/10/2017 7:27 AM MRN: [id number] Endoscopist: Gatha Mayer , MD Age: 46 Referring MD:  Date of Birth: 1971/04/11 Gender: Female Account #: [id number] Procedure:                Colonoscopy Indications:              Change in bowel habits Medicines:                Propofol per Anesthesia, Monitored Anesthesia Care Procedure:                Pre-Anesthesia Assessment:                           - Prior to the procedure, a History and Physical                            was performed, and patient medications and                            allergies were reviewed. The patient's tolerance of                            previous anesthesia was also reviewed. The risks                            and benefits of the procedure and the sedation                            options and risks were discussed with the patient.                            All questions were answered, and informed consent                            was obtained. Prior Anticoagulants: The patient has                            taken no previous anticoagulant or antiplatelet                            agents. ASA Grade Assessment: II - A patient with                            mild systemic disease. After reviewing the risks                            and benefits, the patient was deemed in                            satisfactory condition to undergo the procedure.                           After obtaining informed consent, the colonoscope  was passed under direct vision. Throughout the                            procedure, the patient's blood pressure, pulse, and                            oxygen saturations were monitored continuously. The                            Model CF-HQ190L [id number]) scope was introduced                            through the anus and advanced to the the cecum,                            identified by  appendiceal orifice and ileocecal                            valve. The quality of the bowel preparation was                            adequate. The ileocecal valve, appendiceal orifice,                            and rectum were photographed. Scope In: 4:25:95 AM Scope Out: 8:07:03 AM Scope Withdrawal Time: 0 hours 14 minutes 2 seconds  Total Procedure Duration: 0 hours 20 minutes 14 seconds  Findings:                 The entire examined colon appeared normal on direct                            and retroflexion views. Complications:            No immediate complications. Estimated blood loss:                            None. Estimated Blood Loss:     Estimated blood loss: none. Impression:               - The entire examined colon is normal on direct and                            retroflexion views.                           - No specimens collected. Recommendation:           - Repeat colonoscopy in 10 years for screening                            purposes.                           - Resume previous diet.                           -  Continue present medications.                           - Start dicyclomine Gatha Mayer, MD 04/10/2017 8:29:22 AM This report has been signed electronically.

## 2017-04-10 NOTE — Progress Notes (Signed)
Report given to PACU, vss 

## 2017-04-10 NOTE — Patient Instructions (Addendum)
The upper endoscopy and colonoscopy are normal.  I do not know where the pain is coming from - so far the testing of the GI tract with these exams and CT look ok. One possibility is that there are some spasms of the intestines occurring.  Dr. Maudie Mercury prescribed dicyclomine - you should try that to see if it helps (stops intestinal muscle spasms).  If that does not help there are some other things we can try.  Next routine colonoscopy or other screening test in 10 years - 2028  I appreciate the opportunity to care for you. Cheryl Mayer, MD, [facility]   Discharge instructions given. Normal exam. Resume previous medications. YOU HAD AN ENDOSCOPIC PROCEDURE TODAY AT [facility]:   Refer to the procedure report that was given to you for any specific questions about what was found during the examination.  If the procedure report does not answer your questions, please call your gastroenterologist to clarify.  If you requested that your care partner not be given the details of your procedure findings, then the procedure report has been included in a sealed envelope for you to review at your convenience later.  YOU SHOULD EXPECT: Some feelings of bloating in the abdomen. Passage of more gas than usual.  Walking can help get rid of the air that was put into your GI tract during the procedure and reduce the bloating. If you had a lower endoscopy (such as a colonoscopy or flexible sigmoidoscopy) you may notice spotting of blood in your stool or on the toilet paper. If you underwent a bowel prep for your procedure, you may not have a normal bowel movement for a few days.  Please Note:  You might notice some irritation and congestion in your nose or some drainage.  This is from the oxygen used during your procedure.  There is no need for concern and it should clear up in a day or so.  SYMPTOMS TO REPORT IMMEDIATELY:   Following lower endoscopy (colonoscopy or flexible  sigmoidoscopy):  Excessive amounts of blood in the stool  Significant tenderness or worsening of abdominal pains  Swelling of the abdomen that is new, acute  Fever of 100F or higher   Following upper endoscopy (EGD)  Vomiting of blood or coffee ground material  New chest pain or pain under the shoulder blades  Painful or persistently difficult swallowing  New shortness of breath  Fever of 100F or higher  Black, tarry-looking stools  For urgent or emergent issues, a gastroenterologist can be reached at any hour by calling [phone].   DIET:  We do recommend a small meal at first, but then you may proceed to your regular diet.  Drink plenty of fluids but you should avoid alcoholic beverages for 24 hours.  ACTIVITY:  You should plan to take it easy for the rest of today and you should NOT DRIVE or use heavy machinery until tomorrow (because of the sedation medicines used during the test).    FOLLOW UP: Our staff will call the number listed on your records the next business day following your procedure to check on you and address any questions or concerns that you may have regarding the information given to you following your procedure. If we do not reach you, we will leave a message.  However, if you are feeling well and you are not experiencing any problems, there is no need to return our call.  We will assume that you have returned  to your regular daily activities without incident.  If any biopsies were taken you will be contacted by phone or by letter within the next 1-3 weeks.  Please call us at [phone] if you have not heard about the biopsies in 3 weeks.    SIGNATURES/CONFIDENTIALITY: You and/or your care partner have signed paperwork which will be entered into your electronic medical record.  These signatures attest to the fact that that the information above on your After Visit Summary has been reviewed and is understood.  Full responsibility of the confidentiality of  this discharge information lies with you and/or your care-partner.

## 2017-04-11 ENCOUNTER — Telehealth: Payer: Self-pay | Admitting: *Deleted

## 2017-04-11 NOTE — Telephone Encounter (Signed)
  Follow up Call-  Call back number 04/10/2017  Post procedure Call Back phone  # [phone]  Permission to leave phone message Yes  Some recent data might be hidden     Patient questions:  Do you have a fever, pain , or abdominal swelling? No. Pain Score  0 *  Have you tolerated food without any problems? Yes.    Have you been able to return to your normal activities? Yes.    Do you have any questions about your discharge instructions: Diet   No. Medications  No. Follow up visit  No.  Do you have questions or concerns about your Care? No.  Actions: * If pain score is 4 or above: No action needed, pain <4.

## 2017-04-13 ENCOUNTER — Telehealth: Payer: Self-pay | Admitting: Obstetrics and Gynecology

## 2017-04-13 DIAGNOSIS — Z86718 Personal history of other venous thrombosis and embolism: Secondary | ICD-10-CM

## 2017-04-13 DIAGNOSIS — N921 Excessive and frequent menstruation with irregular cycle: Secondary | ICD-10-CM

## 2017-04-13 NOTE — Telephone Encounter (Signed)
Please contact patient in follow up to records received from [facility].  No work up for DVT noted.   I would recommend a referral to hematology/oncology for further evaluation.  She had a DVT while on OCPs.  I think this is important for her. We are treating abnormal uterine bleeding with progesterone medication, and we are not certain if a hysterectomy is in her future.

## 2017-04-15 NOTE — Telephone Encounter (Signed)
Spoke with patient, advised as seen below per Dr. Quincy Simmonds. Patient accepts referral to hematology for further evaluation for history of DVT while on OCP. Advised patient our referral coordinator will f/u with scheduling. Patient verbalizes understanding and is agreeable.  Order placed for referral to hematology/oncology.  Patient is agreeable to disposition. Will close encounter.  Cc: Thayer Ohm

## 2017-04-21 ENCOUNTER — Telehealth: Payer: Self-pay | Admitting: Obstetrics and Gynecology

## 2017-04-21 NOTE — Telephone Encounter (Signed)
Call to [facility] @ Dr. Antonieta Pert office regarding the status of a referral.

## 2017-04-29 DIAGNOSIS — R202 Paresthesia of skin: Secondary | ICD-10-CM | POA: Diagnosis not present

## 2017-04-29 DIAGNOSIS — R079 Chest pain, unspecified: Secondary | ICD-10-CM | POA: Diagnosis not present

## 2017-04-29 DIAGNOSIS — R1012 Left upper quadrant pain: Secondary | ICD-10-CM | POA: Diagnosis not present

## 2017-05-01 ENCOUNTER — Encounter: Payer: Self-pay | Admitting: Family Medicine

## 2017-05-01 ENCOUNTER — Ambulatory Visit: Payer: BLUE CROSS/BLUE SHIELD | Admitting: Obstetrics and Gynecology

## 2017-05-01 DIAGNOSIS — Z713 Dietary counseling and surveillance: Secondary | ICD-10-CM | POA: Diagnosis not present

## 2017-05-02 ENCOUNTER — Ambulatory Visit: Payer: BLUE CROSS/BLUE SHIELD | Admitting: Family Medicine

## 2017-05-05 ENCOUNTER — Encounter: Payer: Self-pay | Admitting: Family Medicine

## 2017-05-05 ENCOUNTER — Other Ambulatory Visit: Payer: BLUE CROSS/BLUE SHIELD

## 2017-05-05 ENCOUNTER — Ambulatory Visit (INDEPENDENT_AMBULATORY_CARE_PROVIDER_SITE_OTHER): Payer: BLUE CROSS/BLUE SHIELD | Admitting: Family Medicine

## 2017-05-05 VITALS — BP 140/70 | HR 73 | Ht 68.0 in | Wt 236.0 lb

## 2017-05-05 DIAGNOSIS — M1712 Unilateral primary osteoarthritis, left knee: Secondary | ICD-10-CM

## 2017-05-05 DIAGNOSIS — M25569 Pain in unspecified knee: Secondary | ICD-10-CM | POA: Diagnosis not present

## 2017-05-05 NOTE — Addendum Note (Signed)
Addended by: Douglass Rivers T on: 05/05/2017 04:34 PM   Modules accepted: Orders

## 2017-05-05 NOTE — Assessment & Plan Note (Signed)
Patient was given viscous supplementation after failing all conservative therapy. Has responded fairly well to this in the past. We discussed icing regimen, bracing, topical anti-inflammatories and once weekly vitamin D. Patient did have a small amount of aspiration we'll send to lab. Patient come back in 1 week for second in a series of 3 injections

## 2017-05-05 NOTE — Progress Notes (Signed)
[name] [address] [place], [place] [address] Phone: [phone] Subjective:    I'm seeing this patient by the request  of:    CC:   [id number]  [name] is a 46 y.o. female coming in with complaint of knee pain. Her knee is not doing better. She would like it drained and would like to talk about some injection options.  Onset-  Location Duration-  Character- Aggravating factors- Reliving factors-  Therapies tried-  Severity-     Past Medical History:  Diagnosis Date  . Anxiety and depression   . Complication of anesthesia    has been hard to wake up post-op  . Dental crowns present   . Diabetes ([place])    resolved with gastric bypass  . DVT of lower extremity (deep venous thrombosis) ([place]) 2012   LLE  . Dysmenorrhea    hx endometriosis and fibroid  . Family history of breast cancer   . Headache(784.0)    tension or sinus  . History of MRSA infection prior to 2012   lasted 5-6 yr.  . Jaw snapping    states jaw pops  . Left foot drop 2016   --[facility]  . Neuropathy 2015   hands, feet and face-- neurology  . Right knee pain    hx meniscal injury, chondromalacia, OA   Past Surgical History:  Procedure Laterality Date  . CHOLECYSTECTOMY  1990's  . DILITATION & CURRETTAGE/HYSTROSCOPY WITH NOVASURE ABLATION N/A 05/30/2014   Procedure: DILATATION & CURETTAGE/HYSTEROSCOPY WITH attempted [place] and with IUD removal ;  Surgeon: Jamey Reas de Berton Lan, MD;  Location: [facility];  Service: Gynecology;  Laterality: N/A;  . GASTRIC ROUX-EN-Y N/A 11/02/2013   Procedure: LAPAROSCOPIC ROUX-EN-Y GASTRIC BYPASS WITH UPPER ENDOSCOPY;  Surgeon: Gayland Curry, MD;  Location: [facility];  Service: General;  Laterality: N/A;  . HAND SURGERY Left    X 3 - spider bite and MRSA infection  . HYSTEROSCOPY WITH RESECTOSCOPE N/A 05/30/2014   Procedure: HYSTEROSCOPY WITH RESECTOSCOPE;  Surgeon: Jamey Reas de  Berton Lan, MD;  Location: [facility];  Service: Gynecology;  Laterality: N/A;  . KNEE ARTHROSCOPY Right 12/29/2012   Procedure: RIGHT KNEE ARTHROSCOPY  PARTIAL MEDIAL AND LATERAL MENISECTOMY WITH CHONDROPLASTY;  Surgeon: Hessie Dibble, MD;  Location: [facility];  Service: Orthopedics;  Laterality: Right;  . PELVIC LAPAROSCOPY  1990's   d/t endometriosis  . TONSILLECTOMY  as child  . WRIST SURGERY Right 2005   Social History   Social History  . Marital status: Married    Spouse name: N/A  . Number of children: N/A  . Years of education: N/A   Social History Main Topics  . Smoking status: Never Smoker  . Smokeless tobacco: Never Used  . Alcohol use 1.2 - 2.4 oz/week    2 - 4 Glasses of wine per week     Comment: 2-4 glasses of wine a few times per month  . Drug use: No  . Sexual activity: Yes    Partners: Female    Birth control/ protection: Pill     Comment: Micronor   Other Topics Concern  . 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 Sulfa Antibiotics Other (See Comments)    HALLUCINATIONS, FEVER, CHILLS  . Bactrim [Sulfamethoxazole-Trimethoprim] Other (See Comments)    Hallucinations, fever chills  . Ibuprofen Other (See Comments)    Had gastric bypass. Told not to take NSAIDs.   Family History  Problem Relation Age of Onset  . Diabetes Mother        Living  . Kidney disease Mother   . Hypertension Mother   . Breast cancer Mother 32       dx'd with breast ca x2; 2nd around 16  . Diabetes Maternal Grandmother 49       dec  . Breast cancer Maternal Grandmother        dx in her 23s  . Arthritis Father   . Healthy Sister   . Healthy Sister   . Diabetes Maternal  Grandfather   . Stroke Maternal Grandfather   . Healthy Daughter   . Breast cancer Other        2 great aunts  . Thyroid disease Neg Hx      Past medical history, social, surgical and family history all reviewed in electronic medical record.  No pertanent information unless stated regarding to the chief complaint.   Review of Systems:Review of systems updated and as accurate as of 05/05/17  No headache, visual changes, nausea, vomiting, diarrhea, constipation, dizziness, abdominal pain, skin rash, fevers, chills, night sweats, weight loss, swollen lymph nodes, body aches, joint swelling, muscle aches, chest pain, shortness of breath, mood changes.   Objective  There were no vitals taken for this visit. Systems examined below as of 05/05/17   General: No apparent distress alert and oriented x3 mood and affect normal, dressed appropriately.  HEENT: Pupils equal, extraocular movements intact  Respiratory: Patient's speak in full sentences and does not appear short of breath  Cardiovascular: No lower extremity edema, non tender, no erythema  Skin: Warm dry intact with no signs of infection or rash on extremities or on axial skeleton.  Abdomen: Soft nontender  Neuro: Cranial nerves II through XII are intact, neurovascularly intact in all extremities with 2+ DTRs and 2+ pulses.  Lymph: No lymphadenopathy of posterior or anterior cervical chain or axillae bilaterally.  Gait normal with good balance and coordination.  MSK:  Non tender with full range of motion and good stability and symmetric strength and tone of shoulders, elbows, wrist, hip, knee and ankles bilaterally.     Impression and Recommendations:     This case required medical decision making of moderate complexity.      Note: This dictation was prepared with Dragon dictation along with smaller phrase technology. Any transcriptional errors that result from this process are unintentional.

## 2017-05-05 NOTE — Patient Instructions (Addendum)
Good to see you   You know the drill  I hope it helps I will drain the knee if we get fluid at any point  See you again next week.

## 2017-05-05 NOTE — Progress Notes (Signed)
[name] [address] [place], [place] [address] Phone: [phone] Subjective:    CC: Left knee pain  [id number]  [name] is a 46 y.o. female coming in with complaint of left knee pain. Patient has known degenerative changes of the left knee. Patient has been doing relatively well but unfortunately the steroid injection did not help significant. Certainly having worsening pain again. Patient in the past has responded well to the viscous supplementation.    Past Medical History:  Diagnosis Date  . Anxiety and depression   . Complication of anesthesia    has been hard to wake up post-op  . Dental crowns present   . Diabetes ([place])    resolved with gastric bypass  . DVT of lower extremity (deep venous thrombosis) ([place]) 2012   LLE  . Dysmenorrhea    hx endometriosis and fibroid  . Family history of breast cancer   . Headache(784.0)    tension or sinus  . History of MRSA infection prior to 2012   lasted 5-6 yr.  . Jaw snapping    states jaw pops  . Left foot drop 2016   --[facility]  . Neuropathy 2015   hands, feet and face--[facility]  . Right knee pain    hx meniscal injury, chondromalacia, OA   Past Surgical History:  Procedure Laterality Date  . CHOLECYSTECTOMY  1990's  . DILITATION & CURRETTAGE/HYSTROSCOPY WITH NOVASURE ABLATION N/A 05/30/2014   Procedure: DILATATION & CURETTAGE/HYSTEROSCOPY WITH attempted [place] and with IUD removal ;  Surgeon: Jamey Reas de Berton Lan, MD;  Location: [facility];  Service: Gynecology;  Laterality: N/A;  . GASTRIC ROUX-EN-Y N/A 11/02/2013   Procedure: LAPAROSCOPIC ROUX-EN-Y GASTRIC BYPASS WITH UPPER ENDOSCOPY;  Surgeon: Gayland Curry, MD;  Location: [facility];  Service: General;  Laterality: N/A;  . HAND SURGERY Left    X 3 - spider bite and MRSA infection  . HYSTEROSCOPY WITH RESECTOSCOPE N/A 05/30/2014   Procedure: HYSTEROSCOPY WITH RESECTOSCOPE;  Surgeon: Jamey Reas de Berton Lan, MD;  Location: [facility];  Service: Gynecology;  Laterality: N/A;  . KNEE ARTHROSCOPY Right 12/29/2012   Procedure: RIGHT KNEE ARTHROSCOPY  PARTIAL MEDIAL AND LATERAL MENISECTOMY WITH CHONDROPLASTY;  Surgeon: Hessie Dibble, MD;  Location: [facility];  Service: Orthopedics;  Laterality: Right;  . PELVIC LAPAROSCOPY  1990's   d/t endometriosis  . TONSILLECTOMY  as child  . WRIST SURGERY Right 2005   Social History   Social History  . Marital status: Married    Spouse name: N/A  . Number of children: N/A  . Years of education: N/A   Social History Main Topics  . Smoking status: Never Smoker  . Smokeless tobacco: Never Used  . Alcohol use 1.2 - 2.4 oz/week    2 - 4 Glasses of wine per week     Comment: 2-4 glasses of wine a few times per month  . Drug use: No  . Sexual activity: Yes    Partners: Female    Birth control/ protection: Pill     Comment: Micronor   Other Topics Concern  . 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 Sulfa Antibiotics Other (See Comments)    HALLUCINATIONS, FEVER, CHILLS  . Bactrim [Sulfamethoxazole-Trimethoprim] Other (See Comments)    Hallucinations, fever chills  . Ibuprofen Other (See Comments)    Had gastric bypass. Told not to take NSAIDs.   Family History  Problem Relation Age of Onset  . Diabetes Mother        Living  . Kidney disease Mother   . Hypertension Mother   . Breast cancer Mother 72       dx'd with breast ca x2; 2nd around 34  . Diabetes Maternal Grandmother 51       dec  . Breast cancer Maternal Grandmother        dx in her 16s  . Arthritis Father   . Healthy Sister   . Healthy Sister   . Diabetes  Maternal Grandfather   . Stroke Maternal Grandfather   . Healthy Daughter   . Breast cancer Other        2 great aunts  . Thyroid disease Neg Hx      Past medical history, social, surgical and family history all reviewed in electronic medical record.  No pertanent information unless stated regarding to the chief complaint.   Review of Systems:Review of systems updated and as accurate as of 05/05/17  No headache, visual changes, nausea, vomiting, diarrhea, constipation, dizziness, abdominal pain, skin rash, fevers, chills, night sweats, weight loss, swollen lymph nodes, body aches, joint swelling, chest pain, shortness of breath, mood changes. Positive muscle aches  Objective  Blood pressure 140/70, pulse 73, height 5\' 8"  (1.727 m), weight 236 lb (107 kg), SpO2 97 %. Systems examined below as of 05/05/17   General: No apparent distress alert and oriented x3 mood and affect normal, dressed appropriately.  HEENT: Pupils equal, extraocular movements intact  Respiratory: Patient's speak in full sentences and does not appear short of breath  Cardiovascular: No lower extremity edema, non tender, no erythema  Skin: Warm dry intact with no signs of infection or rash on extremities or on axial skeleton.  Abdomen: Soft nontender  Neuro: Cranial nerves II through XII are intact, neurovascularly intact in all extremities with 2+ DTRs and 2+ pulses.  Lymph: No lymphadenopathy of posterior or anterior cervical chain or axillae bilaterally.  Gait normal with good balance and coordination.  MSK:  Non tender with full range of motion and good stability and symmetric strength and tone of shoulders, elbows, wrist, hip, and ankles bilaterally.  Knee: Left knee valgus deformity noted. Large thigh to calf ratio. Trace effusion noted Tender to palpation over medial and PF joint line.  ROM full in flexion and extension and lower leg rotation. instability with valgus force.  painful patellar  compression. Patellar glide with moderate crepitus. Patellar and quadriceps tendons unremarkable. Hamstring and quadriceps strength is normal. Contralateral knee shows mild arthritic changes with crepitus.  After informed written and verbal consent, patient was seated on exam table. Left knee was prepped with alcohol swab and utilizing anterolateral approach, patient's left knee space was injected with16 mg/2.5 mL of Synvisc (sodium hyaluronate) in a prefilled syringe was injected easily into the knee through a 22-gauge needle.. Patient tolerated the procedure well without immediate complications.    Impression and Recommendations:     This case required medical decision making of moderate complexity.  Note: This dictation was prepared with Dragon dictation along with smaller phrase technology. Any transcriptional errors that result from this process are unintentional.

## 2017-05-06 LAB — SYNOVIAL CELL COUNT + DIFF, W/ CRYSTALS
BASOPHILS, %: 0 %
EOSINOPHILS-SYNOVIAL: 0 % (ref 0–2)
Lymphocytes-Synovial Fld: 95 % — ABNORMAL HIGH (ref 0–74)
Monocyte/Macrophage: 4 % (ref 0–69)
Neutrophil, Synovial: 0 % (ref 0–24)
SYNOVIOCYTES, %: 1 % (ref 0–15)
WBC, SYNOVIAL: 149 {cells}/uL (ref <150)

## 2017-05-07 NOTE — Progress Notes (Unsigned)
looks

## 2017-05-10 DIAGNOSIS — R079 Chest pain, unspecified: Secondary | ICD-10-CM | POA: Diagnosis not present

## 2017-05-10 DIAGNOSIS — R1012 Left upper quadrant pain: Secondary | ICD-10-CM | POA: Diagnosis not present

## 2017-05-12 ENCOUNTER — Ambulatory Visit (INDEPENDENT_AMBULATORY_CARE_PROVIDER_SITE_OTHER): Payer: BLUE CROSS/BLUE SHIELD | Admitting: Family Medicine

## 2017-05-12 ENCOUNTER — Encounter: Payer: Self-pay | Admitting: Family Medicine

## 2017-05-12 DIAGNOSIS — M1712 Unilateral primary osteoarthritis, left knee: Secondary | ICD-10-CM

## 2017-05-12 NOTE — Progress Notes (Signed)
[name] [address] [place], [place] [address] Phone: [phone] Subjective:     CC: Left knee pain follow-up  [id number]  [name] is a 46 y.o. female coming in with complaint of   Here for second in a series of 3 injections for viscous supplementation after failing all conservative therapy. Mild improvement after the first injection    Past Medical History:  Diagnosis Date  . Anxiety and depression   . Complication of anesthesia    has been hard to wake up post-op  . Dental crowns present   . Diabetes ([place])    resolved with gastric bypass  . DVT of lower extremity (deep venous thrombosis) ([place]) 2012   LLE  . Dysmenorrhea    hx endometriosis and fibroid  . Family history of breast cancer   . Headache(784.0)    tension or sinus  . History of MRSA infection prior to 2012   lasted 5-6 yr.  . Jaw snapping    states jaw pops  . Left foot drop 2016   --[facility]  . Neuropathy 2015   hands, feet and face--[facility]  . Right knee pain    hx meniscal injury, chondromalacia, OA   Past Surgical History:  Procedure Laterality Date  . CHOLECYSTECTOMY  1990's  . DILITATION & CURRETTAGE/HYSTROSCOPY WITH NOVASURE ABLATION N/A 05/30/2014   Procedure: DILATATION & CURETTAGE/HYSTEROSCOPY WITH attempted [place] and with IUD removal ;  Surgeon: Jamey Reas de Berton Lan, MD;  Location: [facility];  Service: Gynecology;  Laterality: N/A;  . GASTRIC ROUX-EN-Y N/A 11/02/2013   Procedure: LAPAROSCOPIC ROUX-EN-Y GASTRIC BYPASS WITH UPPER ENDOSCOPY;  Surgeon: Gayland Curry, MD;  Location: [facility];  Service: General;  Laterality: N/A;  . HAND SURGERY Left    X 3 - spider bite and MRSA infection  . HYSTEROSCOPY WITH RESECTOSCOPE N/A 05/30/2014   Procedure: HYSTEROSCOPY WITH RESECTOSCOPE;  Surgeon: Jamey Reas de Berton Lan, MD;  Location: [facility];  Service: Gynecology;  Laterality: N/A;  . KNEE ARTHROSCOPY  Right 12/29/2012   Procedure: RIGHT KNEE ARTHROSCOPY  PARTIAL MEDIAL AND LATERAL MENISECTOMY WITH CHONDROPLASTY;  Surgeon: Hessie Dibble, MD;  Location: [facility];  Service: Orthopedics;  Laterality: Right;  . PELVIC LAPAROSCOPY  1990's   d/t endometriosis  . TONSILLECTOMY  as child  . WRIST SURGERY Right 2005   Social History   Social History  . Marital status: Married    Spouse name: N/A  . Number of children: N/A  . Years of education: N/A   Social History Main Topics  . Smoking status: Never Smoker  . Smokeless tobacco: Never Used  . Alcohol use 1.2 - 2.4 oz/week    2 - 4 Glasses of wine per week     Comment: 2-4 glasses of wine a few times per month  . Drug use: No  . Sexual activity: Yes    Partners: Female    Birth control/ protection: Pill     Comment: Micronor   Other Topics Concern  . 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 Sulfa Antibiotics Other (See Comments)    HALLUCINATIONS, FEVER, CHILLS  . Bactrim [Sulfamethoxazole-Trimethoprim] Other (See Comments)    Hallucinations, fever chills  . Ibuprofen Other (See Comments)    Had gastric bypass. Told not to take NSAIDs.   Family History  Problem Relation Age of Onset  . Diabetes Mother        Living  . Kidney disease Mother   . Hypertension Mother   . Breast cancer Mother 28       dx'd with breast ca x2; 2nd around 1  . Diabetes Maternal Grandmother 58       dec  . Breast cancer Maternal Grandmother        dx in her 48s  . Arthritis Father   . Healthy Sister   . Healthy Sister   . Diabetes Maternal Grandfather   . Stroke Maternal Grandfather   . Healthy Daughter   . Breast cancer Other        2  great aunts  . Thyroid disease Neg Hx      Past medical history, social, surgical and family history all reviewed in electronic medical record.  No pertanent information unless stated regarding to the chief complaint.   Review of Systems:Review of systems updated and as accurate as of 05/12/17  No headache, visual changes, nausea, vomiting, diarrhea, constipation, dizziness, abdominal pain, skin rash, fevers, chills, night sweats, weight loss, swollen lymph nodes, body aches, joint swelling,  chest pain, shortness of breath, mood changes. Positive muscle aches  Objective  There were no vitals taken for this visit. Systems examined below as of 05/12/17   General: No apparent distress alert and oriented x3 mood and affect normal, dressed appropriately.  HEENT: Pupils equal, extraocular movements intact  Respiratory: Patient's speak in full sentences and does not appear short of breath  Cardiovascular: No lower extremity edema, non tender, no erythema  Skin: Warm dry intact with no signs of infection or rash on extremities or on axial skeleton.  Abdomen: Soft nontender  Neuro: Cranial nerves II through XII are intact, neurovascularly intact in all extremities with 2+ DTRs and 2+ pulses.  Lymph: No lymphadenopathy of posterior or anterior cervical chain or axillae bilaterally.  Gait normal with good balance and coordination.  MSK:  Non tender with full range of motion and good stability and symmetric strength and tone of shoulders, elbows, wrist, hip, and ankles bilaterally.  Knee: valgus deformity noted. Large thigh to calf ratio.  Tender to palpation over patellofemoral joint ROM full in flexion and extension and lower leg rotation. instability with valgus force.  painful patellar compression. Patellar glide with ildcrepitus. Patellar and quadriceps tendons unremarkable. Hamstring and quadriceps strength is normal. Contralateral knee shows ild patellofemoral arthritis as well  After  informed written and verbal consent, patient was seated on exam table. Left knee was prepped with alcohol swab and utilizing anterolateral approach, patient's left knee space was injected with 16 mg/2.5 mL of Synvisc (sodium hyaluronate) in a prefilled syringe was injected easily into the knee through a 22-gauge needle.. Patient tolerated the procedure well without immediate complications.    Impression and Recommendations:     This case required medical decision making of moderate complexity.      Note: This dictation was prepared with Dragon dictation along with smaller phrase technology. Any transcriptional errors that result from this process are unintentional.

## 2017-05-12 NOTE — Assessment & Plan Note (Signed)
Second in a series of 3 injections given today. Tolerated the procedure well. Discussed icing regimen and home exercises. Patient will come back in 1 week for third and final injection

## 2017-05-13 ENCOUNTER — Encounter: Payer: Self-pay | Admitting: Family Medicine

## 2017-05-13 ENCOUNTER — Other Ambulatory Visit: Payer: Self-pay | Admitting: Family

## 2017-05-13 DIAGNOSIS — Z9884 Bariatric surgery status: Secondary | ICD-10-CM

## 2017-05-13 DIAGNOSIS — D509 Iron deficiency anemia, unspecified: Secondary | ICD-10-CM

## 2017-05-13 DIAGNOSIS — N921 Excessive and frequent menstruation with irregular cycle: Secondary | ICD-10-CM

## 2017-05-14 ENCOUNTER — Ambulatory Visit (HOSPITAL_BASED_OUTPATIENT_CLINIC_OR_DEPARTMENT_OTHER): Payer: BLUE CROSS/BLUE SHIELD | Admitting: Family

## 2017-05-14 ENCOUNTER — Other Ambulatory Visit (HOSPITAL_BASED_OUTPATIENT_CLINIC_OR_DEPARTMENT_OTHER): Payer: BLUE CROSS/BLUE SHIELD

## 2017-05-14 VITALS — BP 138/78 | HR 80 | Temp 98.4°F | Resp 18 | Wt 233.0 lb

## 2017-05-14 DIAGNOSIS — Z86718 Personal history of other venous thrombosis and embolism: Secondary | ICD-10-CM | POA: Diagnosis not present

## 2017-05-14 DIAGNOSIS — D508 Other iron deficiency anemias: Secondary | ICD-10-CM | POA: Diagnosis not present

## 2017-05-14 DIAGNOSIS — D509 Iron deficiency anemia, unspecified: Secondary | ICD-10-CM

## 2017-05-14 DIAGNOSIS — N921 Excessive and frequent menstruation with irregular cycle: Secondary | ICD-10-CM

## 2017-05-14 DIAGNOSIS — D6852 Prothrombin gene mutation: Secondary | ICD-10-CM

## 2017-05-14 DIAGNOSIS — Z9884 Bariatric surgery status: Secondary | ICD-10-CM

## 2017-05-14 LAB — CBC WITH DIFFERENTIAL (CANCER CENTER ONLY)
BASO#: 0.1 10*3/uL (ref 0.0–0.2)
BASO%: 0.6 % (ref 0.0–2.0)
EOS%: 2.7 % (ref 0.0–7.0)
Eosinophils Absolute: 0.3 10*3/uL (ref 0.0–0.5)
HCT: 39.8 % (ref 34.8–46.6)
HEMOGLOBIN: 13 g/dL (ref 11.6–15.9)
LYMPH#: 1.8 10*3/uL (ref 0.9–3.3)
LYMPH%: 18.4 % (ref 14.0–48.0)
MCH: 31 pg (ref 26.0–34.0)
MCHC: 32.7 g/dL (ref 32.0–36.0)
MCV: 95 fL (ref 81–101)
MONO#: 1.1 10*3/uL — ABNORMAL HIGH (ref 0.1–0.9)
MONO%: 10.5 % (ref 0.0–13.0)
NEUT%: 67.8 % (ref 39.6–80.0)
NEUTROS ABS: 6.8 10*3/uL — AB (ref 1.5–6.5)
PLATELETS: 345 10*3/uL (ref 145–400)
RBC: 4.19 10*6/uL (ref 3.70–5.32)
RDW: 12.9 % (ref 11.1–15.7)
WBC: 10 10*3/uL (ref 3.9–10.0)

## 2017-05-14 LAB — FERRITIN: FERRITIN: 59 ng/mL (ref 9–269)

## 2017-05-14 LAB — IRON AND TIBC
%SAT: 10 % — AB (ref 21–57)
IRON: 42 ug/dL (ref 41–142)
TIBC: 413 ug/dL (ref 236–444)
UIBC: 370 ug/dL (ref 120–384)

## 2017-05-14 LAB — COMPREHENSIVE METABOLIC PANEL
ALT: 12 U/L (ref 0–55)
ANION GAP: 9 meq/L (ref 3–11)
AST: 16 U/L (ref 5–34)
Albumin: 3.6 g/dL (ref 3.5–5.0)
Alkaline Phosphatase: 85 U/L (ref 40–150)
BUN: 10.7 mg/dL (ref 7.0–26.0)
CHLORIDE: 102 meq/L (ref 98–109)
CO2: 26 meq/L (ref 22–29)
CREATININE: 0.7 mg/dL (ref 0.6–1.1)
Calcium: 9.2 mg/dL (ref 8.4–10.4)
Glucose: 108 mg/dl (ref 70–140)
POTASSIUM: 3.6 meq/L (ref 3.5–5.1)
Sodium: 137 mEq/L (ref 136–145)
Total Bilirubin: 0.4 mg/dL (ref 0.20–1.20)
Total Protein: 7.3 g/dL (ref 6.4–8.3)

## 2017-05-14 LAB — LACTATE DEHYDROGENASE: LDH: 178 U/L (ref 125–245)

## 2017-05-14 LAB — CHCC SATELLITE - SMEAR

## 2017-05-14 NOTE — Progress Notes (Signed)
Hematology/Oncology Consultation   Name: Natsuko Kelsay      MRN: [id number]    Location: Room/bed info not found  Date: 05/14/2017 Time:9:14 PM   REFERRING PHYSICIAN: Josefa Half, MD  REASON FOR CONSULT: History of DVT on OCT and Menorrhagia with irregular cycle   DIAGNOSIS:  1. History of DVT on an oral contraceptive 2. Menorrhagia with irregular cycle 3. Iron deficiency anemia   HISTORY OF PRESENT ILLNESS: Ms. Debruyne is a very pleasant 46 yo caucasian female with history of DVT several years ago after starting an oral contraceptive to help regulate her cycles. She had also just had surgery for a spider bite with MRSA and been in the car for an extended period of time travelling on vacation.  She states that after she had her daughter 13 years ago her cycles became heavy and irregular. She had an endometrial ablation recently that failed.  She has history of 2 miscarriages, one at 12 weeks and one at 6 weeks.  Her cycles continue to be irregular (more than twice a month) and heavy.  Her iron saturation is low at 10%. Hgb is stable at 13.0 with an MCV of 95.  She is symptomatic at this time with fatigue and weakness.  She has history of a gastric bypass as well.  She has had some pain in the lower left chest/side off and on and has an appointment with cardiology tomorrow for further work up.  No family history of thrombus or bleeding.  She has a family history of breast cancer. Her mother is currently being treated for her 3rd bout and her maternal grandmother passed away from breast cancer. 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 Her sister has history of cervical and ovarian.  No fever, chills, n/v, cough, rash, dizziness, SOB, chest pain, palpitations, abdominal pain or changes in bowel or bladder habits. She is on crutches today. She has arthritis in her knees and had a reaction to a recent injection. She has had no falls or syncopal episodes.  She states that she has had  numbness and tingling in her upper and lower extremities for quite a while and this is unchanged. She has thyroid nodules but states that these have not been an issue and are monitored by her PCP. Her diabetes resolved after her gastric bypass.  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  She is not a smoker and has 1 glass of wine a week.   ROS: All other 10 point review of systems is negative.   PAST MEDICAL HISTORY:   Past Medical History:  Diagnosis Date  . Anxiety and depression   . Complication of anesthesia    has been hard to wake up post-op  . Dental crowns present   . Diabetes ([place])    resolved with gastric bypass  . DVT of lower extremity (deep venous thrombosis) ([place]) 2012   LLE  . Dysmenorrhea    hx endometriosis and fibroid  . Family history of breast cancer   . Headache(784.0)    tension or sinus  . History of MRSA infection prior to 2012   lasted 5-6 yr.  . Jaw snapping    states jaw pops  . Left foot drop 2016   --[place] [facility]  . Neuropathy 2015   hands, feet and face--[place] [facility]  . Right knee pain    hx meniscal injury, chondromalacia, OA    ALLERGIES: Allergies  Allergen Reactions  . Sulfa Antibiotics  Other (See Comments)    HALLUCINATIONS, FEVER, CHILLS HALLUCINATIONS, FEVER, CHILLS  . Bactrim [Sulfamethoxazole-Trimethoprim] Other (See Comments)    Hallucinations, fever chills  . Ibuprofen Other (See Comments)    Had gastric bypass. Told not to take NSAIDs.      MEDICATIONS:  Current Outpatient Prescriptions on File Prior to Visit  Medication Sig Dispense Refill  . Calcium-Vitamin D-Vitamin K (CHEWABLE CALCIUM PO) Take 1 tablet by mouth daily.    . DULoxetine (CYMBALTA) 60 MG capsule Take 120 mg by mouth every morning.     Marland Kitchen LYRICA 25 MG capsule Take 3 times daily    . Melatonin 1 MG SUBL Place 3 mg under the tongue at bedtime. 1 tablet 2  . Multiple Vitamin (MULTIVITAMIN) tablet Take 1 tablet by mouth daily.    .  norethindrone (AYGESTIN) 5 MG tablet Take 1 tablet (5 mg total) by mouth daily. 30 tablet 1  . Vitamin D, Ergocalciferol, (DRISDOL) 50000 units CAPS capsule Take 1 capsule (50,000 Units total) by mouth every 7 (seven) days. 12 capsule 0   No current facility-administered medications on file prior to visit.      PAST SURGICAL HISTORY Past Surgical History:  Procedure Laterality Date  . CHOLECYSTECTOMY  1990's  . DILITATION & CURRETTAGE/HYSTROSCOPY WITH NOVASURE ABLATION N/A 05/30/2014   Procedure: DILATATION & CURETTAGE/HYSTEROSCOPY WITH attempted [place] and with IUD removal ;  Surgeon: Jamey Reas de Berton Lan, MD;  Location: [facility];  Service: Gynecology;  Laterality: N/A;  . GASTRIC ROUX-EN-Y N/A 11/02/2013   Procedure: LAPAROSCOPIC ROUX-EN-Y GASTRIC BYPASS WITH UPPER ENDOSCOPY;  Surgeon: Gayland Curry, MD;  Location: [facility];  Service: General;  Laterality: N/A;  . HAND SURGERY Left    X 3 - spider bite and MRSA infection  . HYSTEROSCOPY WITH RESECTOSCOPE N/A 05/30/2014   Procedure: HYSTEROSCOPY WITH RESECTOSCOPE;  Surgeon: Jamey Reas de Berton Lan, MD;  Location: [facility];  Service: Gynecology;  Laterality: N/A;  . KNEE ARTHROSCOPY Right 12/29/2012   Procedure: RIGHT KNEE ARTHROSCOPY  PARTIAL MEDIAL AND LATERAL MENISECTOMY WITH CHONDROPLASTY;  Surgeon: Hessie Dibble, MD;  Location: [facility];  Service: Orthopedics;  Laterality: Right;  . PELVIC LAPAROSCOPY  1990's   d/t endometriosis  . TONSILLECTOMY  as child  . WRIST SURGERY Right 2005    FAMILY HISTORY: Family History  Problem Relation Age of Onset  . Diabetes Mother        Living  . Kidney disease Mother   . Hypertension Mother   . Breast cancer Mother 80       dx'd with breast ca x2; 2nd around 67  . Diabetes Maternal Grandmother 51       dec  . Breast cancer Maternal Grandmother        dx in her 51s  . Arthritis Father   . Healthy Sister   . Healthy Sister   .  Diabetes Maternal Grandfather   . Stroke Maternal Grandfather   . Healthy Daughter   . Breast cancer Other        2 great aunts  . Thyroid disease Neg Hx     SOCIAL HISTORY:  reports that she has never smoked. She has never used smokeless tobacco. She reports that she drinks about 1.2 - 2.4 oz of alcohol per week . She reports that she does not use drugs.  PERFORMANCE STATUS: The patient's performance status is 1 - Symptomatic but completely ambulatory  PHYSICAL EXAM: Most  Recent Vital Signs: Blood pressure 138/78, pulse 80, temperature 98.4 F (36.9 C), temperature source Oral, resp. rate 18, weight 233 lb (105.7 kg), SpO2 100 %. BP 138/78 (BP Location: Left Arm, Patient Position: Sitting)   Pulse 80   Temp 98.4 F (36.9 C) (Oral)   Resp 18   Wt 233 lb (105.7 kg)   SpO2 100%   BMI 35.43 kg/m   General Appearance:    Alert, cooperative, no distress, appears stated age  Head:    Normocephalic, without obvious abnormality, atraumatic  Eyes:    PERRL, conjunctiva/corneas clear, EOM's intact, fundi    benign, both eyes        Throat:   Lips, mucosa, and tongue normal; teeth and gums normal  Neck:   Supple, symmetrical, trachea midline, no adenopathy;    thyroid:  no enlargement/tenderness/nodules; no carotid   bruit or JVD  Back:     Symmetric, no curvature, ROM normal, no CVA tenderness  Lungs:     Clear to auscultation bilaterally, respirations unlabored  Chest Wall:    No tenderness or deformity   Heart:    Regular rate and rhythm, S1 and S2 normal, no murmur, rub   or gallop     Abdomen:     Soft, non-tender, bowel sounds active all four quadrants,    no masses, no organomegaly         Extremities:   Extremities normal, atraumatic, no cyanosis or edema  Pulses:   2+ and symmetric all extremities  Skin:   Skin color, texture, turgor normal, no rashes or lesions  Lymph nodes:   Cervical, supraclavicular, and axillary nodes normal  Neurologic:   CNII-XII intact, normal  strength, sensation and reflexes    throughout    LABORATORY DATA:  Results for orders placed or performed in visit on 05/14/17 (from the past 48 hour(s))  CBC w/Diff     Status: Abnormal   Collection Time: 05/14/17 10:54 AM  Result Value Ref Range   WBC 10.0 3.9 - 10.0 10e3/uL   RBC 4.19 3.70 - 5.32 10e6/uL   HGB 13.0 11.6 - 15.9 g/dL   HCT 39.8 34.8 - 46.6 %   MCV 95 81 - 101 fL   MCH 31.0 26.0 - 34.0 pg   MCHC 32.7 32.0 - 36.0 g/dL   RDW 12.9 11.1 - 15.7 %   Platelets 345 145 - 400 10e3/uL   NEUT# 6.8 (H) 1.5 - 6.5 10e3/uL   LYMPH# 1.8 0.9 - 3.3 10e3/uL   MONO# 1.1 (H) 0.1 - 0.9 10e3/uL   Eosinophils Absolute 0.3 0.0 - 0.5 10e3/uL   BASO# 0.1 0.0 - 0.2 10e3/uL   NEUT% 67.8 39.6 - 80.0 %   LYMPH% 18.4 14.0 - 48.0 %   MONO% 10.5 0.0 - 13.0 %   EOS% 2.7 0.0 - 7.0 %   BASO% 0.6 0.0 - 2.0 %  CMP     Status: None   Collection Time: 05/14/17 10:54 AM  Result Value Ref Range   Sodium 137 136 - 145 mEq/L   Potassium 3.6 3.5 - 5.1 mEq/L   Chloride 102 98 - 109 mEq/L   CO2 26 22 - 29 mEq/L   Glucose 108 70 - 140 mg/dl    Comment: Glucose reference range is for nonfasting patients. Fasting glucose reference range is 70- 100.   BUN 10.7 7.0 - 26.0 mg/dL   Creatinine 0.7 0.6 - 1.1 mg/dL   Total Bilirubin 0.40 0.20 - 1.20 mg/dL  Alkaline Phosphatase 85 40 - 150 U/L   AST 16 5 - 34 U/L   ALT 12 0 - 55 U/L   Total Protein 7.3 6.4 - 8.3 g/dL   Albumin 3.6 3.5 - 5.0 g/dL   Calcium 9.2 8.4 - 10.4 mg/dL   Anion Gap 9 3 - 11 mEq/L   EGFR >90 >90 ml/min/1.73 m2    Comment: eGFR is calculated using the CKD-EPI Creatinine Equation (2009)  Smear     Status: None   Collection Time: 05/14/17 10:54 AM  Result Value Ref Range   Smear Result Smear Available   Ferritin     Status: None   Collection Time: 05/14/17 10:54 AM  Result Value Ref Range   Ferritin 59 9 - 269 ng/ml  Iron and TIBC     Status: Abnormal   Collection Time: 05/14/17 10:54 AM  Result Value Ref Range   Iron 42 41 -  142 ug/dL   TIBC 413 236 - 444 ug/dL   UIBC 370 120 - 384 ug/dL   %SAT 10 (L) 21 - 57 %  LDH     Status: None   Collection Time: 05/14/17 10:54 AM  Result Value Ref Range   LDH 178 125 - 245 U/L      RADIOGRAPHY: No results found.     PATHOLOGY: None  ASSESSMENT/PLAN: Ms. Noyes is a very pleasant 46 yo caucasian female with history of DVT several years ago after starting an oral contraceptive/travelling/surgery. After her daughter was born her cycle became heavy and irregular occurring more than twice a month. She is iron deficient and symptomatic with fatigue and weakness at this time.  We will see what the rest of her lab work shows and then determine when to give her IV iron.  Once we have her results gynecology will be able to better determine the best way to regulate her cycle.  We will go ahead and plan to see her back again in another 2 months for follow-up and lab.   All questions were answered and she is in agreement with the plan. She will contact our office with any questions or concerns. We can certainly see  Her sooner if need be.   She was discussed with and also seen by Dr. Marin Olp and he is in agreement with the aforementioned.   [facility] M      Addendum:   I agree with the above assessment by Judson Roch. I saw and examined the patient with Judson Roch.  Her iron studies show iron deficiency. Her ferritin is 59 with an iron saturation of only 10%. I think iron will definitely help her feel better.  We did do a hypercoagulable panel on her. Surprisingly, she does have a Prothrombin II gene mutation. She is heterozygous for this.  She cannot be on on any estrogens. I know this might be an issue for her.  We spent about 40 minutes with her. She is very nice. We answered her questions. She felt much better after talking with this.  I would like to see her back in another 8 weeks.  Lattie Haw, MD

## 2017-05-15 DIAGNOSIS — R0789 Other chest pain: Secondary | ICD-10-CM | POA: Diagnosis not present

## 2017-05-15 DIAGNOSIS — R079 Chest pain, unspecified: Secondary | ICD-10-CM | POA: Diagnosis not present

## 2017-05-15 LAB — PROTEIN C ACTIVITY: PROTEIN C ACTIVITY: 104 % (ref 73–180)

## 2017-05-15 LAB — PROTEIN S, TOTAL: Protein S, Total: 108 % (ref 60–150)

## 2017-05-15 LAB — PROTHROMBIN TIME (PT)
INR: 1 (ref 0.8–1.2)
PROTHROMBIN TIME: 10.6 s (ref 9.1–12.0)

## 2017-05-15 LAB — LUPUS ANTICOAGULANT PANEL
DRVVT: 28.6 s (ref 0.0–47.0)
PTT-LA: 33.1 s (ref 0.0–51.9)

## 2017-05-15 LAB — PROTEIN S ACTIVITY: PROTEIN S ACTIVITY: 76 % (ref 63–140)

## 2017-05-15 LAB — ANTITHROMBIN III: Antithrombin Activity: 127 % (ref 75–135)

## 2017-05-15 LAB — APTT: APTT: 30 s (ref 24–33)

## 2017-05-15 LAB — RETICULOCYTES: Reticulocyte Count: 2.1 % (ref 0.6–2.6)

## 2017-05-16 ENCOUNTER — Encounter: Payer: Self-pay | Admitting: Family Medicine

## 2017-05-16 ENCOUNTER — Other Ambulatory Visit: Payer: BLUE CROSS/BLUE SHIELD

## 2017-05-16 ENCOUNTER — Ambulatory Visit (INDEPENDENT_AMBULATORY_CARE_PROVIDER_SITE_OTHER): Payer: BLUE CROSS/BLUE SHIELD | Admitting: Family Medicine

## 2017-05-16 ENCOUNTER — Ambulatory Visit: Payer: Self-pay

## 2017-05-16 VITALS — BP 144/84 | HR 89 | Temp 98.0°F | Ht 68.0 in | Wt 236.1 lb

## 2017-05-16 DIAGNOSIS — M25462 Effusion, left knee: Secondary | ICD-10-CM

## 2017-05-16 LAB — PROTEIN C, TOTAL: PROTEIN C ANTIGEN: 88 % (ref 60–150)

## 2017-05-16 LAB — VON WILLEBRAND PANEL
FACTOR VIII ACTIVITY: 165 % — AB (ref 57–163)
VON WILLEBRAND FACTOR: 151 % (ref 50–200)
von Willebrand Factor (vWF) Ag: 164 % (ref 50–200)

## 2017-05-16 NOTE — Patient Instructions (Signed)
Thank you for coming in,   We will call you with the results from today.    Please feel free to call with any questions or concerns at any time, at [phone]. --Dr. Clennon Nasca  

## 2017-05-16 NOTE — Progress Notes (Signed)
[name] - 46 y.o. female MRN [id number]  Date of birth: [birth year]  SUBJECTIVE:  Including CC & ROS.  Chief Complaint  Patient presents with  . Knee Pain    Patient is here today C/O left knee pain.      [name] is a 46 year old female that is presenting with left knee pain and effusion. She received her second Synvisc injection on Monday. Since that time she has on ongoing pain and effusion of the knee. Denies any fevers or chills. The pain is generalized in nature. Has not had any trauma to her knee since this time. Denies any locking or giving way. This is throbbing in nature. Has had to use a cane to help with walking as the pain is severe. Has never had this problem with these injections before.     Review of Systems  Constitutional: Negative for fever.  Musculoskeletal: Positive for arthralgias, gait problem and joint swelling.  Skin: Negative for color change.  Neurological: Negative for weakness and numbness.  Hematological: Negative for adenopathy.    HISTORY: Past Medical, Surgical, Social, and Family History Reviewed & Updated per EMR.   Pertinent Historical Findings include:  Past Medical History:  Diagnosis Date  . Anxiety and depression   . Complication of anesthesia    has been hard to wake up post-op  . Dental crowns present   . Diabetes ([place])    resolved with gastric bypass  . DVT of lower extremity (deep venous thrombosis) ([place]) 2012   LLE  . Dysmenorrhea    hx endometriosis and fibroid  . Family history of breast cancer   . Headache(784.0)    tension or sinus  . History of MRSA infection prior to 2012   lasted 5-6 yr.  . Jaw snapping    states jaw pops  . Left foot drop 2016   --[place] neurolog[facility]  . Neuropathy 2015   hands, feet and face-- neurology  . Right knee pain    hx meniscal injury, chondromalacia, OA    Past Surgical History:  Procedure Laterality Date  . CHOLECYSTECTOMY  1990's  . DILITATION & CURRETTAGE/HYSTROSCOPY  WITH NOVASURE ABLATION N/A 05/30/2014   Procedure: DILATATION & CURETTAGE/HYSTEROSCOPY WITH attempted [place] and with IUD removal ;  Surgeon: Jamey Reas de Berton Lan, MD;  Location: [facility];  Service: Gynecology;  Laterality: N/A;  . GASTRIC ROUX-EN-Y N/A 11/02/2013   Procedure: LAPAROSCOPIC ROUX-EN-Y GASTRIC BYPASS WITH UPPER ENDOSCOPY;  Surgeon: Gayland Curry, MD;  Location: [facility];  Service: General;  Laterality: N/A;  . HAND SURGERY Left    X 3 - spider bite and MRSA infection  . HYSTEROSCOPY WITH RESECTOSCOPE N/A 05/30/2014   Procedure: HYSTEROSCOPY WITH RESECTOSCOPE;  Surgeon: Jamey Reas de Berton Lan, MD;  Location: [facility];  Service: Gynecology;  Laterality: N/A;  . KNEE ARTHROSCOPY Right 12/29/2012   Procedure: RIGHT KNEE ARTHROSCOPY  PARTIAL MEDIAL AND LATERAL MENISECTOMY WITH CHONDROPLASTY;  Surgeon: Hessie Dibble, MD;  Location: [facility];  Service: Orthopedics;  Laterality: Right;  . PELVIC LAPAROSCOPY  1990's   d/t endometriosis  . TONSILLECTOMY  as child  . WRIST SURGERY Right 2005    Allergies  Allergen Reactions  . Sulfa Antibiotics Other (See Comments)    HALLUCINATIONS, FEVER, CHILLS HALLUCINATIONS, FEVER, CHILLS  . Bactrim [Sulfamethoxazole-Trimethoprim] Other (See Comments)    Hallucinations, fever chills  . Ibuprofen Other (See Comments)    Had gastric bypass. Told not to take  NSAIDs.    Family History  Problem Relation Age of Onset  . Diabetes Mother        Living  . Kidney disease Mother   . Hypertension Mother   . Breast cancer Mother 52       dx'd with breast ca x2; 2nd around 27  . Diabetes Maternal Grandmother 17       dec  . Breast cancer Maternal Grandmother        dx in her 52s  . Arthritis Father   . Healthy Sister   . Healthy Sister   . Diabetes Maternal Grandfather   . Stroke Maternal Grandfather   . Healthy Daughter   . Breast cancer Other        2 great aunts  . Thyroid disease Neg Hx       Social History   Social History  . Marital status: Married    Spouse name: N/A  . Number of children: N/A  . Years of education: N/A   Occupational History  . Not on file.   Social History Main Topics  . Smoking status: Never Smoker  . Smokeless tobacco: Never Used  . Alcohol use 1.2 - 2.4 oz/week    2 - 4 Glasses of wine per week     Comment: 2-4 glasses of wine a few times per month  . Drug use: No  . Sexual activity: Yes    Partners: Female    Birth control/ protection: Pill     Comment: Micronor   Other Topics Concern  . 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, jewellery level of education:  Masters      Home Situation: lives with daughter      Spiritual Beliefs: none      Lifestyle: no regular exercising; diet is so so      Caffeine use: daily                 PHYSICAL EXAM:  VS: BP (!) 144/84 (BP Location: Right Arm, Patient Position: Sitting, Cuff Size: Normal)   Pulse 89   Temp 98 F (36.7 C) (Oral)   Ht 5\' 8"  (1.727 m)   Wt 236 lb 1.3 oz (107.1 kg)   LMP 04/21/2017   SpO2 98%   BMI 35.90 kg/m  Physical Exam Gen: NAD, alert, cooperative with exam, well-appearing ENT: normal lips, normal nasal mucosa,  Eye: normal EOM, normal conjunctiva and lids CV:  no edema, +2 pedal pulses   Resp: no accessory muscle use, non-labored,  GI: no masses or tenderness, no hernia  Skin: no rashes, no areas of induration  Neuro: normal tone, normal sensation to touch Psych:  normal insight, alert and oriented MSK:  Left knee: Obvious effusion. Limited range of motion secondary to pain. No overlying redness. Ambulation causes pain asked to walk with a single-point cane. No significant area tenderness to the mediolateral joint line but generalized tenderness. No pain with patellar grind. Neurovascularly intact.   Aspiration/Injection Procedure Note [name] [birth year]  Procedure:  Aspiration Indications: Left knee pain and swelling  Procedure Details Consent: Risks of procedure as well as the alternatives and risks of each were explained to the (patient/caregiver).  Consent for procedure obtained. Time Out: Verified patient identification, verified procedure, site/side was marked, verified correct patient position, special equipment/implants available, medications/allergies/relevent history reviewed, required imaging and test results available.  Performed.  The area was cleaned  with iodine and alcohol swabs.    The left knee joint was approach at the superior lateral aspect. 3 mL of 1% lidocaine without epinephrine was used to anesthetize the skin and the tract. A 21-gauge needle and a 60 mL syringe was inserted into the suprapatellar pouch with ultrasound guidance and aspirated.  Ultrasound was used. Images were obtained in Long views showing the injection.    Amount of Fluid Aspirated: 60 cc Character of Fluid: purulent appearing and cloudy Fluid was sent for: Gram stain, cytology, cell count, and crystals A sterile dressing was applied.  Patient did tolerate procedure well.       ASSESSMENT & PLAN:   Effusion of left knee Possible to have a reaction to the hyaluronic acid versus an infection. - Aspiration today - Fluid was sent for Gram stain and cytology - Mediated sending antibiotics to suggest infection - Counseled on seeking immediate care if her symptoms or develops a fever.

## 2017-05-17 ENCOUNTER — Encounter: Payer: Self-pay | Admitting: Family Medicine

## 2017-05-17 DIAGNOSIS — M25462 Effusion, left knee: Secondary | ICD-10-CM | POA: Insufficient documentation

## 2017-05-17 LAB — GRAM STAIN
MICRO NUMBER:: 81110428
SPECIMEN QUALITY:: ADEQUATE

## 2017-05-17 LAB — BETA-2-GLYCOPROTEIN I ABS, IGG/M/A: Beta-2 Glycoprotein I Ab, IgG: <9 GPI IgG units (ref 0–20)

## 2017-05-17 LAB — TIQ-NTM

## 2017-05-17 LAB — CARDIOLIPIN ANTIBODIES, IGG, IGM, IGA
Anticardiolipin Ab,IgA,Qn: <9 APL U/mL (ref 0–11)
Anticardiolipin Ab,IgM,Qn: <9 MPL U/mL (ref 0–12)

## 2017-05-17 MED ORDER — CIPROFLOXACIN HCL 500 MG PO TABS: 500.0000 mg | ORAL_TABLET | Freq: Two times a day (BID) | ORAL | 0 refills | Status: DC

## 2017-05-17 MED ORDER — DOXYCYCLINE HYCLATE 100 MG PO TABS: 100.0000 mg | ORAL_TABLET | Freq: Two times a day (BID) | ORAL | 0 refills | Status: DC

## 2017-05-17 NOTE — Progress Notes (Signed)
[name] [place], [place] [address] Phone: [phone] Subjective:    I'm seeing this patient by the request  of:    CC: Knee arthritis follow-up  ERX:[id number]  Lenny Bouchillon is a 46 y.o. female coming in with complaint of knee arthritis. Was going under his viscous supplementation. Patient returned on Friday with another provider for swelling knee. Patient did have aspiration done showing elevated white blood cell count but Gram stain negative. Difficult to assess if possible infectious etiology. Over the weekend started on doxycycline and ciprofloxacin. Patient states Overall doing significantly better from what was going on on Friday. Patient states still significantly sore. Patient is still very frustrated though because she would like to start increasing activity and is unable to do so. Patient states that the pain seems to be severe. Patient is wondering what else can be done.    Past Medical History:  Diagnosis Date  . Anxiety and depression   . Complication of anesthesia    has been hard to wake up post-op  . Dental crowns present   . Diabetes ([place])    resolved with gastric bypass  . DVT of lower extremity (deep venous thrombosis) ([place]) 2012   LLE  . Dysmenorrhea    hx endometriosis and fibroid  . Family history of breast cancer   . Headache(784.0)    tension or sinus  . History of MRSA infection prior to 2012   lasted 5-6 yr.  . Jaw snapping    states jaw pops  . Left foot drop 2016   --[place] neurolog[facility]  . Neuropathy 2015   hands, feet and face--[place] [facility]  . Right knee pain    hx meniscal injury, chondromalacia, OA   Past Surgical History:  Procedure Laterality Date  . CHOLECYSTECTOMY  1990's  . DILITATION & CURRETTAGE/HYSTROSCOPY WITH NOVASURE ABLATION N/A 05/30/2014   Procedure: DILATATION & CURETTAGE/HYSTEROSCOPY WITH attempted [place] and with IUD removal ;  Surgeon: Jamey Reas de  Berton Lan, MD;  Location: [facility];  Service: Gynecology;  Laterality: N/A;  . GASTRIC ROUX-EN-Y N/A 11/02/2013   Procedure: LAPAROSCOPIC ROUX-EN-Y GASTRIC BYPASS WITH UPPER ENDOSCOPY;  Surgeon: Gayland Curry, MD;  Location: [facility];  Service: General;  Laterality: N/A;  . HAND SURGERY Left    X 3 - spider bite and MRSA infection  . HYSTEROSCOPY WITH RESECTOSCOPE N/A 05/30/2014   Procedure: HYSTEROSCOPY WITH RESECTOSCOPE;  Surgeon: Jamey Reas de Berton Lan, MD;  Location: [facility];  Service: Gynecology;  Laterality: N/A;  . KNEE ARTHROSCOPY Right 12/29/2012   Procedure: RIGHT KNEE ARTHROSCOPY  PARTIAL MEDIAL AND LATERAL MENISECTOMY WITH CHONDROPLASTY;  Surgeon: Hessie Dibble, MD;  Location: [facility];  Service: Orthopedics;  Laterality: Right;  . PELVIC LAPAROSCOPY  1990's   d/t endometriosis  . TONSILLECTOMY  as child  . WRIST SURGERY Right 2005   Social History   Social History  . Marital status: Married    Spouse name: N/A  . Number of children: N/A  . Years of education: N/A   Social History Main Topics  . Smoking status: Never Smoker  . Smokeless tobacco: Never Used  . Alcohol use 1.2 - 2.4 oz/week    2 - 4 Glasses of wine per week     Comment: 2-4 glasses of wine a few times per month  . Drug use: No  . Sexual activity: Yes    Partners: Female  Birth control/ protection: Pill     Comment: Micronor   Other Topics Concern  . 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 Sulfa Antibiotics Other (See Comments)    HALLUCINATIONS, FEVER, CHILLS HALLUCINATIONS, FEVER, CHILLS  . Bactrim [Sulfamethoxazole-Trimethoprim] Other (See Comments)    Hallucinations, fever chills    . Ibuprofen Other (See Comments)    Had gastric bypass. Told not to take NSAIDs.   Family History  Problem Relation Age of Onset  . Diabetes Mother        Living  . Kidney disease Mother   . Hypertension Mother   . Breast cancer Mother 16       dx'd with breast ca x2; 2nd around 76  . Diabetes Maternal Grandmother 67       dec  . Breast cancer Maternal Grandmother        dx in her 40s  . Arthritis Father   . Healthy Sister   . Healthy Sister   . Diabetes Maternal Grandfather   . Stroke Maternal Grandfather   . Healthy Daughter   . Breast cancer Other        2 great aunts  . Thyroid disease Neg Hx      Past medical history, social, surgical and family history all reviewed in electronic medical record.  No pertanent information unless stated regarding to the chief complaint.   Review of Systems:Review of systems updated and as accurate as of 05/19/17  No headache, visual changes, nausea, vomiting, diarrhea, constipation, dizziness, abdominal pain, skin rash, fevers, chills, night sweats, weight loss, swollen lymph nodes, , muscle aches, chest pain, shortness of breath, mood changes. Positive joint swelling  Objective  Blood pressure 140/80, pulse 78, height 5\' 8"  (1.727 m), weight 238 lb (108 kg), last menstrual period 04/21/2017, SpO2 92 %. Systems examined below as of 05/19/17   General: No apparent distress alert and oriented x3 mood and affect normal, dressed appropriately.  HEENT: Pupils equal, extraocular movements intact  Respiratory: Patient's speak in full sentences and does not appear short of breath  Cardiovascular: No lower extremity edema, non tender, no erythema  Skin: Warm dry intact with no signs of infection or rash on extremities or on axial skeleton.  Abdomen: Soft nontender  Neuro: Cranial nerves II through XII are intact, neurovascularly intact in all extremities with 2+ DTRs and 2+ pulses.  Lymph: No lymphadenopathy of posterior or anterior cervical  chain or axillae bilaterally.  Gait normal with good balance and coordination.  MSK:  Non tender with full range of motion and good stability and symmetric strength and tone of shoulders, elbows, wrist, hip, and ankles bilaterally.  Knee: Left valgus deformity noted. Large thigh to calf ratio.  Tender to palpation over medial and PF joint line.  ROM full in flexion and extension and lower leg rotation. instability with valgus force.  painful patellar compression. Patellar glide with moderate crepitus. Patellar and quadriceps tendons unremarkable. Hamstring and quadriceps strength is normal. Contralateral knee shows mild arthritic changes   Impression and Recommendations:  This case required medical decision making of moderate complexity.      Note: This dictation was prepared with Dragon dictation along with smaller phrase technology. Any transcriptional errors that result from this process are unintentional.

## 2017-05-17 NOTE — Assessment & Plan Note (Signed)
Possible to have a reaction to the hyaluronic acid versus an infection. - Aspiration today - Fluid was sent for Gram stain and cytology - Mediated sending antibiotics to suggest infection - Counseled on seeking immediate care if her symptoms or develops a fever.

## 2017-05-17 NOTE — Telephone Encounter (Signed)
Called patient and discussed with her that preliminary labs do show that patient likely has an infection. Patient started on doxycycline as well as Cipro. Sent into the pharmacy

## 2017-05-19 ENCOUNTER — Telehealth: Payer: Self-pay | Admitting: *Deleted

## 2017-05-19 ENCOUNTER — Ambulatory Visit (INDEPENDENT_AMBULATORY_CARE_PROVIDER_SITE_OTHER): Payer: BLUE CROSS/BLUE SHIELD | Admitting: Family Medicine

## 2017-05-19 ENCOUNTER — Encounter: Payer: Self-pay | Admitting: Family Medicine

## 2017-05-19 VITALS — BP 140/80 | HR 78 | Ht 68.0 in | Wt 238.0 lb

## 2017-05-19 DIAGNOSIS — M25462 Effusion, left knee: Secondary | ICD-10-CM | POA: Diagnosis not present

## 2017-05-19 DIAGNOSIS — M1712 Unilateral primary osteoarthritis, left knee: Secondary | ICD-10-CM | POA: Diagnosis not present

## 2017-05-19 LAB — SYNOVIAL CELL COUNT + DIFF, W/ CRYSTALS
Basophils, %: 0 %
EOSINOPHILS-SYNOVIAL: 0 % (ref 0–2)
Lymphocytes-Synovial Fld: 7 % (ref 0–74)
MONOCYTE/MACROPHAGE: 13 % (ref 0–69)
Neutrophil, Synovial: 80 % — ABNORMAL HIGH (ref 0–24)
SYNOVIOCYTES, %: 0 % (ref 0–15)
WBC, SYNOVIAL: 20335 {cells}/uL — AB (ref <150)

## 2017-05-19 LAB — PROTHROMBIN GENE MUTATION

## 2017-05-19 LAB — FACTOR 5 LEIDEN

## 2017-05-19 NOTE — Assessment & Plan Note (Signed)
Degenerative arthritis.  Patient is failed all conservative therapy including viscous supplementation. Patient is very young and does not want a knee replacement. Patient recently has undergone gastric bypass surgery and is motivated to get in shape. We'll refer to orthopedic surgery to see if any type of possible arthroscopic procedure could be beneficial. Patient will follow-up with me again as needed. I believe that patient's flare was likely secondary to more of this and is continuing to have the infection etiology has noted on exam.

## 2017-05-19 NOTE — Telephone Encounter (Signed)
Dr Maudie Mercury received notes from [facility] and advised she schedule an office visit to discuss the results of the CT scan.  I left a detailed message at the pts cell number to call the office to schedule an appt.

## 2017-05-20 ENCOUNTER — Telehealth: Payer: Self-pay | Admitting: Family

## 2017-05-20 ENCOUNTER — Other Ambulatory Visit: Payer: Self-pay | Admitting: Family

## 2017-05-20 DIAGNOSIS — D509 Iron deficiency anemia, unspecified: Secondary | ICD-10-CM | POA: Insufficient documentation

## 2017-05-20 DIAGNOSIS — D500 Iron deficiency anemia secondary to blood loss (chronic): Secondary | ICD-10-CM

## 2017-05-20 NOTE — Telephone Encounter (Signed)
I spoke with Ms. [name] about her recent lab work and let her know she was positive for the single prothrombin gene mutation. She verbalized understanding that she will not be able to be on any estrogen therapy. She will also have her 47 yo daughter tested.  Her iron was low as well so we will plan to give her an infusion this week. She agrees with this plan and will schedule with [facility]. All questions were answered.

## 2017-05-22 ENCOUNTER — Ambulatory Visit (HOSPITAL_BASED_OUTPATIENT_CLINIC_OR_DEPARTMENT_OTHER): Payer: BLUE CROSS/BLUE SHIELD

## 2017-05-22 VITALS — BP 143/77 | HR 74 | Temp 97.7°F | Resp 16

## 2017-05-22 DIAGNOSIS — D500 Iron deficiency anemia secondary to blood loss (chronic): Secondary | ICD-10-CM

## 2017-05-22 DIAGNOSIS — D508 Other iron deficiency anemias: Secondary | ICD-10-CM | POA: Diagnosis not present

## 2017-05-22 DIAGNOSIS — N921 Excessive and frequent menstruation with irregular cycle: Secondary | ICD-10-CM

## 2017-05-22 MED ORDER — SODIUM CHLORIDE 0.9 % IV SOLN
510.0000 mg | Freq: Once | INTRAVENOUS | Status: AC
  Administered 2017-05-22: 510 mg via INTRAVENOUS
  Filled 2017-05-22: qty 17

## 2017-05-22 MED ORDER — SODIUM CHLORIDE 0.9 % IV SOLN
Freq: Once | INTRAVENOUS | Status: AC
  Administered 2017-05-22: 15:00:00 via INTRAVENOUS

## 2017-05-22 NOTE — Patient Instructions (Signed)

## 2017-05-26 DIAGNOSIS — R0789 Other chest pain: Secondary | ICD-10-CM | POA: Diagnosis not present

## 2017-05-29 ENCOUNTER — Ambulatory Visit (INDEPENDENT_AMBULATORY_CARE_PROVIDER_SITE_OTHER): Payer: BLUE CROSS/BLUE SHIELD | Admitting: Obstetrics and Gynecology

## 2017-05-29 ENCOUNTER — Encounter: Payer: Self-pay | Admitting: Obstetrics and Gynecology

## 2017-05-29 VITALS — BP 122/76 | HR 70 | Ht 67.0 in | Wt 233.6 lb

## 2017-05-29 DIAGNOSIS — N946 Dysmenorrhea, unspecified: Secondary | ICD-10-CM

## 2017-05-29 DIAGNOSIS — N939 Abnormal uterine and vaginal bleeding, unspecified: Secondary | ICD-10-CM

## 2017-05-29 NOTE — Progress Notes (Signed)
GYNECOLOGY  VISIT   HPI: 46 y.o.   Married  Caucasian  female   [phone] with Patient's last menstrual period was 05/24/2017 (exact date).   here for follow up on anovulatory bleeding. Patient states bled 04-11-17 through 04-14-17 and began again 05-24-17.  Lots of pain with the bleeding.   Hx endometrial ablation.  Has possible adenomyosis, degenerating fibroid or endometrial mass on US/SIS on 08/08/16. EMB benign.   Micronor did not help to reduce bleeding with her cycles.  Did a trial for 3 months.   She did not take Aygestin for cycle control.  Hx DVT in past.  Did consultation with Hematology/Oncology and has new dx single gene prothrombin gene mutation.  Had an iron infusion on 05/22/17.  Had evaluation for atypical chest pain at [facility]. Had a normal stress test and had an ECHO done as well.  GYNECOLOGIC HISTORY: Patient's last menstrual period was 05/24/2017 (exact date). Contraception:  None--female partner Menopausal hormone therapy:  None. Last mammogram:  08/06/16 Diagnostic Bilateral BIRADS2:benign  Last pap smear:   04/29/14 Neg. HR HPV:neg         OB History    Gravida Para Term Preterm AB Living   3 1 1   2 1    SAB TAB Ectopic Multiple Live Births   2                 Patient Active Problem List   Diagnosis Date Noted  . IDA (iron deficiency anemia) 05/20/2017  . Effusion of left knee 05/17/2017  . Degenerative arthritis of right knee 02/13/2017  . Degenerative arthritis of left knee 02/13/2017  . Numbness and tingling of both lower extremities 10/18/2016  . Genetic testing 10/18/2016  . Family history of breast cancer   . Multinodular goiter 04/24/2015  . Mood disorder ([place]) 04/12/2015  . Peroneal mononeuropathy 08/22/2014  . Left foot drop 07/21/2014  . Menorrhagia with irregular cycle 04/29/2014  . S/P gastric bypass 11/02/2013  . Hyperlipidemia 10/08/2013  . Primary localized osteoarthrosis, lower leg 07/06/2013  . Obesity, BMI unknown 04/23/2013   . IUD contraception - inserted summer 2013 04/09/2013    Past Medical History:  Diagnosis Date  . Anxiety and depression   . Complication of anesthesia    has been hard to wake up post-op  . Dental crowns present   . Diabetes ([place])    resolved with gastric bypass  . DVT of lower extremity (deep venous thrombosis) ([place]) 2012   LLE  . Dysmenorrhea    hx endometriosis and fibroid  . Family history of breast cancer   . Headache(784.0)    tension or sinus  . History of MRSA infection prior to 2012   lasted 5-6 yr.  . Jaw snapping    states jaw pops  . Left foot drop 2016   --[place] [facility]  . Neuropathy 2015   hands, feet and face--[place] neurolog[facility]  . Prothrombin gene mutation ([place]) 2018   single gene mutation  . Right knee pain    hx meniscal injury, chondromalacia, OA    Past Surgical History:  Procedure Laterality Date  . CHOLECYSTECTOMY  1990's  . DILITATION & CURRETTAGE/HYSTROSCOPY WITH NOVASURE ABLATION N/A 05/30/2014   Procedure: DILATATION & CURETTAGE/HYSTEROSCOPY WITH attempted [place] and with IUD removal ;  Surgeon: Jamey Reas de Berton Lan, MD;  Location: [facility];  Service: Gynecology;  Laterality: N/A;  . GASTRIC ROUX-EN-Y N/A 11/02/2013   Procedure: LAPAROSCOPIC ROUX-EN-Y GASTRIC BYPASS WITH UPPER ENDOSCOPY;  Surgeon: Gayland Curry, MD;  Location: [facility];  Service: General;  Laterality: N/A;  . HAND SURGERY Left    X 3 - spider bite and MRSA infection  . HYSTEROSCOPY WITH RESECTOSCOPE N/A 05/30/2014   Procedure: HYSTEROSCOPY WITH RESECTOSCOPE;  Surgeon: Jamey Reas de Berton Lan, MD;  Location: [facility];  Service: Gynecology;  Laterality: N/A;  . KNEE ARTHROSCOPY Right 12/29/2012   Procedure: RIGHT KNEE ARTHROSCOPY  PARTIAL MEDIAL AND LATERAL MENISECTOMY WITH CHONDROPLASTY;  Surgeon: Hessie Dibble, MD;  Location: [facility];  Service: Orthopedics;  Laterality: Right;  . PELVIC LAPAROSCOPY  1990's   d/t  endometriosis  . TONSILLECTOMY  as child  . WRIST SURGERY Right 2005    Current Outpatient Prescriptions  Medication Sig Dispense Refill  . Calcium-Vitamin D-Vitamin K (CHEWABLE CALCIUM PO) Take 1 tablet by mouth daily.    . DULoxetine (CYMBALTA) 60 MG capsule Take 120 mg by mouth every morning.     Marland Kitchen LYRICA 25 MG capsule Take 3 times daily    . Melatonin 1 MG SUBL Place 3 mg under the tongue at bedtime. 1 tablet 2  . Multiple Vitamin (MULTIVITAMIN) tablet Take 1 tablet by mouth daily.    . Vitamin D, Ergocalciferol, (DRISDOL) 50000 units CAPS capsule Take 1 capsule (50,000 Units total) by mouth every 7 (seven) days. 12 capsule 0  . norethindrone (AYGESTIN) 5 MG tablet Take 1 tablet (5 mg total) by mouth daily. (Patient not taking: Reported on 05/29/2017) 30 tablet 1   No current facility-administered medications for this visit.      ALLERGIES: Lamotrigine; Sulfa antibiotics; Bactrim [sulfamethoxazole-trimethoprim]; and Ibuprofen  Family History  Problem Relation Age of Onset  . Diabetes Mother        Living  . Kidney disease Mother   . Hypertension Mother   . Breast cancer Mother 64       dx'd with breast ca x2; 2nd around 53  . Diabetes Maternal Grandmother 84       dec  . Breast cancer Maternal Grandmother        dx in her 26s  . Arthritis Father   . Healthy Sister   . Healthy Sister   . Diabetes Maternal Grandfather   . Stroke Maternal Grandfather   . Healthy Daughter   . Breast cancer Other        2 great aunts  . Thyroid disease Neg Hx     Social History   Social History  . Marital status: Married    Spouse name: N/A  . Number of children: N/A  . Years of education: N/A   Occupational History  . Not on file.   Social History Main Topics  . Smoking status: Never Smoker  . Smokeless tobacco: Never Used  . Alcohol use 1.2 - 2.4 oz/week    2 - 4 Glasses of wine per week     Comment: 2-4 glasses of wine a few times per month  . Drug use: No  . Sexual  activity: Yes    Partners: Female    Birth control/ protection: Pill     Comment: Micronor   Other Topics Concern  . 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  PHYSICAL EXAMINATION:    BP 122/76 (BP Location: Right Arm, Patient Position: Sitting, Cuff Size: Large)   Pulse 70   Ht 5\' 7"  (1.702 m)   Wt 233 lb 9.6 oz (106 kg)   LMP 05/24/2017 (Exact Date)   BMI 36.59 kg/m     General appearance: alert, cooperative and appears stated age   ASSESSMENT  Dysmenorrehea.  Abnormal uterine bleeding.  Status post endometrial ablation.  Prothrombin gene mutation - single gene.  Hx DVT. Status post ferritin infusion. 22.1% lifetime risk of breast cancer. Also has ATN VUS mutation.   PLAN  Return for pelvic ultrasound.  Discussed options for care - attempted hysteroscopy, Reeves For[place], Orilissa, Depo Lupron, laparoscopic hysterectomy with bilateral salpingectomy and possible bilateral oophorectomy if ovaries abnormal or has significant endometriosis. I discussed  I reviewed  Risks, benefits, and alternatives.  Risks include but are not limited to bleeding, infection, damage to surrounding organs, pneumonia, reaction to anesthesia, DVT, PE, death, need for reoperation, hernia formation,  need to convert to a traditional laparotomy incision to complete the procedure, and menopausal symptoms/decreased libido if ovaries are removed.  Menopausal symptoms can be treated with nonhormonal medication. We discussed Lovenox for DVT/PE prophylaxis for hysterectomy.    An After Visit Summary was printed and given to the patient.  __25____ minutes face to face time of which over 50% was spent in counseling.

## 2017-06-04 DIAGNOSIS — M1712 Unilateral primary osteoarthritis, left knee: Secondary | ICD-10-CM | POA: Diagnosis not present

## 2017-06-04 DIAGNOSIS — M25562 Pain in left knee: Secondary | ICD-10-CM | POA: Diagnosis not present

## 2017-06-05 ENCOUNTER — Encounter: Payer: Self-pay | Admitting: Obstetrics and Gynecology

## 2017-06-05 ENCOUNTER — Ambulatory Visit (INDEPENDENT_AMBULATORY_CARE_PROVIDER_SITE_OTHER): Payer: BLUE CROSS/BLUE SHIELD

## 2017-06-05 ENCOUNTER — Ambulatory Visit (INDEPENDENT_AMBULATORY_CARE_PROVIDER_SITE_OTHER): Payer: BLUE CROSS/BLUE SHIELD | Admitting: Obstetrics and Gynecology

## 2017-06-05 VITALS — BP 130/82 | HR 66 | Ht 67.5 in | Wt 233.0 lb

## 2017-06-05 DIAGNOSIS — N946 Dysmenorrhea, unspecified: Secondary | ICD-10-CM

## 2017-06-05 DIAGNOSIS — N926 Irregular menstruation, unspecified: Secondary | ICD-10-CM

## 2017-06-05 DIAGNOSIS — D219 Benign neoplasm of connective and other soft tissue, unspecified: Secondary | ICD-10-CM

## 2017-06-05 DIAGNOSIS — N939 Abnormal uterine and vaginal bleeding, unspecified: Secondary | ICD-10-CM | POA: Diagnosis not present

## 2017-06-05 NOTE — Patient Instructions (Signed)
Options for care for treatment of bleeding and pain - Aygestin 5 mg daily, Orilissa for up to 2 years to treat pelvic pain and potential endometriosis, Depo Lupron injections for 6 months, or hysterectomy.

## 2017-06-05 NOTE — Progress Notes (Signed)
Patient ID: [name], female   DOB: March 13, 1971, 46 y.o.   MRN: [id number] GYNECOLOGY  VISIT   HPI: 46 y.o.   Married  Caucasian  female   [phone] with Patient's last menstrual period was 05/24/2017 (exact date).   here for pelvic ultrasound for dysmenorrhea and abnormal uterine bleeding.    Did a ferritin infusion for anemia.  Had a long cycle almost 2 weeks.  She does skip her cycles at times as well.  ROS - gastrointestinal pain and bloating, headache.   GYNECOLOGIC HISTORY: Patient's last menstrual period was 05/24/2017 (exact date). Contraception:  None--female partner Menopausal hormone therapy:  none Last mammogram: 08/06/16 Diagnostic Bilateral BIRADS2:benign  Last pap smear:  04-29-14 Neg:neg HR HPV, 04-23-13 Neg        OB History    Gravida Para Term Preterm AB Living   3 1 1   2 1    SAB TAB Ectopic Multiple Live Births   2                 Patient Active Problem List   Diagnosis Date Noted  . IDA (iron deficiency anemia) 05/20/2017  . Effusion of left knee 05/17/2017  . Degenerative arthritis of right knee 02/13/2017  . Degenerative arthritis of left knee 02/13/2017  . Numbness and tingling of both lower extremities 10/18/2016  . Genetic testing 10/18/2016  . Family history of breast cancer   . Multinodular goiter 04/24/2015  . Mood disorder ([place]) 04/12/2015  . Peroneal mononeuropathy 08/22/2014  . Left foot drop 07/21/2014  . Menorrhagia with irregular cycle 04/29/2014  . S/P gastric bypass 11/02/2013  . Hyperlipidemia 10/08/2013  . Primary localized osteoarthrosis, lower leg 07/06/2013  . Obesity, BMI unknown 04/23/2013  . IUD contraception - inserted summer 2013 04/09/2013    Past Medical History:  Diagnosis Date  . Anxiety and depression   . Complication of anesthesia    has been hard to wake up post-op  . Dental crowns present   . Diabetes ([place])    resolved with gastric bypass  . DVT of lower extremity (deep venous thrombosis) ([place]) 2012    LLE  . Dysmenorrhea    hx endometriosis and fibroid  . Family history of breast cancer   . Headache(784.0)    tension or sinus  . History of MRSA infection prior to 2012   lasted 5-6 yr.  . Jaw snapping    states jaw pops  . Left foot drop 2016   --[place] [facility]  . Neuropathy 2015   hands, feet and face--[place] [facility]  . Prothrombin gene mutation ([place]) 2018   single gene mutation  . Right knee pain    hx meniscal injury, chondromalacia, OA    Past Surgical History:  Procedure Laterality Date  . CHOLECYSTECTOMY  1990's  . DILITATION & CURRETTAGE/HYSTROSCOPY WITH NOVASURE ABLATION N/A 05/30/2014   Procedure: DILATATION & CURETTAGE/HYSTEROSCOPY WITH attempted [place] and with IUD removal ;  Surgeon: Jamey Reas de Berton Lan, MD;  Location: [facility];  Service: Gynecology;  Laterality: N/A;  . GASTRIC ROUX-EN-Y N/A 11/02/2013   Procedure: LAPAROSCOPIC ROUX-EN-Y GASTRIC BYPASS WITH UPPER ENDOSCOPY;  Surgeon: Gayland Curry, MD;  [facility];  Service: General;  Laterality: N/A;  . HAND SURGERY Left    X 3 - spider bite and MRSA infection  . HYSTEROSCOPY WITH RESECTOSCOPE N/A 05/30/2014   Procedure: HYSTEROSCOPY WITH RESECTOSCOPE;  Surgeon: Jamey Reas de Berton Lan, MD;  Location: [facility];  Service: Gynecology;  Laterality: N/A;  . KNEE ARTHROSCOPY Right 12/29/2012   Procedure: RIGHT KNEE ARTHROSCOPY  PARTIAL MEDIAL AND LATERAL MENISECTOMY WITH CHONDROPLASTY;  Surgeon: Hessie Dibble, MD;  Location: [facility];  Service: Orthopedics;  Laterality: Right;  . PELVIC LAPAROSCOPY  1990's   d/t endometriosis  . TONSILLECTOMY  as child  . WRIST SURGERY Right 2005    Current Outpatient Prescriptions  Medication Sig Dispense Refill  . Calcium-Vitamin D-Vitamin K (CHEWABLE CALCIUM PO) Take 1 tablet by mouth daily.    . DULoxetine (CYMBALTA) 60 MG capsule Take 120 mg by mouth every morning.     Marland Kitchen LYRICA 25 MG capsule Take 3 times  daily    . Melatonin 1 MG SUBL Place 3 mg under the tongue at bedtime. 1 tablet 2  . Multiple Vitamin (MULTIVITAMIN) tablet Take 1 tablet by mouth daily.    . norethindrone (AYGESTIN) 5 MG tablet Take 1 tablet (5 mg total) by mouth daily. 30 tablet 1  . Vitamin D, Ergocalciferol, (DRISDOL) 50000 units CAPS capsule Take 1 capsule (50,000 Units total) by mouth every 7 (seven) days. 12 capsule 0   No current facility-administered medications for this visit.      ALLERGIES: Lamotrigine; Sulfa antibiotics; Bactrim [sulfamethoxazole-trimethoprim]; and Ibuprofen  Family History  Problem Relation Age of Onset  . Diabetes Mother        Living  . Kidney disease Mother   . Hypertension Mother   . Breast cancer Mother 87       dx'd with breast ca x2; 2nd around 63  . Diabetes Maternal Grandmother 53       dec  . Breast cancer Maternal Grandmother        dx in her 84s  . Arthritis Father   . Healthy Sister   . Healthy Sister   . Diabetes Maternal Grandfather   . Stroke Maternal Grandfather   . Healthy Daughter   . Breast cancer Other        2 great aunts  . Thyroid disease Neg Hx     Social History   Social History  . Marital status: Married    Spouse name: N/A  . Number of children: N/A  . Years of education: N/A   Occupational History  . Not on file.   Social History Main Topics  . Smoking status: Never Smoker  . Smokeless tobacco: Never Used  . Alcohol use 1.2 - 2.4 oz/week    2 - 4 Glasses of wine per week     Comment: 2-4 glasses of wine a few times per month  . Drug use: No  . Sexual activity: Yes    Partners: Female    Birth control/ protection: Pill     Comment: Micronor   Other Topics Concern  . 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  PHYSICAL EXAMINATION:    BP 130/82 (BP Location: Right Arm, Patient Position: Sitting, Cuff Size: Large)   Pulse 66   Ht 5' 7.5" (1.715  m)   Wt 233 lb (105.7 kg)   LMP 05/24/2017 (Exact Date)   BMI 35.95 kg/m     General appearance: alert, cooperative and appears stated age  Pelvic ultrasound  Uterus with 2 fibroids - largest is 2 cm.  EMS no masses. EMS 5.44 mm. Ovaries with follicles.  No free fluid.   ASSESSMENT  Dysmenorrhea.  Irregular menses.  I think this is anovulatory bleeding.  Status post endometrial ablation.  Prothrombin gene mutation.  Hx DVT.  PLAN  We discussed her clinical picture and the possibility for endometriosis or post ablation syndrome.  Options for care are observation, Aygestin 5 mg daily, Depo Lupron x 6 months, Orilissa for 2 years, or hysterectomy.  She wants to monitor her cycles and pain and report back at her annual exam in December.  She states she has some other medication needs to attend to as well such as her knee.    An After Visit Summary was printed and given to the patient.  __15____ minutes face to face time of which over 50% was spent in counseling.

## 2017-06-16 DIAGNOSIS — M1712 Unilateral primary osteoarthritis, left knee: Secondary | ICD-10-CM | POA: Diagnosis not present

## 2017-06-19 ENCOUNTER — Encounter: Payer: Self-pay | Admitting: Family

## 2017-06-19 ENCOUNTER — Other Ambulatory Visit: Payer: Self-pay | Admitting: Family

## 2017-07-04 ENCOUNTER — Ambulatory Visit: Payer: BLUE CROSS/BLUE SHIELD | Admitting: Family Medicine

## 2017-07-04 ENCOUNTER — Encounter: Payer: Self-pay | Admitting: Family Medicine

## 2017-07-04 VITALS — BP 136/68 | HR 80 | Temp 98.4°F | Ht 67.5 in | Wt 238.0 lb

## 2017-07-04 DIAGNOSIS — J0100 Acute maxillary sinusitis, unspecified: Secondary | ICD-10-CM | POA: Diagnosis not present

## 2017-07-04 MED ORDER — AMOXICILLIN-POT CLAVULANATE 875-125 MG PO TABS: 1.0000 | ORAL_TABLET | Freq: Two times a day (BID) | ORAL | 0 refills | Status: DC

## 2017-07-04 NOTE — Progress Notes (Signed)
Chief Complaint  Patient presents with  . Sinusitis  . Headache    [name] here for URI complaints.  Duration: 1 week  Associated symptoms: subj fevers, sinus pain, congestion, ear pain b/l, ST  Denies: rhinorrhea, itchy watery eyes, ear drainage, shortness of breath, myalgia and rigors Treatment to date: Tylenol, Zinc, Vit C Sick contacts: No  ROS:  Const: Denies rigors HEENT: As noted in HPI  Lungs: No SOB  Past Medical History:  Diagnosis Date  . Anxiety and depression   . Complication of anesthesia    has been hard to wake up post-op  . Dental crowns present   . Diabetes ([place])    resolved with gastric bypass  . DVT of lower extremity (deep venous thrombosis) ([place]) 2012   LLE  . Dysmenorrhea    hx endometriosis and fibroid  . Family history of breast cancer   . Headache(784.0)    tension or sinus  . History of MRSA infection prior to 2012   lasted 5-6 yr.  . Jaw snapping    states jaw pops  . Left foot drop 2016   --[place] [facility]  . Neuropathy 2015   hands, feet and face--[place] [facility]  . Prothrombin gene mutation ([place]) 2018   single gene mutation  . Right knee pain    hx meniscal injury, chondromalacia, OA   BP 136/68   Pulse 80   Temp 98.4 F (36.9 C) (Oral)   Ht 5' 7.5" (1.715 m)   Wt 238 lb (108 kg)   SpO2 97%   BMI 36.73 kg/m  General: Awake, alert, appears stated age 46: AT, [place], ears patent b/l and TM's neg, Max sinuses TTP, nares patent w/o discharge, pharynx pink and without exudates, MMM Neck: No masses or asymmetry Heart: RRR, no murmurs, no bruits Lungs: CTAB, no accessory muscle use Psych: Age appropriate judgment and insight, normal mood and affect  Acute non-recurrent maxillary sinusitis  Pocket Rx, instructions to take given in AVS.   Continue to push fluids, practice good hand hygiene, cover mouth when coughing. F/u prn. If starting to experience fevers, shaking, or shortness of breath, seek immediate care. Pt  voiced understanding and agreement to the plan.  [place], DO 07/04/17 10:40 AM

## 2017-07-04 NOTE — Patient Instructions (Addendum)
Most sinus infections are viral in etiology and antibiotics will not be helpful. That being said, if you start having worsening symptoms over 3 days, you are worsening by day 10 or not improving by day 14, go ahead and take it. You are on Day 7 as of now.   Continue to push fluids, practice good hand hygiene, and cover your mouth if you cough.  If you start having fevers, shaking or shortness of breath, seek immediate care.  Let us know if you need anything.

## 2017-07-04 NOTE — Progress Notes (Signed)
Pre visit review using our clinic review tool, if applicable. No additional management support is needed unless otherwise documented below in the visit note. 

## 2017-07-09 DIAGNOSIS — Z713 Dietary counseling and surveillance: Secondary | ICD-10-CM | POA: Diagnosis not present

## 2017-07-14 NOTE — Pre-Procedure Instructions (Signed)
Cammi Consalvo  07/14/2017      [facility] - [place], [place] - [address] [place] [address] Phone: [phone] Fax: [phone]  [name] [place] [address] [place] [address] Phone: [phone] Fax: [phone]    Your procedure is scheduled on Tues. Dec. 11  Report to [facility] Admitting at 5:30 A.M.  Call this number if you have problems the morning of surgery:  [phone]   Remember:  Do not eat food or drink liquids after midnight on Mon. Dec.10   Take these medicines the morning of surgery with A SIP OF WATER : tylenol if needed, duloxetine (cymbalta), lyrica,  7 days prior to surgery STOP taking any Aspirin(unless otherwise instructed by your surgeon), Aleve, Naproxen, Ibuprofen, Motrin, Advil, Goody's, BC's, all herbal medications, fish oil, and all vitamins   Do not wear jewelry, make-up or nail polish.  Do not wear lotions, powders, or perfumes, or deoderant.  Do not shave 48 hours prior to surgery.  Men may shave face and neck.  Do not bring valuables to the hospital.  [facility] is not responsible for any belongings or valuables.  Contacts, dentures or bridgework may not be worn into surgery.  Leave your suitcase in the car.  After surgery it may be brought to your room.  For patients admitted to the hospital, discharge time will be determined by your treatment team.  Patients discharged the day of surgery will not be allowed to drive home.    Special instructions:  [place]- Preparing For Surgery  Before surgery, you can play an important role. Because skin is not sterile, your skin needs to be as free of germs as possible. You can reduce the number of germs on your skin by washing with CHG (chlorahexidine gluconate) Soap before surgery.  CHG is an antiseptic cleaner which kills germs and bonds with the skin to continue killing  germs even after washing.  Please do not use if you have an allergy to CHG or antibacterial soaps. If your skin becomes reddened/irritated stop using the CHG.  Do not shave (including legs and underarms) for at least 48 hours prior to first CHG shower. It is OK to shave your face.  Please follow these instructions carefully.   1. Shower the NIGHT BEFORE SURGERY and the MORNING OF SURGERY with CHG.   2. If you chose to wash your hair, wash your hair first as usual with your normal shampoo.  3. After you shampoo, rinse your hair and body thoroughly to remove the shampoo.  4. Use CHG as you would any other liquid soap. You can apply CHG directly to the skin and wash gently with a scrungie or a clean washcloth.   5. Apply the CHG Soap to your body ONLY FROM THE NECK DOWN.  Do not use on open wounds or open sores. Avoid contact with your eyes, ears, mouth and genitals (private parts). Wash Face and genitals (private parts)  with your normal soap.  6. Wash thoroughly, paying special attention to the area where your surgery will be performed.  7. Thoroughly rinse your body with warm water from the neck down.  8. DO NOT shower/wash with your normal soap after using and rinsing off the CHG Soap.  9. Pat yourself dry with a CLEAN TOWEL.  10. Wear CLEAN PAJAMAS to bed the night before surgery, wear comfortable clothes  the morning of surgery  11. Place CLEAN SHEETS on your bed the night of your first shower and DO NOT SLEEP WITH PETS.    Day of Surgery: Do not apply any deodorants/lotions. Please wear clean clothes to the hospital/surgery center.      Please read over the following fact sheets that you were given. Coughing and Deep Breathing, Total Joint Packet, MRSA Information and Surgical Site Infection Prevention

## 2017-07-15 ENCOUNTER — Ambulatory Visit (HOSPITAL_COMMUNITY)
Admission: RE | Admit: 2017-07-15 | Discharge: 2017-07-15 | Disposition: A | Discharge status: Home / Self Care | Payer: BLUE CROSS/BLUE SHIELD | Source: Ambulatory Visit | Attending: Orthopaedic Surgery | Admitting: Orthopaedic Surgery

## 2017-07-15 ENCOUNTER — Encounter (HOSPITAL_COMMUNITY)
Admission: RE | Admit: 2017-07-15 | Discharge: 2017-07-15 | Disposition: A | Discharge status: Home / Self Care | Payer: BLUE CROSS/BLUE SHIELD | Source: Ambulatory Visit | Attending: Orthopaedic Surgery | Admitting: Orthopaedic Surgery

## 2017-07-15 ENCOUNTER — Encounter (HOSPITAL_COMMUNITY): Payer: Self-pay

## 2017-07-15 ENCOUNTER — Other Ambulatory Visit: Payer: Self-pay

## 2017-07-15 DIAGNOSIS — Z01818 Encounter for other preprocedural examination: Secondary | ICD-10-CM | POA: Insufficient documentation

## 2017-07-15 DIAGNOSIS — Z01812 Encounter for preprocedural laboratory examination: Secondary | ICD-10-CM | POA: Diagnosis not present

## 2017-07-15 DIAGNOSIS — M1712 Unilateral primary osteoarthritis, left knee: Secondary | ICD-10-CM | POA: Insufficient documentation

## 2017-07-15 HISTORY — DX: Myoneural disorder, unspecified: G70.9

## 2017-07-15 HISTORY — DX: Nontoxic single thyroid nodule: E04.1

## 2017-07-15 LAB — CBC WITH DIFFERENTIAL/PLATELET
BASOS ABS: 0.1 10*3/uL (ref 0.0–0.1)
BASOS PCT: 1 %
EOS ABS: 0.6 10*3/uL (ref 0.0–0.7)
EOS PCT: 7 %
HCT: 42.2 % (ref 36.0–46.0)
Hemoglobin: 14 g/dL (ref 12.0–15.0)
Lymphocytes Relative: 30 %
Lymphs Abs: 2.5 10*3/uL (ref 0.7–4.0)
MCH: 31 pg (ref 26.0–34.0)
MCHC: 33.2 g/dL (ref 30.0–36.0)
MCV: 93.6 fL (ref 78.0–100.0)
Monocytes Absolute: 0.5 10*3/uL (ref 0.1–1.0)
Monocytes Relative: 6 %
Neutro Abs: 4.7 10*3/uL (ref 1.7–7.7)
Neutrophils Relative %: 56 %
PLATELETS: 342 10*3/uL (ref 150–400)
RBC: 4.51 MIL/uL (ref 3.87–5.11)
RDW: 13.6 % (ref 11.5–15.5)
WBC: 8.3 10*3/uL (ref 4.0–10.5)

## 2017-07-15 LAB — PROTIME-INR
INR: 0.93
PROTHROMBIN TIME: 12.4 s (ref 11.4–15.2)

## 2017-07-15 LAB — URINALYSIS, ROUTINE W REFLEX MICROSCOPIC
BILIRUBIN URINE: NEGATIVE
Glucose, UA: NEGATIVE mg/dL
Hgb urine dipstick: NEGATIVE
Ketones, ur: NEGATIVE mg/dL
Leukocytes, UA: NEGATIVE
NITRITE: NEGATIVE
PH: 6 (ref 5.0–8.0)
Protein, ur: NEGATIVE mg/dL
SPECIFIC GRAVITY, URINE: 1.016 (ref 1.005–1.030)

## 2017-07-15 LAB — TYPE AND SCREEN
ABO/RH(D): A POS
ANTIBODY SCREEN: NEGATIVE

## 2017-07-15 LAB — ABO/RH: ABO/RH(D): A POS

## 2017-07-15 LAB — BASIC METABOLIC PANEL
Anion gap: 7 (ref 5–15)
BUN: 10 mg/dL (ref 6–20)
CALCIUM: 9 mg/dL (ref 8.9–10.3)
CO2: 28 mmol/L (ref 22–32)
CREATININE: 0.76 mg/dL (ref 0.44–1.00)
Chloride: 103 mmol/L (ref 101–111)
GFR calc Af Amer: >60 mL/min (ref >60)
Glucose, Bld: 109 mg/dL — ABNORMAL HIGH (ref 65–99)
Potassium: 3.9 mmol/L (ref 3.5–5.1)
SODIUM: 138 mmol/L (ref 135–145)

## 2017-07-15 LAB — SURGICAL PCR SCREEN
MRSA, PCR: NEGATIVE
STAPHYLOCOCCUS AUREUS: NEGATIVE

## 2017-07-15 LAB — APTT: APTT: 31 s (ref 24–36)

## 2017-07-15 LAB — HCG, SERUM, QUALITATIVE: Preg, Serum: NEGATIVE

## 2017-07-15 NOTE — Progress Notes (Signed)
PCP: Dr. Rodena Goldmann @ [facility]  Cardiologist: Dr. Stan Head @ [facility]---as needed Hematologist: Dr. Mariel Sleet

## 2017-07-15 NOTE — H&P (Signed)
TOTAL KNEE ADMISSION H&P  Patient is being admitted for left total knee arthroplasty.  Subjective:  Chief Complaint:left knee pain.  HPI: [name], 46 y.o. female, has a history of pain and functional disability in the left knee due to arthritis and has failed non-surgical conservative treatments for greater than 12 weeks to includeNSAID's and/or analgesics, corticosteriod injections, viscosupplementation injections, flexibility and strengthening excercises, use of assistive devices, weight reduction as appropriate and activity modification.  Onset of symptoms was gradual, starting 5 years ago with gradually worsening course since that time. The patient noted no past surgery on the left knee(s).  Patient currently rates pain in the left knee(s) at 10 out of 10 with activity. Patient has night pain, worsening of pain with activity and weight bearing, pain that interferes with activities of daily living, crepitus and joint swelling.  Patient has evidence of subchondral cysts, subchondral sclerosis, periarticular osteophytes and joint space narrowing by imaging studies. There is no active infection.  Patient Active Problem List   Diagnosis Date Noted  . IDA (iron deficiency anemia) 05/20/2017  . Effusion of left knee 05/17/2017  . Degenerative arthritis of right knee 02/13/2017  . Degenerative arthritis of left knee 02/13/2017  . Numbness and tingling of both lower extremities 10/18/2016  . Genetic testing 10/18/2016  . Family history of breast cancer   . Multinodular goiter 04/24/2015  . Mood disorder ([place]) 04/12/2015  . Peroneal mononeuropathy 08/22/2014  . Left foot drop 07/21/2014  . Menorrhagia with irregular cycle 04/29/2014  . S/P gastric bypass 11/02/2013  . Hyperlipidemia 10/08/2013  . Primary localized osteoarthrosis, lower leg 07/06/2013  . Obesity, BMI unknown 04/23/2013  . IUD contraception - inserted summer 2013 04/09/2013   Past Medical History:  Diagnosis Date  .  Anxiety and depression   . Arthritis   . Complication of anesthesia    has been hard to wake up post-op  . Dental crowns present   . Depression   . Diabetes ([place])    resolved with gastric bypass  . DVT of lower extremity (deep venous thrombosis) ([place]) 2012   LLE  . Dysmenorrhea    hx endometriosis and fibroid  . Family history of breast cancer   . Headache(784.0)    tension or sinus  . History of MRSA infection prior to 2012   lasted 5-6 yr.  . Jaw snapping    states jaw pops  . Left foot drop 2016   --[facility]  . Neuromuscular disorder ([place])    neuropathy in hands,feet,face  . Neuropathy 2015   hands, feet and face--[place]  . Prothrombin gene mutation ([place]) 2018   single gene mutation  . Right knee pain    hx meniscal injury, chondromalacia, OA  . Thyroid nodule     Past Surgical History:  Procedure Laterality Date  . CHOLECYSTECTOMY  1990's  . DILITATION & CURRETTAGE/HYSTROSCOPY WITH NOVASURE ABLATION N/A 05/30/2014   Procedure: DILATATION & CURETTAGE/HYSTEROSCOPY WITH attempted [place] and with IUD removal ;  Surgeon: Jamey Reas de Berton Lan, MD;  Location: [facility];  Service: Gynecology;  Laterality: N/A;  . GASTRIC ROUX-EN-Y N/A 11/02/2013   Procedure: LAPAROSCOPIC ROUX-EN-Y GASTRIC BYPASS WITH UPPER ENDOSCOPY;  Surgeon: Gayland Curry, MD;  Location: [facility];  Service: General;  Laterality: N/A;  . HAND SURGERY Left    X 3 - spider bite and MRSA infection  . HYSTEROSCOPY WITH RESECTOSCOPE N/A 05/30/2014   Procedure: HYSTEROSCOPY WITH RESECTOSCOPE;  Surgeon: Jamey Reas  de Berton Lan, MD;  Location: [facility];  Service: Gynecology;  Laterality: N/A;  . KNEE ARTHROSCOPY Right 12/29/2012   Procedure: RIGHT KNEE ARTHROSCOPY  PARTIAL MEDIAL AND LATERAL MENISECTOMY WITH CHONDROPLASTY;  Surgeon: Hessie Dibble, MD;  Location: [facility];  Service: Orthopedics;  Laterality: Right;  . PELVIC LAPAROSCOPY  1990's    d/t endometriosis  . TONSILLECTOMY  as child  . WRIST SURGERY Right 2005    No current facility-administered medications for this encounter.    Current Outpatient Medications  Medication Sig Dispense Refill Last Dose  . Acetaminophen 167 MG/5ML LIQD Take by mouth every 8 (eight) hours as needed (pain).     Marland Kitchen amoxicillin-clavulanate (AUGMENTIN) 875-125 MG tablet Take 1 tablet by mouth 2 (two) times daily. (Patient taking differently: Take 1 tablet by mouth 2 (two) times daily. 7 day course started 07-05-17) 20 tablet 0 finished  . Calcium Carb-Cholecalciferol (CALCIUM 600 + D PO) Take 1 tablet by mouth 2 (two) times daily.     . DULoxetine (CYMBALTA) 60 MG capsule Take 120 mg by mouth every morning.    Taking  . LYRICA 25 MG capsule Take 25 mg by mouth 3 (three) times daily. Take 3 times daily   Taking  . Multiple Vitamin (MULTIVITAMIN) tablet Take 1 tablet by mouth 2 (two) times daily.    Taking  . Vitamin D, Ergocalciferol, (DRISDOL) 50000 units CAPS capsule Take 1 capsule (50,000 Units total) by mouth every 7 (seven) days. (Patient taking differently: Take 50,000 Units by mouth every 7 (seven) days. Sundays) 12 capsule 0 Taking  . Melatonin 1 MG SUBL Place 3 mg under the tongue at bedtime. (Patient not taking: Reported on 07/08/2017) 1 tablet 2 Not Taking at Unknown time  . norethindrone (AYGESTIN) 5 MG tablet Take 1 tablet (5 mg total) by mouth daily. (Patient not taking: Reported on 07/08/2017) 30 tablet 1 Not Taking at Unknown time   Allergies  Allergen Reactions  . Lamotrigine Hives  . Sulfa Antibiotics Other (See Comments)    HALLUCINATIONS, FEVER, CHILLS HALLUCINATIONS, FEVER, CHILLS  . Bactrim [Sulfamethoxazole-Trimethoprim] Other (See Comments)    Hallucinations, fever chills  . Ibuprofen Other (See Comments)    Had gastric bypass. Told not to take NSAIDs.    Social History   Tobacco Use  . Smoking status: Never Smoker  . Smokeless tobacco: Never Used  Substance Use Topics   . Alcohol use: Yes    Alcohol/week: 1.2 - 2.4 oz    Types: 2 - 4 Glasses of wine per week    Comment: 2-4 glasses of wine a few times per month    Family History  Problem Relation Age of Onset  . Diabetes Mother        Living  . Kidney disease Mother   . Hypertension Mother   . Breast cancer Mother 17       dx'd with breast ca x2; 2nd around 1  . Diabetes Maternal Grandmother 33       dec  . Breast cancer Maternal Grandmother        dx in her 82s  . Arthritis Father   . Healthy Sister   . Healthy Sister   . Diabetes Maternal Grandfather   . Stroke Maternal Grandfather   . Healthy Daughter   . Breast cancer Other        2 great aunts  . Thyroid disease Neg Hx      Review of Systems  Musculoskeletal: Positive  for joint pain.       Left knee  All other systems reviewed and are negative.   Objective:  Physical Exam  Constitutional: She is oriented to person, place, and time. She appears well-developed and well-nourished.  HENT:  Head: Normocephalic and atraumatic.  Eyes: Pupils are equal, round, and reactive to light.  Neck: Normal range of motion.  Cardiovascular: Normal rate and regular rhythm.  Respiratory: Effort normal.  GI: Soft.  Musculoskeletal:  Examination left knee shows range of motion 5 0-120 of flexion.  She has tenderness palpation along the joint line.  1+ crepitation.  No significant effusion.  Her ligaments are stable.  Her calf is soft and nontender.  She is neurovascularly intact distally.  Hip range of motion on the left is full and without pain.   Neurological: She is alert and oriented to person, place, and time.  Skin: Skin is warm and dry.  Psychiatric: She has a normal mood and affect. Her behavior is normal. Judgment and thought content normal.    Vital signs in last 24 hours: Temp:  [98.2 F (36.8 C)] 98.2 F (36.8 C) (12/04 0811) Pulse Rate:  [71] 71 (12/04 0811) Resp:  [20] 20 (12/04 0811) BP: (142)/(86) 142/86 (12/04  0811) SpO2:  [100 %] 100 % (12/04 0811) Weight:  [108 kg (238 lb 1.6 oz)] 108 kg (238 lb 1.6 oz) (12/04 0811)  Labs:   Estimated body mass index is 36.2 kg/m as calculated from the following:   Height as of 07/15/17: 5\' 8"  (1.727 m).   Weight as of 07/15/17: 108 kg (238 lb 1.6 oz).   Imaging Review Plain radiographs demonstrate severe degenerative joint disease of the left knee(s). The overall alignment isneutral. The bone quality appears to be good for age and reported activity level.  Assessment/Plan:  End stage primary arthritis, left knee   The patient history, physical examination, clinical judgment of the provider and imaging studies are consistent with end stage degenerative joint disease of the left knee(s) and total knee arthroplasty is deemed medically necessary. The treatment options including medical management, injection therapy arthroscopy and arthroplasty were discussed at length. The risks and benefits of total knee arthroplasty were presented and reviewed. The risks due to aseptic loosening, infection, stiffness, patella tracking problems, thromboembolic complications and other imponderables were discussed. The patient acknowledged the explanation, agreed to proceed with the plan and consent was signed. Patient is being admitted for inpatient treatment for surgery, pain control, PT, OT, prophylactic antibiotics, VTE prophylaxis, progressive ambulation and ADL's and discharge planning. The patient is planning to be discharged home with home health services

## 2017-07-17 NOTE — Progress Notes (Signed)
Anesthesia Chart Review: Patient is a 46 year old female scheduled for left TKA on 07/22/17 by Dr. Melrose Nakayama.  History includes never smoker, left lower extremity DVT 2012 (while on OCT), prothrombin gene mutation (single gene), neuropathy, diabetes mellitus type 2 (resolved following gastric Roux-en-Y 11/02/13), headaches, anxiety, depression, arthritis, thyroid nodule, left foot drop, TMJ, MRSA 2012, endometriosis, dental crowns, cholecystectomy, tonsillectomy. BMI is consistent with obesity.  - PCP is Dr. Colin Benton. - Cardiologist is Dr. Stan Head ([place]). She was recently evaluated for chest pain and a stress echo was ordered which was normal. - Hematologist is Dr. Burney Gauze. Last visit with Laverna Peace, NP 05/14/17.  Meds include Cymbalta, Lyrica, vitamin E.  BP (!) 142/86   Pulse 71   Temp 36.8 C   Resp 20   Ht 5\' 8"  (1.727 m)   Wt 238 lb 1.6 oz (108 kg)   LMP 06/20/2017   SpO2 100%   BMI 36.20 kg/m   EKG 05/15/17 ([facility]): TRACING REQUESTED, but is still pending. Result Narrative: Normal sinus rhythm.  Stress Echo 05/26/17 ([place] [facility]): STRESS ECG RESULTS -----------------------------------------------------------  ECG Results: Normal. .  INTERPRETATION ---------------------------------------------------------------  NORMAL STRESS TEST.  NORMAL LA PRESSURES WITH NORMAL DIASTOLIC FUNCTION  VALVULAR REGURGITATION: TRIVIAL MR  NO VALVULAR STENOSIS  NORMAL RESTING BP - APPROPRIATE RESPONSE  Echo 03/20/15: Study Conclusions - Left ventricle: The cavity size was normal. Systolic function was   vigorous. The estimated ejection fraction was in the range of 65%   to 70%. Wall motion was normal; there were no regional wall   motion abnormalities. Left ventricular diastolic function   parameters were normal. - Aortic valve: Trileaflet; normal thickness leaflets. There was no   regurgitation. - Aortic root: The  aortic root was normal in size. - Left atrium: The atrium was normal in size. - Right ventricle: Systolic function was normal. - Right atrium: The atrium was normal in size. - Tricuspid valve: There was trivial regurgitation. - Pulmonary arteries: Systolic pressure was within the normal   range. - Inferior vena cava: The vessel was normal in size. The   respirophasic diameter changes were in the normal range (= 50%),   consistent with normal central venous pressure. - Pericardium, extracardiac: There was no pericardial effusion.  Carotid U/S 04/03/15: Impressions: Normal carotid arteries, bilaterally. Normal subclavian arteries, bilaterally. Patent vertebral arteries with antegrade flow.  Chest CT 05/10/17 ([facility]): Impression:  1. No findings to explain left sided chest pain. 2. Bilateral thyroid nodules measuring up to 1.3 cm, consider further evaluation with thyroid ultrasound.  CXR 07/15/17: IMPRESSION: No acute abnormality .  Polysomnography 06/21/16: IMPRESSION: PLM disorder with arousals, correlating with clinical  history of RLS. No OSA.   Preoperative labs noted. Cr 0.76. Glucose 109. CBC WNL.PT/PTT WNL. Serum pregnancy test negative. UA WNL.   If no acute changes then I anticipate that she can proceed as planned.  George Hugh [facility] Short Stay Center/Anesthesiology Phone [phone] 07/17/2017 12:45 PM

## 2017-07-18 ENCOUNTER — Other Ambulatory Visit: Payer: Self-pay

## 2017-07-18 ENCOUNTER — Telehealth: Payer: Self-pay

## 2017-07-18 MED ORDER — RIVAROXABAN 10 MG PO TABS: 10.0000 mg | ORAL_TABLET | Freq: Every day | ORAL | 0 refills | Status: DC

## 2017-07-18 NOTE — Telephone Encounter (Signed)
Received fax for surgical clearance for patient from Dr [facility] office. Per Dr Marin Olp, from an oncology standpoint, pt is cleared for left TKA on 07/22/17. She will need to take Xarelto 10mg  daily x 3 weeks. This prescription has been sent to [name] at [facility].  This information signed by Dr Marin Olp and faxed back to Dr [facility] office at [phone]. dph

## 2017-07-21 MED ORDER — TRANEXAMIC ACID 1000 MG/10ML IV SOLN
2000.0000 mg | INTRAVENOUS | Status: AC
  Administered 2017-07-22: 2000 mg via TOPICAL
  Filled 2017-07-21: qty 20

## 2017-07-21 MED ORDER — LACTATED RINGERS IV SOLN
INTRAVENOUS | Status: DC
  Administered 2017-07-22 (×2): via INTRAVENOUS

## 2017-07-21 MED ORDER — CEFAZOLIN SODIUM-DEXTROSE 2-4 GM/100ML-% IV SOLN
2.0000 g | INTRAVENOUS | Status: AC
  Administered 2017-07-22: 2 g via INTRAVENOUS
  Filled 2017-07-21 (×2): qty 100

## 2017-07-21 NOTE — Anesthesia Preprocedure Evaluation (Addendum)
Anesthesia Evaluation  Patient identified by MRN, date of birth, ID band Patient awake    Reviewed: Allergy & Precautions, H&P , NPO status , Patient's Chart, lab work & pertinent test results, reviewed documented beta blocker date and time   Airway Mallampati: I  TM Distance: >3 FB Neck ROM: full    Dental no notable dental hx. (+) Teeth Intact   Pulmonary neg pulmonary ROS,    Pulmonary exam normal        Cardiovascular negative cardio ROS Normal cardiovascular exam     Neuro/Psych  Headaches, PSYCHIATRIC DISORDERS Anxiety Depression    GI/Hepatic negative GI ROS, Neg liver ROS,   Endo/Other  diabetes  Renal/GU negative Renal ROS     Musculoskeletal  (+) Arthritis , Osteoarthritis,  Neuropathy to bilateral legs Weakness to left foot with foot drop Motor and sensation intact to left foot   Abdominal Normal abdominal exam  (+) + obese,   Peds  Hematology negative hematology ROS (+)   Anesthesia Other Findings   Reproductive/Obstetrics                           Anesthesia Physical  Anesthesia Plan  ASA: II  Anesthesia Plan: Spinal and Regional   Post-op Pain Management:  Regional for Post-op pain   Induction:   PONV Risk Score and Plan: 2 and Dexamethasone, Ondansetron, Propofol infusion and Treatment may vary due to age or medical condition  Airway Management Planned: Natural Airway  Additional Equipment:   Intra-op Plan:   Post-operative Plan:   Informed Consent: I have reviewed the patients History and Physical, chart, labs and discussed the procedure including the risks, benefits and alternatives for the proposed anesthesia with the patient or authorized representative who has indicated his/her understanding and acceptance.   Dental advisory given  Plan Discussed with: CRNA  Anesthesia Plan Comments:       Anesthesia Quick Evaluation

## 2017-07-22 ENCOUNTER — Inpatient Hospital Stay (HOSPITAL_COMMUNITY)
Admission: RE | Admit: 2017-07-22 | Discharge: 2017-07-24 | DRG: 470 | Disposition: A | Discharge status: Home Health | Payer: BLUE CROSS/BLUE SHIELD | Source: Ambulatory Visit | Attending: Orthopaedic Surgery | Admitting: Orthopaedic Surgery

## 2017-07-22 ENCOUNTER — Encounter (HOSPITAL_COMMUNITY)
Admission: RE | Disposition: A | Discharge status: Home Health | Payer: Self-pay | Source: Ambulatory Visit | Attending: Orthopaedic Surgery

## 2017-07-22 ENCOUNTER — Encounter (HOSPITAL_COMMUNITY): Payer: Self-pay | Admitting: *Deleted

## 2017-07-22 ENCOUNTER — Other Ambulatory Visit: Payer: Self-pay

## 2017-07-22 ENCOUNTER — Inpatient Hospital Stay (HOSPITAL_COMMUNITY): Payer: BLUE CROSS/BLUE SHIELD | Admitting: Anesthesiology

## 2017-07-22 ENCOUNTER — Inpatient Hospital Stay (HOSPITAL_COMMUNITY): Payer: BLUE CROSS/BLUE SHIELD | Admitting: Vascular Surgery

## 2017-07-22 DIAGNOSIS — Z841 Family history of disorders of kidney and ureter: Secondary | ICD-10-CM | POA: Diagnosis not present

## 2017-07-22 DIAGNOSIS — E785 Hyperlipidemia, unspecified: Secondary | ICD-10-CM | POA: Diagnosis not present

## 2017-07-22 DIAGNOSIS — Y92230 Patient room in hospital as the place of occurrence of the external cause: Secondary | ICD-10-CM | POA: Diagnosis not present

## 2017-07-22 DIAGNOSIS — Z823 Family history of stroke: Secondary | ICD-10-CM

## 2017-07-22 DIAGNOSIS — L299 Pruritus, unspecified: Secondary | ICD-10-CM | POA: Diagnosis not present

## 2017-07-22 DIAGNOSIS — Z803 Family history of malignant neoplasm of breast: Secondary | ICD-10-CM

## 2017-07-22 DIAGNOSIS — T428X5A Adverse effect of antiparkinsonism drugs and other central muscle-tone depressants, initial encounter: Secondary | ICD-10-CM | POA: Diagnosis not present

## 2017-07-22 DIAGNOSIS — Z86718 Personal history of other venous thrombosis and embolism: Secondary | ICD-10-CM

## 2017-07-22 DIAGNOSIS — Z8261 Family history of arthritis: Secondary | ICD-10-CM | POA: Diagnosis not present

## 2017-07-22 DIAGNOSIS — G8918 Other acute postprocedural pain: Secondary | ICD-10-CM | POA: Diagnosis not present

## 2017-07-22 DIAGNOSIS — Z8249 Family history of ischemic heart disease and other diseases of the circulatory system: Secondary | ICD-10-CM | POA: Diagnosis not present

## 2017-07-22 DIAGNOSIS — Z833 Family history of diabetes mellitus: Secondary | ICD-10-CM | POA: Diagnosis not present

## 2017-07-22 DIAGNOSIS — M1712 Unilateral primary osteoarthritis, left knee: Secondary | ICD-10-CM | POA: Diagnosis not present

## 2017-07-22 DIAGNOSIS — D509 Iron deficiency anemia, unspecified: Secondary | ICD-10-CM | POA: Diagnosis not present

## 2017-07-22 DIAGNOSIS — Z9884 Bariatric surgery status: Secondary | ICD-10-CM

## 2017-07-22 HISTORY — PX: TOTAL KNEE ARTHROPLASTY: SHX125

## 2017-07-22 SURGERY — ARTHROPLASTY, KNEE, TOTAL
Anesthesia: Monitor Anesthesia Care | Site: Knee | Laterality: Left

## 2017-07-22 MED ORDER — DEXAMETHASONE SODIUM PHOSPHATE 10 MG/ML IJ SOLN
INTRAMUSCULAR | Status: AC
  Filled 2017-07-22: qty 1

## 2017-07-22 MED ORDER — CHLORHEXIDINE GLUCONATE 4 % EX LIQD: 60.0000 mL | Freq: Once | CUTANEOUS | Status: DC

## 2017-07-22 MED ORDER — BUPIVACAINE LIPOSOME 1.3 % IJ SUSP
INTRAMUSCULAR | Status: DC | PRN
  Administered 2017-07-22: 20 mL

## 2017-07-22 MED ORDER — SODIUM CHLORIDE 0.9 % IJ SOLN
INTRAMUSCULAR | Status: DC | PRN
  Administered 2017-07-22: 30 mL

## 2017-07-22 MED ORDER — RIVAROXABAN 10 MG PO TABS
10.0000 mg | ORAL_TABLET | Freq: Every day | ORAL | Status: DC
  Administered 2017-07-23 – 2017-07-24 (×2): 10 mg via ORAL
  Filled 2017-07-22 (×2): qty 1

## 2017-07-22 MED ORDER — FENTANYL CITRATE (PF) 250 MCG/5ML IJ SOLN
INTRAMUSCULAR | Status: AC
Start: 2017-07-22 — End: 2017-07-22
  Filled 2017-07-22: qty 5

## 2017-07-22 MED ORDER — CEFAZOLIN SODIUM-DEXTROSE 2-4 GM/100ML-% IV SOLN
2.0000 g | Freq: Four times a day (QID) | INTRAVENOUS | Status: AC
  Administered 2017-07-22 (×2): 2 g via INTRAVENOUS
  Filled 2017-07-22 (×2): qty 100

## 2017-07-22 MED ORDER — KETAMINE HCL 10 MG/ML IJ SOLN
INTRAMUSCULAR | Status: DC | PRN
  Administered 2017-07-22 (×3): 10 mg via INTRAVENOUS

## 2017-07-22 MED ORDER — 0.9 % SODIUM CHLORIDE (POUR BTL) OPTIME
TOPICAL | Status: DC | PRN
  Administered 2017-07-22: 1000 mL

## 2017-07-22 MED ORDER — DEXAMETHASONE SODIUM PHOSPHATE 10 MG/ML IJ SOLN
INTRAMUSCULAR | Status: DC | PRN
  Administered 2017-07-22: 10 mg via INTRAVENOUS

## 2017-07-22 MED ORDER — LIDOCAINE HCL (CARDIAC) 20 MG/ML IV SOLN
INTRAVENOUS | Status: DC | PRN
  Administered 2017-07-22: 50 mg via INTRATRACHEAL

## 2017-07-22 MED ORDER — METOPROLOL TARTARATE 1 MG/ML SYRINGE (5ML)
Status: DC | PRN
  Administered 2017-07-22: 2 mg via INTRAVENOUS
  Administered 2017-07-22: 1 mg via INTRAVENOUS

## 2017-07-22 MED ORDER — ONDANSETRON HCL 4 MG PO TABS: 4.0000 mg | ORAL_TABLET | Freq: Four times a day (QID) | ORAL | Status: DC | PRN

## 2017-07-22 MED ORDER — MIDAZOLAM HCL 2 MG/2ML IJ SOLN
INTRAMUSCULAR | Status: DC | PRN
  Administered 2017-07-22 (×2): 1 mg via INTRAVENOUS

## 2017-07-22 MED ORDER — ONDANSETRON HCL 4 MG/2ML IJ SOLN
INTRAMUSCULAR | Status: DC | PRN
  Administered 2017-07-22: 4 mg via INTRAVENOUS

## 2017-07-22 MED ORDER — PROPOFOL 1000 MG/100ML IV EMUL
INTRAVENOUS | Status: AC
  Filled 2017-07-22: qty 100

## 2017-07-22 MED ORDER — FENTANYL CITRATE (PF) 100 MCG/2ML IJ SOLN: 25.0000 ug | INTRAMUSCULAR | Status: DC | PRN

## 2017-07-22 MED ORDER — ACETAMINOPHEN 650 MG RE SUPP: 650.0000 mg | RECTAL | Status: DC | PRN

## 2017-07-22 MED ORDER — MENTHOL 3 MG MT LOZG: 1.0000 | LOZENGE | OROMUCOSAL | Status: DC | PRN

## 2017-07-22 MED ORDER — ALUM & MAG HYDROXIDE-SIMETH 200-200-20 MG/5ML PO SUSP: 30.0000 mL | ORAL | Status: DC | PRN

## 2017-07-22 MED ORDER — HYDROMORPHONE HCL 1 MG/ML IJ SOLN
0.5000 mg | INTRAMUSCULAR | Status: DC | PRN
  Administered 2017-07-22 – 2017-07-23 (×6): 1 mg via INTRAVENOUS
  Filled 2017-07-22 (×7): qty 1

## 2017-07-22 MED ORDER — BUPIVACAINE-EPINEPHRINE (PF) 0.5% -1:200000 IJ SOLN
INTRAMUSCULAR | Status: DC | PRN
  Administered 2017-07-22: 30 mL

## 2017-07-22 MED ORDER — METHOCARBAMOL 1000 MG/10ML IJ SOLN: 500.0000 mg | Freq: Four times a day (QID) | INTRAVENOUS | Status: DC | PRN

## 2017-07-22 MED ORDER — PHENOL 1.4 % MT LIQD: 1.0000 | OROMUCOSAL | Status: DC | PRN

## 2017-07-22 MED ORDER — SODIUM CHLORIDE 0.9 % IR SOLN
Status: DC | PRN
  Administered 2017-07-22: 1

## 2017-07-22 MED ORDER — DIPHENHYDRAMINE HCL 12.5 MG/5ML PO ELIX
12.5000 mg | ORAL_SOLUTION | ORAL | Status: DC | PRN
  Administered 2017-07-23: 25 mg via ORAL
  Filled 2017-07-22: qty 10

## 2017-07-22 MED ORDER — METOCLOPRAMIDE HCL 5 MG/ML IJ SOLN: 5.0000 mg | Freq: Three times a day (TID) | INTRAMUSCULAR | Status: DC | PRN

## 2017-07-22 MED ORDER — PROPOFOL 500 MG/50ML IV EMUL
INTRAVENOUS | Status: DC | PRN
  Administered 2017-07-22: 25 ug/kg/min via INTRAVENOUS

## 2017-07-22 MED ORDER — BUPIVACAINE IN DEXTROSE 0.75-8.25 % IT SOLN
INTRATHECAL | Status: DC | PRN
  Administered 2017-07-22: 1.8 mL via INTRATHECAL

## 2017-07-22 MED ORDER — DULOXETINE HCL 60 MG PO CPEP
120.0000 mg | ORAL_CAPSULE | Freq: Every morning | ORAL | Status: DC
  Administered 2017-07-23 – 2017-07-24 (×2): 120 mg via ORAL
  Filled 2017-07-22 (×2): qty 2

## 2017-07-22 MED ORDER — PREGABALIN 25 MG PO CAPS
25.0000 mg | ORAL_CAPSULE | Freq: Three times a day (TID) | ORAL | Status: DC
  Administered 2017-07-22 – 2017-07-24 (×5): 25 mg via ORAL
  Filled 2017-07-22 (×6): qty 1

## 2017-07-22 MED ORDER — ROPIVACAINE HCL 5 MG/ML IJ SOLN
INTRAMUSCULAR | Status: DC | PRN
  Administered 2017-07-22: 30 mL via PERINEURAL

## 2017-07-22 MED ORDER — TRANEXAMIC ACID 1000 MG/10ML IV SOLN
1000.0000 mg | Freq: Once | INTRAVENOUS | Status: AC
  Administered 2017-07-22: 1000 mg via INTRAVENOUS
  Filled 2017-07-22: qty 10

## 2017-07-22 MED ORDER — ONDANSETRON HCL 4 MG/2ML IJ SOLN: 4.0000 mg | Freq: Four times a day (QID) | INTRAMUSCULAR | Status: DC | PRN

## 2017-07-22 MED ORDER — HYDROCODONE-ACETAMINOPHEN 5-325 MG PO TABS
ORAL_TABLET | ORAL | Status: AC
  Filled 2017-07-22: qty 2

## 2017-07-22 MED ORDER — PROPOFOL 10 MG/ML IV BOLUS
INTRAVENOUS | Status: DC | PRN
  Administered 2017-07-22: 20 mg via INTRAVENOUS
  Administered 2017-07-22: 30 mg via INTRAVENOUS
  Administered 2017-07-22 (×2): 50 mg via INTRAVENOUS
  Administered 2017-07-22 (×4): 25 mg via INTRAVENOUS

## 2017-07-22 MED ORDER — LACTATED RINGERS IV SOLN
INTRAVENOUS | Status: DC
  Administered 2017-07-22: 12:00:00 via INTRAVENOUS

## 2017-07-22 MED ORDER — METOCLOPRAMIDE HCL 5 MG PO TABS: 5.0000 mg | ORAL_TABLET | Freq: Three times a day (TID) | ORAL | Status: DC | PRN

## 2017-07-22 MED ORDER — FENTANYL CITRATE (PF) 250 MCG/5ML IJ SOLN
INTRAMUSCULAR | Status: DC | PRN
  Administered 2017-07-22: 25 ug via INTRAVENOUS
  Administered 2017-07-22: 50 ug via INTRAVENOUS
  Administered 2017-07-22: 25 ug via INTRAVENOUS
  Administered 2017-07-22: 50 ug via INTRAVENOUS

## 2017-07-22 MED ORDER — ONDANSETRON HCL 4 MG/2ML IJ SOLN: 4.0000 mg | Freq: Once | INTRAMUSCULAR | Status: DC | PRN

## 2017-07-22 MED ORDER — ONDANSETRON HCL 4 MG/2ML IJ SOLN
INTRAMUSCULAR | Status: AC
  Filled 2017-07-22: qty 2

## 2017-07-22 MED ORDER — LIDOCAINE 2% (20 MG/ML) 5 ML SYRINGE
INTRAMUSCULAR | Status: AC
  Filled 2017-07-22: qty 5

## 2017-07-22 MED ORDER — BISACODYL 5 MG PO TBEC: 5.0000 mg | DELAYED_RELEASE_TABLET | Freq: Every day | ORAL | Status: DC | PRN

## 2017-07-22 MED ORDER — PROPOFOL 10 MG/ML IV BOLUS
INTRAVENOUS | Status: AC
  Filled 2017-07-22: qty 20

## 2017-07-22 MED ORDER — PHENYLEPHRINE HCL 10 MG/ML IJ SOLN
INTRAMUSCULAR | Status: DC | PRN
  Administered 2017-07-22: 25 ug/min via INTRAVENOUS

## 2017-07-22 MED ORDER — METHOCARBAMOL 500 MG PO TABS
500.0000 mg | ORAL_TABLET | Freq: Four times a day (QID) | ORAL | Status: DC | PRN
  Administered 2017-07-22 – 2017-07-23 (×2): 500 mg via ORAL
  Filled 2017-07-22 (×2): qty 1

## 2017-07-22 MED ORDER — ACETAMINOPHEN 325 MG PO TABS: 650.0000 mg | ORAL_TABLET | ORAL | Status: DC | PRN

## 2017-07-22 MED ORDER — GLYCOPYRROLATE 0.2 MG/ML IJ SOLN
INTRAMUSCULAR | Status: DC | PRN
  Administered 2017-07-22: 0.2 mg via INTRAVENOUS

## 2017-07-22 MED ORDER — BUPIVACAINE-EPINEPHRINE (PF) 0.5% -1:200000 IJ SOLN
INTRAMUSCULAR | Status: AC
  Filled 2017-07-22: qty 30

## 2017-07-22 MED ORDER — KETAMINE HCL-SODIUM CHLORIDE 100-0.9 MG/10ML-% IV SOSY
PREFILLED_SYRINGE | INTRAVENOUS | Status: AC
  Filled 2017-07-22: qty 10

## 2017-07-22 MED ORDER — MIDAZOLAM HCL 2 MG/2ML IJ SOLN
INTRAMUSCULAR | Status: AC
  Filled 2017-07-22: qty 2

## 2017-07-22 MED ORDER — TRANEXAMIC ACID 1000 MG/10ML IV SOLN
1000.0000 mg | INTRAVENOUS | Status: AC
  Administered 2017-07-22: 1000 mg via INTRAVENOUS
  Filled 2017-07-22: qty 1100

## 2017-07-22 MED ORDER — HYDROCODONE-ACETAMINOPHEN 5-325 MG PO TABS
2.0000 | ORAL_TABLET | ORAL | Status: DC | PRN
  Administered 2017-07-22 – 2017-07-23 (×5): 2 via ORAL
  Filled 2017-07-22 (×4): qty 2

## 2017-07-22 MED ORDER — DOCUSATE SODIUM 100 MG PO CAPS
100.0000 mg | ORAL_CAPSULE | Freq: Two times a day (BID) | ORAL | Status: DC
  Administered 2017-07-22 – 2017-07-24 (×5): 100 mg via ORAL
  Filled 2017-07-22 (×5): qty 1

## 2017-07-22 MED ORDER — BUPIVACAINE LIPOSOME 1.3 % IJ SUSP
20.0000 mL | Freq: Once | INTRAMUSCULAR | Status: DC
  Filled 2017-07-22 (×2): qty 20

## 2017-07-22 MED ORDER — HYDROCODONE-ACETAMINOPHEN 5-325 MG PO TABS: 1.0000 | ORAL_TABLET | ORAL | Status: DC | PRN

## 2017-07-22 SURGICAL SUPPLY — 61 items
BAG DECANTER FOR FLEXI CONT (MISCELLANEOUS) ×3 IMPLANT
BANDAGE ACE 6X5 VEL STRL LF (GAUZE/BANDAGES/DRESSINGS) ×3 IMPLANT
BANDAGE ESMARK 6X9 LF (GAUZE/BANDAGES/DRESSINGS) ×1 IMPLANT
BLADE SAGITTAL 25.0X1.19X90 (BLADE) IMPLANT
BLADE SAGITTAL 25.0X1.19X90MM (BLADE)
BLADE SAW SGTL 13.0X1.19X90.0M (BLADE) IMPLANT
BNDG ELASTIC 6X10 VLCR STRL LF (GAUZE/BANDAGES/DRESSINGS) ×3 IMPLANT
BNDG ESMARK 6X9 LF (GAUZE/BANDAGES/DRESSINGS) ×3
BOWL SMART MIX CTS (DISPOSABLE) ×3 IMPLANT
CAP KNEE TOTAL 3 SIGMA ×3 IMPLANT
CEMENT HV SMART SET (Cement) ×6 IMPLANT
CLOSURE WOUND 1/2 X4 (GAUZE/BANDAGES/DRESSINGS) ×2
COVER SURGICAL LIGHT HANDLE (MISCELLANEOUS) ×3 IMPLANT
CUFF TOURNIQUET SINGLE 34IN LL (TOURNIQUET CUFF) ×3 IMPLANT
CUFF TOURNIQUET SINGLE 44IN (TOURNIQUET CUFF) IMPLANT
DECANTER SPIKE VIAL GLASS SM (MISCELLANEOUS) ×3 IMPLANT
DRAPE EXTREMITY T 121X128X90 (DRAPE) ×3 IMPLANT
DRAPE HALF SHEET 40X57 (DRAPES) ×6 IMPLANT
DRAPE U-SHAPE 47X51 STRL (DRAPES) ×3 IMPLANT
DRESSING AQUACEL AG SP 3.5X6 (GAUZE/BANDAGES/DRESSINGS) ×1 IMPLANT
DRSG AQUACEL AG ADV 3.5X 6 (GAUZE/BANDAGES/DRESSINGS) ×3 IMPLANT
DRSG AQUACEL AG ADV 3.5X10 (GAUZE/BANDAGES/DRESSINGS) ×3 IMPLANT
DRSG AQUACEL AG SP 3.5X6 (GAUZE/BANDAGES/DRESSINGS) ×3
DURAPREP 26ML APPLICATOR (WOUND CARE) ×3 IMPLANT
ELECT REM PT RETURN 9FT ADLT (ELECTROSURGICAL) ×3
ELECTRODE REM PT RTRN 9FT ADLT (ELECTROSURGICAL) ×1 IMPLANT
GLOVE BIO SURGEON STRL SZ8 (GLOVE) ×6 IMPLANT
GLOVE BIOGEL M 6.5 STRL (GLOVE) ×12 IMPLANT
GLOVE BIOGEL M STER SZ 6 (GLOVE) ×12 IMPLANT
GLOVE BIOGEL PI IND STRL 8 (GLOVE) ×4 IMPLANT
GLOVE BIOGEL PI INDICATOR 8 (GLOVE) ×8
GLOVE BIOGEL PI ORTHO PRO SZ8 (GLOVE) ×2
GLOVE PI ORTHO PRO STRL SZ8 (GLOVE) ×1 IMPLANT
GOWN STRL REUS W/ TWL LRG LVL3 (GOWN DISPOSABLE) ×1 IMPLANT
GOWN STRL REUS W/ TWL XL LVL3 (GOWN DISPOSABLE) ×2 IMPLANT
GOWN STRL REUS W/TWL LRG LVL3 (GOWN DISPOSABLE) ×2
GOWN STRL REUS W/TWL XL LVL3 (GOWN DISPOSABLE) ×4
HANDPIECE INTERPULSE COAX TIP (DISPOSABLE) ×2
HOOD PEEL AWAY FACE SHEILD DIS (HOOD) ×6 IMPLANT
IMMOBILIZER KNEE 22 UNIV (SOFTGOODS) ×3 IMPLANT
KIT BASIN OR (CUSTOM PROCEDURE TRAY) ×3 IMPLANT
KIT ROOM TURNOVER OR (KITS) ×3 IMPLANT
MANIFOLD NEPTUNE II (INSTRUMENTS) ×3 IMPLANT
NEEDLE HYPO 21X1 ECLIPSE (NEEDLE) ×3 IMPLANT
NS IRRIG 1000ML POUR BTL (IV SOLUTION) ×3 IMPLANT
PACK TOTAL JOINT (CUSTOM PROCEDURE TRAY) ×3 IMPLANT
PAD ARMBOARD 7.5X6 YLW CONV (MISCELLANEOUS) ×6 IMPLANT
PIN STEINMAN FIXATION KNEE (PIN) IMPLANT
SET HNDPC FAN SPRY TIP SCT (DISPOSABLE) ×1 IMPLANT
STRIP CLOSURE SKIN 1/2X4 (GAUZE/BANDAGES/DRESSINGS) ×4 IMPLANT
SUT VIC AB 0 CT1 27 (SUTURE) ×2
SUT VIC AB 0 CT1 27XBRD ANBCTR (SUTURE) ×1 IMPLANT
SUT VIC AB 2-0 CT1 27 (SUTURE) ×2
SUT VIC AB 2-0 CT1 TAPERPNT 27 (SUTURE) ×1 IMPLANT
SUT VIC AB 3-0 FS2 27 (SUTURE) ×3 IMPLANT
SUT VLOC 180 0 24IN GS25 (SUTURE) ×3 IMPLANT
SYR 50ML LL SCALE MARK (SYRINGE) ×3 IMPLANT
TOWEL OR 17X24 6PK STRL BLUE (TOWEL DISPOSABLE) ×3 IMPLANT
TOWEL OR 17X26 10 PK STRL BLUE (TOWEL DISPOSABLE) ×3 IMPLANT
TRAY CATH 16FR W/PLASTIC CATH (SET/KITS/TRAYS/PACK) IMPLANT
UPCHARGE REV TRAY MBT KNEE ×3 IMPLANT

## 2017-07-22 NOTE — Interval H&P Note (Signed)
History and Physical Interval Note:  07/22/2017 7:22 AM  [name]  has presented today for surgery, with the diagnosis of LEFT KNEE DEGENERATIVE JOINT DISEASE  The various methods of treatment have been discussed with the patient and family. After consideration of risks, benefits and other options for treatment, the patient has consented to  Procedure(s): TOTAL KNEE ARTHROPLASTY (Left) as a surgical intervention .  The patient's history has been reviewed, patient examined, no change in status, stable for surgery.  I have reviewed the patient's chart and labs.  Questions were answered to the patient's satisfaction.     Laretha Luepke G

## 2017-07-22 NOTE — Op Note (Signed)
PREOP DIAGNOSIS: DJD LEFT KNEE POSTOP DIAGNOSIS:  same PROCEDURE: LEFT TKR ANESTHESIA: Spinal and MAC ATTENDING SURGEON: Humna Moorehouse G ASSISTANT: Loni Dolly PA  INDICATIONS FOR PROCEDURE: [name] is a 46 y.o. female who has struggled for a long time with pain due to degenerative arthritis of the left knee.  The patient has failed many conservative non-operative measures and at this point has pain which limits the ability to sleep and walk.  The patient is offered total knee replacement.  Informed operative consent was obtained after discussion of possible risks of anesthesia, infection, neurovascular injury, DVT, and death.  The importance of the post-operative rehabilitation protocol to optimize result was stressed extensively with the patient.  SUMMARY OF FINDINGS AND PROCEDURE:  [name] was taken to the operative suite where under the above anesthesia a left knee replacement was performed.  There were advanced degenerative changes and the bone quality was good.  We used the DePuyLCS system and placed size standard plus femur, 4MBT revision tibia, 35 mm all polyethylene patella, and a size 12.5 mm spacer.  Loni Dolly PA-C assisted throughout and was invaluable to the completion of the case in that he helped retract and maintain exposure while I placed the components.  He also helped close thereby minimizing OR time.  The patient was admitted for appropriate post-op care to include perioperative antibiotics and mechanical and pharmacologic measures for DVT prophylaxis.  DESCRIPTION OF PROCEDURE:  [name] was taken to the operative suite where the above anesthesia was applied.  The patient was positioned supine and prepped and draped in normal sterile fashion.  An appropriate time out was performed.  After the administration of kefzol pre-op antibiotic the leg was elevated and exsanguinated and a tourniquet inflated.  A standard longitudinal incision was made on the anterior knee.   Dissection was carried down to the extensor mechanism.  All appropriate anti-infective measures were used including the pre-operative antibiotic, betadine impregnated drape, and closed hooded exhaust systems for each member of the surgical team.  A medial parapatellar incision was made in the extensor mechanism and the knee cap flipped and the knee flexed.  Some residual meniscal tissues were removed along with any remaining ACL/PCL tissue.  A guide was placed on the tibia and a flat cut was made on it's superior surface.  An intramedullary guide was placed in the femur and was utilized to make anterior and posterior cuts creating an appropriate flexion gap.  A second intramedullary guide was placed in the femur to make a distal cut properly balancing the knee with an extension gap equal to the flexion gap.  The three bones sized to the above mentioned sizes and the appropriate guides were placed and utilized.  A trial reduction was done and the knee easily came to full extension and the patella tracked well on flexion.  The trial components were removed and all bones were cleaned with pulsatile lavage and then dried thoroughly.  Cement was mixed and was pressurized onto the bones followed by placement of the aforementioned components.  Excess cement was trimmed and pressure was held on the components until the cement had hardened.  The tourniquet was deflated and a small amount of bleeding was controlled with cautery and pressure.  The knee was irrigated thoroughly.  The extensor mechanism was re-approximated with V-loc suture in running fashion.  The knee was flexed and the repair was solid.  The subcutaneous tissues were re-approximated with #0 and #2-0 vicryl and the skin closed with  a subcuticular stitch and steristrips.  A sterile dressing was applied.  Intraoperative fluids, EBL, and tourniquet time can be obtained from anesthesia records.  DISPOSITION:  The patient was taken to recovery room in stable  condition and admitted for appropriate post-op care to include peri-operative antibiotic and DVT prophylaxis with mechanical and pharmacologic measures.  Nic Lampe G 07/22/2017, 9:32 AM

## 2017-07-22 NOTE — Progress Notes (Signed)
Orthopedic Tech Progress Note Patient Details:  [name] 1971/01/20 [id number]  CPM Left Knee CPM Left Knee: On Left Knee Flexion (Degrees): 90 Left Knee Extension (Degrees): 0 Additional Comments: foot roll  Post Interventions Patient Tolerated: Well Instructions Provided: Care of device  [name] 07/22/2017, 10:19 AM

## 2017-07-22 NOTE — Evaluation (Signed)
Physical Therapy Evaluation Patient Details Name: [name] MRN: [id number] DOB: 12-19-1970 Today's Date: 07/22/2017   History of Present Illness  Pt is a 46 y/o female s/p elective L TKA. PMH includes DM, anxiety/depression, DVT, and neuropathy in hands and feet.   Clinical Impression  Pt is s/p surgery above with deficits below. PTA, pt was independent with functional mobility. Upon eval, pt presenting with post op pain and weakness. Limited distance to within the room secondary to pain and pt required min to min guard assist with mobility. Reports wife will be able to assist at d/c and will need DME below. Follow up recommendations per MD arrangements. Will continue to follow acutely to maximize functional mobility independence and safety.     Follow Up Recommendations DC plan and follow up therapy as arranged by surgeon;Supervision for mobility/OOB    Equipment Recommendations  Rolling walker with 5" wheels;3in1 (PT)    Recommendations for Other Services       Precautions / Restrictions Precautions Precautions: Knee Precaution Booklet Issued: Yes (comment) Precaution Comments: Reviewed supine ther ex with pt  Required Braces or Orthoses: Knee Immobilizer - Left Knee Immobilizer - Left: Other (comment)(until discontinued ) Restrictions Weight Bearing Restrictions: Yes LLE Weight Bearing: Weight bearing as tolerated      Mobility  Bed Mobility Overal bed mobility: Needs Assistance Bed Mobility: Supine to Sit     Supine to sit: Min assist     General bed mobility comments: Min A for LLE assist.   Transfers Overall transfer level: Needs assistance Equipment used: Rolling walker (2 wheeled) Transfers: Sit to/from Stand Sit to Stand: Min assist         General transfer comment: Min A for lift assist and steadying. Verbal cues for safe hand placement.   Ambulation/Gait Ambulation/Gait assistance: Min guard Ambulation Distance (Feet): 20 Feet Assistive  device: Rolling walker (2 wheeled) Gait Pattern/deviations: Step-to pattern;Decreased step length - right;Decreased step length - left;Antalgic;Decreased weight shift to left Gait velocity: Decreased Gait velocity interpretation: Below normal speed for age/gender General Gait Details: Slow, antalgic gait secondary to pain in LLE. Decreased weightshift to LLE, however, was able to progress to step through technique. Verbal cues for sequencing using RW.   Stairs            Wheelchair Mobility    Modified Rankin (Stroke Patients Only)       Balance Overall balance assessment: Needs assistance Sitting-balance support: No upper extremity supported;Feet supported Sitting balance-Leahy Scale: Good     Standing balance support: Bilateral upper extremity supported;During functional activity Standing balance-Leahy Scale: Poor Standing balance comment: Reliant on UE support.                              Pertinent Vitals/Pain Pain Assessment: 0-10 Pain Score: 7  Pain Location: L knee  Pain Descriptors / Indicators: Aching;Operative site guarding Pain Intervention(s): Limited activity within patient's tolerance;Monitored during session;Repositioned    Home Living Family/patient expects to be discharged to:: Private residence Living Arrangements: Spouse/significant other;Children Available Help at Discharge: Family;Available 24 hours/day Type of Home: House Home Access: Stairs to enter Entrance Stairs-Rails: None Entrance Stairs-Number of Steps: 3 Home Layout: Multi-level;Able to live on main level with bedroom/bathroom Home Equipment: None      Prior Function Level of Independence: Independent               Hand Dominance   Dominant Hand: Right    Extremity/Trunk  Assessment   Upper Extremity Assessment Upper Extremity Assessment: Defer to OT evaluation    Lower Extremity Assessment Lower Extremity Assessment: LLE deficits/detail LLE Deficits /  Details: Neuropathy at baseline. Deficits consistent with post op pain and weakness. Able to perform ther ex below.     Cervical / Trunk Assessment Cervical / Trunk Assessment: Normal  Communication   Communication: No difficulties  Cognition Arousal/Alertness: Awake/alert Behavior During Therapy: WFL for tasks assessed/performed Overall Cognitive Status: Within Functional Limits for tasks assessed                                        General Comments      Exercises Total Joint Exercises Ankle Circles/Pumps: AROM;Both;20 reps Quad Sets: AROM;10 reps;Left Towel Squeeze: AROM;Both;10 reps Heel Slides: AROM;Left;10 reps   Assessment/Plan    PT Assessment Patient needs continued PT services  PT Problem List Decreased strength;Decreased range of motion;Decreased balance;Decreased mobility;Decreased knowledge of use of DME;Decreased knowledge of precautions;Impaired sensation;Pain       PT Treatment Interventions DME instruction;Gait training;Stair training;Therapeutic activities;Functional mobility training;Therapeutic exercise;Balance training;Neuromuscular re-education;Patient/family education    PT Goals (Current goals can be found in the Care Plan section)  Acute Rehab PT Goals Patient Stated Goal: to go home  PT Goal Formulation: With patient Time For Goal Achievement: 07/29/17 Potential to Achieve Goals: Good    Frequency 7X/week   Barriers to discharge        Co-evaluation               AM-PAC PT "6 Clicks" Daily Activity  Outcome Measure Difficulty turning over in bed (including adjusting bedclothes, sheets and blankets)?: A Little Difficulty moving from lying on back to sitting on the side of the bed? : Unable Difficulty sitting down on and standing up from a chair with arms (e.g., wheelchair, bedside commode, etc,.)?: Unable Help needed moving to and from a bed to chair (including a wheelchair)?: A Little Help needed walking in  hospital room?: A Little Help needed climbing 3-5 steps with a railing? : A Little 6 Click Score: 14    End of Session Equipment Utilized During Treatment: Gait belt;Left knee immobilizer Activity Tolerance: Patient tolerated treatment well Patient left: in chair;with call bell/phone within reach;with nursing/sitter in room Nurse Communication: Mobility status PT Visit Diagnosis: Other abnormalities of gait and mobility (R26.89);Pain Pain - Right/Left: Left Pain - part of body: Knee    Time: 6967-8938 PT Time Calculation (min) (ACUTE ONLY): 27 min   Charges:   PT Evaluation $PT Eval Low Complexity: 1 Low PT Treatments $Gait Training: 8-22 mins   PT G Codes:        Leighton Ruff, PT, DPT  Acute Rehabilitation Services  Pager: [phone]   Rudean Hitt 07/22/2017, 1:53 PM

## 2017-07-22 NOTE — Anesthesia Postprocedure Evaluation (Signed)
Anesthesia Post Note  Patient: Chaniya Genter  Procedure(s) Performed: TOTAL KNEE ARTHROPLASTY (Left Knee)     Patient location during evaluation: PACU Anesthesia Type: Spinal and Regional Level of consciousness: oriented and awake and alert Pain management: pain level controlled Vital Signs Assessment: post-procedure vital signs reviewed and stable Respiratory status: spontaneous breathing, respiratory function stable and patient connected to nasal cannula oxygen Cardiovascular status: blood pressure returned to baseline and stable Postop Assessment: no headache, no backache, no apparent nausea or vomiting and spinal receding Anesthetic complications: no    Last Vitals:  Vitals:   07/22/17 1122 07/22/17 1300  BP: 139/72 137/70  Pulse: 65 68  Resp: 18 18  Temp: 36.6 C 37.1 C  SpO2: 96% 98%    Last Pain:  Vitals:   07/22/17 1957  TempSrc:   PainSc: 3                  Amrit Cress P Benjie Ricketson

## 2017-07-22 NOTE — Anesthesia Procedure Notes (Signed)
Spinal  Patient location during procedure: OR Start time: 07/22/2017 7:45 AM End time: 07/22/2017 7:55 AM Staffing Anesthesiologist: Murvin Natal, MD Performed: anesthesiologist  Preanesthetic Checklist Completed: patient identified, surgical consent, pre-op evaluation, timeout performed, IV checked, risks and benefits discussed and monitors and equipment checked Spinal Block Patient position: sitting Prep: DuraPrep Patient monitoring: cardiac monitor, continuous pulse ox and blood pressure Approach: midline Location: L4-5 Injection technique: single-shot Needle Needle type: Pencan  Needle gauge: 24 G Needle length: 9 cm Assessment Sensory level: T10 Additional Notes Functioning IV was confirmed and monitors were applied. Sterile prep and drape, including hand hygiene and sterile gloves were used. The patient was positioned and the spine was prepped. The skin was anesthetized with lidocaine.  Free flow of clear CSF was obtained prior to injecting local anesthetic into the CSF.  The spinal needle aspirated freely following injection.  The needle was carefully withdrawn.  The patient tolerated the procedure well.

## 2017-07-22 NOTE — Transfer of Care (Signed)
Immediate Anesthesia Transfer of Care Note  Patient: Catlynn Grondahl  Procedure(s) Performed: TOTAL KNEE ARTHROPLASTY (Left Knee)  Patient Location: PACU  Anesthesia Type:MAC and Spinal  Level of Consciousness: awake, alert  and patient cooperative  Airway & Oxygen Therapy: Patient Spontanous Breathing and Patient connected to nasal cannula oxygen  Post-op Assessment: Report given to RN, Post -op Vital signs reviewed and stable, Patient moving all extremities X 4 and Patient able to stick tongue midline  Post vital signs: Reviewed and stable  Last Vitals:  Vitals:   07/22/17 0633 07/22/17 1006  BP: 138/73   Pulse: 64   Resp: 20   Temp: 36.7 C (P) 36.4 C  SpO2: 97%     Last Pain:  Vitals:   07/22/17 0633  TempSrc: Oral  PainSc:       Patients Stated Pain Goal: 8 (42/68/34 1962)  Complications: No apparent anesthesia complications

## 2017-07-22 NOTE — Anesthesia Procedure Notes (Signed)
Anesthesia Regional Block: Adductor canal block   Pre-Anesthetic Checklist: ,, timeout performed, Correct Patient, Correct Site, Correct Laterality, Correct Procedure,, site marked, risks and benefits discussed, Surgical consent,  Pre-op evaluation,  At surgeon's request and post-op pain management  Laterality: Left  Prep: chloraprep       Needles:  Injection technique: Single-shot  Needle Type: Echogenic Stimulator Needle     Needle Length: 10cm  Needle Gauge: 21     Additional Needles:   Procedures:,,,, ultrasound used (permanent image in chart),,,,  Narrative:  Start time: 07/22/2017 7:10 AM End time: 07/22/2017 7:20 AM Injection made incrementally with aspirations every 5 mL.  Performed by: Personally  Anesthesiologist: Murvin Natal, MD  Additional Notes: Functioning IV was confirmed and monitors were applied.  A 18mm 21ga Pajunk echogenic stimulator needle was used. Sterile prep, hand hygiene and sterile gloves were used.  Negative aspiration and negative test dose prior to incremental administration of local anesthetic. The patient tolerated the procedure well.

## 2017-07-22 NOTE — Discharge Instructions (Signed)

## 2017-07-23 ENCOUNTER — Encounter (HOSPITAL_COMMUNITY): Payer: Self-pay | Admitting: Orthopaedic Surgery

## 2017-07-23 MED ORDER — TIZANIDINE HCL 4 MG PO TABS
2.0000 mg | ORAL_TABLET | Freq: Four times a day (QID) | ORAL | Status: DC | PRN
  Administered 2017-07-23: 4 mg via ORAL
  Filled 2017-07-23: qty 1

## 2017-07-23 MED ORDER — HYDROCODONE-ACETAMINOPHEN 7.5-325 MG PO TABS
1.0000 | ORAL_TABLET | ORAL | Status: DC | PRN
  Administered 2017-07-23: 1 via ORAL
  Administered 2017-07-23: 2 via ORAL
  Administered 2017-07-23 – 2017-07-24 (×9): 1 via ORAL
  Filled 2017-07-23 (×6): qty 1
  Filled 2017-07-23: qty 2
  Filled 2017-07-23 (×4): qty 1

## 2017-07-23 NOTE — Progress Notes (Signed)
Subjective: 1 Day Post-Op Procedure(s) (LRB): TOTAL KNEE ARTHROPLASTY (Left)   Patient had apparent allergic reaction to robaxin with intense itching. Benadryl was given which helped. Her block is also wearing off so she as asking abotu a slight increase in pain medicine as she can not quite catch up to the pain.  Activity level:  wbat Diet tolerance:  ok Voiding:  ok Patient reports pain as mild and moderate.    Objective: Vital signs in last 24 hours: Temp:  [97.6 F (36.4 C)-98.7 F (37.1 C)] 98.4 F (36.9 C) (12/12 0517) Pulse Rate:  [57-71] 66 (12/12 0642) Resp:  [13-25] 17 (12/12 0517) BP: (107-176)/(69-82) 157/80 (12/12 0642) SpO2:  [94 %-99 %] 99 % (12/12 0517)  Labs: No results for input(s): HGB in the last 72 hours. No results for input(s): WBC, RBC, HCT, PLT in the last 72 hours. No results for input(s): NA, K, CL, CO2, BUN, CREATININE, GLUCOSE, CALCIUM in the last 72 hours. No results for input(s): LABPT, INR in the last 72 hours.  Physical Exam:  Neurologically intact ABD soft Neurovascular intact Sensation intact distally Intact pulses distally Dorsiflexion/Plantar flexion intact Incision: dressing C/D/I and no drainage No cellulitis present Compartment soft  Assessment/Plan:  1 Day Post-Op Procedure(s) (LRB): TOTAL KNEE ARTHROPLASTY (Left) Advance diet Up with therapy Plan for discharge tomorrow Discharge home with home health if doing well and cleared by PT. We will have her on Xarelto 10mg  daily for 3 weeks per Dr. Marin Olp for DVT prevention with history of clotting disorder. I will remove Ace wrap tomorrow. Follow up in office 2 weeks post op.  [name], Larwance Sachs 07/23/2017, 7:43 AM

## 2017-07-23 NOTE — Progress Notes (Deleted)
Patient discharged at this time. Patient was picked up by a family member. Patient off the floor via wheel chair.

## 2017-07-23 NOTE — Progress Notes (Signed)
Physical Therapy Treatment Patient Details Name: [name] MRN: [id number] DOB: [birth year] Today's Date: 07/23/2017    History of Present Illness Pt is a 46 y/o female s/p elective L TKA. PMH includes DM, anxiety/depression, DVT, and neuropathy in hands and feet.     PT Comments    Patient is making progress toward mobility goals and tolerated increased gait distance. Current plan remains appropriate.    Follow Up Recommendations  DC plan and follow up therapy as arranged by surgeon;Supervision for mobility/OOB     Equipment Recommendations  Rolling walker with 5" wheels;3in1 (PT)    Recommendations for Other Services       Precautions / Restrictions Precautions Precautions: Knee Precaution Booklet Issued: Yes (comment) Precaution Comments: positioning reviewed with pt Required Braces or Orthoses: Knee Immobilizer - Left Knee Immobilizer - Left: Other (comment)(until discontinued ) Restrictions Weight Bearing Restrictions: Yes LLE Weight Bearing: Weight bearing as tolerated    Mobility  Bed Mobility Overal bed mobility: Needs Assistance Bed Mobility: Supine to Sit     Supine to sit: Supervision     General bed mobility comments: supervision for safety  Transfers Overall transfer level: Needs assistance Equipment used: Rolling walker (2 wheeled) Transfers: Sit to/from Stand Sit to Stand: Min guard         General transfer comment: cues for safe hand placement from EOB and BSC  Ambulation/Gait Ambulation/Gait assistance: Min guard Ambulation Distance (Feet): 150 Feet Assistive device: Rolling walker (2 wheeled) Gait Pattern/deviations: Decreased step length - right;Decreased step length - left;Antalgic;Decreased weight shift to left;Step-through pattern;Decreased stance time - left Gait velocity: Decreased   General Gait Details: cues for sequencing and L heel strike and increased L knee flexion during swing phase   Stairs             Wheelchair Mobility    Modified Rankin (Stroke Patients Only)       Balance Overall balance assessment: Needs assistance Sitting-balance support: No upper extremity supported;Feet supported Sitting balance-Leahy Scale: Good     Standing balance support: Bilateral upper extremity supported;During functional activity Standing balance-Leahy Scale: Poor Standing balance comment: Reliant on UE support.                             Cognition Arousal/Alertness: Awake/alert Behavior During Therapy: WFL for tasks assessed/performed Overall Cognitive Status: Within Functional Limits for tasks assessed                                        Exercises      General Comments        Pertinent Vitals/Pain Pain Assessment: Faces Faces Pain Scale: Hurts even more Pain Location: L knee  Pain Descriptors / Indicators: Aching;Grimacing;Guarding;Sore Pain Intervention(s): Limited activity within patient's tolerance;Monitored during session;Premedicated before session;Repositioned;Ice applied    Home Living                      Prior Function            PT Goals (current goals can now be found in the care plan section) Acute Rehab PT Goals Patient Stated Goal: to go home  PT Goal Formulation: With patient Time For Goal Achievement: 07/29/17 Potential to Achieve Goals: Good Progress towards PT goals: Progressing toward goals    Frequency    7X/week  PT Plan Current plan remains appropriate    Co-evaluation              AM-PAC PT "6 Clicks" Daily Activity  Outcome Measure  Difficulty turning over in bed (including adjusting bedclothes, sheets and blankets)?: A Little Difficulty moving from lying on back to sitting on the side of the bed? : A Lot Difficulty sitting down on and standing up from a chair with arms (e.g., wheelchair, bedside commode, etc,.)?: Unable Help needed moving to and from a bed to chair (including a  wheelchair)?: A Little Help needed walking in hospital room?: A Little Help needed climbing 3-5 steps with a railing? : A Little 6 Click Score: 15    End of Session Equipment Utilized During Treatment: Gait belt Activity Tolerance: Patient tolerated treatment well Patient left: in chair;with call bell/phone within reach Nurse Communication: Mobility status PT Visit Diagnosis: Other abnormalities of gait and mobility (R26.89);Pain Pain - Right/Left: Left Pain - part of body: Knee     Time: 6629-4765 PT Time Calculation (min) (ACUTE ONLY): 33 min  Charges:  $Gait Training: 8-22 mins $Therapeutic Activity: 8-22 mins                    G Codes:       Earney Navy, PTA Pager: [phone]     Darliss Cheney 07/23/2017, 1:23 PM

## 2017-07-23 NOTE — Evaluation (Addendum)
Occupational Therapy Evaluation Patient Details Name: [name] MRN: [id number] DOB: [birth year] Today's Date: 07/23/2017    History of Present Illness Pt is a 46 y/o female s/p elective L TKA. PMH includes DM, anxiety/depression, DVT, and neuropathy in hands and feet.    Clinical Impression   This 46 y/o F presents with the above. At baseline Pt is independent with ADLs and functional mobility, presenting this session with LLE pain and post op weakness/decreased activity tolerance. Pt completed functional mobility within room, toilet transfer, and standing grooming ADLs at RW level with MinGuard-MinA throughout. Currently requires ModA for LB ADLs secondary to LLE functional limitations. Pt lives with wife and teenage daughter who are available to provide 24hr assist PRN after return home.  Pt will benefit from continued acute OT services to maximize Pt's safety and independence with ADLs and mobility prior to discharge.     Follow Up Recommendations  DC plan and follow up therapy as arranged by surgeon;Supervision/Assistance - 24 hour    Equipment Recommendations  3 in 1 bedside commode           Precautions / Restrictions Precautions Precautions: Knee Precaution Booklet Issued: Yes (comment) Precaution Comments: positioning reviewed with pt Required Braces or Orthoses: Knee Immobilizer - Left Knee Immobilizer - Left: Other (comment)(until discontinued ) Restrictions Weight Bearing Restrictions: Yes LLE Weight Bearing: Weight bearing as tolerated      Mobility Bed Mobility Overal bed mobility: Needs Assistance Bed Mobility: Supine to Sit     Supine to sit: Supervision     General bed mobility comments: OOB in recliner upon arrival   Transfers Overall transfer level: Needs assistance Equipment used: Rolling walker (2 wheeled) Transfers: Sit to/from Stand Sit to Stand: Min guard         General transfer comment: increased time/effort, minguard for safety,  stood from recliner and BSC; Pt demonstrates safe hand placement throughout    Balance Overall balance assessment: Needs assistance Sitting-balance support: No upper extremity supported;Feet supported Sitting balance-Leahy Scale: Good     Standing balance support: Bilateral upper extremity supported;During functional activity;Single extremity supported Standing balance-Leahy Scale: Poor Standing balance comment: Reliant on UE support; utilizing single UE support during standing grooming ADLs                           ADL either performed or assessed with clinical judgement   ADL Overall ADL's : Needs assistance/impaired Eating/Feeding: Set up;Sitting   Grooming: Wash/dry face;Oral care;Min guard;Standing   Upper Body Bathing: Min guard;Sitting   Lower Body Bathing: Minimal assistance;Sit to/from stand   Upper Body Dressing : Set up;Sitting Upper Body Dressing Details (indicate cue type and reason): donning new gown  Lower Body Dressing: Moderate assistance;Sit to/from stand Lower Body Dressing Details (indicate cue type and reason): educated on compensatory techniques for completing task Toilet Transfer: Minimal assistance;Ambulation;BSC;RW Toilet Transfer Details (indicate cue type and reason): BSC over toilet  Toileting- Clothing Manipulation and Hygiene: Min guard;Sit to/from stand       Functional mobility during ADLs: Min guard;Minimal assistance;Rolling walker General ADL Comments: began education regarding safety and compensatory techniques for completing ADLs and functional mobility transfers                          Pertinent Vitals/Pain Pain Assessment: 0-10 Pain Score: 6  Faces Pain Scale: Hurts even more Pain Location: L knee  Pain Descriptors / Indicators: Aching;Grimacing;Guarding;Sore Pain Intervention(s):  Limited activity within patient's tolerance;Monitored during session;Repositioned;Ice applied     Hand Dominance Right    Extremity/Trunk Assessment Upper Extremity Assessment Upper Extremity Assessment: Overall WFL for tasks assessed   Lower Extremity Assessment Lower Extremity Assessment: Defer to PT evaluation   Cervical / Trunk Assessment Cervical / Trunk Assessment: Normal   Communication Communication Communication: No difficulties   Cognition Arousal/Alertness: Awake/alert Behavior During Therapy: WFL for tasks assessed/performed Overall Cognitive Status: Within Functional Limits for tasks assessed                                                      Home Living Family/patient expects to be discharged to:: Private residence Living Arrangements: Spouse/significant other Available Help at Discharge: Family;Available 24 hours/day Type of Home: House Home Access: Stairs to enter [employer] of Steps: 3 Entrance Stairs-Rails: None Home Layout: Multi-level;Able to live on main level with bedroom/bathroom     Bathroom Shower/Tub: Tub/shower unit;Walk-in shower   Bathroom Toilet: Standard     Home Equipment: None   Additional Comments: Pt reports plans to stay in guest bedroom on main level initially       Prior Functioning/Environment Level of Independence: Independent                 OT Problem List: Decreased strength;Impaired balance (sitting and/or standing);Pain;Decreased knowledge of use of DME or AE;Decreased activity tolerance      OT Treatment/Interventions: Self-care/ADL training;DME and/or AE instruction;Therapeutic activities;Balance training;Therapeutic exercise;Energy conservation;Patient/family education    OT Goals(Current goals can be found in the care plan section) Acute Rehab OT Goals Patient Stated Goal: to go home  OT Goal Formulation: With patient Time For Goal Achievement: 08/06/17 Potential to Achieve Goals: Good  OT Frequency: Min 2X/week                             AM-PAC PT "6 Clicks" Daily Activity      Outcome Measure Help from another person eating meals?: None Help from another person taking care of personal grooming?: A Little Help from another person toileting, which includes using toliet, bedpan, or urinal?: A Little Help from another person bathing (including washing, rinsing, drying)?: A Little Help from another person to put on and taking off regular upper body clothing?: None Help from another person to put on and taking off regular lower body clothing?: A Lot 6 Click Score: 19   End of Session Equipment Utilized During Treatment: Gait belt;Rolling walker;Left knee immobilizer Nurse Communication: Mobility status  Activity Tolerance: Patient tolerated treatment well Patient left: in chair;with call bell/phone within reach  OT Visit Diagnosis: Other abnormalities of gait and mobility (R26.89);Pain Pain - Right/Left: Left Pain - part of body: Knee                Time: 1420-1453 OT Time Calculation (min): 33 min Charges:  OT General Charges $OT Visit: 1 Visit OT Evaluation $OT Eval Low Complexity: 1 Low OT Treatments $Self Care/Home Management : 8-22 mins G-Codes:     Lou Cal, OT Pager [phone] 07/23/2017   Raymondo Band 07/23/2017, 3:32 PM

## 2017-07-24 MED ORDER — TIZANIDINE HCL 2 MG PO TABS: 2.0000 mg | ORAL_TABLET | Freq: Four times a day (QID) | ORAL | 0 refills | Status: DC | PRN

## 2017-07-24 MED ORDER — DOCUSATE SODIUM 100 MG PO CAPS: 100.0000 mg | ORAL_CAPSULE | Freq: Two times a day (BID) | ORAL | 0 refills | Status: DC

## 2017-07-24 MED ORDER — BISACODYL 5 MG PO TBEC: 5.0000 mg | DELAYED_RELEASE_TABLET | Freq: Every day | ORAL | 0 refills | Status: DC | PRN

## 2017-07-24 MED ORDER — HYDROCODONE-ACETAMINOPHEN 7.5-325 MG PO TABS: 1.0000 | ORAL_TABLET | ORAL | 0 refills | Status: DC | PRN

## 2017-07-24 NOTE — Progress Notes (Signed)
Discharge instructions and prescriptions reviewed with patient. Verbalized understanding. Iv removed, catheter tip intact. No questions/concerns at this time. Awaiting wife's arrival to transport her for discharge.

## 2017-07-24 NOTE — Care Management Note (Signed)
Case Management Note  Patient Details  Name: Lynisha Osuch MRN: [id number] Date of Birth: Jun 08, 1971  Subjective/Objective:  46 yr old female s/p left total knee arthroplasty.                    Action/Plan:  Patient was preoperatively setup with [facility], no changes. She will have family support at discharge.  Has all DME.    Expected Discharge Date:  07/24/17               Expected Discharge Plan:  [place]  In-House Referral:  NA  Discharge planning Services  CM Consult  Post Acute Care Choice:  Home Health, Durable Medical Equipment Choice offered to:  Patient  DME Arranged:  3-N-1, Walker rolling, CPM DME Agency:  [employer]  HH Arranged:  PT HH Agency:  NA  Status of Service:  Completed, signed off  If discussed at [place] of Stay Meetings, dates discussed:    Additional Comments:  Ninfa Meeker, RN 07/24/2017, 1:38 PM

## 2017-07-24 NOTE — Progress Notes (Signed)
Physical Therapy Treatment Patient Details Name: Raeshawn Tafolla MRN: [id number] DOB: [birth year] Today's Date: 07/24/2017    History of Present Illness Pt is a 46 y/o female s/p elective L TKA. PMH includes DM, anxiety/depression, DVT, and neuropathy in hands and feet.     PT Comments    Patient is making good progress with PT.  From a mobility standpoint anticipate patient will be ready for DC home when medically ready.    Follow Up Recommendations  DC plan and follow up therapy as arranged by surgeon;Supervision for mobility/OOB     Equipment Recommendations  Rolling walker with 5" wheels;3in1 (PT)    Recommendations for Other Services       Precautions / Restrictions Precautions Precautions: Knee Precaution Booklet Issued: Yes (comment) Precaution Comments: positioning reviewed with pt Required Braces or Orthoses: Knee Immobilizer - Left Knee Immobilizer - Left: Other (comment)(until discontinued ) Restrictions Weight Bearing Restrictions: Yes LLE Weight Bearing: Weight bearing as tolerated    Mobility  Bed Mobility Overal bed mobility: Modified Independent             General bed mobility comments: pt OOB in chair upon arrival  Transfers Overall transfer level: Needs assistance Equipment used: Rolling walker (2 wheeled) Transfers: Sit to/from Stand Sit to Stand: Supervision         General transfer comment: supervision for safety  Ambulation/Gait Ambulation/Gait assistance: Supervision Ambulation Distance (Feet): 180 Feet Assistive device: Rolling walker (2 wheeled) Gait Pattern/deviations: Decreased weight shift to left;Step-through pattern Gait velocity: Decreased   General Gait Details: cues for posture/forward gaze, L heel strike, and step length symmetry   Stairs Stairs: Yes   Stair Management: One rail Left;Step to pattern;Sideways;Forwards Number of Stairs: (4 steps X2) General stair comments: cues for sequencing and technique;  practiced sideways with L hand rail and forward with L hand rail and R HHA  Wheelchair Mobility    Modified Rankin (Stroke Patients Only)       Balance Overall balance assessment: Needs assistance Sitting-balance support: No upper extremity supported;Feet supported Sitting balance-Leahy Scale: Good     Standing balance support: Bilateral upper extremity supported;During functional activity Standing balance-Leahy Scale: Poor                              Cognition Arousal/Alertness: Awake/alert Behavior During Therapy: WFL for tasks assessed/performed Overall Cognitive Status: Within Functional Limits for tasks assessed                                        Exercises      General Comments        Pertinent Vitals/Pain Pain Assessment: Faces Pain Score: 8  Faces Pain Scale: Hurts little more Pain Location: L knee  Pain Descriptors / Indicators: Aching;Grimacing;Guarding Pain Intervention(s): Limited activity within patient's tolerance;Monitored during session;Premedicated before session;Repositioned;Ice applied    Home Living                      Prior Function            PT Goals (current goals can now be found in the care plan section) Acute Rehab PT Goals Patient Stated Goal: to go home  PT Goal Formulation: With patient Time For Goal Achievement: 07/29/17 Potential to Achieve Goals: Good Progress towards PT goals: Progressing toward goals    Frequency  7X/week      PT Plan Current plan remains appropriate    Co-evaluation              AM-PAC PT "6 Clicks" Daily Activity  Outcome Measure  Difficulty turning over in bed (including adjusting bedclothes, sheets and blankets)?: A Little Difficulty moving from lying on back to sitting on the side of the bed? : A Little Difficulty sitting down on and standing up from a chair with arms (e.g., wheelchair, bedside commode, etc,.)?: Unable Help needed moving  to and from a bed to chair (including a wheelchair)?: A Little Help needed walking in hospital room?: A Little Help needed climbing 3-5 steps with a railing? : A Little 6 Click Score: 16    End of Session Equipment Utilized During Treatment: Gait belt Activity Tolerance: Patient tolerated treatment well Patient left: with call bell/phone within reach;in bed;with family/visitor present Nurse Communication: Mobility status PT Visit Diagnosis: Other abnormalities of gait and mobility (R26.89);Pain Pain - Right/Left: Left Pain - part of body: Knee     Time: 1217-1237 PT Time Calculation (min) (ACUTE ONLY): 20 min  Charges:  $Gait Training: 8-22 mins                    G Codes:       Earney Navy, PTA Pager: [phone]     Darliss Cheney 07/24/2017, 1:24 PM

## 2017-07-24 NOTE — Discharge Summary (Signed)
Patient ID: [name] MRN: [id number] DOB/AGE: 47-29-1972 47 y.o.  Admit date: 07/22/2017 Discharge date: 07/24/2017  Admission Diagnoses:  Principal Problem:   Primary localized osteoarthritis of left knee Active Problems:   Primary osteoarthritis of left knee   Discharge Diagnoses:  Same  Past Medical History:  Diagnosis Date  . Anxiety and depression   . Arthritis   . Complication of anesthesia    has been hard to wake up post-op  . Dental crowns present   . Depression   . Diabetes ([employer])    resolved with gastric bypass  . DVT of lower extremity (deep venous thrombosis) ([place]) 2012   LLE  . Dysmenorrhea    hx endometriosis and fibroid  . Family history of breast cancer   . Headache(784.0)    tension or sinus  . History of MRSA infection prior to 2012   lasted 5-6 yr.  . Jaw snapping    states jaw pops  . Left foot drop 2016   --[place] [facility]  . Neuromuscular disorder ([place])    neuropathy in hands,feet,face  . Neuropathy 2015   hands, feet and face--[place] [facility]  . Prothrombin gene mutation ([place]) 2018   single gene mutation  . Right knee pain    hx meniscal injury, chondromalacia, OA  . Thyroid nodule     Surgeries: Procedure(s): TOTAL KNEE ARTHROPLASTY on 07/22/2017   Consultants:   Discharged Condition: Improved  Hospital Course: Avis Tirone is an 46 y.o. female who was admitted 07/22/2017 for operative treatment ofPrimary localized osteoarthritis of left knee. Patient has severe unremitting pain that affects sleep, daily activities, and work/hobbies. After pre-op clearance the patient was taken to the operating room on 07/22/2017 and underwent  Procedure(s): TOTAL KNEE ARTHROPLASTY.    Patient was given perioperative antibiotics:  Anti-infectives (From admission, onward)   Start     Dose/Rate Route Frequency Ordered Stop   07/22/17 1400  ceFAZolin (ANCEF) IVPB 2g/100 mL premix     2 g 200 mL/hr over 30 Minutes Intravenous Every 6  hours 07/22/17 1112 07/22/17 2040   07/22/17 0700  ceFAZolin (ANCEF) IVPB 2g/100 mL premix     2 g 200 mL/hr over 30 Minutes Intravenous To [facility] 07/21/17 1217 07/22/17 0800       Patient was given sequential compression devices, early ambulation, and chemoprophylaxis to prevent DVT.  Patient benefited maximally from hospital stay and there were no complications.    Recent vital signs:  Patient Vitals for the past 24 hrs:  BP Temp Temp src Pulse Resp SpO2  07/24/17 0549 (!) 148/81 98.6 F (37 C) Oral 97 18 95 %  07/23/17 2038 (!) 171/82 98.3 F (36.8 C) Oral 93 18 94 %  07/23/17 1348 (!) 155/83 97.6 F (36.4 C) Oral 79 18 100 %     Recent laboratory studies: No results for input(s): WBC, HGB, HCT, PLT, NA, K, CL, CO2, BUN, CREATININE, GLUCOSE, INR, CALCIUM in the last 72 hours.  Invalid input(s): PT, 2   Discharge Medications:   Allergies as of 07/24/2017      Reactions   Bactrim [sulfamethoxazole-trimethoprim] Other (See Comments)   HALLUCINATIONS FEVER CHILLS   Ibuprofen Other (See Comments)   Had gastric bypass. Told not to take NSAIDs.   Lamotrigine Hives   Sulfa Antibiotics Other (See Comments)   HALLUCINATIONS FEVER CHILLS      Medication List    TAKE these medications   Acetaminophen 167 MG/5ML Liqd Take by mouth every 8 (eight) hours  as needed (pain).   amoxicillin-clavulanate 875-125 MG tablet Commonly known as:  AUGMENTIN Take 1 tablet by mouth 2 (two) times daily. What changed:  additional instructions   bisacodyl 5 MG EC tablet Commonly known as:  DULCOLAX Take 1 tablet (5 mg total) by mouth daily as needed for moderate constipation.   CALCIUM 600 + D PO Take 1 tablet by mouth 2 (two) times daily.   docusate sodium 100 MG capsule Commonly known as:  COLACE Take 1 capsule (100 mg total) by mouth 2 (two) times daily.   DULoxetine 60 MG capsule Commonly known as:  CYMBALTA Take 120 mg by mouth every morning.    HYDROcodone-acetaminophen 7.5-325 MG tablet Commonly known as:  NORCO Take 1-2 tablets by mouth every 4 (four) hours as needed for moderate pain or severe pain.   LYRICA 25 MG capsule Generic drug:  pregabalin Take 25 mg by mouth 3 (three) times daily. Take 3 times daily   Melatonin 1 MG Subl Place 3 mg under the tongue at bedtime.   multivitamin tablet Take 1 tablet by mouth 2 (two) times daily.   norethindrone 5 MG tablet Commonly known as:  AYGESTIN Take 1 tablet (5 mg total) by mouth daily.   rivaroxaban 10 MG Tabs tablet Commonly known as:  XARELTO Take 1 tablet (10 mg total) by mouth daily.   tiZANidine 2 MG tablet Commonly known as:  ZANAFLEX Take 1-2 tablets (2-4 mg total) by mouth every 6 (six) hours as needed for muscle spasms.   Vitamin D (Ergocalciferol) 50000 units Caps capsule Commonly known as:  DRISDOL Take 1 capsule (50,000 Units total) by mouth every 7 (seven) days. What changed:  additional instructions            Durable Medical Equipment  (From admission, onward)        Start     Ordered   07/22/17 1113  DME Walker rolling  Once    Question:  Patient needs a walker to treat with the following condition  Answer:  Primary osteoarthritis of left knee   07/22/17 1112   07/22/17 1113  DME 3 n 1  Once     07/22/17 1112   07/22/17 1113  DME Bedside commode  Once    Question:  Patient needs a bedside commode to treat with the following condition  Answer:  Primary osteoarthritis of left knee   07/22/17 1112      Diagnostic Studies: Dg Chest 2 View  Result Date: 07/15/2017 CLINICAL DATA:  Preoperative exam. EXAM: CHEST  2 VIEW COMPARISON:  Chest x-ray 07/27/2013. FINDINGS: Mediastinum and hilar structures normal. Mild left base subsegmental atelectasis . No pleural effusion or pneumothorax. Heart size normal. No acute bony abnormality . IMPRESSION: No acute abnormality . Electronically Signed   By: Marcello Moores  Register   On: 07/15/2017 09:31     Disposition: 01-Home or Self Care  Discharge Instructions    Call MD / Call 911   Complete by:  As directed    If you experience chest pain or shortness of breath, CALL 911 and be transported to the hospital emergency room.  If you develope a fever above 101 F, pus (white drainage) or increased drainage or redness at the wound, or calf pain, call your surgeon's office.   Constipation Prevention   Complete by:  As directed    Drink plenty of fluids.  Prune juice may be helpful.  You may use a stool softener, such as Colace (over the counter)  100 mg twice a day.  Use MiraLax (over the counter) for constipation as needed.   Diet - low sodium heart healthy   Complete by:  As directed    Discharge instructions   Complete by:  As directed    INSTRUCTIONS AFTER JOINT REPLACEMENT   Remove items at home which could result in a fall. This includes throw rugs or furniture in walking pathways ICE to the affected joint every three hours while awake for 30 minutes at a time, for at least the first 3-5 days, and then as needed for pain and swelling.  Continue to use ice for pain and swelling. You may notice swelling that will progress down to the foot and ankle.  This is normal after surgery.  Elevate your leg when you are not up walking on it.   Continue to use the breathing machine you got in the hospital (incentive spirometer) which will help keep your temperature down.  It is common for your temperature to cycle up and down following surgery, especially at night when you are not up moving around and exerting yourself.  The breathing machine keeps your lungs expanded and your temperature down.   DIET:  As you were doing prior to hospitalization, we recommend a well-balanced diet.  DRESSING / WOUND CARE / SHOWERING  You may shower 3 days after surgery, but keep the wounds dry during showering.  You may use an occlusive plastic wrap (Press'n Seal for example), NO SOAKING/SUBMERGING IN THE BATHTUB.   If the bandage gets wet, change with a clean dry gauze.  If the incision gets wet, pat the wound dry with a clean towel.  ACTIVITY  Increase activity slowly as tolerated, but follow the weight bearing instructions below.   No driving for 6 weeks or until further direction given by your physician.  You cannot drive while taking narcotics.  No lifting or carrying greater than 10 lbs. until further directed by your surgeon. Avoid periods of inactivity such as sitting longer than an hour when not asleep. This helps prevent blood clots.  You may return to work once you are authorized by your doctor.     WEIGHT BEARING   Weight bearing as tolerated with assist device (walker, cane, etc) as directed, use it as long as suggested by your surgeon or therapist, typically at least 4-6 weeks.   EXERCISES  Results after joint replacement surgery are often greatly improved when you follow the exercise, range of motion and muscle strengthening exercises prescribed by your doctor. Safety measures are also important to protect the joint from further injury. Any time any of these exercises cause you to have increased pain or swelling, decrease what you are doing until you are comfortable again and then slowly increase them. If you have problems or questions, call your caregiver or physical therapist for advice.   Rehabilitation is important following a joint replacement. After just a few days of immobilization, the muscles of the leg can become weakened and shrink (atrophy).  These exercises are designed to build up the tone and strength of the thigh and leg muscles and to improve motion. Often times heat used for twenty to thirty minutes before working out will loosen up your tissues and help with improving the range of motion but do not use heat for the first two weeks following surgery (sometimes heat can increase post-operative swelling).   These exercises can be done on a training (exercise) mat, on the  floor, on a table or  on a bed. Use whatever works the best and is most comfortable for you.    Use music or television while you are exercising so that the exercises are a pleasant break in your day. This will make your life better with the exercises acting as a break in your routine that you can look forward to.   Perform all exercises about fifteen times, three times per day or as directed.  You should exercise both the operative leg and the other leg as well.   Exercises include:   Quad Sets - Tighten up the muscle on the front of the thigh (Quad) and hold for 5-10 seconds.   Straight Leg Raises - With your knee straight (if you were given a brace, keep it on), lift the leg to 60 degrees, hold for 3 seconds, and slowly lower the leg.  Perform this exercise against resistance later as your leg gets stronger.  Leg Slides: Lying on your back, slowly slide your foot toward your buttocks, bending your knee up off the floor (only go as far as is comfortable). Then slowly slide your foot back down until your leg is flat on the floor again.  Angel Wings: Lying on your back spread your legs to the side as far apart as you can without causing discomfort.  Hamstring Strength:  Lying on your back, push your heel against the floor with your leg straight by tightening up the muscles of your buttocks.  Repeat, but this time bend your knee to a comfortable angle, and push your heel against the floor.  You may put a pillow under the heel to make it more comfortable if necessary.   A rehabilitation program following joint replacement surgery can speed recovery and prevent re-injury in the future due to weakened muscles. Contact your doctor or a physical therapist for more information on knee rehabilitation.    CONSTIPATION  Constipation is defined medically as fewer than three stools per week and severe constipation as less than one stool per week.  Even if you have a regular bowel pattern at home, your normal  regimen is likely to be disrupted due to multiple reasons following surgery.  Combination of anesthesia, postoperative narcotics, change in appetite and fluid intake all can affect your bowels.   YOU MUST use at least one of the following options; they are listed in order of increasing strength to get the job done.  They are all available over the counter, and you may need to use some, POSSIBLY even all of these options:    Drink plenty of fluids (prune juice may be helpful) and high fiber foods Colace 100 mg by mouth twice a day  Senokot for constipation as directed and as needed Dulcolax (bisacodyl), take with full glass of water  Miralax (polyethylene glycol) once or twice a day as needed.  If you have tried all these things and are unable to have a bowel movement in the first 3-4 days after surgery call either your surgeon or your primary doctor.    If you experience loose stools or diarrhea, hold the medications until you stool forms back up.  If your symptoms do not get better within 1 week or if they get worse, check with your doctor.  If you experience "the worst abdominal pain ever" or develop nausea or vomiting, please contact the office immediately for further recommendations for treatment.   ITCHING:  If you experience itching with your medications, try taking only a single pain pill, or  even half a pain pill at a time.  You can also use Benadryl over the counter for itching or also to help with sleep.   TED HOSE STOCKINGS:  Use stockings on both legs until for at least 2 weeks or as directed by physician office. They may be removed at night for sleeping.  MEDICATIONS:  See your medication summary on the "After Visit Summary" that nursing will review with you.  You may have some home medications which will be placed on hold until you complete the course of blood thinner medication.  It is important for you to complete the blood thinner medication as prescribed.  PRECAUTIONS:  If you  experience chest pain or shortness of breath - call 911 immediately for transfer to the hospital emergency department.   If you develop a fever greater that 101 F, purulent drainage from wound, increased redness or drainage from wound, foul odor from the wound/dressing, or calf pain - CONTACT YOUR SURGEON.                                                   FOLLOW-UP APPOINTMENTS:  If you do not already have a post-op appointment, please call the office for an appointment to be seen by your surgeon.  Guidelines for how soon to be seen are listed in your "After Visit Summary", but are typically between 1-4 weeks after surgery.  OTHER INSTRUCTIONS:   Knee Replacement:  Do not place pillow under knee, focus on keeping the knee straight while resting. CPM instructions: 0-90 degrees, 2 hours in the morning, 2 hours in the afternoon, and 2 hours in the evening. Place foam block, curve side up under heel at all times except when in CPM or when walking.  DO NOT modify, tear, cut, or change the foam block in any way.  MAKE SURE YOU:  Understand these instructions.  Get help right away if you are not doing well or get worse.    Thank you for letting us be a part of your medical care team.  It is a privilege we respect greatly.  We hope these instructions will help you stay on track for a fast and full recovery!   Increase activity slowly as tolerated   Complete by:  As directed       Follow-up Information    Melrose Nakayama, MD. Schedule an appointment as soon as possible for a visit in 2 weeks.   Specialty:  Orthopedic Surgery Contact information: [place] [address] [phone]            Signed: Rich Fuchs 07/24/2017, 7:14 AM

## 2017-07-24 NOTE — Progress Notes (Signed)
OT Treatment Note  Completed education regarding ADL and functional mobility. Educated pt on use of ice for pain control Pt safe to DC home when medically stable.     07/24/17 1000  OT Visit Information  Last OT Received On 07/24/17  Assistance Needed +1  History of Present Illness Pt is a 46 y/o female s/p elective L TKA. PMH includes DM, anxiety/depression, DVT, and neuropathy in hands and feet.   Precautions  Precautions Knee  Precaution Comments positioning reviewed with pt  Required Braces or Orthoses Knee Immobilizer - Left  Pain Assessment  Pain Assessment 0-10  Pain Score 8  Pain Location L knee   Pain Descriptors / Indicators Aching;Grimacing;Guarding;Sore  Pain Intervention(s) Limited activity within patient's tolerance;Repositioned;Ice applied  Cognition  Arousal/Alertness Awake/alert  Behavior During Therapy WFL for tasks assessed/performed  Overall Cognitive Status Within Functional Limits for tasks assessed  ADL  Toilet Transfer Supervision/safety;RW;BSC  Functional mobility during ADLs Supervision/safety;Rolling walker  General ADL Comments Adjusted RW and BSC to appropriate height. Completed education regarding shower transfer using BSC. Copleted educaiton regaridng compensatory technqiues for ADL. PT able to return demosntrate.  Bed Mobility  Overal bed mobility Modified Independent  Restrictions  LLE Weight Bearing WBAT  Transfers  Overall transfer level Needs assistance  Equipment used Rolling walker (2 wheeled)  Sit to Stand Supervision  OT - End of Session  Equipment Utilized During Treatment Gait belt;Rolling walker;Left knee immobilizer  Activity Tolerance Patient tolerated treatment well  Patient left in chair;with call bell/phone within reach  Nurse Communication Mobility status  OT Assessment/Plan  OT Plan Discharge plan remains appropriate  OT Visit Diagnosis Other abnormalities of gait and mobility (R26.89);Pain  Pain - Right/Left Left  Pain -  part of body Knee  OT Frequency (ACUTE ONLY) Min 2X/week  Follow Up Recommendations DC plan and follow up therapy as arranged by surgeon;Supervision/Assistance - 24 hour  OT Equipment 3 in 1 bedside commode  AM-PAC OT "6 Clicks" Daily Activity Outcome Measure  Help from another person eating meals? 4  Help from another person taking care of personal grooming? 4  Help from another person toileting, which includes using toliet, bedpan, or urinal? 3  Help from another person bathing (including washing, rinsing, drying)? 3  Help from another person to put on and taking off regular upper body clothing? 4  Help from another person to put on and taking off regular lower body clothing? 3  6 Click Score 21  ADL G Code Conversion CJ  OT Goal Progression  Progress towards OT goals Progressing toward goals  Acute Rehab OT Goals  Patient Stated Goal to go home   OT Goal Formulation With patient  Time For Goal Achievement 08/06/17  Potential to Achieve Goals Good  ADL Goals  Pt Will Perform Toileting - Clothing Manipulation and hygiene with modified independence;sit to/from stand  Pt Will Perform Grooming with modified independence;standing  Pt Will Perform Lower Body Dressing sit to/from stand;with min guard assist  Pt Will Transfer to Toilet ambulating;bedside commode;with modified independence  Pt Will Perform Tub/Shower Transfer Shower transfer;with min guard assist;ambulating;3 in 1;rolling walker  Pt Will Perform Lower Body Bathing with min guard assist;sit to/from stand  OT Time Calculation  OT Start Time (ACUTE ONLY) 1020  OT Stop Time (ACUTE ONLY) 1045  OT Time Calculation (min) 25 min  OT General Charges  $OT Visit 1 Visit  OT Treatments  $Self Care/Home Management  23-37 mins  [facility], OT/L  [phone] 07/24/2017

## 2017-07-24 NOTE — Progress Notes (Signed)
Subjective: 2 Days Post-Op Procedure(s) (LRB): TOTAL KNEE ARTHROPLASTY (Left)  Activity level:  wbat Diet tolerance:  ok Voiding:  ok Patient reports pain as mild.    Objective: Vital signs in last 24 hours: Temp:  [97.6 F (36.4 C)-98.6 F (37 C)] 98.6 F (37 C) (12/13 0549) Pulse Rate:  [79-97] 97 (12/13 0549) Resp:  [18] 18 (12/13 0549) BP: (148-171)/(81-83) 148/81 (12/13 0549) SpO2:  [94 %-100 %] 95 % (12/13 0549)  Labs: No results for input(s): HGB in the last 72 hours. No results for input(s): WBC, RBC, HCT, PLT in the last 72 hours. No results for input(s): NA, K, CL, CO2, BUN, CREATININE, GLUCOSE, CALCIUM in the last 72 hours. No results for input(s): LABPT, INR in the last 72 hours.  Physical Exam:  Neurologically intact ABD soft Neurovascular intact Sensation intact distally Intact pulses distally Dorsiflexion/Plantar flexion intact Incision: dressing C/D/I and no drainage No cellulitis present Compartment soft  Assessment/Plan:  2 Days Post-Op Procedure(s) (LRB): TOTAL KNEE ARTHROPLASTY (Left) Advance diet Up with therapy Discharge home with home health today after PT if doing well. Continue on Xarelto 10mg  daily for 3 weeks for DVT prevention. Follow up in office 2 weeks post op.  [name], Larwance Sachs 07/24/2017, 7:14 AM

## 2017-07-25 DIAGNOSIS — Z96652 Presence of left artificial knee joint: Secondary | ICD-10-CM | POA: Diagnosis not present

## 2017-07-25 DIAGNOSIS — M1712 Unilateral primary osteoarthritis, left knee: Secondary | ICD-10-CM | POA: Diagnosis not present

## 2017-07-25 DIAGNOSIS — G5732 Lesion of lateral popliteal nerve, left lower limb: Secondary | ICD-10-CM | POA: Diagnosis not present

## 2017-07-25 DIAGNOSIS — M21372 Foot drop, left foot: Secondary | ICD-10-CM | POA: Diagnosis not present

## 2017-07-25 DIAGNOSIS — Z9884 Bariatric surgery status: Secondary | ICD-10-CM | POA: Diagnosis not present

## 2017-07-25 DIAGNOSIS — Z471 Aftercare following joint replacement surgery: Secondary | ICD-10-CM | POA: Diagnosis not present

## 2017-07-25 DIAGNOSIS — Z6835 Body mass index (BMI) 35.0-35.9, adult: Secondary | ICD-10-CM | POA: Diagnosis not present

## 2017-07-25 DIAGNOSIS — E669 Obesity, unspecified: Secondary | ICD-10-CM | POA: Diagnosis not present

## 2017-07-25 DIAGNOSIS — M1711 Unilateral primary osteoarthritis, right knee: Secondary | ICD-10-CM | POA: Diagnosis not present

## 2017-07-29 DIAGNOSIS — M1711 Unilateral primary osteoarthritis, right knee: Secondary | ICD-10-CM | POA: Diagnosis not present

## 2017-07-29 DIAGNOSIS — M21372 Foot drop, left foot: Secondary | ICD-10-CM | POA: Diagnosis not present

## 2017-07-29 DIAGNOSIS — Z471 Aftercare following joint replacement surgery: Secondary | ICD-10-CM | POA: Diagnosis not present

## 2017-07-29 DIAGNOSIS — E669 Obesity, unspecified: Secondary | ICD-10-CM | POA: Diagnosis not present

## 2017-07-29 DIAGNOSIS — Z9884 Bariatric surgery status: Secondary | ICD-10-CM | POA: Diagnosis not present

## 2017-07-29 DIAGNOSIS — Z96652 Presence of left artificial knee joint: Secondary | ICD-10-CM | POA: Diagnosis not present

## 2017-07-29 DIAGNOSIS — Z6835 Body mass index (BMI) 35.0-35.9, adult: Secondary | ICD-10-CM | POA: Diagnosis not present

## 2017-07-29 DIAGNOSIS — G5732 Lesion of lateral popliteal nerve, left lower limb: Secondary | ICD-10-CM | POA: Diagnosis not present

## 2017-07-30 DIAGNOSIS — M1711 Unilateral primary osteoarthritis, right knee: Secondary | ICD-10-CM | POA: Diagnosis not present

## 2017-07-30 DIAGNOSIS — Z96652 Presence of left artificial knee joint: Secondary | ICD-10-CM | POA: Diagnosis not present

## 2017-07-30 DIAGNOSIS — G5732 Lesion of lateral popliteal nerve, left lower limb: Secondary | ICD-10-CM | POA: Diagnosis not present

## 2017-07-30 DIAGNOSIS — E669 Obesity, unspecified: Secondary | ICD-10-CM | POA: Diagnosis not present

## 2017-07-30 DIAGNOSIS — Z471 Aftercare following joint replacement surgery: Secondary | ICD-10-CM | POA: Diagnosis not present

## 2017-07-30 DIAGNOSIS — Z6835 Body mass index (BMI) 35.0-35.9, adult: Secondary | ICD-10-CM | POA: Diagnosis not present

## 2017-07-30 DIAGNOSIS — M21372 Foot drop, left foot: Secondary | ICD-10-CM | POA: Diagnosis not present

## 2017-07-30 DIAGNOSIS — Z9884 Bariatric surgery status: Secondary | ICD-10-CM | POA: Diagnosis not present

## 2017-08-01 DIAGNOSIS — M21372 Foot drop, left foot: Secondary | ICD-10-CM | POA: Diagnosis not present

## 2017-08-01 DIAGNOSIS — Z96652 Presence of left artificial knee joint: Secondary | ICD-10-CM | POA: Diagnosis not present

## 2017-08-01 DIAGNOSIS — Z9884 Bariatric surgery status: Secondary | ICD-10-CM | POA: Diagnosis not present

## 2017-08-01 DIAGNOSIS — Z471 Aftercare following joint replacement surgery: Secondary | ICD-10-CM | POA: Diagnosis not present

## 2017-08-01 DIAGNOSIS — Z6835 Body mass index (BMI) 35.0-35.9, adult: Secondary | ICD-10-CM | POA: Diagnosis not present

## 2017-08-01 DIAGNOSIS — G5732 Lesion of lateral popliteal nerve, left lower limb: Secondary | ICD-10-CM | POA: Diagnosis not present

## 2017-08-01 DIAGNOSIS — M1711 Unilateral primary osteoarthritis, right knee: Secondary | ICD-10-CM | POA: Diagnosis not present

## 2017-08-01 DIAGNOSIS — M1712 Unilateral primary osteoarthritis, left knee: Secondary | ICD-10-CM | POA: Diagnosis not present

## 2017-08-01 DIAGNOSIS — E669 Obesity, unspecified: Secondary | ICD-10-CM | POA: Diagnosis not present

## 2017-08-07 DIAGNOSIS — Z96652 Presence of left artificial knee joint: Secondary | ICD-10-CM | POA: Diagnosis not present

## 2017-08-07 DIAGNOSIS — R262 Difficulty in walking, not elsewhere classified: Secondary | ICD-10-CM | POA: Diagnosis not present

## 2017-08-07 DIAGNOSIS — M25562 Pain in left knee: Secondary | ICD-10-CM | POA: Diagnosis not present

## 2017-08-07 DIAGNOSIS — M25662 Stiffness of left knee, not elsewhere classified: Secondary | ICD-10-CM | POA: Diagnosis not present

## 2017-08-08 ENCOUNTER — Ambulatory Visit: Payer: BLUE CROSS/BLUE SHIELD | Admitting: Obstetrics and Gynecology

## 2017-08-08 DIAGNOSIS — M25562 Pain in left knee: Secondary | ICD-10-CM | POA: Diagnosis not present

## 2017-08-08 DIAGNOSIS — R262 Difficulty in walking, not elsewhere classified: Secondary | ICD-10-CM | POA: Diagnosis not present

## 2017-08-08 DIAGNOSIS — M25662 Stiffness of left knee, not elsewhere classified: Secondary | ICD-10-CM | POA: Diagnosis not present

## 2017-08-08 DIAGNOSIS — Z96652 Presence of left artificial knee joint: Secondary | ICD-10-CM | POA: Diagnosis not present

## 2017-08-11 DIAGNOSIS — M25562 Pain in left knee: Secondary | ICD-10-CM | POA: Diagnosis not present

## 2017-08-11 DIAGNOSIS — Z96652 Presence of left artificial knee joint: Secondary | ICD-10-CM | POA: Diagnosis not present

## 2017-08-11 DIAGNOSIS — M25662 Stiffness of left knee, not elsewhere classified: Secondary | ICD-10-CM | POA: Diagnosis not present

## 2017-08-11 DIAGNOSIS — R262 Difficulty in walking, not elsewhere classified: Secondary | ICD-10-CM | POA: Diagnosis not present

## 2017-08-13 ENCOUNTER — Encounter: Payer: Self-pay | Admitting: Family Medicine

## 2017-08-13 DIAGNOSIS — M25662 Stiffness of left knee, not elsewhere classified: Secondary | ICD-10-CM | POA: Diagnosis not present

## 2017-08-13 DIAGNOSIS — Z96652 Presence of left artificial knee joint: Secondary | ICD-10-CM | POA: Diagnosis not present

## 2017-08-13 DIAGNOSIS — M25562 Pain in left knee: Secondary | ICD-10-CM | POA: Diagnosis not present

## 2017-08-13 DIAGNOSIS — R262 Difficulty in walking, not elsewhere classified: Secondary | ICD-10-CM | POA: Diagnosis not present

## 2017-08-15 ENCOUNTER — Encounter: Payer: Self-pay | Admitting: Hematology & Oncology

## 2017-08-15 DIAGNOSIS — R262 Difficulty in walking, not elsewhere classified: Secondary | ICD-10-CM | POA: Diagnosis not present

## 2017-08-15 DIAGNOSIS — M25562 Pain in left knee: Secondary | ICD-10-CM | POA: Diagnosis not present

## 2017-08-15 DIAGNOSIS — M25662 Stiffness of left knee, not elsewhere classified: Secondary | ICD-10-CM | POA: Diagnosis not present

## 2017-08-15 DIAGNOSIS — Z96652 Presence of left artificial knee joint: Secondary | ICD-10-CM | POA: Diagnosis not present

## 2017-08-18 DIAGNOSIS — Z96652 Presence of left artificial knee joint: Secondary | ICD-10-CM | POA: Diagnosis not present

## 2017-08-18 DIAGNOSIS — R262 Difficulty in walking, not elsewhere classified: Secondary | ICD-10-CM | POA: Diagnosis not present

## 2017-08-18 DIAGNOSIS — M25562 Pain in left knee: Secondary | ICD-10-CM | POA: Diagnosis not present

## 2017-08-18 DIAGNOSIS — M25662 Stiffness of left knee, not elsewhere classified: Secondary | ICD-10-CM | POA: Diagnosis not present

## 2017-08-21 ENCOUNTER — Encounter: Payer: Self-pay | Admitting: Family Medicine

## 2017-08-22 ENCOUNTER — Ambulatory Visit: Payer: BLUE CROSS/BLUE SHIELD | Admitting: Obstetrics and Gynecology

## 2017-08-22 DIAGNOSIS — Z96652 Presence of left artificial knee joint: Secondary | ICD-10-CM | POA: Diagnosis not present

## 2017-08-22 DIAGNOSIS — M25662 Stiffness of left knee, not elsewhere classified: Secondary | ICD-10-CM | POA: Diagnosis not present

## 2017-08-22 DIAGNOSIS — R262 Difficulty in walking, not elsewhere classified: Secondary | ICD-10-CM | POA: Diagnosis not present

## 2017-08-22 DIAGNOSIS — M25562 Pain in left knee: Secondary | ICD-10-CM | POA: Diagnosis not present

## 2017-08-27 DIAGNOSIS — M25562 Pain in left knee: Secondary | ICD-10-CM | POA: Diagnosis not present

## 2017-08-27 DIAGNOSIS — R262 Difficulty in walking, not elsewhere classified: Secondary | ICD-10-CM | POA: Diagnosis not present

## 2017-08-27 DIAGNOSIS — M25662 Stiffness of left knee, not elsewhere classified: Secondary | ICD-10-CM | POA: Diagnosis not present

## 2017-08-27 DIAGNOSIS — Z96652 Presence of left artificial knee joint: Secondary | ICD-10-CM | POA: Diagnosis not present

## 2017-09-02 DIAGNOSIS — M25562 Pain in left knee: Secondary | ICD-10-CM | POA: Diagnosis not present

## 2017-09-02 DIAGNOSIS — Z96652 Presence of left artificial knee joint: Secondary | ICD-10-CM | POA: Diagnosis not present

## 2017-09-02 DIAGNOSIS — R262 Difficulty in walking, not elsewhere classified: Secondary | ICD-10-CM | POA: Diagnosis not present

## 2017-09-03 DIAGNOSIS — M25562 Pain in left knee: Secondary | ICD-10-CM | POA: Diagnosis not present

## 2017-09-03 DIAGNOSIS — Z96652 Presence of left artificial knee joint: Secondary | ICD-10-CM | POA: Diagnosis not present

## 2017-09-05 DIAGNOSIS — R262 Difficulty in walking, not elsewhere classified: Secondary | ICD-10-CM | POA: Diagnosis not present

## 2017-09-05 DIAGNOSIS — Z96652 Presence of left artificial knee joint: Secondary | ICD-10-CM | POA: Diagnosis not present

## 2017-09-05 DIAGNOSIS — M25562 Pain in left knee: Secondary | ICD-10-CM | POA: Diagnosis not present

## 2017-09-05 DIAGNOSIS — M25662 Stiffness of left knee, not elsewhere classified: Secondary | ICD-10-CM | POA: Diagnosis not present

## 2017-09-16 DIAGNOSIS — R262 Difficulty in walking, not elsewhere classified: Secondary | ICD-10-CM | POA: Diagnosis not present

## 2017-09-16 DIAGNOSIS — Z96652 Presence of left artificial knee joint: Secondary | ICD-10-CM | POA: Diagnosis not present

## 2017-09-16 DIAGNOSIS — M25562 Pain in left knee: Secondary | ICD-10-CM | POA: Diagnosis not present

## 2017-09-16 DIAGNOSIS — M25662 Stiffness of left knee, not elsewhere classified: Secondary | ICD-10-CM | POA: Diagnosis not present

## 2017-09-18 DIAGNOSIS — R262 Difficulty in walking, not elsewhere classified: Secondary | ICD-10-CM | POA: Diagnosis not present

## 2017-09-18 DIAGNOSIS — M25662 Stiffness of left knee, not elsewhere classified: Secondary | ICD-10-CM | POA: Diagnosis not present

## 2017-09-18 DIAGNOSIS — M25562 Pain in left knee: Secondary | ICD-10-CM | POA: Diagnosis not present

## 2017-09-18 DIAGNOSIS — Z96652 Presence of left artificial knee joint: Secondary | ICD-10-CM | POA: Diagnosis not present

## 2017-09-19 ENCOUNTER — Encounter: Payer: Self-pay | Admitting: Obstetrics and Gynecology

## 2017-09-19 ENCOUNTER — Ambulatory Visit (INDEPENDENT_AMBULATORY_CARE_PROVIDER_SITE_OTHER): Payer: BLUE CROSS/BLUE SHIELD | Admitting: Obstetrics and Gynecology

## 2017-09-19 ENCOUNTER — Other Ambulatory Visit (HOSPITAL_COMMUNITY)
Admission: RE | Admit: 2017-09-19 | Discharge: 2017-09-19 | Disposition: A | Discharge status: Home / Self Care | Payer: BLUE CROSS/BLUE SHIELD | Source: Ambulatory Visit | Attending: Obstetrics and Gynecology | Admitting: Obstetrics and Gynecology

## 2017-09-19 ENCOUNTER — Other Ambulatory Visit: Payer: Self-pay

## 2017-09-19 ENCOUNTER — Telehealth: Payer: Self-pay

## 2017-09-19 VITALS — BP 122/64 | HR 84 | Resp 16 | Ht 68.0 in | Wt 237.0 lb

## 2017-09-19 DIAGNOSIS — Z01419 Encounter for gynecological examination (general) (routine) without abnormal findings: Secondary | ICD-10-CM | POA: Diagnosis not present

## 2017-09-19 DIAGNOSIS — Z862 Personal history of diseases of the blood and blood-forming organs and certain disorders involving the immune mechanism: Secondary | ICD-10-CM | POA: Diagnosis not present

## 2017-09-19 DIAGNOSIS — N6452 Nipple discharge: Secondary | ICD-10-CM

## 2017-09-19 NOTE — Progress Notes (Addendum)
47 y.o. [id number] Married Caucasian female here for annual exam.    Last [place] 05/2017 showing fibroids.  Has dysmenorrhea and abnormal uterine bleeding.  November cycle lasted 11 days. Next cycle 42 days later.  3 days.  No cycle since then.  Has random cramps.   No hot flashes.   Had iron infusion last year at the end of the year.   Reports breast discharge.  This is spontaneous once a week.  Also expresses it when it feels full.  This is chronic for 9 years.  Has seen a breast surgeon in [place] prior to moving to [place].  Normal prolactin on 04/26/14.  Normal TSH 12/09/16.  Had knee surgery.   Mother just died 2 weeks ago from renal failure.  She had recurrent breast cancer also.   PCP:   Dr. Colin Benton  Patient's last menstrual period was 08/07/2017.           Sexually active: Yes.    The current method of family planning is none -- female partner.    Exercising: No.  Exercise is limited by orthopedic condition(s): knee surgery. Smoker:  no  Health Maintenance: Pap:  04-29-14 Neg:neg HR HPV, 04-23-13 Neg History of abnormal Pap:  no MMG:  [place] Right Breast - 08/06/16 BIRADS 2 benign/density b.  She will schedule. Colonoscopy:  04/10/17 Normal f/u 10 years TDaP:  2014 HIV and Hep C: 05/19/15 Negative Screening Labs:  PCP   reports that  has never smoked. she has never used smokeless tobacco. She reports that she drinks about 1.2 - 2.4 oz of alcohol per week. She reports that she does not use drugs.  Past Medical History:  Diagnosis Date  . Anxiety and depression   . Arthritis   . Complication of anesthesia    has been hard to wake up post-op  . Dental crowns present   . Depression   . Diabetes ([place])    resolved with gastric bypass  . DVT of lower extremity (deep venous thrombosis) ([place]) 2012   LLE  . Dysmenorrhea    hx endometriosis and fibroid  . Family history of breast cancer   . Headache(784.0)    tension or sinus  . History of MRSA infection prior to 2012    lasted 5-6 yr.  . Hx of nipple discharge    Bilateral.  Long standing.  Negative work up.   . Jaw snapping    states jaw pops  . Left foot drop 2016   --[facility]  . Neuromuscular disorder ([place])    neuropathy in hands,feet,face  . Neuropathy 2015   hands, feet and face--[facility]  . Prothrombin gene mutation ([place]) 2018   single gene mutation  . Right knee pain    hx meniscal injury, chondromalacia, OA  . Thyroid nodule     Past Surgical History:  Procedure Laterality Date  . CHOLECYSTECTOMY  1990's  . DILITATION & CURRETTAGE/HYSTROSCOPY WITH NOVASURE ABLATION N/A 05/30/2014   Procedure: DILATATION & CURETTAGE/HYSTEROSCOPY WITH attempted [place] and with IUD removal ;  Surgeon: Jamey Reas de Berton Lan, MD;  Location: [facility];  Service: Gynecology;  Laterality: N/A;  . GASTRIC ROUX-EN-Y N/A 11/02/2013   Procedure: LAPAROSCOPIC ROUX-EN-Y GASTRIC BYPASS WITH UPPER ENDOSCOPY;  Surgeon: Gayland Curry, MD;  Location: [facility];  Service: General;  Laterality: N/A;  . HAND SURGERY Left    X 3 - spider bite and MRSA infection  . HYSTEROSCOPY WITH RESECTOSCOPE N/A 05/30/2014  Procedure: HYSTEROSCOPY WITH RESECTOSCOPE;  Surgeon: Jamey Reas de Berton Lan, MD;  Location: [facility];  Service: Gynecology;  Laterality: N/A;  . KNEE ARTHROSCOPY Right 12/29/2012   Procedure: RIGHT KNEE ARTHROSCOPY  PARTIAL MEDIAL AND LATERAL MENISECTOMY WITH CHONDROPLASTY;  Surgeon: Hessie Dibble, MD;  Location: [facility];  Service: Orthopedics;  Laterality: Right;  . PELVIC LAPAROSCOPY  1990's   d/t endometriosis  . TONSILLECTOMY  as child  . TOTAL KNEE ARTHROPLASTY Left 07/22/2017  . TOTAL KNEE ARTHROPLASTY Left 07/22/2017   Procedure: TOTAL KNEE ARTHROPLASTY;  Surgeon: Melrose Nakayama, MD;  Location: [facility];  Service: Orthopedics;  Laterality: Left;  . WRIST SURGERY Right 2005    Current Outpatient Medications  Medication Sig Dispense Refill  .  Acetaminophen 167 MG/5ML LIQD Take by mouth every 8 (eight) hours as needed (pain).    . Calcium Carb-Cholecalciferol (CALCIUM 600 + D PO) Take 1 tablet by mouth 2 (two) times daily.    . DULoxetine (CYMBALTA) 60 MG capsule Take 120 mg by mouth every morning.     Marland Kitchen LYRICA 25 MG capsule Take 25 mg by mouth 3 (three) times daily. Take 3 times daily    . Melatonin 1 MG SUBL Place 3 mg under the tongue at bedtime. 1 tablet 2  . Multiple Vitamin (MULTIVITAMIN) tablet Take 1 tablet by mouth 2 (two) times daily.     . Vitamin D, Ergocalciferol, (DRISDOL) 50000 units CAPS capsule Take 1 capsule (50,000 Units total) by mouth every 7 (seven) days. (Patient not taking: Reported on 09/19/2017) 12 capsule 0   No current facility-administered medications for this visit.     Family History  Problem Relation Age of Onset  . Diabetes Mother        Living  . Kidney disease Mother   . Hypertension Mother   . Breast cancer Mother 67       dx'd with breast ca x2; 2nd around 34  . Diabetes Maternal Grandmother 8       dec  . Breast cancer Maternal Grandmother        dx in her 55s  . Arthritis Father   . Healthy Sister   . Healthy Sister   . Diabetes Maternal Grandfather   . Stroke Maternal Grandfather   . Healthy Daughter   . Breast cancer Other        2 great aunts  . Thyroid disease Neg Hx     ROS:  Pertinent items are noted in HPI.  Otherwise, a comprehensive ROS was negative.  Exam:   BP 122/64 (BP Location: Right Arm, Patient Position: Sitting, Cuff Size: Large)   Pulse 84   Resp 16   Ht 5\' 8"  (1.727 m)   Wt 237 lb (107.5 kg)   LMP 08/07/2017   BMI 36.04 kg/m     General appearance: alert, cooperative and appears stated age Head: Normocephalic, without obvious abnormality, atraumatic Neck: no adenopathy, supple, symmetrical, trachea midline and thyroid normal to inspection and palpation Lungs: clear to auscultation bilaterally Breasts: normal appearance, no masses or tenderness, No  nipple retraction or dimpling, No nipple discharge or bleeding, No axillary or supraclavicular adenopathy Heart: regular rate and rhythm Abdomen: soft, non-tender; no masses, no organomegaly Extremities: extremities normal, atraumatic, no cyanosis or edema Skin: Skin color, texture, turgor normal. No rashes or lesions Lymph nodes: Cervical, supraclavicular, and axillary nodes normal. No abnormal inguinal nodes palpated Neurologic: Grossly normal  Pelvic: External genitalia:  no lesions  Urethra:  normal appearing urethra with no masses, tenderness or lesions              Bartholins and Skenes: normal                 Vagina: normal appearing vagina with normal color and discharge, no lesions              Cervix: no lesions              Pap taken: Yes.   Bimanual Exam:  Uterus:  normal size, contour, position, consistency, mobility, non-tender              Adnexa: no mass, fullness, tenderness              Rectal exam: Yes.  .  Confirms.              Anus:  normal sphincter tone, no lesions  Chaperone was present for exam.  Assessment:   Well woman visit with normal exam. Dysmenorrhea.  Irregular menses.  I think this is anovulatory bleeding.  Patient may be perimenopausal now.  Status post endometrial ablation.  Prothrombin gene mutation.  Hx DVT. Bereavement.  Hx bilateral nipple discharge with negative work up.   Plan: Mammogram screening discussed. Recommended self breast awareness. Pap and HR HPV as above. Guidelines for Calcium, Vitamin D, regular exercise program including cardiovascular and weight bearing exercise. Will check CBC, iron studies, and ferritin.  Bereavement support. I recommended Hospice counseling for the patient.  I gave her parameters for monitoring her cycles.  No intervention is needed at this time.  Follow up annually and prn.   After visit summary provided.

## 2017-09-19 NOTE — Telephone Encounter (Signed)
Opened in error

## 2017-09-19 NOTE — Patient Instructions (Signed)

## 2017-09-20 LAB — IRON AND TIBC
Iron Saturation: 25 % (ref 15–55)
Iron: 83 ug/dL (ref 27–159)
Total Iron Binding Capacity: 336 ug/dL (ref 250–450)
UIBC: 253 ug/dL (ref 131–425)

## 2017-09-20 LAB — CBC
HEMATOCRIT: 42.1 % (ref 34.0–46.6)
Hemoglobin: 13.3 g/dL (ref 11.1–15.9)
MCH: 28.8 pg (ref 26.6–33.0)
MCHC: 31.6 g/dL (ref 31.5–35.7)
MCV: 91 fL (ref 79–97)
PLATELETS: 380 10*3/uL — AB (ref 150–379)
RBC: 4.62 x10E6/uL (ref 3.77–5.28)
RDW: 13.7 % (ref 12.3–15.4)
WBC: 8.1 10*3/uL (ref 3.4–10.8)

## 2017-09-20 LAB — FERRITIN: Ferritin: 122 ng/mL (ref 15–150)

## 2017-09-22 DIAGNOSIS — Z471 Aftercare following joint replacement surgery: Secondary | ICD-10-CM | POA: Diagnosis not present

## 2017-09-23 LAB — CYTOLOGY - PAP
DIAGNOSIS: NEGATIVE
HPV: NOT DETECTED

## 2017-10-02 DIAGNOSIS — F410 Panic disorder [episodic paroxysmal anxiety] without agoraphobia: Secondary | ICD-10-CM | POA: Diagnosis not present

## 2017-10-02 DIAGNOSIS — F3342 Major depressive disorder, recurrent, in full remission: Secondary | ICD-10-CM | POA: Diagnosis not present

## 2017-10-13 ENCOUNTER — Encounter: Payer: Self-pay | Admitting: Family Medicine

## 2017-10-20 DIAGNOSIS — M1712 Unilateral primary osteoarthritis, left knee: Secondary | ICD-10-CM | POA: Diagnosis not present

## 2017-10-22 ENCOUNTER — Encounter: Payer: Self-pay | Admitting: Family

## 2017-10-27 DIAGNOSIS — M25549 Pain in joints of unspecified hand: Secondary | ICD-10-CM | POA: Diagnosis not present

## 2017-10-27 DIAGNOSIS — M7062 Trochanteric bursitis, left hip: Secondary | ICD-10-CM | POA: Diagnosis not present

## 2017-10-27 DIAGNOSIS — R682 Dry mouth, unspecified: Secondary | ICD-10-CM | POA: Diagnosis not present

## 2017-10-27 DIAGNOSIS — D500 Iron deficiency anemia secondary to blood loss (chronic): Secondary | ICD-10-CM | POA: Diagnosis not present

## 2017-10-28 ENCOUNTER — Other Ambulatory Visit: Payer: Self-pay | Admitting: Family

## 2017-10-28 DIAGNOSIS — L309 Dermatitis, unspecified: Secondary | ICD-10-CM | POA: Diagnosis not present

## 2017-10-28 DIAGNOSIS — D500 Iron deficiency anemia secondary to blood loss (chronic): Secondary | ICD-10-CM | POA: Diagnosis not present

## 2017-10-28 DIAGNOSIS — R200 Anesthesia of skin: Secondary | ICD-10-CM | POA: Diagnosis not present

## 2017-10-28 DIAGNOSIS — M25549 Pain in joints of unspecified hand: Secondary | ICD-10-CM | POA: Diagnosis not present

## 2017-10-28 DIAGNOSIS — N921 Excessive and frequent menstruation with irregular cycle: Secondary | ICD-10-CM

## 2017-10-28 DIAGNOSIS — R202 Paresthesia of skin: Secondary | ICD-10-CM | POA: Diagnosis not present

## 2017-10-29 ENCOUNTER — Other Ambulatory Visit: Payer: Self-pay

## 2017-10-29 ENCOUNTER — Inpatient Hospital Stay: Payer: BLUE CROSS/BLUE SHIELD

## 2017-10-29 ENCOUNTER — Encounter: Payer: Self-pay | Admitting: Family

## 2017-10-29 ENCOUNTER — Inpatient Hospital Stay: Payer: BLUE CROSS/BLUE SHIELD | Attending: Family | Admitting: Family

## 2017-10-29 VITALS — BP 128/75 | HR 71 | Temp 98.0°F | Resp 20 | Wt 240.8 lb

## 2017-10-29 DIAGNOSIS — N921 Excessive and frequent menstruation with irregular cycle: Secondary | ICD-10-CM

## 2017-10-29 DIAGNOSIS — K912 Postsurgical malabsorption, not elsewhere classified: Secondary | ICD-10-CM | POA: Insufficient documentation

## 2017-10-29 DIAGNOSIS — D509 Iron deficiency anemia, unspecified: Secondary | ICD-10-CM | POA: Insufficient documentation

## 2017-10-29 DIAGNOSIS — Z86718 Personal history of other venous thrombosis and embolism: Secondary | ICD-10-CM | POA: Insufficient documentation

## 2017-10-29 DIAGNOSIS — D508 Other iron deficiency anemias: Secondary | ICD-10-CM

## 2017-10-29 DIAGNOSIS — D6852 Prothrombin gene mutation: Secondary | ICD-10-CM | POA: Insufficient documentation

## 2017-10-29 DIAGNOSIS — D500 Iron deficiency anemia secondary to blood loss (chronic): Secondary | ICD-10-CM

## 2017-10-29 LAB — CMP (CANCER CENTER ONLY)
ALT: 14 U/L (ref 0–55)
AST: 21 U/L (ref 5–34)
Albumin: 4 g/dL (ref 3.5–5.0)
Alkaline Phosphatase: 115 U/L (ref 40–150)
Anion gap: 10 (ref 3–11)
BUN: 16 mg/dL (ref 7–26)
CHLORIDE: 102 mmol/L (ref 98–109)
CO2: 26 mmol/L (ref 22–29)
Calcium: 9.8 mg/dL (ref 8.4–10.4)
Creatinine: 0.83 mg/dL (ref 0.60–1.10)
Glucose, Bld: 175 mg/dL — ABNORMAL HIGH (ref 70–140)
POTASSIUM: 4.2 mmol/L (ref 3.5–5.1)
SODIUM: 138 mmol/L (ref 136–145)
Total Bilirubin: 0.2 mg/dL (ref 0.2–1.2)
Total Protein: 7.9 g/dL (ref 6.4–8.3)

## 2017-10-29 LAB — CBC WITH DIFFERENTIAL (CANCER CENTER ONLY)
Basophils Absolute: 0.1 10*3/uL (ref 0.0–0.1)
Basophils Relative: 1 %
Eosinophils Absolute: 0.2 10*3/uL (ref 0.0–0.5)
Eosinophils Relative: 2 %
HCT: 42.4 % (ref 34.8–46.6)
HEMOGLOBIN: 13.6 g/dL (ref 11.6–15.9)
LYMPHS ABS: 2.6 10*3/uL (ref 0.9–3.3)
LYMPHS PCT: 24 %
MCH: 29.7 pg (ref 26.0–34.0)
MCHC: 32.1 g/dL (ref 32.0–36.0)
MCV: 92.6 fL (ref 81.0–101.0)
MONOS PCT: 9 %
Monocytes Absolute: 1 10*3/uL — ABNORMAL HIGH (ref 0.1–0.9)
NEUTROS PCT: 64 %
Neutro Abs: 7.2 10*3/uL — ABNORMAL HIGH (ref 1.5–6.5)
Platelet Count: 371 10*3/uL (ref 145–400)
RBC: 4.58 MIL/uL (ref 3.70–5.32)
RDW: 13.9 % (ref 11.1–15.7)
WBC: 11 10*3/uL — AB (ref 3.9–10.0)

## 2017-10-29 LAB — RETICULOCYTES
RBC.: 4.49 MIL/uL (ref 3.70–5.45)
RETIC COUNT ABSOLUTE: 76.3 10*3/uL (ref 33.7–90.7)
Retic Ct Pct: 1.7 % (ref 0.7–2.1)

## 2017-10-29 NOTE — Progress Notes (Signed)
Hematology and Oncology Follow Up Visit  [name] [id number] 06-20-1971 47 y.o. 10/29/2017   Principle Diagnosis:  Heterozygous for the [id number] prothrombin gene mutation  History of DVT on an oral contraceptive Iron deficiency anemia secondary to heavy cycle and malabsorption after gastric bypass  Current Therapy:   IV iron as indicated - last received in October 2018   Interim History:  Ms. [name] is here today for follow-up. She is feeling fatigued and having numbness and tingling in her hands and feet.  No fever, chills, chewing ice, n/v, cough, rash, dizziness, SOB, chest pain, palpitations, abdominal pain or changes in bowel or bladder habits.  No episodes of bleeding, no bruising or petechiae. She has not had a cycle since December.  Her vitamin D is low and she is following up with her PCP for management of this.  No lymphadenopathy noted on exam.   No swelling or tenderness in his extremities. No c/o pain.  She has maintained a good appetite and is staying well hydrated. Her weight is stable.   ECOG Performance Status: 1 - Symptomatic but completely ambulatory  Medications:  Allergies as of 10/29/2017      Reactions   Bactrim [sulfamethoxazole-trimethoprim] Other (See Comments)   HALLUCINATIONS FEVER CHILLS   Ibuprofen Other (See Comments)   Had gastric bypass. Told not to take NSAIDs.   Lamotrigine Hives   Sulfa Antibiotics Other (See Comments)   HALLUCINATIONS FEVER CHILLS      Medication List        Accurate as of 10/29/17  2:14 PM. Always use your most recent med list.          Acetaminophen 167 MG/5ML Liqd Take by mouth every 8 (eight) hours as needed (pain).   CALCIUM 600 + D PO Take 1 tablet by mouth 2 (two) times daily.   DULoxetine 60 MG capsule Commonly known as:  CYMBALTA Take 120 mg by mouth every morning.   LYRICA 25 MG capsule Generic drug:  pregabalin Take 25 mg by mouth 3 (three) times daily. Take 3 times daily   Melatonin 1  MG Subl Place 3 mg under the tongue at bedtime.   multivitamin tablet Take 1 tablet by mouth 2 (two) times daily.   Vitamin D (Ergocalciferol) 50000 units Caps capsule Commonly known as:  DRISDOL Take 1 capsule (50,000 Units total) by mouth every 7 (seven) days.       Allergies:  Allergies  Allergen Reactions  . Bactrim [Sulfamethoxazole-Trimethoprim] Other (See Comments)    HALLUCINATIONS FEVER CHILLS  . Ibuprofen Other (See Comments)    Had gastric bypass. Told not to take NSAIDs.  . Lamotrigine Hives  . Sulfa Antibiotics Other (See Comments)    HALLUCINATIONS FEVER CHILLS      Past Medical History, Surgical history, Social history, and Family History were reviewed and updated.  Review of Systems: All other 10 point review of systems is negative.   Physical Exam:  vitals were not taken for this visit.   Wt Readings from Last 3 Encounters:  09/19/17 237 lb (107.5 kg)  07/22/17 238 lb (108 kg)  07/15/17 238 lb 1.6 oz (108 kg)    Ocular: Sclerae unicteric, pupils equal, round and reactive to light Ear-nose-throat: Oropharynx clear, dentition fair Lymphatic: No cervical, supraclavicular or axillary adenopathy Lungs no rales or rhonchi, good excursion bilaterally Heart regular rate and rhythm, no murmur appreciated Abd soft, nontender, positive bowel sounds, no liver or spleen tip palpated on exam, no fluid wave  MSK  no focal spinal tenderness, no joint edema Neuro: non-focal, well-oriented, appropriate affect Breasts: Deferred   Lab Results  Component Value Date   WBC 11.0 (H) 10/29/2017   HGB 13.3 09/19/2017   HCT 42.4 10/29/2017   MCV 92.6 10/29/2017   PLT 371 10/29/2017   Lab Results  Component Value Date   FERRITIN 122 09/19/2017   IRON 83 09/19/2017   TIBC 336 09/19/2017   UIBC 253 09/19/2017   IRONPCTSAT 25 09/19/2017   Lab Results  Component Value Date   RBC 4.58 10/29/2017   No results found for: Nils Pyle  [facility] Lab Results  Component Value Date   IGGSERUM 868 08/10/2014   IGA 164 08/02/2014   IGMSERUM 49 (L) 08/02/2014   Lab Results  Component Value Date   TOTALPROTELP 7.1 08/02/2014   ALBUMINELP 61.8 08/02/2014   A1GS 4.0 08/02/2014   A2GS 10.2 08/02/2014   BETS 5.8 08/02/2014   BETA2SER 5.1 08/02/2014   GAMS 13.1 08/02/2014   MSPIKE NOT DET 08/02/2014   SPEI SEE NOTE 08/02/2014     Chemistry      Component Value Date/Time   NA 138 07/15/2017 0844   NA 137 05/14/2017 1054   K 3.9 07/15/2017 0844   K 3.6 05/14/2017 1054   CL 103 07/15/2017 0844   CO2 28 07/15/2017 0844   CO2 26 05/14/2017 1054   BUN 10 07/15/2017 0844   BUN 10.7 05/14/2017 1054   CREATININE 0.76 07/15/2017 0844   CREATININE 0.7 05/14/2017 1054   GLU 91 11/27/2016      Component Value Date/Time   CALCIUM 9.0 07/15/2017 0844   CALCIUM 9.2 05/14/2017 1054   ALKPHOS 85 05/14/2017 1054   AST 16 05/14/2017 1054   ALT 12 05/14/2017 1054   BILITOT 0.40 05/14/2017 1054      Impression and Plan: Ms. [name] is a very pleasant 47 yo caucasian female with iron deficiency secondary to heavy cycles and malabsorption after gastric bypass. Her cycle has stopped and she has not had one since December.  She is symptomatic with fatigue and numbness and tingling in her hands and feet.  We will see what her iron studies show and bring her back in for infusion if needed.  We will go ahead and plan to see her back in another 4 months for follow-up.  She will contact our office with any questions or concerns. We can certainly see her sooner if need be.   Laverna Peace, NP 3/20/20192:14 PM

## 2017-10-30 LAB — IRON AND TIBC
IRON: 84 ug/dL (ref 41–142)
Saturation Ratios: 23 % (ref 21–57)
TIBC: 371 ug/dL (ref 236–444)
UIBC: 287 ug/dL

## 2017-10-30 LAB — FERRITIN: Ferritin: 55 ng/mL (ref 9–269)

## 2017-11-03 ENCOUNTER — Encounter: Payer: Self-pay | Admitting: Family

## 2017-11-10 ENCOUNTER — Other Ambulatory Visit: Payer: Self-pay | Admitting: Obstetrics and Gynecology

## 2017-11-10 DIAGNOSIS — Z1231 Encounter for screening mammogram for malignant neoplasm of breast: Secondary | ICD-10-CM

## 2017-11-17 ENCOUNTER — Ambulatory Visit: Payer: BLUE CROSS/BLUE SHIELD | Admitting: Family Medicine

## 2017-11-17 ENCOUNTER — Encounter: Payer: Self-pay | Admitting: Family Medicine

## 2017-11-17 VITALS — BP 120/80 | HR 80 | Temp 98.0°F | Ht 68.0 in | Wt 245.8 lb

## 2017-11-17 DIAGNOSIS — S65519A Laceration of blood vessel of unspecified finger, initial encounter: Secondary | ICD-10-CM | POA: Diagnosis not present

## 2017-11-17 DIAGNOSIS — E559 Vitamin D deficiency, unspecified: Secondary | ICD-10-CM | POA: Diagnosis not present

## 2017-11-17 NOTE — Progress Notes (Signed)
HPI:  Using dictation device. Unfortunately this device frequently misinterprets words/phrases.  [name] is a very pleasant 47 year old here for an acute visit for a laceration on the left thumb.  She cut this with a bread knife 2 days ago.  Reports she cleaned the area well with soap and water.  She has continue to wash it and when she does the blood clot comes off and it bleeds again.  She has not had any swelling, pus, fevers  or signs of infection. She also has vitamin D deficiency.  Reports she checked this elsewhere.  Needs vitamin D to take.  Was not taking any vitamin D when she checked this at an outside facility.  She does not remember the level.  ROS: See pertinent positives and negatives per HPI.  Past Medical History:  Diagnosis Date  . Anxiety and depression   . Arthritis   . Complication of anesthesia    has been hard to wake up post-op  . Dental crowns present   . Depression   . Diabetes ([place])    resolved with gastric bypass  . DVT of lower extremity (deep venous thrombosis) ([place]) 2012   LLE  . Dysmenorrhea    hx endometriosis and fibroid  . Family history of breast cancer   . Headache(784.0)    tension or sinus  . History of MRSA infection prior to 2012   lasted 5-6 yr.  . Hx of nipple discharge    Bilateral.  Long standing.  Negative work up.   . Jaw snapping    states jaw pops  . Left foot drop 2016   --[facility]  . Neuromuscular disorder ([place])    neuropathy in hands,feet,face  . Neuropathy 2015   hands, feet and face--[facility]  . Prothrombin gene mutation ([place]) 2018   single gene mutation  . Right knee pain    hx meniscal injury, chondromalacia, OA  . Thyroid nodule     Past Surgical History:  Procedure Laterality Date  . CHOLECYSTECTOMY  1990's  . DILITATION & CURRETTAGE/HYSTROSCOPY WITH NOVASURE ABLATION N/A 05/30/2014   Procedure: DILATATION & CURETTAGE/HYSTEROSCOPY WITH attempted [place] and with IUD removal ;   Surgeon: Jamey Reas de Berton Lan, MD;  Location: [facility];  Service: Gynecology;  Laterality: N/A;  . GASTRIC ROUX-EN-Y N/A 11/02/2013   Procedure: LAPAROSCOPIC ROUX-EN-Y GASTRIC BYPASS WITH UPPER ENDOSCOPY;  Surgeon: Gayland Curry, MD;  Location: [facility];  Service: General;  Laterality: N/A;  . HAND SURGERY Left    X 3 - spider bite and MRSA infection  . HYSTEROSCOPY WITH RESECTOSCOPE N/A 05/30/2014   Procedure: HYSTEROSCOPY WITH RESECTOSCOPE;  Surgeon: Jamey Reas de Berton Lan, MD;  Location: [facility];  Service: Gynecology;  Laterality: N/A;  . KNEE ARTHROSCOPY Right 12/29/2012   Procedure: RIGHT KNEE ARTHROSCOPY  PARTIAL MEDIAL AND LATERAL MENISECTOMY WITH CHONDROPLASTY;  Surgeon: Hessie Dibble, MD;  Location: [facility];  Service: Orthopedics;  Laterality: Right;  . PELVIC LAPAROSCOPY  1990's   d/t endometriosis  . TONSILLECTOMY  as child  . TOTAL KNEE ARTHROPLASTY Left 07/22/2017  . TOTAL KNEE ARTHROPLASTY Left 07/22/2017   Procedure: TOTAL KNEE ARTHROPLASTY;  Surgeon: Melrose Nakayama, MD;  Location: [facility];  Service: Orthopedics;  Laterality: Left;  . WRIST SURGERY Right 2005    Family History  Problem Relation Age of Onset  . Diabetes Mother        Living  . Kidney disease Mother   .  Hypertension Mother   . Breast cancer Mother 61       dx'd with breast ca x2; 2nd around 68  . Diabetes Maternal Grandmother 1       dec  . Breast cancer Maternal Grandmother        dx in her 48s  . Arthritis Father   . Healthy Sister   . Healthy Sister   . Diabetes Maternal Grandfather   . Stroke Maternal Grandfather   . Healthy Daughter   . Breast cancer Other        2 great aunts  . Thyroid disease Neg Hx     SOCIAL HX: See HPI   Current Outpatient Medications:  .  Acetaminophen 167 MG/5ML LIQD, Take by mouth every 8 (eight) hours as needed (pain)., Disp: , Rfl:  .  Calcium Carb-Cholecalciferol (CALCIUM 600 + D PO), Take 1 tablet by mouth 2  (two) times daily., Disp: , Rfl:  .  DULoxetine (CYMBALTA) 60 MG capsule, Take 120 mg by mouth every morning. , Disp: , Rfl:  .  LYRICA 25 MG capsule, Take 25 mg by mouth 3 (three) times daily. Take 3 times daily, Disp: , Rfl:  .  Melatonin 1 MG SUBL, Place 3 mg under the tongue at bedtime., Disp: 1 tablet, Rfl: 2 .  Multiple Vitamin (MULTIVITAMIN) tablet, Take 1 tablet by mouth 2 (two) times daily. , Disp: , Rfl:  .  Vitamin D, Ergocalciferol, (DRISDOL) 50000 units CAPS capsule, Take 1 capsule (50,000 Units total) by mouth every 7 (seven) days., Disp: 12 capsule, Rfl: 0  EXAM:  Vitals:   11/17/17 1106  BP: 120/80  Pulse: 80  Temp: 98 F (36.7 C)    Body mass index is 37.37 kg/m.  GENERAL: vitals reviewed and listed above, alert, oriented, appears well hydrated and in no acute distress  HEENT: atraumatic, conjunttiva clear, no obvious abnormalities on inspection of external nose and ears  Skin: Small approximately 1 cm in length laceration on the left thumb, superficial, this is mostly healed, very small area of the laceration is open and there is some dried blood here.  No signs of infection.  Cap refill normal.   PSYCH: pleasant and cooperative, no obvious depression or anxiety  ASSESSMENT AND PLAN:  Discussed the following assessment and plan:  Laceration of blood vessel of finger, initial encounter  Vitamin D deficiency  -Wound appears clean and dry, she likely did lacerate a small capillary and this rebleeds when she washes off the clot -a tiny area of the lack is open, we applied a Steri-Strip here after cleaning the area with saline -Discussed signs and symptoms of infection and advised her to seek care immediately if this should become infected, no signs of infection today, wound care recs provided Recommended per consumer [employer] the-[employer] natur[employer] vitamin D3 drops, 1000 international units daily  -Patient advised to return or notify a doctor immediately if symptoms  worsen or persist or new concerns arise.  There are no Patient Instructions on file for this visit.  Lucretia Kern, DO

## 2017-11-20 DIAGNOSIS — R682 Dry mouth, unspecified: Secondary | ICD-10-CM | POA: Diagnosis not present

## 2017-11-27 ENCOUNTER — Encounter: Payer: Self-pay | Admitting: Family Medicine

## 2017-11-27 DIAGNOSIS — H04123 Dry eye syndrome of bilateral lacrimal glands: Secondary | ICD-10-CM | POA: Diagnosis not present

## 2017-11-27 LAB — HM DIABETES EYE EXAM

## 2017-11-28 ENCOUNTER — Ambulatory Visit: Payer: BLUE CROSS/BLUE SHIELD

## 2017-12-17 ENCOUNTER — Ambulatory Visit: Payer: BLUE CROSS/BLUE SHIELD

## 2017-12-18 ENCOUNTER — Encounter: Payer: Self-pay | Admitting: Family Medicine

## 2017-12-18 ENCOUNTER — Ambulatory Visit (INDEPENDENT_AMBULATORY_CARE_PROVIDER_SITE_OTHER)
Admission: RE | Admit: 2017-12-18 | Discharge: 2017-12-18 | Disposition: A | Discharge status: Home / Self Care | Payer: BLUE CROSS/BLUE SHIELD | Source: Ambulatory Visit | Attending: Family Medicine | Admitting: Family Medicine

## 2017-12-18 ENCOUNTER — Ambulatory Visit: Payer: BLUE CROSS/BLUE SHIELD | Admitting: Family Medicine

## 2017-12-18 VITALS — BP 122/80 | HR 85 | Temp 98.3°F | Ht 68.0 in | Wt 244.3 lb

## 2017-12-18 DIAGNOSIS — J988 Other specified respiratory disorders: Secondary | ICD-10-CM

## 2017-12-18 DIAGNOSIS — R042 Hemoptysis: Secondary | ICD-10-CM | POA: Diagnosis not present

## 2017-12-18 DIAGNOSIS — R05 Cough: Secondary | ICD-10-CM | POA: Diagnosis not present

## 2017-12-18 LAB — POC INFLUENZA A&B (BINAX/QUICKVUE)
INFLUENZA B, POC: NEGATIVE
Influenza A, POC: NEGATIVE

## 2017-12-18 MED ORDER — BENZONATATE 100 MG PO CAPS: 100.0000 mg | ORAL_CAPSULE | Freq: Three times a day (TID) | ORAL | 0 refills | Status: DC | PRN

## 2017-12-18 NOTE — Progress Notes (Signed)
HPI:  Using dictation device. Unfortunately this device frequently misinterprets words/phrases.   Acute visit for respiratory illness: -started:2 days ago -symptoms:nasal congestion, sore throat, HA, cough, body aches, fever to 102 initially, lower yesterday, bloody sputum a few times -deniesNVD, tooth pain -has tried: OTC options, tylenol -sick contacts/travel/risks: no reported flu, strep or tick exposure - but daughter with the flu a few weeks ago, then another family member with similar resp illness with fevers this past week and then treated with zpack  ROS: See pertinent positives and negatives per HPI.  Past Medical History:  Diagnosis Date  . Anxiety and depression   . Arthritis   . Complication of anesthesia    has been hard to wake up post-op  . Dental crowns present   . Depression   . Diabetes ([place])    resolved with gastric bypass  . DVT of lower extremity (deep venous thrombosis) ([place]) 2012   LLE  . Dysmenorrhea    hx endometriosis and fibroid  . Family history of breast cancer   . Headache(784.0)    tension or sinus  . History of MRSA infection prior to 2012   lasted 5-6 yr.  . Hx of nipple discharge    Bilateral.  Long standing.  Negative work up.   . Jaw snapping    states jaw pops  . Left foot drop 2016   --[place] [facility]  . Neuromuscular disorder ([place])    neuropathy in hands,feet,face  . Neuropathy 2015   hands, feet and face--[place] [facility]  . Prothrombin gene mutation ([place]) 2018   single gene mutation  . Right knee pain    hx meniscal injury, chondromalacia, OA  . Thyroid nodule     Past Surgical History:  Procedure Laterality Date  . CHOLECYSTECTOMY  1990's  . DILITATION & CURRETTAGE/HYSTROSCOPY WITH NOVASURE ABLATION N/A 05/30/2014   Procedure: DILATATION & CURETTAGE/HYSTEROSCOPY WITH attempted [place] and with IUD removal ;  Surgeon: Jamey Reas de Berton Lan, MD;  Location: [facility];  Service: Gynecology;   Laterality: N/A;  . GASTRIC ROUX-EN-Y N/A 11/02/2013   Procedure: LAPAROSCOPIC ROUX-EN-Y GASTRIC BYPASS WITH UPPER ENDOSCOPY;  Surgeon: Gayland Curry, MD;  Location: [facility];  Service: General;  Laterality: N/A;  . HAND SURGERY Left    X 3 - spider bite and MRSA infection  . HYSTEROSCOPY WITH RESECTOSCOPE N/A 05/30/2014   Procedure: HYSTEROSCOPY WITH RESECTOSCOPE;  Surgeon: Jamey Reas de Berton Lan, MD;  Location: [facility];  Service: Gynecology;  Laterality: N/A;  . KNEE ARTHROSCOPY Right 12/29/2012   Procedure: RIGHT KNEE ARTHROSCOPY  PARTIAL MEDIAL AND LATERAL MENISECTOMY WITH CHONDROPLASTY;  Surgeon: Hessie Dibble, MD;  Location: [facility];  Service: Orthopedics;  Laterality: Right;  . PELVIC LAPAROSCOPY  1990's   d/t endometriosis  . TONSILLECTOMY  as child  . TOTAL KNEE ARTHROPLASTY Left 07/22/2017  . TOTAL KNEE ARTHROPLASTY Left 07/22/2017   Procedure: TOTAL KNEE ARTHROPLASTY;  Surgeon: Melrose Nakayama, MD;  Location: [facility];  Service: Orthopedics;  Laterality: Left;  . WRIST SURGERY Right 2005    Family History  Problem Relation Age of Onset  . Diabetes Mother        Living  . Kidney disease Mother   . Hypertension Mother   . Breast cancer Mother 22       dx'd with breast ca x2; 2nd around 80  . Diabetes Maternal Grandmother 1       dec  . Breast  cancer Maternal Grandmother        dx in her 68s  . Arthritis Father   . Healthy Sister   . Healthy Sister   . Diabetes Maternal Grandfather   . Stroke Maternal Grandfather   . Healthy Daughter   . Breast cancer Other        2 great aunts  . Thyroid disease Neg Hx     Social History   Socioeconomic History  . Marital status: Married    Spouse name: Not on file  . Number of children: Not on file  . Years of education: Not on file  . Highest education level: Not on file  Occupational History  . Not on file  Social Needs  . Financial resource strain: Not on file  . Food insecurity:    Worry:  Not on file    Inability: Not on file  . Transportation needs:    Medical: Not on file    Non-medical: Not on file  Tobacco Use  . Smoking status: Never Smoker  . Smokeless tobacco: Never Used  Substance and Sexual Activity  . Alcohol use: Yes    Alcohol/week: 1.2 - 2.4 oz    Types: 2 - 4 Glasses of wine per week    Comment: 2-4 glasses of wine a few times per month  . Drug use: No  . Sexual activity: Yes    Partners: Female    Comment: female partner  Lifestyle  . Physical activity:    Days per week: Not on file    Minutes per session: Not on file  . Stress: Not on file  Relationships  . Social connections:    Talks on phone: Not on file    Gets together: Not on file    Attends religious service: Not on file    Active member of club or organization: Not on file    Attends meetings of clubs or organizations: Not on file    Relationship status: Not on file  Other Topics Concern  . Not on file  Social History Narrative   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, [employer]ell[employer] level of education:  Masters      Home Situation: lives with daughter      Spiritual Beliefs: none      Lifestyle: no regular exercising; diet is so so      Caffeine use: daily              Current Outpatient Medications:  .  Acetaminophen 167 MG/5ML LIQD, Take by mouth every 8 (eight) hours as needed (pain)., Disp: , Rfl:  .  Calcium Carb-Cholecalciferol (CALCIUM 600 + D PO), Take 1 tablet by mouth 2 (two) times daily., Disp: , Rfl:  .  DULoxetine (CYMBALTA) 60 MG capsule, Take 120 mg by mouth every morning. , Disp: , Rfl:  .  LYRICA 25 MG capsule, Take 25 mg by mouth 3 (three) times daily. Take 3 times daily, Disp: , Rfl:  .  Melatonin 1 MG SUBL, Place 3 mg under the tongue at bedtime., Disp: 1 tablet, Rfl: 2 .  Multiple Vitamin (MULTIVITAMIN) tablet, Take 1 tablet by mouth 2 (two) times daily. , Disp: , Rfl:  .  Vitamin D, Ergocalciferol, (DRISDOL) 50000 units CAPS capsule, Take 1 capsule  (50,000 Units total) by mouth every 7 (seven) days., Disp: 12 capsule, Rfl: 0  EXAM:  Vitals:   12/18/17 1132  BP: 122/80  Pulse: 85  Temp: 98.3 F (  36.8 C)  SpO2: 98%    Body mass index is 37.15 kg/m.  GENERAL: vitals reviewed and listed above, alert, oriented, appears well hydrated and in no acute distress  HEENT: atraumatic, conjunttiva clear, no obvious abnormalities on inspection of external nose and ears, normal appearance of ear canals and TMs, clear nasal congestion, dry nasal mucosa, mild post oropharyngeal erythema with PND, no tonsillar edema or exudate, no sinus TTP  NECK: no obvious masses on inspection  LUNGS: clear to auscultation bilaterally, no wheezes, rales or rhonchi, good air movement  CV: HRRR, no peripheral edema  MS: moves all extremities without noticeable abnormality  PSYCH: pleasant and cooperative, no obvious depression or anxiety  ASSESSMENT AND PLAN:  Discussed the following assessment and plan:  Respiratory infection - Plan: DG Chest 2 View  Hemoptysis - Plan: DG Chest 2 View  -discussed potential etiologies with VURI being most likely, and advised supportive care and monitoring, CXR and flu test (though late in the season) and prompt re-evaluation if worsening or not improving. We discussed treatment side effects, likely course, antibiotic misuse, transmission, and signs of developing a serious illness. -tessalon for cough -of course, we advised to return or notify a doctor immediately if symptoms worsen or persist or new concerns arise.    Patient Instructions  BEFORE YOU LEAVE: -xray sheet -flu test  Go to get the chest xray now.  Tessalon sent to the pharmacy to use as needed for cough.  I hope you are feeling better soon! Seek care promptly if your symptoms worsen, new concerns arise or you are not improving with treatment.    INSTRUCTIONS FOR UPPER RESPIRATORY INFECTION:  -plenty of rest and fluids  -nasal saline wash  2-3 times daily (use prepackaged nasal saline or bottled/distilled water if making your own)   -can use AFRIN nasal spray for drainage and nasal congestion - but do NOT use longer then 3-4 days  -can use tylenol (in no history of liver disease) or ibuprofen (if no history of kidney disease, bowel bleeding or significant heart disease) as directed for aches and sorethroat  -in the winter time, using a humidifier at night is helpful (please follow cleaning instructions)  -if you are taking a cough medication - use only as directed, may also try a teaspoon of honey to coat the throat and throat lozenges. If given a cough medication with codeine or hydrocodone or other narcotic please be advised that this contains a strong and  potentially addicting medication. Please follow instructions carefully, take as little as possible and only use AS NEEDED for severe cough. Discuss potential side effects with your pharmacy. Please do not drive or operate machinery while taking these types of medications. Please do not take other sedating medications, drugs or alcohol while taking this medication without discussing with your doctor.  -for sore throat, salt water gargles can help  -follow up if you have fevers, facial pain, tooth pain, difficulty breathing or are worsening or symptoms persist longer then expected  Upper Respiratory Infection, Adult An upper respiratory infection (URI) is also known as the common cold. It is often caused by a type of germ (virus). Colds are easily spread (contagious). You can pass it to others by kissing, coughing, sneezing, or drinking out of the same glass. Usually, you get better in 1 to 3  weeks.  However, the cough can last for even longer. HOME CARE   Only take medicine as told by your doctor. Follow instructions provided above.  Drink enough water and fluids to keep your pee (urine) clear or pale yellow.  Get plenty of rest.  Return to work when your temperature is <  100 for 24 hours or as told by your doctor. You may use a face mask and wash your hands to stop your cold from spreading. GET HELP RIGHT AWAY IF:   After the first few days, you feel you are getting worse.  You have questions about your medicine.  You have chills, shortness of breath, or red spit (mucus).  You have pain in the face for more then 1-2 days, especially when you bend forward.  You have a fever, puffy (swollen) neck, pain when you swallow, or white spots in the back of your throat.  You have a bad headache, ear pain, sinus pain, or chest pain.  You have a high-pitched whistling sound when you breathe in and out (wheezing).  You cough up blood.  You have sore muscles or a stiff neck. MAKE SURE YOU:   Understand these instructions.  Will watch your condition.  Will get help right away if you are not doing well or get worse. Document Released: 01/15/2008 Document Revised: 10/21/2011 Document Reviewed: 11/03/2013 [facility] Patient Information 2015 [place], [place]. This information is not intended to replace advice given to you by your health care provider. Make sure you discuss any questions you have with your health care provider.     Lucretia Kern, DO

## 2017-12-18 NOTE — Patient Instructions (Signed)
BEFORE YOU LEAVE: -xray sheet -flu test  Go to get the chest xray now.  Tessalon sent to the pharmacy to use as needed for cough.  I hope you are feeling better soon! Seek care promptly if your symptoms worsen, new concerns arise or you are not improving with treatment.    INSTRUCTIONS FOR UPPER RESPIRATORY INFECTION:  -plenty of rest and fluids  -nasal saline wash 2-3 times daily (use prepackaged nasal saline or bottled/distilled water if making your own)   -can use AFRIN nasal spray for drainage and nasal congestion - but do NOT use longer then 3-4 days  -can use tylenol (in no history of liver disease) or ibuprofen (if no history of kidney disease, bowel bleeding or significant heart disease) as directed for aches and sorethroat  -in the winter time, using a humidifier at night is helpful (please follow cleaning instructions)  -if you are taking a cough medication - use only as directed, may also try a teaspoon of honey to coat the throat and throat lozenges. If given a cough medication with codeine or hydrocodone or other narcotic please be advised that this contains a strong and  potentially addicting medication. Please follow instructions carefully, take as little as possible and only use AS NEEDED for severe cough. Discuss potential side effects with your pharmacy. Please do not drive or operate machinery while taking these types of medications. Please do not take other sedating medications, drugs or alcohol while taking this medication without discussing with your doctor.  -for sore throat, salt water gargles can help  -follow up if you have fevers, facial pain, tooth pain, difficulty breathing or are worsening or symptoms persist longer then expected  Upper Respiratory Infection, Adult An upper respiratory infection (URI) is also known as the common cold. It is often caused by a type of germ (virus). Colds are easily spread (contagious). You can pass it to others by kissing,  coughing, sneezing, or drinking out of the same glass. Usually, you get better in 1 to 3  weeks.  However, the cough can last for even longer. HOME CARE   Only take medicine as told by your doctor. Follow instructions provided above.  Drink enough water and fluids to keep your pee (urine) clear or pale yellow.  Get plenty of rest.  Return to work when your temperature is < 100 for 24 hours or as told by your doctor. You may use a face mask and wash your hands to stop your cold from spreading. GET HELP RIGHT AWAY IF:   After the first few days, you feel you are getting worse.  You have questions about your medicine.  You have chills, shortness of breath, or red spit (mucus).  You have pain in the face for more then 1-2 days, especially when you bend forward.  You have a fever, puffy (swollen) neck, pain when you swallow, or white spots in the back of your throat.  You have a bad headache, ear pain, sinus pain, or chest pain.  You have a high-pitched whistling sound when you breathe in and out (wheezing).  You cough up blood.  You have sore muscles or a stiff neck. MAKE SURE YOU:   Understand these instructions.  Will watch your condition.  Will get help right away if you are not doing well or get worse. Document Released: 01/15/2008 Document Revised: 10/21/2011 Document Reviewed: 11/03/2013 [facility] Patient Information 2015 [place], [place]. This information is not intended to replace advice given to you by  your health care provider. Make sure you discuss any questions you have with your health care provider.

## 2017-12-18 NOTE — Addendum Note (Signed)
Addended by: Agnes Lawrence on: 12/18/2017 12:03 PM   Modules accepted: Orders

## 2017-12-22 ENCOUNTER — Ambulatory Visit: Payer: BLUE CROSS/BLUE SHIELD | Admitting: Family Medicine

## 2017-12-22 ENCOUNTER — Encounter: Payer: Self-pay | Admitting: Family Medicine

## 2017-12-22 VITALS — BP 118/70 | HR 90 | Temp 99.0°F | Ht 68.0 in | Wt 246.3 lb

## 2017-12-22 DIAGNOSIS — J989 Respiratory disorder, unspecified: Secondary | ICD-10-CM | POA: Diagnosis not present

## 2017-12-22 LAB — POCT RAPID STREP A (OFFICE): RAPID STREP A SCREEN: NEGATIVE

## 2017-12-22 NOTE — Patient Instructions (Signed)
BEFORE YOU LEAVE: -rapid strep -follow up 1 week  Afrin 2 sprays each nostril daily for 4 days - then stop. Do not use longer then 4 days.  Follow up in 1 week to recheck, sooner if worsening, new concerns or not starting to improve over the next few days.

## 2017-12-22 NOTE — Progress Notes (Signed)
HPI:  Using dictation device. Unfortunately this device frequently misinterprets words/phrases.  Acute visit for ear pain: -URI for 5-6 days -fevers trended down and no temps above 99s the last few days -has developed some ear discomfort -has continued nasal congestion, pnd, sore throat and cough -no fevers, sob, vomiting or diarrhea -is using saline and tylenol -no using afrin, no hx glaucoma  ROS: See pertinent positives and negatives per HPI.  Past Medical History:  Diagnosis Date  . Anxiety and depression   . Arthritis   . Complication of anesthesia    has been hard to wake up post-op  . Dental crowns present   . Depression   . Diabetes ([place])    resolved with gastric bypass  . DVT of lower extremity (deep venous thrombosis) ([place]) 2012   LLE  . Dysmenorrhea    hx endometriosis and fibroid  . Family history of breast cancer   . Headache(784.0)    tension or sinus  . History of MRSA infection prior to 2012   lasted 5-6 yr.  . Hx of nipple discharge    Bilateral.  Long standing.  Negative work up.   . Jaw snapping    states jaw pops  . Left foot drop 2016   --[place] [facility]  . Neuromuscular disorder ([place])    neuropathy in hands,feet,face  . Neuropathy 2015   hands, feet and face--[place] [facility]  . Prothrombin gene mutation ([place]) 2018   single gene mutation  . Right knee pain    hx meniscal injury, chondromalacia, OA  . Thyroid nodule     Past Surgical History:  Procedure Laterality Date  . CHOLECYSTECTOMY  1990's  . DILITATION & CURRETTAGE/HYSTROSCOPY WITH NOVASURE ABLATION N/A 05/30/2014   Procedure: DILATATION & CURETTAGE/HYSTEROSCOPY WITH attempted [place] and with IUD removal ;  Surgeon: Jamey Reas de Berton Lan, MD;  Location: [facility];  Service: Gynecology;  Laterality: N/A;  . GASTRIC ROUX-EN-Y N/A 11/02/2013   Procedure: LAPAROSCOPIC ROUX-EN-Y GASTRIC BYPASS WITH UPPER ENDOSCOPY;  Surgeon: Gayland Curry, MD;  [facility];  Service: General;  Laterality: N/A;  . HAND SURGERY Left    X 3 - spider bite and MRSA infection  . HYSTEROSCOPY WITH RESECTOSCOPE N/A 05/30/2014   Procedure: HYSTEROSCOPY WITH RESECTOSCOPE;  Surgeon: Jamey Reas de Berton Lan, MD;  Location: [facility];  Service: Gynecology;  Laterality: N/A;  . KNEE ARTHROSCOPY Right 12/29/2012   Procedure: RIGHT KNEE ARTHROSCOPY  PARTIAL MEDIAL AND LATERAL MENISECTOMY WITH CHONDROPLASTY;  Surgeon: Hessie Dibble, MD;  Location: [facility];  Service: Orthopedics;  Laterality: Right;  . PELVIC LAPAROSCOPY  1990's   d/t endometriosis  . TONSILLECTOMY  as child  . TOTAL KNEE ARTHROPLASTY Left 07/22/2017  . TOTAL KNEE ARTHROPLASTY Left 07/22/2017   Procedure: TOTAL KNEE ARTHROPLASTY;  Surgeon: Melrose Nakayama, MD;  Location: [facility];  Service: Orthopedics;  Laterality: Left;  . WRIST SURGERY Right 2005    Family History  Problem Relation Age of Onset  . Diabetes Mother        Living  . Kidney disease Mother   . Hypertension Mother   . Breast cancer Mother 6       dx'd with breast ca x2; 2nd around 54  . Diabetes Maternal Grandmother 43       dec  . Breast cancer Maternal Grandmother        dx in her 74s  . Arthritis Father   . Healthy  Sister   . Healthy Sister   . Diabetes Maternal Grandfather   . Stroke Maternal Grandfather   . Healthy Daughter   . Breast cancer Other        2 great aunts  . Thyroid disease Neg Hx     SOCIAL HX: see hpi   Current Outpatient Medications:  .  Acetaminophen 167 MG/5ML LIQD, Take by mouth every 8 (eight) hours as needed (pain)., Disp: , Rfl:  .  benzonatate (TESSALON PERLES) 100 MG capsule, Take 1 capsule (100 mg total) by mouth 3 (three) times daily as needed., Disp: 20 capsule, Rfl: 0 .  Calcium Carb-Cholecalciferol (CALCIUM 600 + D PO), Take 1 tablet by mouth 2 (two) times daily., Disp: , Rfl:  .  DULoxetine (CYMBALTA) 60 MG capsule, Take 120 mg by mouth every morning. , Disp: ,  Rfl:  .  LYRICA 25 MG capsule, Take 25 mg by mouth 3 (three) times daily. Take 3 times daily, Disp: , Rfl:  .  Melatonin 1 MG SUBL, Place 3 mg under the tongue at bedtime., Disp: 1 tablet, Rfl: 2 .  Multiple Vitamin (MULTIVITAMIN) tablet, Take 1 tablet by mouth 2 (two) times daily. , Disp: , Rfl:  .  Vitamin D, Ergocalciferol, (DRISDOL) 50000 units CAPS capsule, Take 1 capsule (50,000 Units total) by mouth every 7 (seven) days., Disp: 12 capsule, Rfl: 0  EXAM:  Vitals:   12/22/17 0906  BP: 118/70  Pulse: 90  Temp: 99 F (37.2 C)  SpO2: 98%    Body mass index is 37.45 kg/m.  GENERAL: vitals reviewed and listed above, alert, oriented, appears well hydrated and in no acute distress  HEENT: atraumatic, conjunttiva clear, no obvious abnormalities on inspection of external nose and ears, normal appearance of ear canals and TMs except for clear effusions, yellow nasal congestion, mild post oropharyngeal erythema with PND, no tonsillar edema or exudate, no sinus TTP  NECK: no obvious masses on inspection, tender small ant cervical chain nodes.   LUNGS: clear to auscultation bilaterally, no wheezes, rales or rhonchi, good air movement  CV: HRRR, no peripheral edema  MS: moves all extremities without noticeable abnormality  PSYCH: pleasant and cooperative, no obvious depression or anxiety  ASSESSMENT AND PLAN:  Discussed the following assessment and plan:  Respiratory illness - Plan: POC Rapid Strep A  --we discussed possible serious and likely etiologies, workup and treatment, treatment risks and return precautions - suspect ETD 2ndary to viral upper resp illness given fevers down and discussed symptomatic care, return precuations -follow up advised 1 week, sooner as needed -of course, we advised [place]  to return or notify a doctor immediately if symptoms worsen or persist or new concerns arise.   Patient Instructions  BEFORE YOU LEAVE: -rapid strep -follow up 1 week  Afrin  2 sprays each nostril daily for 4 days - then stop. Do not use longer then 4 days.  Follow up in 1 week to recheck, sooner if worsening, new concerns or not starting to improve over the next few days.    Lucretia Kern, DO

## 2017-12-30 ENCOUNTER — Ambulatory Visit: Payer: BLUE CROSS/BLUE SHIELD | Admitting: Family Medicine

## 2017-12-30 ENCOUNTER — Encounter: Payer: Self-pay | Admitting: Family Medicine

## 2017-12-30 VITALS — BP 110/70 | HR 86 | Temp 97.9°F | Ht 68.0 in | Wt 247.5 lb

## 2017-12-30 DIAGNOSIS — K121 Other forms of stomatitis: Secondary | ICD-10-CM | POA: Diagnosis not present

## 2017-12-30 DIAGNOSIS — K148 Other diseases of tongue: Secondary | ICD-10-CM

## 2017-12-30 DIAGNOSIS — R5382 Chronic fatigue, unspecified: Secondary | ICD-10-CM | POA: Diagnosis not present

## 2017-12-30 NOTE — Progress Notes (Signed)
HPI:  Using dictation device. Unfortunately this device frequently misinterprets words/phrases.  Follow up respiratory illness, ear pain: -started 12 days ago -initially with fever, and UR symptoms, some ear pain last week -flu and strep testing negative, presumed viral but she was concerned at follow up still sick at 5 days in so advise follow up in 1 week to ensure improving as expected -today reports: doing much better, some soreness mouth and tongue, but o/w much better, cough much better -denies:fevers, malaise, sob  Sees a number of specialists (neurology, psychiatry, cardiology, rheumatology, GI)at [facility] for chronic fatigue, chronic widespread pain and mononeuropathy. Reports has had numerous workups and nothing revealed. Mother has fibromyagia. She is tired of ongoing evaluations without a diagnoses and without feeling much better and wonders about other options.  ROS: See pertinent positives and negatives per HPI.  Past Medical History:  Diagnosis Date  . Anxiety and depression   . Arthritis   . Complication of anesthesia    has been hard to wake up post-op  . Dental crowns present   . Depression   . Diabetes ([place])    resolved with gastric bypass  . DVT of lower extremity (deep venous thrombosis) ([place]) 2012   LLE  . Dysmenorrhea    hx endometriosis and fibroid  . Family history of breast cancer   . Headache(784.0)    tension or sinus  . History of MRSA infection prior to 2012   lasted 5-6 yr.  . Hx of nipple discharge    Bilateral.  Long standing.  Negative work up.   . Jaw snapping    states jaw pops  . Left foot drop 2016   --[facility]  . Neuromuscular disorder ([place])    neuropathy in hands,feet,face  . Neuropathy 2015   hands, feet and face--[facility]  . Prothrombin gene mutation ([place]) 2018   single gene mutation  . Right knee pain    hx meniscal injury, chondromalacia, OA  . Thyroid nodule     Past Surgical History:  Procedure  Laterality Date  . CHOLECYSTECTOMY  1990's  . DILITATION & CURRETTAGE/HYSTROSCOPY WITH NOVASURE ABLATION N/A 05/30/2014   Procedure: DILATATION & CURETTAGE/HYSTEROSCOPY WITH attempted [place] and with IUD removal ;  Surgeon: Jamey Reas de Berton Lan, MD;  Location: [facility];  Service: Gynecology;  Laterality: N/A;  . GASTRIC ROUX-EN-Y N/A 11/02/2013   Procedure: LAPAROSCOPIC ROUX-EN-Y GASTRIC BYPASS WITH UPPER ENDOSCOPY;  Surgeon: Gayland Curry, MD;  Location: [facility];  Service: General;  Laterality: N/A;  . HAND SURGERY Left    X 3 - spider bite and MRSA infection  . HYSTEROSCOPY WITH RESECTOSCOPE N/A 05/30/2014   Procedure: HYSTEROSCOPY WITH RESECTOSCOPE;  Surgeon: Jamey Reas de Berton Lan, MD;  Location: [facility];  Service: Gynecology;  Laterality: N/A;  . KNEE ARTHROSCOPY Right 12/29/2012   Procedure: RIGHT KNEE ARTHROSCOPY  PARTIAL MEDIAL AND LATERAL MENISECTOMY WITH CHONDROPLASTY;  Surgeon: Hessie Dibble, MD;  Location: [facility];  Service: Orthopedics;  Laterality: Right;  . PELVIC LAPAROSCOPY  1990's   d/t endometriosis  . TONSILLECTOMY  as child  . TOTAL KNEE ARTHROPLASTY Left 07/22/2017  . TOTAL KNEE ARTHROPLASTY Left 07/22/2017   Procedure: TOTAL KNEE ARTHROPLASTY;  Surgeon: Melrose Nakayama, MD;  Location: [facility];  Service: Orthopedics;  Laterality: Left;  . WRIST SURGERY Right 2005    Family History  Problem Relation Age of Onset  . Diabetes Mother  Living  . Kidney disease Mother   . Hypertension Mother   . Breast cancer Mother 21       dx'd with breast ca x2; 2nd around 74  . Diabetes Maternal Grandmother 85       dec  . Breast cancer Maternal Grandmother        dx in her 34s  . Arthritis Father   . Healthy Sister   . Healthy Sister   . Diabetes Maternal Grandfather   . Stroke Maternal Grandfather   . Healthy Daughter   . Breast cancer Other        2 great aunts  . Thyroid disease Neg Hx     SOCIAL HX: see  hpi   Current Outpatient Medications:  .  Acetaminophen 167 MG/5ML LIQD, Take by mouth every 8 (eight) hours as needed (pain)., Disp: , Rfl:  .  benzonatate (TESSALON PERLES) 100 MG capsule, Take 1 capsule (100 mg total) by mouth 3 (three) times daily as needed., Disp: 20 capsule, Rfl: 0 .  Calcium Carb-Cholecalciferol (CALCIUM 600 + D PO), Take 1 tablet by mouth 2 (two) times daily., Disp: , Rfl:  .  DULoxetine (CYMBALTA) 60 MG capsule, Take 120 mg by mouth every morning. , Disp: , Rfl:  .  LYRICA 25 MG capsule, Take 25 mg by mouth 3 (three) times daily. Take 3 times daily, Disp: , Rfl:  .  Melatonin 1 MG SUBL, Place 3 mg under the tongue at bedtime., Disp: 1 tablet, Rfl: 2 .  Multiple Vitamin (MULTIVITAMIN) tablet, Take 1 tablet by mouth 2 (two) times daily. , Disp: , Rfl:  .  Vitamin D, Ergocalciferol, (DRISDOL) 50000 units CAPS capsule, Take 1 capsule (50,000 Units total) by mouth every 7 (seven) days., Disp: 12 capsule, Rfl: 0  EXAM:  Vitals:   12/30/17 1507  BP: 110/70  Pulse: 86  Temp: 97.9 F (36.6 C)  SpO2: 98%    Body mass index is 37.63 kg/m.  GENERAL: vitals reviewed and listed above, alert, oriented, appears well hydrated and in no acute distress, some brown discoloration taste bud mid tongue, one small aphthous alcer vs mucocele L soft palate  HEENT: atraumatic, conjunttiva clear, no obvious abnormalities on inspection of external nose and ears  NECK: no obvious masses on inspection  LUNGS: clear to auscultation bilaterally, no wheezes, rales or rhonchi, good air movement  CV: HRRR, no peripheral edema  MS: moves all extremities without noticeable abnormality  PSYCH: pleasant and cooperative, no obvious depression or anxiety  ASSESSMENT AND PLAN:  Discussed the following assessment and plan:  Tongue discoloration  Ulcer mouth  Chronic fatigue  -for tongue advised brushing tongue - may be discolored from coffee she drinks - does not look like  thrush -ulcer may be from recent viral illness or mucocele - topical swish -advised to call in 1 week if either of these issues not resolved -for chronic issues had discussion of options and she may try seeing integrative specialist at [facility] - advised to let me know if needs help with setting this up -Patient advised to return or notify a doctor immediately if symptoms worsen or persist or new concerns arise.  Patient Instructions  Brush tongue  Can garle/swish with half water, half hydrogen peroxide once daily  Call in 1 week if symptoms persist and will have you see Ear, nose and throat specialist.   Lucretia Kern, DO

## 2017-12-30 NOTE — Patient Instructions (Signed)
Brush tongue  Can garle/swish with half water, half hydrogen peroxide once daily  Call in 1 week if symptoms persist and will have you see Ear, nose and throat specialist.

## 2018-01-07 ENCOUNTER — Ambulatory Visit
Admission: RE | Admit: 2018-01-07 | Discharge: 2018-01-07 | Disposition: A | Discharge status: Home / Self Care | Payer: BLUE CROSS/BLUE SHIELD | Source: Ambulatory Visit | Attending: Obstetrics and Gynecology | Admitting: Obstetrics and Gynecology

## 2018-01-07 DIAGNOSIS — Z1231 Encounter for screening mammogram for malignant neoplasm of breast: Secondary | ICD-10-CM

## 2018-01-21 DIAGNOSIS — Z09 Encounter for follow-up examination after completed treatment for conditions other than malignant neoplasm: Secondary | ICD-10-CM | POA: Diagnosis not present

## 2018-01-21 DIAGNOSIS — Z96652 Presence of left artificial knee joint: Secondary | ICD-10-CM | POA: Diagnosis not present

## 2018-01-21 DIAGNOSIS — M1712 Unilateral primary osteoarthritis, left knee: Secondary | ICD-10-CM | POA: Diagnosis not present

## 2018-02-17 ENCOUNTER — Encounter: Payer: Self-pay | Admitting: Family Medicine

## 2018-02-25 ENCOUNTER — Other Ambulatory Visit: Payer: BLUE CROSS/BLUE SHIELD

## 2018-02-25 ENCOUNTER — Ambulatory Visit: Payer: BLUE CROSS/BLUE SHIELD | Admitting: Family

## 2018-03-19 DIAGNOSIS — M1990 Unspecified osteoarthritis, unspecified site: Secondary | ICD-10-CM | POA: Diagnosis not present

## 2018-03-19 DIAGNOSIS — E669 Obesity, unspecified: Secondary | ICD-10-CM | POA: Diagnosis not present

## 2018-03-19 DIAGNOSIS — R899 Unspecified abnormal finding in specimens from other organs, systems and tissues: Secondary | ICD-10-CM | POA: Diagnosis not present

## 2018-03-19 DIAGNOSIS — R202 Paresthesia of skin: Secondary | ICD-10-CM | POA: Diagnosis not present

## 2018-03-19 DIAGNOSIS — E538 Deficiency of other specified B group vitamins: Secondary | ICD-10-CM | POA: Diagnosis not present

## 2018-03-19 DIAGNOSIS — M79602 Pain in left arm: Secondary | ICD-10-CM | POA: Diagnosis not present

## 2018-03-19 DIAGNOSIS — M79605 Pain in left leg: Secondary | ICD-10-CM | POA: Diagnosis not present

## 2018-03-19 DIAGNOSIS — M79601 Pain in right arm: Secondary | ICD-10-CM | POA: Diagnosis not present

## 2018-03-19 DIAGNOSIS — G8929 Other chronic pain: Secondary | ICD-10-CM | POA: Diagnosis not present

## 2018-03-19 DIAGNOSIS — R200 Anesthesia of skin: Secondary | ICD-10-CM | POA: Diagnosis not present

## 2018-03-19 DIAGNOSIS — E1169 Type 2 diabetes mellitus with other specified complication: Secondary | ICD-10-CM | POA: Diagnosis not present

## 2018-03-19 DIAGNOSIS — M79604 Pain in right leg: Secondary | ICD-10-CM | POA: Diagnosis not present

## 2018-03-19 DIAGNOSIS — Z9884 Bariatric surgery status: Secondary | ICD-10-CM | POA: Diagnosis not present

## 2018-03-20 ENCOUNTER — Other Ambulatory Visit: Payer: BLUE CROSS/BLUE SHIELD

## 2018-03-20 ENCOUNTER — Ambulatory Visit: Payer: BLUE CROSS/BLUE SHIELD | Admitting: Family

## 2018-03-25 DIAGNOSIS — G5730 Lesion of lateral popliteal nerve, unspecified lower limb: Secondary | ICD-10-CM | POA: Diagnosis not present

## 2018-03-30 DIAGNOSIS — M1711 Unilateral primary osteoarthritis, right knee: Secondary | ICD-10-CM | POA: Diagnosis not present

## 2018-03-30 DIAGNOSIS — Z09 Encounter for follow-up examination after completed treatment for conditions other than malignant neoplasm: Secondary | ICD-10-CM | POA: Diagnosis not present

## 2018-03-30 DIAGNOSIS — Z96652 Presence of left artificial knee joint: Secondary | ICD-10-CM | POA: Diagnosis not present

## 2018-04-01 DIAGNOSIS — M79602 Pain in left arm: Secondary | ICD-10-CM | POA: Diagnosis not present

## 2018-04-01 DIAGNOSIS — M79601 Pain in right arm: Secondary | ICD-10-CM | POA: Diagnosis not present

## 2018-04-01 DIAGNOSIS — G8929 Other chronic pain: Secondary | ICD-10-CM | POA: Diagnosis not present

## 2018-04-08 DIAGNOSIS — R200 Anesthesia of skin: Secondary | ICD-10-CM | POA: Diagnosis not present

## 2018-04-08 DIAGNOSIS — M1990 Unspecified osteoarthritis, unspecified site: Secondary | ICD-10-CM | POA: Diagnosis not present

## 2018-04-08 DIAGNOSIS — R202 Paresthesia of skin: Secondary | ICD-10-CM | POA: Diagnosis not present

## 2018-04-08 DIAGNOSIS — E042 Nontoxic multinodular goiter: Secondary | ICD-10-CM | POA: Diagnosis not present

## 2018-04-08 DIAGNOSIS — E538 Deficiency of other specified B group vitamins: Secondary | ICD-10-CM | POA: Diagnosis not present

## 2018-04-08 DIAGNOSIS — M79602 Pain in left arm: Secondary | ICD-10-CM | POA: Diagnosis not present

## 2018-04-08 DIAGNOSIS — E559 Vitamin D deficiency, unspecified: Secondary | ICD-10-CM | POA: Diagnosis not present

## 2018-04-08 DIAGNOSIS — G5730 Lesion of lateral popliteal nerve, unspecified lower limb: Secondary | ICD-10-CM | POA: Diagnosis not present

## 2018-04-08 DIAGNOSIS — R899 Unspecified abnormal finding in specimens from other organs, systems and tissues: Secondary | ICD-10-CM | POA: Diagnosis not present

## 2018-04-08 DIAGNOSIS — E1169 Type 2 diabetes mellitus with other specified complication: Secondary | ICD-10-CM | POA: Diagnosis not present

## 2018-04-08 DIAGNOSIS — Z713 Dietary counseling and surveillance: Secondary | ICD-10-CM | POA: Diagnosis not present

## 2018-04-08 DIAGNOSIS — G8929 Other chronic pain: Secondary | ICD-10-CM | POA: Diagnosis not present

## 2018-04-08 DIAGNOSIS — F418 Other specified anxiety disorders: Secondary | ICD-10-CM | POA: Diagnosis not present

## 2018-04-08 DIAGNOSIS — M79601 Pain in right arm: Secondary | ICD-10-CM | POA: Diagnosis not present

## 2018-04-08 DIAGNOSIS — E669 Obesity, unspecified: Secondary | ICD-10-CM | POA: Diagnosis not present

## 2018-04-21 DIAGNOSIS — R202 Paresthesia of skin: Secondary | ICD-10-CM | POA: Diagnosis not present

## 2018-04-21 DIAGNOSIS — R51 Headache: Secondary | ICD-10-CM | POA: Diagnosis not present

## 2018-04-21 DIAGNOSIS — R200 Anesthesia of skin: Secondary | ICD-10-CM | POA: Diagnosis not present

## 2018-04-30 ENCOUNTER — Other Ambulatory Visit: Payer: Self-pay | Admitting: Orthopaedic Surgery

## 2018-05-09 DIAGNOSIS — R51 Headache: Secondary | ICD-10-CM | POA: Diagnosis not present

## 2018-05-09 DIAGNOSIS — R200 Anesthesia of skin: Secondary | ICD-10-CM | POA: Diagnosis not present

## 2018-05-13 NOTE — Pre-Procedure Instructions (Signed)
[name]  05/13/2018      [address] [place] [address] Phone: [phone] Fax: [phone]  [name] [facility] [address], [place] [address] Phone: [phone] Fax: [phone]    Your procedure is scheduled on October 15th.  Report to [facility] Admitting at [email or web address].M.  Call this number if you have problems the morning of surgery:  [phone]   Remember:  Do not eat or drink after midnight.    Take these medicines the morning of surgery with A SIP OF WATER   Tylenol (if needed), Cymbalta  7 days prior to surgery STOP taking any Aspirin(unless otherwise instructed by your surgeon), Aleve, Naproxen, Ibuprofen, Motrin, Advil, Goody's, BC's, all herbal medications, fish oil, and all vitamins    Do not wear jewelry, make-up or nail polish.  Do not wear lotions, powders, or perfumes, or deodorant.  Do not shave 48 hours prior to surgery.  Men may shave face and neck.  Do not bring valuables to the hospital.  [facility] is not responsible for any belongings or valuables.  Contacts, dentures or bridgework may not be worn into surgery.  Leave your suitcase in the car.  After surgery it may be brought to your room.  For patients admitted to the hospital, discharge time will be determined by your treatment team.  Patients discharged the day of surgery will not be allowed to drive home.    - Preparing For Surgery  Before surgery, you can play an important role. Because skin is not sterile, your skin needs to be as free of germs as possible. You can reduce the number of germs on your skin by washing with CHG (chlorahexidine gluconate) Soap before surgery.  CHG is an antiseptic cleaner which kills germs and bonds with the skin to continue killing germs even after washing.    Oral Hygiene is also important to  reduce your risk of infection.  Remember - BRUSH YOUR TEETH THE MORNING OF SURGERY WITH YOUR REGULAR TOOTHPASTE  Please do not use if you have an allergy to CHG or antibacterial soaps. If your skin becomes reddened/irritated stop using the CHG.  Do not shave (including legs and underarms) for at least 48 hours prior to first CHG shower. It is OK to shave your face.  Please follow these instructions carefully.   1. Shower the NIGHT BEFORE SURGERY and the MORNING OF SURGERY with CHG.   2. If you chose to wash your hair, wash your hair first as usual with your normal shampoo.  3. After you shampoo, rinse your hair and body thoroughly to remove the shampoo.  4. Use CHG as you would any other liquid soap. You can apply CHG directly to the skin and wash gently with a scrungie or a clean washcloth.   5. Apply the CHG Soap to your body ONLY FROM THE NECK DOWN.  Do not use on open wounds or open sores. Avoid contact with your eyes, ears, mouth and genitals (private parts). Wash Face and genitals (private parts)  with your normal soap.  6. Wash thoroughly, paying special attention to the area where your surgery will be performed.  7. Thoroughly rinse your body with warm water from the neck down.  8. DO NOT shower/wash with your normal soap after using and rinsing off the CHG Soap.  9.  Pat yourself dry with a CLEAN TOWEL.  10. Wear CLEAN PAJAMAS to bed the night before surgery, wear comfortable clothes the morning of surgery  11. Place CLEAN SHEETS on your bed the night of your first shower and DO NOT SLEEP WITH PETS.    Day of Surgery:  Do not apply any deodorants/lotions.  Please wear clean clothes to the hospital/surgery center.   Remember to brush your teeth WITH YOUR REGULAR TOOTHPASTE.    Please read over the following fact sheets that you were given.

## 2018-05-15 ENCOUNTER — Other Ambulatory Visit (HOSPITAL_COMMUNITY): Payer: BLUE CROSS/BLUE SHIELD

## 2018-05-15 ENCOUNTER — Encounter (HOSPITAL_COMMUNITY)
Admission: RE | Admit: 2018-05-15 | Discharge: 2018-05-15 | Disposition: A | Discharge status: Home / Self Care | Payer: BLUE CROSS/BLUE SHIELD | Source: Ambulatory Visit | Attending: Orthopaedic Surgery | Admitting: Orthopaedic Surgery

## 2018-05-15 ENCOUNTER — Encounter (HOSPITAL_COMMUNITY): Payer: Self-pay

## 2018-05-15 DIAGNOSIS — Z01812 Encounter for preprocedural laboratory examination: Secondary | ICD-10-CM | POA: Diagnosis not present

## 2018-05-15 LAB — URINALYSIS, ROUTINE W REFLEX MICROSCOPIC
Bilirubin Urine: NEGATIVE
GLUCOSE, UA: NEGATIVE mg/dL
Hgb urine dipstick: NEGATIVE
KETONES UR: NEGATIVE mg/dL
LEUKOCYTES UA: NEGATIVE
Nitrite: NEGATIVE
PROTEIN: NEGATIVE mg/dL
Specific Gravity, Urine: 1.013 (ref 1.005–1.030)
pH: 6 (ref 5.0–8.0)

## 2018-05-15 LAB — CBC WITH DIFFERENTIAL/PLATELET
Abs Immature Granulocytes: 0 10*3/uL (ref 0.0–0.1)
BASOS ABS: 0.1 10*3/uL (ref 0.0–0.1)
BASOS PCT: 2 %
EOS ABS: 0.2 10*3/uL (ref 0.0–0.7)
Eosinophils Relative: 2 %
HEMATOCRIT: 43 % (ref 36.0–46.0)
Hemoglobin: 13.3 g/dL (ref 12.0–15.0)
IMMATURE GRANULOCYTES: 0 %
LYMPHS ABS: 2.5 10*3/uL (ref 0.7–4.0)
Lymphocytes Relative: 29 %
MCH: 30 pg (ref 26.0–34.0)
MCHC: 30.9 g/dL (ref 30.0–36.0)
MCV: 96.8 fL (ref 78.0–100.0)
Monocytes Absolute: 0.8 10*3/uL (ref 0.1–1.0)
Monocytes Relative: 9 %
NEUTROS PCT: 58 %
Neutro Abs: 4.9 10*3/uL (ref 1.7–7.7)
PLATELETS: 384 10*3/uL (ref 150–400)
RBC: 4.44 MIL/uL (ref 3.87–5.11)
RDW: 13.7 % (ref 11.5–15.5)
WBC: 8.5 10*3/uL (ref 4.0–10.5)

## 2018-05-15 LAB — APTT: aPTT: 28 seconds (ref 24–36)

## 2018-05-15 LAB — BASIC METABOLIC PANEL
Anion gap: 6 (ref 5–15)
BUN: 12 mg/dL (ref 6–20)
CHLORIDE: 104 mmol/L (ref 98–111)
CO2: 28 mmol/L (ref 22–32)
CREATININE: 0.82 mg/dL (ref 0.44–1.00)
Calcium: 9.2 mg/dL (ref 8.9–10.3)
GFR calc non Af Amer: >60 mL/min (ref >60)
Glucose, Bld: 95 mg/dL (ref 70–99)
Potassium: 4.2 mmol/L (ref 3.5–5.1)
SODIUM: 138 mmol/L (ref 135–145)

## 2018-05-15 LAB — PROTIME-INR
INR: 0.99
PROTHROMBIN TIME: 13 s (ref 11.4–15.2)

## 2018-05-15 LAB — TYPE AND SCREEN
ABO/RH(D): A POS
Antibody Screen: NEGATIVE

## 2018-05-15 LAB — SURGICAL PCR SCREEN
MRSA, PCR: NEGATIVE
Staphylococcus aureus: NEGATIVE

## 2018-05-20 NOTE — H&P (Signed)
TOTAL KNEE ADMISSION H&P  Patient is being admitted for right total knee arthroplasty.  Subjective:  Chief Complaint:right knee pain.  HPI: [name], 47 y.o. female, has a history of pain and functional disability in the right knee due to arthritis and has failed non-surgical conservative treatments for greater than 12 weeks to includeNSAID's and/or analgesics, corticosteriod injections, flexibility and strengthening excercises, supervised PT with diminished ADL's post treatment, use of assistive devices, weight reduction as appropriate and activity modification.  Onset of symptoms was gradual, starting 5 years ago with gradually worsening course since that time. The patient noted prior procedures on the knee to include  arthroscopy on the right knee(s).  Patient currently rates pain in the right knee(s) at 10 out of 10 with activity. Patient has night pain, worsening of pain with activity and weight bearing, pain that interferes with activities of daily living, crepitus and joint swelling.  Patient has evidence of subchondral cysts, subchondral sclerosis, periarticular osteophytes and joint space narrowing by imaging studies.  There is no active infection.  Patient Active Problem List   Diagnosis Date Noted  . Bilateral nipple discharge 09/19/2017  . Primary localized osteoarthritis of left knee 07/22/2017  . Primary osteoarthritis of left knee 07/22/2017  . IDA (iron deficiency anemia) 05/20/2017  . Effusion of left knee 05/17/2017  . Degenerative arthritis of right knee 02/13/2017  . Degenerative arthritis of left knee 02/13/2017  . Numbness and tingling of both lower extremities 10/18/2016  . Genetic testing 10/18/2016  . Family history of breast cancer   . Multinodular goiter 04/24/2015  . Mood disorder ([place]) 04/12/2015  . Peroneal mononeuropathy 08/22/2014  . Left foot drop 07/21/2014  . Menorrhagia with irregular cycle 04/29/2014  . S/P gastric bypass 11/02/2013  .  Hyperlipidemia 10/08/2013  . Primary localized osteoarthrosis, lower leg 07/06/2013  . Obesity, BMI unknown 04/23/2013  . IUD contraception - inserted summer 2013 04/09/2013   Past Medical History:  Diagnosis Date  . Anxiety and depression   . Arthritis   . Complication of anesthesia    has been hard to wake up post-op  . Dental crowns present   . Depression   . Diabetes ([place])    resolved with gastric bypass, no meds  . DVT of lower extremity (deep venous thrombosis) ([place]) 2012   LLE  . Dysmenorrhea    hx endometriosis and fibroid  . Family history of breast cancer   . Headache(784.0)    tension or sinus  . History of MRSA infection prior to 2012   lasted 5-6 yr.  . Hx of nipple discharge    Bilateral.  Long standing.  Negative work up.   . Jaw snapping    states jaw pops  . Left foot drop 2016   --[facility]  . Neuromuscular disorder ([place])    neuropathy in hands,feet,face  . Neuropathy 2015   hands, feet and face--[facility]  . Prothrombin gene mutation ([place]) 2018   single gene mutation  . Right knee pain    hx meniscal injury, chondromalacia, OA  . Thyroid nodule     Past Surgical History:  Procedure Laterality Date  . CHOLECYSTECTOMY  1990's  . DILITATION & CURRETTAGE/HYSTROSCOPY WITH NOVASURE ABLATION N/A 05/30/2014   Procedure: DILATATION & CURETTAGE/HYSTEROSCOPY WITH attempted [place] and with IUD removal ;  Surgeon: Jamey Reas de Berton Lan, MD;  Location: [facility];  Service: Gynecology;  Laterality: N/A;  . GASTRIC ROUX-EN-Y N/A 11/02/2013   Procedure: LAPAROSCOPIC  ROUX-EN-Y GASTRIC BYPASS WITH UPPER ENDOSCOPY;  Surgeon: Gayland Curry, MD;  Location: [facility];  Service: General;  Laterality: N/A;  . HAND SURGERY Left    X 3 - spider bite and MRSA infection  . HYSTEROSCOPY WITH RESECTOSCOPE N/A 05/30/2014   Procedure: HYSTEROSCOPY WITH RESECTOSCOPE;  Surgeon: Jamey Reas de Berton Lan, MD;  Location: [facility];  Service:  Gynecology;  Laterality: N/A;  . KNEE ARTHROSCOPY Right 12/29/2012   Procedure: RIGHT KNEE ARTHROSCOPY  PARTIAL MEDIAL AND LATERAL MENISECTOMY WITH CHONDROPLASTY;  Surgeon: Hessie Dibble, MD;  Location: [facility];  Service: Orthopedics;  Laterality: Right;  . PELVIC LAPAROSCOPY  1990's   d/t endometriosis  . TONSILLECTOMY  as child  . TOTAL KNEE ARTHROPLASTY Left 07/22/2017  . TOTAL KNEE ARTHROPLASTY Left 07/22/2017   Procedure: TOTAL KNEE ARTHROPLASTY;  Surgeon: Melrose Nakayama, MD;  Location: [facility];  Service: Orthopedics;  Laterality: Left;  . WRIST SURGERY Right 2005    No current facility-administered medications for this encounter.    Current Outpatient Medications  Medication Sig Dispense Refill Last Dose  . Acetaminophen (ACETAMINOPHEN EXTRA STRENGTH) 167 MG/5ML LIQD Take 500 mg by mouth 2 (two) times daily as needed (for headache or pain).     . DULoxetine (CYMBALTA) 60 MG capsule Take 120 mg by mouth every morning.    Taking  . Multiple Vitamin (MULTIVITAMIN) tablet Take 2 tablets by mouth 2 (two) times daily.    Taking  . Melatonin 1 MG SUBL Place 3 mg under the tongue at bedtime. (Patient not taking: Reported on 05/11/2018) 1 tablet 2 Not Taking at Unknown time  . Vitamin D, Ergocalciferol, (DRISDOL) 50000 units CAPS capsule Take 1 capsule (50,000 Units total) by mouth every 7 (seven) days. (Patient not taking: Reported on 05/11/2018) 12 capsule 0 Not Taking at Unknown time   Allergies  Allergen Reactions  . Bactrim [Sulfamethoxazole-Trimethoprim] Other (See Comments)    HALLUCINATIONS, FEVER, CHILLS  . Ibuprofen Other (See Comments)    Had gastric bypass. Told not to take NSAIDs.  . Lamotrigine Hives  . Sulfa Antibiotics Other (See Comments)    HALLUCINATIONS, FEVER, CHILLS      Social History   Tobacco Use  . Smoking status: Never Smoker  . Smokeless tobacco: Never Used  Substance Use Topics  . Alcohol use: Yes    Alcohol/week: 2.0 - 4.0 standard  drinks    Types: 2 - 4 Glasses of wine per week    Comment: 2-4 glasses of wine a few times per month    Family History  Problem Relation Age of Onset  . Diabetes Mother        Living  . Kidney disease Mother   . Hypertension Mother   . Breast cancer Mother 4       dx'd with breast ca x2; 2nd around 10  . Diabetes Maternal Grandmother 42       dec  . Breast cancer Maternal Grandmother        dx in her 50s  . Arthritis Father   . Healthy Sister   . Healthy Sister   . Diabetes Maternal Grandfather   . Stroke Maternal Grandfather   . Healthy Daughter   . Breast cancer Other        2 great aunts  . Thyroid disease Neg Hx      Review of Systems  Musculoskeletal: Positive for joint pain.       Right knee  All other  systems reviewed and are negative.   Objective:  Physical Exam  Constitutional: She is oriented to person, place, and time. She appears well-developed and well-nourished.  HENT:  Head: Normocephalic and atraumatic.  Eyes: Pupils are equal, round, and reactive to light.  Neck: Normal range of motion.  Cardiovascular: Normal rate and regular rhythm.  Respiratory: Effort normal.  GI: Soft.  Musculoskeletal:  Examination of the right knee shows range of motion from 0-100 of flexion.  Mild crepitation.  She has tennis palpation along her joint line.  Her ligaments are stable.  Her calf is soft and nontender.  She is neurovascularly intact distally.  Neurological: She is alert and oriented to person, place, and time.  Skin: Skin is warm and dry.  Psychiatric: She has a normal mood and affect. Her behavior is normal. Judgment and thought content normal.    Vital signs in last 24 hours:    Labs:   Estimated body mass index is 38.91 kg/m as calculated from the following:   Height as of 05/15/18: 5\' 8"  (1.727 m).   Weight as of 05/15/18: 116.1 kg.   Imaging Review Plain radiographs demonstrate severe degenerative joint disease of the right knee(s). The  overall alignment isneutral. The bone quality appears to be good for age and reported activity level.   Preoperative templating of the joint replacement has been completed, documented, and submitted to the Operating Room personnel in order to optimize intra-operative equipment management.   Anticipated LOS equal to or greater than 2 midnights due to - Age 24 and older with one or more of the following:  - Obesity  - Expected need for hospital services (PT, OT, Nursing) required for safe  discharge  - Anticipated need for postoperative skilled nursing care or inpatient rehab  - Active co-morbidities: DVT/VTE  Assessment/Plan:  End stage primary arthritis, right knee   The patient history, physical examination, clinical judgment of the provider and imaging studies are consistent with end stage degenerative joint disease of the right knee(s) and total knee arthroplasty is deemed medically necessary. The treatment options including medical management, injection therapy arthroscopy and arthroplasty were discussed at length. The risks and benefits of total knee arthroplasty were presented and reviewed. The risks due to aseptic loosening, infection, stiffness, patella tracking problems, thromboembolic complications and other imponderables were discussed. The patient acknowledged the explanation, agreed to proceed with the plan and consent was signed. Patient is being admitted for inpatient treatment for surgery, pain control, PT, OT, prophylactic antibiotics, VTE prophylaxis, progressive ambulation and ADL's and discharge planning. The patient is planning to be discharged home with home health services

## 2018-05-25 MED ORDER — TRANEXAMIC ACID-NACL 1000-0.7 MG/100ML-% IV SOLN
1000.0000 mg | INTRAVENOUS | Status: AC
  Administered 2018-05-26: 1000 mg via INTRAVENOUS
  Filled 2018-05-25: qty 100

## 2018-05-25 MED ORDER — BUPIVACAINE LIPOSOME 1.3 % IJ SUSP
20.0000 mL | INTRAMUSCULAR | Status: AC
  Administered 2018-05-26: 20 mL
  Filled 2018-05-25: qty 20

## 2018-05-25 MED ORDER — CEFAZOLIN SODIUM-DEXTROSE 2-4 GM/100ML-% IV SOLN
2.0000 g | INTRAVENOUS | Status: AC
  Administered 2018-05-26: 2 g via INTRAVENOUS
  Filled 2018-05-25: qty 100

## 2018-05-25 MED ORDER — TRANEXAMIC ACID 1000 MG/10ML IV SOLN
2000.0000 mg | INTRAVENOUS | Status: AC
  Administered 2018-05-26: 2000 mg via TOPICAL
  Filled 2018-05-25: qty 20

## 2018-05-25 NOTE — Anesthesia Preprocedure Evaluation (Addendum)
Anesthesia Evaluation  Patient identified by MRN, date of birth, ID band Patient awake    Reviewed: Allergy & Precautions, H&P , NPO status , Patient's Chart, lab work & pertinent test results, reviewed documented beta blocker date and time   History of Anesthesia Complications Negative for: history of anesthetic complications  Airway Mallampati: II  TM Distance: >3 FB Neck ROM: Full    Dental no notable dental hx. (+) Dental Advisory Given   Pulmonary neg pulmonary ROS,    Pulmonary exam normal        Cardiovascular negative cardio ROS Normal cardiovascular exam     Neuro/Psych  Headaches, PSYCHIATRIC DISORDERS Anxiety Depression    GI/Hepatic negative GI ROS, Neg liver ROS,   Endo/Other  diabetesMorbid obesity  Renal/GU negative Renal ROS     Musculoskeletal  (+) Arthritis , Osteoarthritis,  Neuropathy to bilateral legs Weakness to left foot with foot drop Motor and sensation intact to left foot   Abdominal (+) - obese,   Peds  Hematology negative hematology ROS (+)   Anesthesia Other Findings   Reproductive/Obstetrics                            Anesthesia Physical  Anesthesia Plan  ASA: II  Anesthesia Plan: Spinal and Regional   Post-op Pain Management:  Regional for Post-op pain   Induction:   PONV Risk Score and Plan: 2 and Dexamethasone, Ondansetron, Propofol infusion and Treatment may vary due to age or medical condition  Airway Management Planned: Natural Airway  Additional Equipment:   Intra-op Plan:   Post-operative Plan:   Informed Consent: I have reviewed the patients History and Physical, chart, labs and discussed the procedure including the risks, benefits and alternatives for the proposed anesthesia with the patient or authorized representative who has indicated his/her understanding and acceptance.   Dental advisory given  Plan Discussed with: CRNA,  Anesthesiologist and Surgeon  Anesthesia Plan Comments:        Anesthesia Quick Evaluation

## 2018-05-26 ENCOUNTER — Ambulatory Visit (HOSPITAL_COMMUNITY): Payer: BLUE CROSS/BLUE SHIELD | Admitting: Anesthesiology

## 2018-05-26 ENCOUNTER — Encounter (HOSPITAL_COMMUNITY)
Admission: RE | Disposition: A | Discharge status: Home / Self Care | Payer: Self-pay | Source: Ambulatory Visit | Attending: Orthopaedic Surgery

## 2018-05-26 ENCOUNTER — Encounter (HOSPITAL_COMMUNITY): Payer: Self-pay

## 2018-05-26 ENCOUNTER — Observation Stay (HOSPITAL_COMMUNITY)
Admission: RE | Admit: 2018-05-26 | Discharge: 2018-05-27 | Disposition: A | Discharge status: Home / Self Care | Payer: BLUE CROSS/BLUE SHIELD | Source: Ambulatory Visit | Attending: Orthopaedic Surgery | Admitting: Orthopaedic Surgery

## 2018-05-26 ENCOUNTER — Other Ambulatory Visit: Payer: Self-pay

## 2018-05-26 DIAGNOSIS — F329 Major depressive disorder, single episode, unspecified: Secondary | ICD-10-CM | POA: Diagnosis not present

## 2018-05-26 DIAGNOSIS — Z23 Encounter for immunization: Secondary | ICD-10-CM | POA: Diagnosis not present

## 2018-05-26 DIAGNOSIS — Z86718 Personal history of other venous thrombosis and embolism: Secondary | ICD-10-CM | POA: Insufficient documentation

## 2018-05-26 DIAGNOSIS — Z9884 Bariatric surgery status: Secondary | ICD-10-CM | POA: Insufficient documentation

## 2018-05-26 DIAGNOSIS — F419 Anxiety disorder, unspecified: Secondary | ICD-10-CM | POA: Diagnosis not present

## 2018-05-26 DIAGNOSIS — Z6838 Body mass index (BMI) 38.0-38.9, adult: Secondary | ICD-10-CM | POA: Insufficient documentation

## 2018-05-26 DIAGNOSIS — E669 Obesity, unspecified: Secondary | ICD-10-CM | POA: Insufficient documentation

## 2018-05-26 DIAGNOSIS — Z882 Allergy status to sulfonamides status: Secondary | ICD-10-CM | POA: Insufficient documentation

## 2018-05-26 DIAGNOSIS — M1711 Unilateral primary osteoarthritis, right knee: Principal | ICD-10-CM | POA: Insufficient documentation

## 2018-05-26 DIAGNOSIS — Z8614 Personal history of Methicillin resistant Staphylococcus aureus infection: Secondary | ICD-10-CM | POA: Diagnosis not present

## 2018-05-26 DIAGNOSIS — G8918 Other acute postprocedural pain: Secondary | ICD-10-CM | POA: Diagnosis not present

## 2018-05-26 HISTORY — PX: TOTAL KNEE ARTHROPLASTY: SHX125

## 2018-05-26 LAB — POCT PREGNANCY, URINE: PREG TEST UR: NEGATIVE

## 2018-05-26 LAB — GLUCOSE, CAPILLARY: GLUCOSE-CAPILLARY: 118 mg/dL — AB (ref 70–99)

## 2018-05-26 SURGERY — ARTHROPLASTY, KNEE, TOTAL
Anesthesia: Regional | Site: Knee | Laterality: Right

## 2018-05-26 MED ORDER — ONDANSETRON HCL 4 MG/2ML IJ SOLN
INTRAMUSCULAR | Status: DC | PRN
  Administered 2018-05-26: 4 mg via INTRAVENOUS

## 2018-05-26 MED ORDER — LACTATED RINGERS IV SOLN
INTRAVENOUS | Status: DC
  Administered 2018-05-26: 21:00:00 via INTRAVENOUS

## 2018-05-26 MED ORDER — PROMETHAZINE HCL 25 MG/ML IJ SOLN: 6.2500 mg | INTRAMUSCULAR | Status: DC | PRN

## 2018-05-26 MED ORDER — BISACODYL 5 MG PO TBEC: 5.0000 mg | DELAYED_RELEASE_TABLET | Freq: Every day | ORAL | Status: DC | PRN

## 2018-05-26 MED ORDER — INFLUENZA VAC SPLIT QUAD 0.5 ML IM SUSY
0.5000 mL | PREFILLED_SYRINGE | INTRAMUSCULAR | Status: AC
  Administered 2018-05-27: 0.5 mL via INTRAMUSCULAR
  Filled 2018-05-26: qty 0.5

## 2018-05-26 MED ORDER — FENTANYL CITRATE (PF) 100 MCG/2ML IJ SOLN
25.0000 ug | INTRAMUSCULAR | Status: DC | PRN
  Administered 2018-05-26: 25 ug via INTRAVENOUS
  Administered 2018-05-26: 50 ug via INTRAVENOUS

## 2018-05-26 MED ORDER — ROPIVACAINE HCL 7.5 MG/ML IJ SOLN
INTRAMUSCULAR | Status: DC | PRN
  Administered 2018-05-26: 20 mL via PERINEURAL

## 2018-05-26 MED ORDER — MENTHOL 3 MG MT LOZG: 1.0000 | LOZENGE | OROMUCOSAL | Status: DC | PRN

## 2018-05-26 MED ORDER — FENTANYL CITRATE (PF) 100 MCG/2ML IJ SOLN
INTRAMUSCULAR | Status: DC | PRN
  Administered 2018-05-26 (×2): 50 ug via INTRAVENOUS

## 2018-05-26 MED ORDER — ACETAMINOPHEN 325 MG PO TABS
325.0000 mg | ORAL_TABLET | Freq: Four times a day (QID) | ORAL | Status: DC | PRN
  Administered 2018-05-27 (×2): 650 mg via ORAL
  Filled 2018-05-26 (×2): qty 2

## 2018-05-26 MED ORDER — ASPIRIN EC 325 MG PO TBEC
325.0000 mg | DELAYED_RELEASE_TABLET | Freq: Two times a day (BID) | ORAL | Status: DC
  Administered 2018-05-27: 325 mg via ORAL
  Filled 2018-05-26: qty 1

## 2018-05-26 MED ORDER — BUPIVACAINE-EPINEPHRINE 0.5% -1:200000 IJ SOLN
INTRAMUSCULAR | Status: AC
  Filled 2018-05-26: qty 1

## 2018-05-26 MED ORDER — HYDROCODONE-ACETAMINOPHEN 5-325 MG PO TABS
1.0000 | ORAL_TABLET | ORAL | Status: DC | PRN
  Administered 2018-05-26 (×2): 2 via ORAL
  Filled 2018-05-26 (×2): qty 2

## 2018-05-26 MED ORDER — ONDANSETRON HCL 4 MG PO TABS: 4.0000 mg | ORAL_TABLET | Freq: Four times a day (QID) | ORAL | Status: DC | PRN

## 2018-05-26 MED ORDER — SODIUM CHLORIDE 0.9 % IV SOLN
INTRAVENOUS | Status: DC | PRN
  Administered 2018-05-26: 15 ug/min via INTRAVENOUS

## 2018-05-26 MED ORDER — METHOCARBAMOL 500 MG PO TABS
500.0000 mg | ORAL_TABLET | Freq: Four times a day (QID) | ORAL | Status: DC | PRN
  Administered 2018-05-26 – 2018-05-27 (×4): 500 mg via ORAL
  Filled 2018-05-26 (×4): qty 1

## 2018-05-26 MED ORDER — METOCLOPRAMIDE HCL 5 MG PO TABS: 5.0000 mg | ORAL_TABLET | Freq: Three times a day (TID) | ORAL | Status: DC | PRN

## 2018-05-26 MED ORDER — MORPHINE SULFATE (PF) 2 MG/ML IV SOLN: 0.5000 mg | INTRAVENOUS | Status: DC | PRN

## 2018-05-26 MED ORDER — CHLORHEXIDINE GLUCONATE 4 % EX LIQD: 60.0000 mL | Freq: Once | CUTANEOUS | Status: DC

## 2018-05-26 MED ORDER — FENTANYL CITRATE (PF) 250 MCG/5ML IJ SOLN
INTRAMUSCULAR | Status: AC
  Filled 2018-05-26: qty 5

## 2018-05-26 MED ORDER — PROPOFOL 10 MG/ML IV BOLUS
INTRAVENOUS | Status: AC
  Filled 2018-05-26: qty 20

## 2018-05-26 MED ORDER — SODIUM CHLORIDE 0.9% FLUSH
INTRAVENOUS | Status: DC | PRN
  Administered 2018-05-26: 30 mL via INTRAVENOUS

## 2018-05-26 MED ORDER — DIPHENHYDRAMINE HCL 12.5 MG/5ML PO ELIX: 12.5000 mg | ORAL_SOLUTION | ORAL | Status: DC | PRN

## 2018-05-26 MED ORDER — ONDANSETRON HCL 4 MG/2ML IJ SOLN
INTRAMUSCULAR | Status: AC
  Filled 2018-05-26: qty 2

## 2018-05-26 MED ORDER — DULOXETINE HCL 60 MG PO CPEP
120.0000 mg | ORAL_CAPSULE | Freq: Every morning | ORAL | Status: DC
  Administered 2018-05-27: 120 mg via ORAL
  Filled 2018-05-26: qty 2

## 2018-05-26 MED ORDER — BUPIVACAINE-EPINEPHRINE (PF) 0.5% -1:200000 IJ SOLN
INTRAMUSCULAR | Status: DC | PRN
  Administered 2018-05-26: 30 mL via PERINEURAL

## 2018-05-26 MED ORDER — DOCUSATE SODIUM 100 MG PO CAPS
100.0000 mg | ORAL_CAPSULE | Freq: Two times a day (BID) | ORAL | Status: DC
  Administered 2018-05-26 – 2018-05-27 (×2): 100 mg via ORAL
  Filled 2018-05-26 (×2): qty 1

## 2018-05-26 MED ORDER — FENTANYL CITRATE (PF) 100 MCG/2ML IJ SOLN
INTRAMUSCULAR | Status: AC
  Filled 2018-05-26: qty 2

## 2018-05-26 MED ORDER — SODIUM CHLORIDE 0.9 % IR SOLN
Status: DC | PRN
  Administered 2018-05-26: 3000 mL

## 2018-05-26 MED ORDER — DEXAMETHASONE SODIUM PHOSPHATE 10 MG/ML IJ SOLN
INTRAMUSCULAR | Status: DC | PRN
  Administered 2018-05-26: 10 mg via INTRAVENOUS

## 2018-05-26 MED ORDER — METOCLOPRAMIDE HCL 5 MG/ML IJ SOLN: 5.0000 mg | Freq: Three times a day (TID) | INTRAMUSCULAR | Status: DC | PRN

## 2018-05-26 MED ORDER — PROPOFOL 10 MG/ML IV BOLUS
INTRAVENOUS | Status: DC | PRN
  Administered 2018-05-26: 40 mg via INTRAVENOUS

## 2018-05-26 MED ORDER — ONDANSETRON HCL 4 MG/2ML IJ SOLN: 4.0000 mg | Freq: Four times a day (QID) | INTRAMUSCULAR | Status: DC | PRN

## 2018-05-26 MED ORDER — MIDAZOLAM HCL 2 MG/2ML IJ SOLN
INTRAMUSCULAR | Status: AC
  Filled 2018-05-26: qty 2

## 2018-05-26 MED ORDER — 0.9 % SODIUM CHLORIDE (POUR BTL) OPTIME
TOPICAL | Status: DC | PRN
  Administered 2018-05-26: 1000 mL

## 2018-05-26 MED ORDER — LACTATED RINGERS IV SOLN: INTRAVENOUS | Status: DC

## 2018-05-26 MED ORDER — CEFAZOLIN SODIUM-DEXTROSE 2-4 GM/100ML-% IV SOLN
2.0000 g | Freq: Four times a day (QID) | INTRAVENOUS | Status: AC
  Administered 2018-05-26 (×2): 2 g via INTRAVENOUS
  Filled 2018-05-26 (×2): qty 100

## 2018-05-26 MED ORDER — PHENOL 1.4 % MT LIQD: 1.0000 | OROMUCOSAL | Status: DC | PRN

## 2018-05-26 MED ORDER — KETOROLAC TROMETHAMINE 15 MG/ML IJ SOLN
15.0000 mg | Freq: Four times a day (QID) | INTRAMUSCULAR | Status: AC
  Administered 2018-05-26 – 2018-05-27 (×4): 15 mg via INTRAVENOUS
  Filled 2018-05-26 (×4): qty 1

## 2018-05-26 MED ORDER — HYDROCODONE-ACETAMINOPHEN 7.5-325 MG PO TABS
1.0000 | ORAL_TABLET | ORAL | Status: DC | PRN
  Administered 2018-05-26 – 2018-05-27 (×5): 2 via ORAL
  Filled 2018-05-26 (×5): qty 2

## 2018-05-26 MED ORDER — ALUM & MAG HYDROXIDE-SIMETH 200-200-20 MG/5ML PO SUSP: 30.0000 mL | ORAL | Status: DC | PRN

## 2018-05-26 MED ORDER — PROPOFOL 500 MG/50ML IV EMUL
INTRAVENOUS | Status: DC | PRN
  Administered 2018-05-26: 100 ug/kg/min via INTRAVENOUS

## 2018-05-26 MED ORDER — BUPIVACAINE IN DEXTROSE 0.75-8.25 % IT SOLN
INTRATHECAL | Status: DC | PRN
  Administered 2018-05-26: 1.6 mL via INTRATHECAL

## 2018-05-26 MED ORDER — MIDAZOLAM HCL 5 MG/5ML IJ SOLN
INTRAMUSCULAR | Status: DC | PRN
  Administered 2018-05-26: 2 mg via INTRAVENOUS

## 2018-05-26 MED ORDER — DEXAMETHASONE SODIUM PHOSPHATE 10 MG/ML IJ SOLN
INTRAMUSCULAR | Status: AC
  Filled 2018-05-26: qty 1

## 2018-05-26 MED ORDER — TRANEXAMIC ACID-NACL 1000-0.7 MG/100ML-% IV SOLN
1000.0000 mg | Freq: Once | INTRAVENOUS | Status: AC
  Administered 2018-05-26: 1000 mg via INTRAVENOUS
  Filled 2018-05-26: qty 100

## 2018-05-26 MED ORDER — LACTATED RINGERS IV SOLN
INTRAVENOUS | Status: DC | PRN
  Administered 2018-05-26: 07:00:00 via INTRAVENOUS

## 2018-05-26 MED ORDER — METHOCARBAMOL 1000 MG/10ML IJ SOLN
500.0000 mg | Freq: Four times a day (QID) | INTRAVENOUS | Status: DC | PRN
  Filled 2018-05-26: qty 5

## 2018-05-26 MED ORDER — ACETAMINOPHEN 500 MG PO TABS
500.0000 mg | ORAL_TABLET | Freq: Four times a day (QID) | ORAL | Status: AC
  Administered 2018-05-26 – 2018-05-27 (×4): 500 mg via ORAL
  Filled 2018-05-26 (×4): qty 1

## 2018-05-26 SURGICAL SUPPLY — 63 items
BAG DECANTER FOR FLEXI CONT (MISCELLANEOUS) ×3 IMPLANT
BANDAGE ESMARK 6X9 LF (GAUZE/BANDAGES/DRESSINGS) ×1 IMPLANT
BLADE SAGITTAL 25.0X1.19X90 (BLADE) ×2 IMPLANT
BLADE SAGITTAL 25.0X1.19X90MM (BLADE) ×1
BLADE SAW SGTL 13.0X1.19X90.0M (BLADE) ×3 IMPLANT
BNDG ELASTIC 6X10 VLCR STRL LF (GAUZE/BANDAGES/DRESSINGS) ×3 IMPLANT
BNDG ESMARK 6X9 LF (GAUZE/BANDAGES/DRESSINGS) ×3
BOWL SMART MIX CTS (DISPOSABLE) ×3 IMPLANT
CATH FOLEY 2WAY 5CC 16FR (CATHETERS) ×2
CATH URTH STD 16FR FL 2W DRN (CATHETERS) ×1 IMPLANT
CEMENT HV SMART SET (Cement) ×6 IMPLANT
CLOSURE STERI-STRIP 1/2X4 (GAUZE/BANDAGES/DRESSINGS) ×1
CLOSURE WOUND 1/2 X4 (GAUZE/BANDAGES/DRESSINGS) ×1
CLSR STERI-STRIP ANTIMIC 1/2X4 (GAUZE/BANDAGES/DRESSINGS) ×2 IMPLANT
COMP FEM CEM STD+ RT LCS (Orthopedic Implant) ×3 IMPLANT
COMPONENT FEM CEM STD+ RT LCS (Orthopedic Implant) ×1 IMPLANT
COVER SURGICAL LIGHT HANDLE (MISCELLANEOUS) ×3 IMPLANT
COVER WAND RF STERILE (DRAPES) ×3 IMPLANT
CUFF TOURNIQUET SINGLE 34IN LL (TOURNIQUET CUFF) ×3 IMPLANT
CUFF TOURNIQUET SINGLE 44IN (TOURNIQUET CUFF) IMPLANT
DECANTER SPIKE VIAL GLASS SM (MISCELLANEOUS) ×3 IMPLANT
DRAPE EXTREMITY T 121X128X90 (DRAPE) ×3 IMPLANT
DRAPE HALF SHEET 40X57 (DRAPES) ×6 IMPLANT
DRAPE U-SHAPE 47X51 STRL (DRAPES) ×3 IMPLANT
DRSG AQUACEL AG ADV 3.5X10 (GAUZE/BANDAGES/DRESSINGS) ×3 IMPLANT
DURAPREP 26ML APPLICATOR (WOUND CARE) ×3 IMPLANT
ELECT REM PT RETURN 9FT ADLT (ELECTROSURGICAL) ×3
ELECTRODE REM PT RTRN 9FT ADLT (ELECTROSURGICAL) ×1 IMPLANT
GLOVE BIO SURGEON STRL SZ7 (GLOVE) ×3 IMPLANT
GLOVE BIO SURGEON STRL SZ8 (GLOVE) ×9 IMPLANT
GLOVE BIOGEL PI IND STRL 7.5 (GLOVE) ×1 IMPLANT
GLOVE BIOGEL PI IND STRL 8 (GLOVE) ×3 IMPLANT
GLOVE BIOGEL PI INDICATOR 7.5 (GLOVE) ×2
GLOVE BIOGEL PI INDICATOR 8 (GLOVE) ×6
GOWN STRL REUS W/ TWL LRG LVL3 (GOWN DISPOSABLE) ×2 IMPLANT
GOWN STRL REUS W/ TWL XL LVL3 (GOWN DISPOSABLE) ×3 IMPLANT
GOWN STRL REUS W/TWL LRG LVL3 (GOWN DISPOSABLE) ×4
GOWN STRL REUS W/TWL XL LVL3 (GOWN DISPOSABLE) ×6
HANDPIECE INTERPULSE COAX TIP (DISPOSABLE) ×2
HOOD PEEL AWAY FACE SHEILD DIS (HOOD) ×6 IMPLANT
IMMOBILIZER KNEE 22 UNIV (SOFTGOODS) ×3 IMPLANT
INSERT TIB LCS RP STD+ 12.5 (Knees) ×3 IMPLANT
KIT BASIN OR (CUSTOM PROCEDURE TRAY) ×3 IMPLANT
KIT TURNOVER KIT B (KITS) ×3 IMPLANT
MANIFOLD NEPTUNE II (INSTRUMENTS) ×3 IMPLANT
NEEDLE HYPO 21X1 ECLIPSE (NEEDLE) ×3 IMPLANT
NS IRRIG 1000ML POUR BTL (IV SOLUTION) ×3 IMPLANT
PACK TOTAL JOINT (CUSTOM PROCEDURE TRAY) ×3 IMPLANT
PAD ARMBOARD 7.5X6 YLW CONV (MISCELLANEOUS) ×6 IMPLANT
PATELLA DOME PFC 35MM (Knees) ×3 IMPLANT
SET HNDPC FAN SPRY TIP SCT (DISPOSABLE) ×1 IMPLANT
STRIP CLOSURE SKIN 1/2X4 (GAUZE/BANDAGES/DRESSINGS) ×2 IMPLANT
SUT VIC AB 0 CT1 27 (SUTURE) ×2
SUT VIC AB 0 CT1 27XBRD ANBCTR (SUTURE) ×1 IMPLANT
SUT VIC AB 2-0 CT1 27 (SUTURE) ×2
SUT VIC AB 2-0 CT1 TAPERPNT 27 (SUTURE) ×1 IMPLANT
SUT VIC AB 3-0 FS2 27 (SUTURE) ×3 IMPLANT
SUT VLOC 180 0 24IN GS25 (SUTURE) ×3 IMPLANT
SYR 50ML LL SCALE MARK (SYRINGE) ×3 IMPLANT
TOWEL OR 17X24 6PK STRL BLUE (TOWEL DISPOSABLE) ×3 IMPLANT
TOWEL OR 17X26 10 PK STRL BLUE (TOWEL DISPOSABLE) ×3 IMPLANT
TRAY CATH 16FR W/PLASTIC CATH (SET/KITS/TRAYS/PACK) IMPLANT
TRAY REVISION SZ 4 (Knees) ×3 IMPLANT

## 2018-05-26 NOTE — Progress Notes (Signed)
Pt's Ibuprofen allergy is that she can not take it by mouth due to her gastric bypass surgery but pt states its okay to receive toradol through IV

## 2018-05-26 NOTE — Op Note (Signed)
PREOP DIAGNOSIS: DJD RIGHT KNEE POSTOP DIAGNOSIS: same PROCEDURE: RIGHT TKR ANESTHESIA: Spinal and MAC ATTENDING SURGEON: Ellarose Brandi G ASSISTANT: Loni Dolly PA  INDICATIONS FOR PROCEDURE: [name] is a 47 y.o. female who has struggled for a long time with pain due to degenerative arthritis of the right knee.  The patient has failed many conservative non-operative measures and at this point has pain which limits the ability to sleep and walk.  The patient is offered total knee replacement.  Informed operative consent was obtained after discussion of possible risks of anesthesia, infection, neurovascular injury, DVT, and death.  The importance of the post-operative rehabilitation protocol to optimize result was stressed extensively with the patient.  SUMMARY OF FINDINGS AND PROCEDURE:  [name] was taken to the operative suite where under the above anesthesia a right knee replacement was performed.  There were advanced degenerative changes and the bone quality was good.  We used the DePuy LCS system and placed size standard plus femur, 4MBT revision tibia, 35 mm all polyethylene patella, and a size 12.5 mm spacer.  Loni Dolly PA-C assisted throughout and was invaluable to the completion of the case in that he helped retract and maintain exposure while I placed components.  He also helped close thereby minimizing OR time.  The patient was admitted for appropriate post-op care to include perioperative antibiotics and mechanical and pharmacologic measures for DVT prophylaxis.  DESCRIPTION OF PROCEDURE:  [name] was taken to the operative suite where the above anesthesia was applied.  The patient was positioned supine and prepped and draped in normal sterile fashion.  An appropriate time out was performed.  After the administration of kefzol pre-op antibiotic the leg was elevated and exsanguinated and a tourniquet inflated. A standard longitudinal incision was made on the anterior  knee.  Dissection was carried down to the extensor mechanism.  All appropriate anti-infective measures were used including the pre-operative antibiotic, betadine impregnated drape, and closed hooded exhaust systems for each member of the surgical team.  A medial parapatellar incision was made in the extensor mechanism and the knee cap flipped and the knee flexed.  Some residual meniscal tissues were removed along with any remaining ACL/PCL tissue.  A guide was placed on the tibia and a flat cut was made on it's superior surface.  An intramedullary guide was placed in the femur and was utilized to make anterior and posterior cuts creating an appropriate flexion gap.  A second intramedullary guide was placed in the femur to make a distal cut properly balancing the knee with an extension gap equal to the flexion gap.  The three bones sized to the above mentioned sizes and the appropriate guides were placed and utilized.  A trial reduction was done and the knee easily came to full extension and the patella tracked well on flexion.  The trial components were removed and all bones were cleaned with pulsatile lavage and then dried thoroughly.  Cement was mixed and was pressurized onto the bones followed by placement of the aforementioned components.  Excess cement was trimmed and pressure was held on the components until the cement had hardened.  The tourniquet was deflated and a small amount of bleeding was controlled with cautery and pressure.  The knee was irrigated thoroughly.  The extensor mechanism was re-approximated with V-loc suture in running fashion.  The knee was flexed and the repair was solid.  The subcutaneous tissues were re-approximated with #0 and #2-0 vicryl and the skin closed  with a subcuticular stitch and steristrips.  A sterile dressing was applied.  Intraoperative fluids, EBL, and tourniquet time can be obtained from anesthesia records.  DISPOSITION:  The patient was taken to recovery room in  stable condition and admitted for appropriate post-op care to include peri-operative antibiotic and DVT prophylaxis with mechanical and pharmacologic measures.  Dakota Stangl G 05/26/2018, 9:07 AM

## 2018-05-26 NOTE — Transfer of Care (Signed)
Immediate Anesthesia Transfer of Care Note  Patient: [name]  Procedure(s) Performed: RIGHT TOTAL KNEE ARTHROPLASTY (Right Knee)  Patient Location: PACU  Anesthesia Type:Spinal and MAC combined with regional for post-op pain  Level of Consciousness: awake, alert  and oriented  Airway & Oxygen Therapy: Patient Spontanous Breathing and Patient connected to nasal cannula oxygen  Post-op Assessment: Report given to RN and Post -op Vital signs reviewed and stable  Post vital signs: Reviewed and stable  Last Vitals:  Vitals Value Taken Time  BP    Temp    Pulse 83 05/26/2018  9:41 AM  Resp 20 05/26/2018  9:41 AM  SpO2 97 % 05/26/2018  9:41 AM  Vitals shown include unvalidated device data.  Last Pain:  Vitals:   05/26/18 0647  PainSc: 6       Patients Stated Pain Goal: 2 (21/58/72 7618)  Complications: No apparent anesthesia complications

## 2018-05-26 NOTE — Interval H&P Note (Signed)
History and Physical Interval Note:  05/26/2018 7:19 AM  [name]  has presented today for surgery, with the diagnosis of RIGHT KNEE DEGENERATIVE JOINT DISEASE  The various methods of treatment have been discussed with the patient and family. After consideration of risks, benefits and other options for treatment, the patient has consented to  Procedure(s): RIGHT TOTAL KNEE ARTHROPLASTY (Right) as a surgical intervention .  The patient's history has been reviewed, patient examined, no change in status, stable for surgery.  I have reviewed the patient's chart and labs.  Questions were answered to the patient's satisfaction.     [place] G

## 2018-05-26 NOTE — Anesthesia Procedure Notes (Signed)
Spinal  Patient location during procedure: OR Start time: 05/26/2018 7:28 AM End time: 05/26/2018 7:38 AM Staffing Anesthesiologist: Duane Boston, MD Performed: anesthesiologist  Preanesthetic Checklist Completed: patient identified, surgical consent, pre-op evaluation, timeout performed, IV checked, risks and benefits discussed and monitors and equipment checked Spinal Block Patient position: sitting Prep: DuraPrep Patient monitoring: cardiac monitor, continuous pulse ox and blood pressure Approach: midline Location: L2-3 Injection technique: single-shot Needle Needle type: Pencan  Needle gauge: 24 G Needle length: 9 cm Additional Notes Functioning IV was confirmed and monitors were applied. Sterile prep and drape, including hand hygiene and sterile gloves were used. The patient was positioned and the spine was prepped. The skin was anesthetized with lidocaine.  Free flow of clear CSF was obtained prior to injecting local anesthetic into the CSF.  The spinal needle aspirated freely following injection.  The needle was carefully withdrawn.  The patient tolerated the procedure well.

## 2018-05-26 NOTE — Anesthesia Postprocedure Evaluation (Signed)
Anesthesia Post Note  Patient: [name]  Procedure(s) Performed: RIGHT TOTAL KNEE ARTHROPLASTY (Right Knee)     Patient location during evaluation: PACU Anesthesia Type: Regional and Spinal Level of consciousness: awake and alert Pain management: pain level controlled Vital Signs Assessment: post-procedure vital signs reviewed and stable Respiratory status: spontaneous breathing and respiratory function stable Cardiovascular status: blood pressure returned to baseline and stable Postop Assessment: spinal receding Anesthetic complications: no    Last Vitals:  Vitals:   05/26/18 1025 05/26/18 1043  BP:  112/80  Pulse:  85  Resp:  18  Temp: (!) 36.3 C 36.5 C  SpO2:  99%    Last Pain:  Vitals:   05/26/18 1043  TempSrc: Oral  PainSc:                  [name] DANIEL

## 2018-05-26 NOTE — Evaluation (Signed)
Physical Therapy Evaluation Patient Details Name: [name] MRN: [id number] DOB: 12-12-1970 Today's Date: 05/26/2018   History of Present Illness  Pt is a 47 y/o female s/p R TKA. PMH includes DM, depression, and L TKA.   Clinical Impression  Pt is s/p surgery above with deficits below. Pt limited in gait tolerance secondary to pain. Required min to min guard A for mobility using RW. Educated about knee precautions and supine HEP. Will continue to follow acutely to maximize functional mobility independence and safety.     Follow Up Recommendations Follow surgeon's recommendation for DC plan and follow-up therapies;Supervision for mobility/OOB    Equipment Recommendations  None recommended by PT    Recommendations for Other Services       Precautions / Restrictions Precautions Precautions: Knee Precaution Booklet Issued: Yes (comment) Precaution Comments: Reviewed knee precautions and supine HEP.  Restrictions Weight Bearing Restrictions: Yes RLE Weight Bearing: Weight bearing as tolerated      Mobility  Bed Mobility Overal bed mobility: Needs Assistance Bed Mobility: Supine to Sit     Supine to sit: Supervision     General bed mobility comments: Supervision for safety.   Transfers Overall transfer level: Needs assistance Equipment used: Rolling walker (2 wheeled) Transfers: Sit to/from Stand Sit to Stand: Min assist         General transfer comment: Min A for lift assist and steadying. Verbal cues for safe hand placement.   Ambulation/Gait Ambulation/Gait assistance: Min guard Gait Distance (Feet): 25 Feet Assistive device: Rolling walker (2 wheeled) Gait Pattern/deviations: Step-to pattern;Decreased step length - right;Decreased step length - left;Decreased weight shift to right;Antalgic Gait velocity: Decreased    General Gait Details: Pt guarded during gait. Cues for sequencing using RW. Distance limited to within the room secondary to increased  pain.   Stairs            Wheelchair Mobility    Modified Rankin (Stroke Patients Only)       Balance Overall balance assessment: Needs assistance Sitting-balance support: No upper extremity supported;Feet supported Sitting balance-Leahy Scale: Good     Standing balance support: Bilateral upper extremity supported;During functional activity Standing balance-Leahy Scale: Poor Standing balance comment: Reliant on BUE support                              Pertinent Vitals/Pain Pain Assessment: 0-10 Pain Score: 6  Pain Location: R knee  Pain Descriptors / Indicators: Aching;Operative site guarding Pain Intervention(s): Limited activity within patient's tolerance;Monitored during session;Repositioned    Home Living Family/patient expects to be discharged to:: Private residence Living Arrangements: Spouse/significant other Available Help at Discharge: Family;Available 24 hours/day Type of Home: House Home Access: Stairs to enter Entrance Stairs-Rails: Right;Left;Can reach both Entrance Stairs-Number of Steps: 3 Home Layout: Multi-level Home Equipment: Walker - 2 wheels;Bedside commode      Prior Function Level of Independence: Independent               Hand Dominance   Dominant Hand: Right    Extremity/Trunk Assessment        Lower Extremity Assessment Lower Extremity Assessment: RLE deficits/detail RLE Deficits / Details: Reports decreased sensation. Able to perform ther ex below. Deficits consistent with post op pain and weakness.     Cervical / Trunk Assessment Cervical / Trunk Assessment: Normal  Communication   Communication: No difficulties  Cognition Arousal/Alertness: Awake/alert Behavior During Therapy: WFL for tasks assessed/performed Overall Cognitive Status:  Within Functional Limits for tasks assessed                                        General Comments      Exercises Total Joint Exercises Ankle  Circles/Pumps: AROM;Both;20 reps Quad Sets: AROM;Right;10 reps Heel Slides: AROM;Right;10 reps   Assessment/Plan    PT Assessment Patient needs continued PT services  PT Problem List Decreased strength;Decreased balance;Decreased activity tolerance;Decreased mobility;Decreased knowledge of use of DME;Decreased range of motion;Decreased knowledge of precautions;Pain       PT Treatment Interventions DME instruction;Gait training;Functional mobility training;Stair training;Therapeutic exercise;Therapeutic activities;Balance training;Patient/family education    PT Goals (Current goals can be found in the Care Plan section)  Acute Rehab PT Goals Patient Stated Goal: to go home PT Goal Formulation: With patient Time For Goal Achievement: 06/09/18 Potential to Achieve Goals: Good    Frequency 7X/week   Barriers to discharge        Co-evaluation               AM-PAC PT "6 Clicks" Daily Activity  Outcome Measure Difficulty turning over in bed (including adjusting bedclothes, sheets and blankets)?: A Little Difficulty moving from lying on back to sitting on the side of the bed? : A Little Difficulty sitting down on and standing up from a chair with arms (e.g., wheelchair, bedside commode, etc,.)?: Unable Help needed moving to and from a bed to chair (including a wheelchair)?: A Little Help needed walking in hospital room?: A Little Help needed climbing 3-5 steps with a railing? : A Lot 6 Click Score: 15    End of Session Equipment Utilized During Treatment: Gait belt Activity Tolerance: Patient limited by pain Patient left: in chair;with call bell/phone within reach Nurse Communication: Mobility status PT Visit Diagnosis: Other abnormalities of gait and mobility (R26.89);Muscle weakness (generalized) (M62.81);Pain Pain - Right/Left: Right Pain - part of body: Knee    Time: 1336-1401 PT Time Calculation (min) (ACUTE ONLY): 25 min   Charges:   PT Evaluation $PT Eval Low  Complexity: 1 Low PT Treatments $Therapeutic Activity: 8-22 mins        Leighton Ruff, PT, DPT  Acute Rehabilitation Services  Pager: [phone] Office: [phone]   Rudean Hitt 05/26/2018, 3:14 PM

## 2018-05-26 NOTE — Anesthesia Procedure Notes (Signed)
Anesthesia Regional Block: Adductor canal block   Pre-Anesthetic Checklist: ,, timeout performed, Correct Patient, Correct Site, Correct Laterality, Correct Procedure, Correct Position, site marked, Risks and benefits discussed,  Surgical consent,  Pre-op evaluation,  At surgeon's request and post-op pain management  Laterality: Right  Prep: chloraprep       Needles:  Injection technique: Single-shot  Needle Type: Stimulator Needle - 80     Needle Length: 10cm  Needle Gauge: 21     Additional Needles:   Narrative:  Start time: 05/26/2018 6:26 AM End time: 05/26/2018 7:06 AM Injection made incrementally with aspirations every 5 mL.  Performed by: Personally

## 2018-05-26 NOTE — Anesthesia Procedure Notes (Signed)
Procedure Name: MAC Date/Time: 05/26/2018 7:38 AM Performed by: Kyung Rudd, CRNA Pre-anesthesia Checklist: Patient identified, Emergency Drugs available, Suction available and Patient being monitored Patient Re-evaluated:Patient Re-evaluated prior to induction Oxygen Delivery Method: Simple face mask Induction Type: IV induction Placement Confirmation: positive ETCO2

## 2018-05-27 ENCOUNTER — Encounter (HOSPITAL_COMMUNITY): Payer: Self-pay | Admitting: Orthopaedic Surgery

## 2018-05-27 DIAGNOSIS — Z86718 Personal history of other venous thrombosis and embolism: Secondary | ICD-10-CM | POA: Diagnosis not present

## 2018-05-27 DIAGNOSIS — Z9884 Bariatric surgery status: Secondary | ICD-10-CM | POA: Diagnosis not present

## 2018-05-27 DIAGNOSIS — Z8614 Personal history of Methicillin resistant Staphylococcus aureus infection: Secondary | ICD-10-CM | POA: Diagnosis not present

## 2018-05-27 DIAGNOSIS — M1711 Unilateral primary osteoarthritis, right knee: Secondary | ICD-10-CM | POA: Diagnosis not present

## 2018-05-27 DIAGNOSIS — Z882 Allergy status to sulfonamides status: Secondary | ICD-10-CM | POA: Diagnosis not present

## 2018-05-27 DIAGNOSIS — Z6838 Body mass index (BMI) 38.0-38.9, adult: Secondary | ICD-10-CM | POA: Diagnosis not present

## 2018-05-27 DIAGNOSIS — F419 Anxiety disorder, unspecified: Secondary | ICD-10-CM | POA: Diagnosis not present

## 2018-05-27 DIAGNOSIS — E669 Obesity, unspecified: Secondary | ICD-10-CM | POA: Diagnosis not present

## 2018-05-27 DIAGNOSIS — F329 Major depressive disorder, single episode, unspecified: Secondary | ICD-10-CM | POA: Diagnosis not present

## 2018-05-27 DIAGNOSIS — Z23 Encounter for immunization: Secondary | ICD-10-CM | POA: Diagnosis not present

## 2018-05-27 MED ORDER — HYDROCODONE-ACETAMINOPHEN 5-325 MG PO TABS: 1.0000 | ORAL_TABLET | Freq: Four times a day (QID) | ORAL | 0 refills | Status: DC | PRN

## 2018-05-27 MED ORDER — ASPIRIN 325 MG PO TBEC: 325.0000 mg | DELAYED_RELEASE_TABLET | Freq: Two times a day (BID) | ORAL | 0 refills | Status: DC

## 2018-05-27 MED ORDER — TIZANIDINE HCL 4 MG PO TABS: 4.0000 mg | ORAL_TABLET | Freq: Four times a day (QID) | ORAL | 1 refills | Status: DC | PRN

## 2018-05-27 NOTE — Care Management Note (Signed)
Case Management Note  Patient Details  Name: [name] MRN: [id number] Date of Birth: 1971-02-28  Subjective/Objective:  47 yr old female s/p right total knee arthroplasty.                  Action/Plan: Case manager spoke with patient concerning discharge plan and DME. Patient was preoperatively setup with [facility] (formerly [facility]), no changes. She has DME. Patient will have support of family at discharge.    Expected Discharge Date:  05/27/18               Expected Discharge Plan:  [place]  In-House Referral:  NA  Discharge planning Services  CM Consult  Post Acute Care Choice:  Home Health Choice offered to:  Patient  DME Arranged:  N/A(Has RW and 3in1 from previous surgery) DME Agency:  NA  HH Arranged:  PT HH Agency:  [facility] is now [place])  Status of Service:  Completed, signed off  If discussed at [place] of Stay Meetings, dates discussed:    Additional Comments:  Ninfa Meeker, RN 05/27/2018, 12:55 PM

## 2018-05-27 NOTE — Plan of Care (Signed)
  Problem: Education: Goal: Knowledge of General Education information will improve Description Including pain rating scale, medication(s)/side effects and non-pharmacologic comfort measures Outcome: Progressing Note:  POC and pain management reviewed with pt.   

## 2018-05-27 NOTE — Progress Notes (Signed)
Physical Therapy Treatment Patient Details Name: [name] MRN: [id number] DOB: [birth year] Today's Date: 05/27/2018    History of Present Illness Pt is a 47 y/o female s/p R TKA. PMH includes DM, depression, and L TKA.     PT Comments    Patient is making good progress with PT.  From a mobility standpoint anticipate patient will be ready for DC home when medically ready.   Follow Up Recommendations  Follow surgeon's recommendation for DC plan and follow-up therapies;Supervision for mobility/OOB     Equipment Recommendations  None recommended by PT    Recommendations for Other Services       Precautions / Restrictions Precautions Precautions: Knee Precaution Comments: Reviewed knee precautions and positioning with pt  Restrictions Weight Bearing Restrictions: Yes RLE Weight Bearing: Weight bearing as tolerated    Mobility  Bed Mobility Overal bed mobility: Modified Independent Bed Mobility: Supine to Sit;Sit to Supine           General bed mobility comments: increased time and effort  Transfers Overall transfer level: Needs assistance Equipment used: Rolling walker (2 wheeled) Transfers: Sit to/from Stand Sit to Stand: Min guard         General transfer comment: for safety; safe hand placement demonstrated  Ambulation/Gait Ambulation/Gait assistance: Supervision Gait Distance (Feet): 250 Feet Assistive device: Rolling walker (2 wheeled) Gait Pattern/deviations: Step-through pattern;Decreased stride length Gait velocity: Decreased    General Gait Details: cues for cadence and stride length; pt with improved step length symmetry and less reliant on RW this session   Stairs             Wheelchair Mobility    Modified Rankin (Stroke Patients Only)       Balance Overall balance assessment: Needs assistance Sitting-balance support: No upper extremity supported;Feet supported Sitting balance-Leahy Scale: Good     Standing balance  support: Bilateral upper extremity supported;During functional activity Standing balance-Leahy Scale: Poor Standing balance comment: pt able to static stand without UE support                            Cognition Arousal/Alertness: Awake/alert Behavior During Therapy: WFL for tasks assessed/performed Overall Cognitive Status: Within Functional Limits for tasks assessed                                        Exercises Total Joint Exercises Ankle Circles/Pumps: AROM;Both;20 reps Quad Sets: AROM;Both;10 reps Short Arc Quad: AROM;Right;10 reps Heel Slides: AROM;Right;10 reps Hip ABduction/ADduction: AROM;Right;10 reps Straight Leg Raises: AROM;Right;10 reps Long Arc Quad: AROM;Right;10 reps Knee Flexion: AROM;Right;10 reps;Seated(10 second holds) Goniometric ROM: approx 0-90    General Comments        Pertinent Vitals/Pain Pain Assessment: Faces Faces Pain Scale: (4-6) Pain Location: R knee  Pain Descriptors / Indicators: Guarding;Sore Pain Intervention(s): Limited activity within patient's tolerance;Monitored during session;Premedicated before session;Repositioned;Ice applied    Home Living                      Prior Function            PT Goals (current goals can now be found in the care plan section) Acute Rehab PT Goals Patient Stated Goal: to go home Progress towards PT goals: Progressing toward goals    Frequency    7X/week      PT Plan  Current plan remains appropriate    Co-evaluation              AM-PAC PT "6 Clicks" Daily Activity  Outcome Measure  Difficulty turning over in bed (including adjusting bedclothes, sheets and blankets)?: A Little Difficulty moving from lying on back to sitting on the side of the bed? : A Little Difficulty sitting down on and standing up from a chair with arms (e.g., wheelchair, bedside commode, etc,.)?: Unable Help needed moving to and from a bed to chair (including a  wheelchair)?: A Little Help needed walking in hospital room?: A Little Help needed climbing 3-5 steps with a railing? : A Little 6 Click Score: 16    End of Session Equipment Utilized During Treatment: Gait belt Activity Tolerance: Patient tolerated treatment well Patient left: with call bell/phone within reach;in bed Nurse Communication: Mobility status PT Visit Diagnosis: Other abnormalities of gait and mobility (R26.89);Muscle weakness (generalized) (M62.81);Pain Pain - Right/Left: Right Pain - part of body: Knee     Time: 0158-6825 PT Time Calculation (min) (ACUTE ONLY): 32 min  Charges:  $Gait Training: 8-22 mins $Therapeutic Exercise: 8-22 mins                     Earney Navy, PTA Acute Rehabilitation Services Pager: [phone] Office: [phone]     Darliss Cheney 05/27/2018, 2:47 PM

## 2018-05-27 NOTE — Progress Notes (Signed)
Physical Therapy Treatment Patient Details Name: [name] MRN: [id number] DOB: [birth year] Today's Date: 05/27/2018    History of Present Illness Pt is a 46 y/o female s/p R TKA. PMH includes DM, depression, and L TKA.     PT Comments    Patient seen for mobility progression. Pt overall requires supervision/min guard assist for OOB mobility and tolerated gait/stair training well with no c/o increased pain.  Continue to progress as tolerated. Current plan remains appropriate.   Follow Up Recommendations  Follow surgeon's recommendation for DC plan and follow-up therapies;Supervision for mobility/OOB     Equipment Recommendations  None recommended by PT    Recommendations for Other Services       Precautions / Restrictions Precautions Precautions: Knee Precaution Comments: Reviewed knee precautions and positioning with pt  Restrictions Weight Bearing Restrictions: Yes RLE Weight Bearing: Weight bearing as tolerated    Mobility  Bed Mobility Overal bed mobility: Modified Independent Bed Mobility: Supine to Sit           General bed mobility comments: increased time and effort  Transfers Overall transfer level: Needs assistance Equipment used: Rolling walker (2 wheeled) Transfers: Sit to/from Stand Sit to Stand: Min guard         General transfer comment: for safety; safe hand placement demonstrated  Ambulation/Gait Ambulation/Gait assistance: Supervision;Min guard Gait Distance (Feet): 160 Feet Assistive device: Rolling walker (2 wheeled) Gait Pattern/deviations: Decreased weight shift to right;Step-through pattern;Decreased stride length;Antalgic Gait velocity: Decreased    General Gait Details: cues for R quad activation during stance phase; mildly antalgic gait with improving step length symmetry    Stairs Stairs: Yes Stairs assistance: Min guard Stair Management: One rail Right;Step to pattern;Sideways Number of Stairs: 6 General stair  comments: cues for sequencing and technique   Wheelchair Mobility    Modified Rankin (Stroke Patients Only)       Balance Overall balance assessment: Needs assistance Sitting-balance support: No upper extremity supported;Feet supported Sitting balance-Leahy Scale: Good     Standing balance support: Bilateral upper extremity supported;During functional activity Standing balance-Leahy Scale: Poor                              Cognition Arousal/Alertness: Awake/alert Behavior During Therapy: WFL for tasks assessed/performed Overall Cognitive Status: Within Functional Limits for tasks assessed                                        Exercises      General Comments        Pertinent Vitals/Pain Pain Assessment: Faces Faces Pain Scale: Hurts little more Pain Location: R knee  Pain Descriptors / Indicators: Guarding;Sore Pain Intervention(s): Monitored during session;Repositioned;RN gave pain meds during session;Ice applied    Home Living                      Prior Function            PT Goals (current goals can now be found in the care plan section) Acute Rehab PT Goals Patient Stated Goal: to go home Progress towards PT goals: Progressing toward goals    Frequency    7X/week      PT Plan Current plan remains appropriate    Co-evaluation              AM-PAC PT "  6 Clicks" Daily Activity  Outcome Measure  Difficulty turning over in bed (including adjusting bedclothes, sheets and blankets)?: A Little Difficulty moving from lying on back to sitting on the side of the bed? : A Little Difficulty sitting down on and standing up from a chair with arms (e.g., wheelchair, bedside commode, etc,.)?: Unable Help needed moving to and from a bed to chair (including a wheelchair)?: A Little Help needed walking in hospital room?: A Little Help needed climbing 3-5 steps with a railing? : A Little 6 Click Score: 16    End of  Session Equipment Utilized During Treatment: Gait belt Activity Tolerance: Patient tolerated treatment well Patient left: in chair;with call bell/phone within reach Nurse Communication: Mobility status PT Visit Diagnosis: Other abnormalities of gait and mobility (R26.89);Muscle weakness (generalized) (M62.81);Pain Pain - Right/Left: Right Pain - part of body: Knee     Time: 0630-1601 PT Time Calculation (min) (ACUTE ONLY): 30 min  Charges:  $Gait Training: 8-22 mins $Therapeutic Activity: 8-22 mins                     Earney Navy, PTA Acute Rehabilitation Services Pager: [phone] Office: [phone]     Darliss Cheney 05/27/2018, 9:23 AM

## 2018-05-27 NOTE — Progress Notes (Signed)
Subjective: 1 Day Post-Op Procedure(s) (LRB): RIGHT TOTAL KNEE ARTHROPLASTY (Right)  Activity level:  wbat Diet tolerance:  ok Voiding:  ok Patient reports pain as mild.    Objective: Vital signs in last 24 hours: Temp:  [97 F (36.1 C)-99 F (37.2 C)] 98.5 F (36.9 C) (10/16 0043) Pulse Rate:  [74-85] 74 (10/16 0043) Resp:  [15-18] 16 (10/16 0043) BP: (112-139)/(61-80) 134/69 (10/16 0043) SpO2:  [95 %-99 %] 98 % (10/16 0043)  Labs: No results for input(s): HGB in the last 72 hours. No results for input(s): WBC, RBC, HCT, PLT in the last 72 hours. No results for input(s): NA, K, CL, CO2, BUN, CREATININE, GLUCOSE, CALCIUM in the last 72 hours. No results for input(s): LABPT, INR in the last 72 hours.  Physical Exam:  Neurologically intact ABD soft Neurovascular intact Sensation intact distally Intact pulses distally Dorsiflexion/Plantar flexion intact Incision: dressing C/D/I and no drainage No cellulitis present Compartment soft  Assessment/Plan:  1 Day Post-Op Procedure(s) (LRB): RIGHT TOTAL KNEE ARTHROPLASTY (Right) Advance diet Up with therapy Discharge home with home health today after PT. Continue on ASA 325mg  BID x 2 weeks post op. Follow up in office 2 weeks post op.  Patient's anticipated LOS is less than 2 midnights, meeting these requirements: - Younger than 48 - Lives within 1 hour of care - Has a competent adult at home to recover with post-op recover - NO history of  - Chronic pain requiring opiods  - Diabetes  - Coronary Artery Disease  - Heart failure  - Heart attack  - Stroke  - DVT/VTE  - Cardiac arrhythmia  - Respiratory Failure/COPD  - Renal failure  - Anemia  - Advanced Liver disease  [name] PAUL 05/27/2018, 8:00 AM

## 2018-05-27 NOTE — Discharge Summary (Signed)
Patient ID: [name] MRN: [id number] DOB/AGE: 08/12/1971 47 y.o.  Admit date: 05/26/2018 Discharge date: 05/27/2018  Admission Diagnoses:  Principal Problem:   Primary osteoarthritis of right knee   Discharge Diagnoses:  Same  Past Medical History:  Diagnosis Date  . Anxiety and depression   . Arthritis   . Complication of anesthesia    has been hard to wake up post-op  . Dental crowns present   . Depression   . Diabetes ([place])    resolved with gastric bypass, no meds  . DVT of lower extremity (deep venous thrombosis) ([place]) 2012   LLE  . Dysmenorrhea    hx endometriosis and fibroid  . Family history of breast cancer   . Headache(784.0)    tension or sinus  . History of MRSA infection prior to 2012   lasted 5-6 yr.  . Hx of nipple discharge    Bilateral.  Long standing.  Negative work up.   . Jaw snapping    states jaw pops  . Left foot drop 2016   --[place] [facility]  . Neuromuscular disorder ([place])    neuropathy in hands,feet,face  . Neuropathy 2015   hands, feet and face--[place] [facility]  . Prothrombin gene mutation ([place]) 2018   single gene mutation  . Right knee pain    hx meniscal injury, chondromalacia, OA  . Thyroid nodule     Surgeries: Procedure(s): RIGHT TOTAL KNEE ARTHROPLASTY on 05/26/2018   Consultants:   Discharged Condition: Improved  Hospital Course: [name] is an 47 y.o. female who was admitted 05/26/2018 for operative treatment ofPrimary osteoarthritis of right knee. Patient has severe unremitting pain that affects sleep, daily activities, and work/hobbies. After pre-op clearance the patient was taken to the operating room on 05/26/2018 and underwent  Procedure(s): RIGHT TOTAL KNEE ARTHROPLASTY.    Patient was given perioperative antibiotics:  Anti-infectives (From admission, onward)   Start     Dose/Rate Route Frequency Ordered Stop   05/26/18 1330  ceFAZolin (ANCEF) IVPB 2g/100 mL premix     2 g 200 mL/hr over 30  Minutes Intravenous Every 6 hours 05/26/18 1108 05/26/18 1838   05/26/18 0630  ceFAZolin (ANCEF) IVPB 2g/100 mL premix     2 g 200 mL/hr over 30 Minutes Intravenous To ShortStay Surgical 05/25/18 1335 05/26/18 0745       Patient was given sequential compression devices, early ambulation, and chemoprophylaxis to prevent DVT.  Patient benefited maximally from hospital stay and there were no complications.    Recent vital signs:  Patient Vitals for the past 24 hrs:  BP Temp Temp src Pulse Resp SpO2  05/27/18 0043 134/69 98.5 F (36.9 C) Oral 74 16 98 %  05/26/18 2107 139/76 99 F (37.2 C) Oral 75 18 97 %  05/26/18 1400 - 98.3 F (36.8 C) Oral - - -  05/26/18 1043 112/80 97.7 F (36.5 C) Oral 85 18 99 %  05/26/18 1025 - (!) 97.3 F (36.3 C) - - - -  05/26/18 1015 128/63 - - 83 15 95 %  05/26/18 1000 131/73 - - 78 16 96 %  05/26/18 0945 123/61 (!) 97 F (36.1 C) - 74 18 97 %     Recent laboratory studies: No results for input(s): WBC, HGB, HCT, PLT, NA, K, CL, CO2, BUN, CREATININE, GLUCOSE, INR, CALCIUM in the last 72 hours.  Invalid input(s): PT, 2   Discharge Medications:   Allergies as of 05/27/2018      Reactions  Bactrim [sulfamethoxazole-trimethoprim] Other (See Comments)   HALLUCINATIONS, FEVER, CHILLS   Ibuprofen Other (See Comments)   Had gastric bypass. Told not to take NSAIDs.   Lamotrigine Hives   Sulfa Antibiotics Other (See Comments)   HALLUCINATIONS, FEVER, CHILLS      Medication List    TAKE these medications   ACETAMINOPHEN EXTRA STRENGTH 167 MG/5ML Liqd Generic drug:  Acetaminophen Take 500 mg by mouth 2 (two) times daily as needed (for headache or pain).   aspirin 325 MG EC tablet Take 1 tablet (325 mg total) by mouth 2 (two) times daily after a meal.   DULoxetine 60 MG capsule Commonly known as:  CYMBALTA Take 120 mg by mouth every morning.   HYDROcodone-acetaminophen 5-325 MG tablet Commonly known as:  NORCO/VICODIN Take 1-2 tablets by  mouth every 6 (six) hours as needed for moderate pain (pain score 4-6).   Melatonin 1 MG Subl Place 3 mg under the tongue at bedtime.   multivitamin tablet Take 2 tablets by mouth 2 (two) times daily.   tiZANidine 4 MG tablet Commonly known as:  ZANAFLEX Take 1 tablet (4 mg total) by mouth every 6 (six) hours as needed.   Vitamin D (Ergocalciferol) 50000 units Caps capsule Commonly known as:  DRISDOL Take 1 capsule (50,000 Units total) by mouth every 7 (seven) days.            Durable Medical Equipment  (From admission, onward)         Start     Ordered   05/26/18 1108  DME Walker rolling  Once    Question:  Patient needs a walker to treat with the following condition  Answer:  Primary osteoarthritis of right knee   05/26/18 1108   05/26/18 1108  DME 3 n 1  Once     05/26/18 1108   05/26/18 1108  DME Bedside commode  Once    Question:  Patient needs a bedside commode to treat with the following condition  Answer:  Primary osteoarthritis of right knee   05/26/18 1108          Diagnostic Studies: No results found.  Disposition: Discharge disposition: 01-Home or Self Care       Discharge Instructions    Call MD / Call 911   Complete by:  As directed    If you experience chest pain or shortness of breath, CALL 911 and be transported to the hospital emergency room.  If you develope a fever above 101 F, pus (white drainage) or increased drainage or redness at the wound, or calf pain, call your surgeon's office.   Constipation Prevention   Complete by:  As directed    Drink plenty of fluids.  Prune juice may be helpful.  You may use a stool softener, such as Colace (over the counter) 100 mg twice a day.  Use MiraLax (over the counter) for constipation as needed.   Diet - low sodium heart healthy   Complete by:  As directed    Discharge instructions   Complete by:  As directed    INSTRUCTIONS AFTER JOINT REPLACEMENT   Remove items at home which could result in a  fall. This includes throw rugs or furniture in walking pathways ICE to the affected joint every three hours while awake for 30 minutes at a time, for at least the first 3-5 days, and then as needed for pain and swelling.  Continue to use ice for pain and swelling. You may notice swelling that  will progress down to the foot and ankle.  This is normal after surgery.  Elevate your leg when you are not up walking on it.   Continue to use the breathing machine you got in the hospital (incentive spirometer) which will help keep your temperature down.  It is common for your temperature to cycle up and down following surgery, especially at night when you are not up moving around and exerting yourself.  The breathing machine keeps your lungs expanded and your temperature down.   DIET:  As you were doing prior to hospitalization, we recommend a well-balanced diet.  DRESSING / WOUND CARE / SHOWERING  You may shower 3 days after surgery, but keep the wounds dry during showering.  You may use an occlusive plastic wrap (Press'n Seal for example), NO SOAKING/SUBMERGING IN THE BATHTUB.  If the bandage gets wet, change with a clean dry gauze.  If the incision gets wet, pat the wound dry with a clean towel.  ACTIVITY  Increase activity slowly as tolerated, but follow the weight bearing instructions below.   No driving for 6 weeks or until further direction given by your physician.  You cannot drive while taking narcotics.  No lifting or carrying greater than 10 lbs. until further directed by your surgeon. Avoid periods of inactivity such as sitting longer than an hour when not asleep. This helps prevent blood clots.  You may return to work once you are authorized by your doctor.     WEIGHT BEARING   Weight bearing as tolerated with assist device (walker, cane, etc) as directed, use it as long as suggested by your surgeon or therapist, typically at least 4-6 weeks.   EXERCISES  Results after joint  replacement surgery are often greatly improved when you follow the exercise, range of motion and muscle strengthening exercises prescribed by your doctor. Safety measures are also important to protect the joint from further injury. Any time any of these exercises cause you to have increased pain or swelling, decrease what you are doing until you are comfortable again and then slowly increase them. If you have problems or questions, call your caregiver or physical therapist for advice.   Rehabilitation is important following a joint replacement. After just a few days of immobilization, the muscles of the leg can become weakened and shrink (atrophy).  These exercises are designed to build up the tone and strength of the thigh and leg muscles and to improve motion. Often times heat used for twenty to thirty minutes before working out will loosen up your tissues and help with improving the range of motion but do not use heat for the first two weeks following surgery (sometimes heat can increase post-operative swelling).   These exercises can be done on a training (exercise) mat, on the floor, on a table or on a bed. Use whatever works the best and is most comfortable for you.    Use music or television while you are exercising so that the exercises are a pleasant break in your day. This will make your life better with the exercises acting as a break in your routine that you can look forward to.   Perform all exercises about fifteen times, three times per day or as directed.  You should exercise both the operative leg and the other leg as well.   Exercises include:   Quad Sets - Tighten up the muscle on the front of the thigh (Quad) and hold for 5-10 seconds.   Straight Leg  Raises - With your knee straight (if you were given a brace, keep it on), lift the leg to 60 degrees, hold for 3 seconds, and slowly lower the leg.  Perform this exercise against resistance later as your leg gets stronger.  Leg Slides: Lying  on your back, slowly slide your foot toward your buttocks, bending your knee up off the floor (only go as far as is comfortable). Then slowly slide your foot back down until your leg is flat on the floor again.  Angel Wings: Lying on your back spread your legs to the side as far apart as you can without causing discomfort.  Hamstring Strength:  Lying on your back, push your heel against the floor with your leg straight by tightening up the muscles of your buttocks.  Repeat, but this time bend your knee to a comfortable angle, and push your heel against the floor.  You may put a pillow under the heel to make it more comfortable if necessary.   A rehabilitation program following joint replacement surgery can speed recovery and prevent re-injury in the future due to weakened muscles. Contact your doctor or a physical therapist for more information on knee rehabilitation.    CONSTIPATION  Constipation is defined medically as fewer than three stools per week and severe constipation as less than one stool per week.  Even if you have a regular bowel pattern at home, your normal regimen is likely to be disrupted due to multiple reasons following surgery.  Combination of anesthesia, postoperative narcotics, change in appetite and fluid intake all can affect your bowels.   YOU MUST use at least one of the following options; they are listed in order of increasing strength to get the job done.  They are all available over the counter, and you may need to use some, POSSIBLY even all of these options:    Drink plenty of fluids (prune juice may be helpful) and high fiber foods Colace 100 mg by mouth twice a day  Senokot for constipation as directed and as needed Dulcolax (bisacodyl), take with full glass of water  Miralax (polyethylene glycol) once or twice a day as needed.  If you have tried all these things and are unable to have a bowel movement in the first 3-4 days after surgery call either your surgeon or  your primary doctor.    If you experience loose stools or diarrhea, hold the medications until you stool forms back up.  If your symptoms do not get better within 1 week or if they get worse, check with your doctor.  If you experience "the worst abdominal pain ever" or develop nausea or vomiting, please contact the office immediately for further recommendations for treatment.   ITCHING:  If you experience itching with your medications, try taking only a single pain pill, or even half a pain pill at a time.  You can also use Benadryl over the counter for itching or also to help with sleep.   TED HOSE STOCKINGS:  Use stockings on both legs until for at least 2 weeks or as directed by physician office. They may be removed at night for sleeping.  MEDICATIONS:  See your medication summary on the "After Visit Summary" that nursing will review with you.  You may have some home medications which will be placed on hold until you complete the course of blood thinner medication.  It is important for you to complete the blood thinner medication as prescribed.  PRECAUTIONS:  If you  experience chest pain or shortness of breath - call 911 immediately for transfer to the hospital emergency department.   If you develop a fever greater that 101 F, purulent drainage from wound, increased redness or drainage from wound, foul odor from the wound/dressing, or calf pain - CONTACT YOUR SURGEON.                                                   FOLLOW-UP APPOINTMENTS:  If you do not already have a post-op appointment, please call the office for an appointment to be seen by your surgeon.  Guidelines for how soon to be seen are listed in your "After Visit Summary", but are typically between 1-4 weeks after surgery.  OTHER INSTRUCTIONS:   Knee Replacement:  Do not place pillow under knee, focus on keeping the knee straight while resting. CPM instructions: 0-90 degrees, 2 hours in the morning, 2 hours in the afternoon, and 2  hours in the evening. Place foam block, curve side up under heel at all times except when in CPM or when walking.  DO NOT modify, tear, cut, or change the foam block in any way.  MAKE SURE YOU:  Understand these instructions.  Get help right away if you are not doing well or get worse.    Thank you for letting us be a part of your medical care team.  It is a privilege we respect greatly.  We hope these instructions will help you stay on track for a fast and full recovery!   Increase activity slowly as tolerated   Complete by:  As directed       Follow-up Information    Melrose Nakayama, MD. Schedule an appointment as soon as possible for a visit in 2 weeks.   Specialty:  Orthopedic Surgery Contact information: [place] [address] [phone]            Signed: Rich Fuchs 05/27/2018, 8:03 AM

## 2018-05-28 DIAGNOSIS — Z471 Aftercare following joint replacement surgery: Secondary | ICD-10-CM | POA: Diagnosis not present

## 2018-05-28 DIAGNOSIS — Z96653 Presence of artificial knee joint, bilateral: Secondary | ICD-10-CM | POA: Diagnosis not present

## 2018-05-28 DIAGNOSIS — M21372 Foot drop, left foot: Secondary | ICD-10-CM | POA: Diagnosis not present

## 2018-05-28 DIAGNOSIS — G629 Polyneuropathy, unspecified: Secondary | ICD-10-CM | POA: Diagnosis not present

## 2018-05-28 DIAGNOSIS — G5732 Lesion of lateral popliteal nerve, left lower limb: Secondary | ICD-10-CM | POA: Diagnosis not present

## 2018-05-28 DIAGNOSIS — Z9884 Bariatric surgery status: Secondary | ICD-10-CM | POA: Diagnosis not present

## 2018-05-28 DIAGNOSIS — D509 Iron deficiency anemia, unspecified: Secondary | ICD-10-CM | POA: Diagnosis not present

## 2018-05-29 DIAGNOSIS — D509 Iron deficiency anemia, unspecified: Secondary | ICD-10-CM | POA: Diagnosis not present

## 2018-05-29 DIAGNOSIS — G629 Polyneuropathy, unspecified: Secondary | ICD-10-CM | POA: Diagnosis not present

## 2018-05-29 DIAGNOSIS — M21372 Foot drop, left foot: Secondary | ICD-10-CM | POA: Diagnosis not present

## 2018-05-29 DIAGNOSIS — Z9884 Bariatric surgery status: Secondary | ICD-10-CM | POA: Diagnosis not present

## 2018-05-29 DIAGNOSIS — Z471 Aftercare following joint replacement surgery: Secondary | ICD-10-CM | POA: Diagnosis not present

## 2018-05-29 DIAGNOSIS — G5732 Lesion of lateral popliteal nerve, left lower limb: Secondary | ICD-10-CM | POA: Diagnosis not present

## 2018-05-29 DIAGNOSIS — Z96653 Presence of artificial knee joint, bilateral: Secondary | ICD-10-CM | POA: Diagnosis not present

## 2018-06-01 DIAGNOSIS — M21372 Foot drop, left foot: Secondary | ICD-10-CM | POA: Diagnosis not present

## 2018-06-01 DIAGNOSIS — D509 Iron deficiency anemia, unspecified: Secondary | ICD-10-CM | POA: Diagnosis not present

## 2018-06-01 DIAGNOSIS — G5732 Lesion of lateral popliteal nerve, left lower limb: Secondary | ICD-10-CM | POA: Diagnosis not present

## 2018-06-01 DIAGNOSIS — Z471 Aftercare following joint replacement surgery: Secondary | ICD-10-CM | POA: Diagnosis not present

## 2018-06-01 DIAGNOSIS — Z9884 Bariatric surgery status: Secondary | ICD-10-CM | POA: Diagnosis not present

## 2018-06-01 DIAGNOSIS — G629 Polyneuropathy, unspecified: Secondary | ICD-10-CM | POA: Diagnosis not present

## 2018-06-01 DIAGNOSIS — Z96653 Presence of artificial knee joint, bilateral: Secondary | ICD-10-CM | POA: Diagnosis not present

## 2018-06-05 DIAGNOSIS — G629 Polyneuropathy, unspecified: Secondary | ICD-10-CM | POA: Diagnosis not present

## 2018-06-05 DIAGNOSIS — M21372 Foot drop, left foot: Secondary | ICD-10-CM | POA: Diagnosis not present

## 2018-06-05 DIAGNOSIS — Z96653 Presence of artificial knee joint, bilateral: Secondary | ICD-10-CM | POA: Diagnosis not present

## 2018-06-05 DIAGNOSIS — Z9884 Bariatric surgery status: Secondary | ICD-10-CM | POA: Diagnosis not present

## 2018-06-05 DIAGNOSIS — D509 Iron deficiency anemia, unspecified: Secondary | ICD-10-CM | POA: Diagnosis not present

## 2018-06-05 DIAGNOSIS — G5732 Lesion of lateral popliteal nerve, left lower limb: Secondary | ICD-10-CM | POA: Diagnosis not present

## 2018-06-05 DIAGNOSIS — M1711 Unilateral primary osteoarthritis, right knee: Secondary | ICD-10-CM | POA: Diagnosis not present

## 2018-06-05 DIAGNOSIS — Z471 Aftercare following joint replacement surgery: Secondary | ICD-10-CM | POA: Diagnosis not present

## 2018-06-08 ENCOUNTER — Other Ambulatory Visit: Payer: Self-pay | Admitting: Family Medicine

## 2018-06-08 ENCOUNTER — Ambulatory Visit
Admission: RE | Admit: 2018-06-08 | Discharge: 2018-06-08 | Disposition: A | Discharge status: Home / Self Care | Payer: BLUE CROSS/BLUE SHIELD | Source: Ambulatory Visit | Attending: Family Medicine | Admitting: Family Medicine

## 2018-06-08 DIAGNOSIS — R600 Localized edema: Secondary | ICD-10-CM | POA: Diagnosis not present

## 2018-06-08 DIAGNOSIS — Z471 Aftercare following joint replacement surgery: Secondary | ICD-10-CM | POA: Diagnosis not present

## 2018-06-08 DIAGNOSIS — M25661 Stiffness of right knee, not elsewhere classified: Secondary | ICD-10-CM | POA: Diagnosis not present

## 2018-06-08 DIAGNOSIS — M79661 Pain in right lower leg: Secondary | ICD-10-CM

## 2018-06-08 DIAGNOSIS — Z96653 Presence of artificial knee joint, bilateral: Secondary | ICD-10-CM | POA: Diagnosis not present

## 2018-06-08 DIAGNOSIS — Z96651 Presence of right artificial knee joint: Secondary | ICD-10-CM | POA: Diagnosis not present

## 2018-06-15 DIAGNOSIS — Z96651 Presence of right artificial knee joint: Secondary | ICD-10-CM | POA: Diagnosis not present

## 2018-06-15 DIAGNOSIS — M25661 Stiffness of right knee, not elsewhere classified: Secondary | ICD-10-CM | POA: Diagnosis not present

## 2018-06-17 DIAGNOSIS — M25661 Stiffness of right knee, not elsewhere classified: Secondary | ICD-10-CM | POA: Diagnosis not present

## 2018-06-17 DIAGNOSIS — Z96651 Presence of right artificial knee joint: Secondary | ICD-10-CM | POA: Diagnosis not present

## 2018-06-19 DIAGNOSIS — M25661 Stiffness of right knee, not elsewhere classified: Secondary | ICD-10-CM | POA: Diagnosis not present

## 2018-06-19 DIAGNOSIS — Z96651 Presence of right artificial knee joint: Secondary | ICD-10-CM | POA: Diagnosis not present

## 2018-06-19 DIAGNOSIS — M7061 Trochanteric bursitis, right hip: Secondary | ICD-10-CM | POA: Diagnosis not present

## 2018-06-22 DIAGNOSIS — Z96651 Presence of right artificial knee joint: Secondary | ICD-10-CM | POA: Diagnosis not present

## 2018-06-22 DIAGNOSIS — M25661 Stiffness of right knee, not elsewhere classified: Secondary | ICD-10-CM | POA: Diagnosis not present

## 2018-06-24 DIAGNOSIS — M25661 Stiffness of right knee, not elsewhere classified: Secondary | ICD-10-CM | POA: Diagnosis not present

## 2018-06-24 DIAGNOSIS — Z96651 Presence of right artificial knee joint: Secondary | ICD-10-CM | POA: Diagnosis not present

## 2018-06-26 DIAGNOSIS — M25661 Stiffness of right knee, not elsewhere classified: Secondary | ICD-10-CM | POA: Diagnosis not present

## 2018-06-26 DIAGNOSIS — Z96651 Presence of right artificial knee joint: Secondary | ICD-10-CM | POA: Diagnosis not present

## 2018-07-02 DIAGNOSIS — M25661 Stiffness of right knee, not elsewhere classified: Secondary | ICD-10-CM | POA: Diagnosis not present

## 2018-07-02 DIAGNOSIS — Z96651 Presence of right artificial knee joint: Secondary | ICD-10-CM | POA: Diagnosis not present

## 2018-07-17 DIAGNOSIS — M7061 Trochanteric bursitis, right hip: Secondary | ICD-10-CM | POA: Diagnosis not present

## 2018-07-23 ENCOUNTER — Encounter

## 2018-08-13 DIAGNOSIS — F3342 Major depressive disorder, recurrent, in full remission: Secondary | ICD-10-CM | POA: Diagnosis not present

## 2018-08-17 DIAGNOSIS — Z471 Aftercare following joint replacement surgery: Secondary | ICD-10-CM | POA: Diagnosis not present

## 2018-08-17 DIAGNOSIS — Z96653 Presence of artificial knee joint, bilateral: Secondary | ICD-10-CM | POA: Diagnosis not present

## 2018-09-06 ENCOUNTER — Emergency Department (HOSPITAL_COMMUNITY): Payer: BLUE CROSS/BLUE SHIELD

## 2018-09-06 ENCOUNTER — Encounter (HOSPITAL_COMMUNITY): Payer: Self-pay | Admitting: Emergency Medicine

## 2018-09-06 ENCOUNTER — Emergency Department (HOSPITAL_COMMUNITY)
Admission: EM | Admit: 2018-09-06 | Discharge: 2018-09-06 | Disposition: A | Discharge status: Home / Self Care | Payer: BLUE CROSS/BLUE SHIELD | Attending: Emergency Medicine | Admitting: Emergency Medicine

## 2018-09-06 ENCOUNTER — Other Ambulatory Visit: Payer: Self-pay

## 2018-09-06 DIAGNOSIS — R55 Syncope and collapse: Secondary | ICD-10-CM | POA: Diagnosis not present

## 2018-09-06 DIAGNOSIS — M25511 Pain in right shoulder: Secondary | ICD-10-CM | POA: Diagnosis not present

## 2018-09-06 DIAGNOSIS — Y998 Other external cause status: Secondary | ICD-10-CM | POA: Diagnosis not present

## 2018-09-06 DIAGNOSIS — E119 Type 2 diabetes mellitus without complications: Secondary | ICD-10-CM | POA: Diagnosis not present

## 2018-09-06 DIAGNOSIS — S161XXA Strain of muscle, fascia and tendon at neck level, initial encounter: Secondary | ICD-10-CM | POA: Diagnosis not present

## 2018-09-06 DIAGNOSIS — S0003XA Contusion of scalp, initial encounter: Secondary | ICD-10-CM | POA: Diagnosis not present

## 2018-09-06 DIAGNOSIS — Z79899 Other long term (current) drug therapy: Secondary | ICD-10-CM | POA: Insufficient documentation

## 2018-09-06 DIAGNOSIS — Z7982 Long term (current) use of aspirin: Secondary | ICD-10-CM | POA: Diagnosis not present

## 2018-09-06 DIAGNOSIS — S199XXA Unspecified injury of neck, initial encounter: Secondary | ICD-10-CM | POA: Diagnosis not present

## 2018-09-06 DIAGNOSIS — W19XXXA Unspecified fall, initial encounter: Secondary | ICD-10-CM

## 2018-09-06 DIAGNOSIS — Y93E2 Activity, laundry: Secondary | ICD-10-CM | POA: Insufficient documentation

## 2018-09-06 DIAGNOSIS — Y929 Unspecified place or not applicable: Secondary | ICD-10-CM | POA: Diagnosis not present

## 2018-09-06 DIAGNOSIS — S0990XA Unspecified injury of head, initial encounter: Secondary | ICD-10-CM | POA: Diagnosis not present

## 2018-09-06 DIAGNOSIS — M542 Cervicalgia: Secondary | ICD-10-CM | POA: Diagnosis not present

## 2018-09-06 DIAGNOSIS — W010XXA Fall on same level from slipping, tripping and stumbling without subsequent striking against object, initial encounter: Secondary | ICD-10-CM | POA: Diagnosis not present

## 2018-09-06 DIAGNOSIS — Z86718 Personal history of other venous thrombosis and embolism: Secondary | ICD-10-CM | POA: Diagnosis not present

## 2018-09-06 DIAGNOSIS — R51 Headache: Secondary | ICD-10-CM | POA: Diagnosis not present

## 2018-09-06 LAB — CBC
HCT: 44.1 % (ref 36.0–46.0)
HEMOGLOBIN: 13.6 g/dL (ref 12.0–15.0)
MCH: 28.8 pg (ref 26.0–34.0)
MCHC: 30.8 g/dL (ref 30.0–36.0)
MCV: 93.2 fL (ref 80.0–100.0)
NRBC: 0 % (ref 0.0–0.2)
PLATELETS: 333 10*3/uL (ref 150–400)
RBC: 4.73 MIL/uL (ref 3.87–5.11)
RDW: 13.6 % (ref 11.5–15.5)
WBC: 7.7 10*3/uL (ref 4.0–10.5)

## 2018-09-06 LAB — BASIC METABOLIC PANEL
Anion gap: 10 (ref 5–15)
BUN: 7 mg/dL (ref 6–20)
CALCIUM: 9.1 mg/dL (ref 8.9–10.3)
CO2: 23 mmol/L (ref 22–32)
CREATININE: 0.75 mg/dL (ref 0.44–1.00)
Chloride: 106 mmol/L (ref 98–111)
GFR calc non Af Amer: >60 mL/min (ref >60)
GLUCOSE: 133 mg/dL — AB (ref 70–99)
Potassium: 4 mmol/L (ref 3.5–5.1)
SODIUM: 139 mmol/L (ref 135–145)

## 2018-09-06 MED ORDER — FENTANYL CITRATE (PF) 100 MCG/2ML IJ SOLN
50.0000 ug | Freq: Once | INTRAMUSCULAR | Status: AC
  Administered 2018-09-06: 50 ug via INTRAVENOUS
  Filled 2018-09-06: qty 2

## 2018-09-06 MED ORDER — SODIUM CHLORIDE 0.9 % IV SOLN
Freq: Once | INTRAVENOUS | Status: AC
  Administered 2018-09-06: 18:00:00 via INTRAVENOUS

## 2018-09-06 NOTE — ED Notes (Signed)
Patient verbalizes understanding of discharge instructions. Opportunity for questioning and answers were provided. Armband removed by staff, pt discharged from ED ambulatory to home.  

## 2018-09-06 NOTE — ED Notes (Signed)
Wasted 50 mcg of Fentanyl with Microbiologist in Radiation protection practitioner

## 2018-09-06 NOTE — ED Triage Notes (Signed)
Pt states she had a near syncopal episode today while cleaning in her home. Pt states she hit the back of her head. Pt is complaining of head pain and shoulder pain. Pt just had recent knee surgery as well on her right knee

## 2018-09-06 NOTE — ED Provider Notes (Signed)
[facility] EMERGENCY DEPARTMENT Provider Note   CSN: [id number] Arrival date & time: 09/06/18  1632     History   Chief Complaint No chief complaint on file.   HPI [name] is a 48 y.o. female.  HPI  48 year old female history of bilateral knee replacements states that she was doing laundry today when he fell to the ground.  She states that she heard her glasses hit first.  She did not have any lightheadedness or feel presyncopal.  However she is somewhat unclear as to if that is how she fell.  Due to her knee problems she was unable to get back up.  She struck the top of her head but did not think that she lost consciousness.  She is complaining of head, neck, and right shoulder pain.  She was unable to get up until her partner returned home was able to help her up.  She denies that she has any chest pain, shortness of breath, abdominal pain, nausea, vomiting, diarrhea, recent change in medications, fever, or chills.  Past Medical History:  Diagnosis Date  . Anxiety and depression   . Arthritis   . Complication of anesthesia    has been hard to wake up post-op  . Dental crowns present   . Depression   . Diabetes ([place])    resolved with gastric bypass, no meds  . DVT of lower extremity (deep venous thrombosis) ([place]) 2012   LLE  . Dysmenorrhea    hx endometriosis and fibroid  . Family history of breast cancer   . Headache(784.0)    tension or sinus  . History of MRSA infection prior to 2012   lasted 5-6 yr.  . Hx of nipple discharge    Bilateral.  Long standing.  Negative work up.   . Jaw snapping    states jaw pops  . Left foot drop 2016   --[facility]  . Neuromuscular disorder ([place])    neuropathy in hands,feet,face  . Neuropathy 2015   hands, feet and face--[facility]  . Prothrombin gene mutation ([place]) 2018   single gene mutation  . Right knee pain    hx meniscal injury, chondromalacia, OA  . Thyroid nodule     Patient  Active Problem List   Diagnosis Date Noted  . Primary osteoarthritis of right knee 05/26/2018  . Bilateral nipple discharge 09/19/2017  . Primary localized osteoarthritis of left knee 07/22/2017  . Primary osteoarthritis of left knee 07/22/2017  . IDA (iron deficiency anemia) 05/20/2017  . Effusion of left knee 05/17/2017  . Degenerative arthritis of right knee 02/13/2017  . Degenerative arthritis of left knee 02/13/2017  . Numbness and tingling of both lower extremities 10/18/2016  . Genetic testing 10/18/2016  . Family history of breast cancer   . Multinodular goiter 04/24/2015  . Mood disorder ([place]) 04/12/2015  . Peroneal mononeuropathy 08/22/2014  . Left foot drop 07/21/2014  . Menorrhagia with irregular cycle 04/29/2014  . S/P gastric bypass 11/02/2013  . Hyperlipidemia 10/08/2013  . Primary localized osteoarthrosis, lower leg 07/06/2013  . Obesity, BMI unknown 04/23/2013  . IUD contraception - inserted summer 2013 04/09/2013    Past Surgical History:  Procedure Laterality Date  . CHOLECYSTECTOMY  1990's  . DILITATION & CURRETTAGE/HYSTROSCOPY WITH NOVASURE ABLATION N/A 05/30/2014   Procedure: DILATATION & CURETTAGE/HYSTEROSCOPY WITH attempted [place] and with IUD removal ;  Surgeon: Jamey Reas de Berton Lan, MD;  Location: [facility];  Service: Gynecology;  Laterality: N/A;  . GASTRIC ROUX-EN-Y N/A 11/02/2013   Procedure: LAPAROSCOPIC ROUX-EN-Y GASTRIC BYPASS WITH UPPER ENDOSCOPY;  Surgeon: Gayland Curry, MD;  Location: [facility];  Service: General;  Laterality: N/A;  . HAND SURGERY Left    X 3 - spider bite and MRSA infection  . HYSTEROSCOPY WITH RESECTOSCOPE N/A 05/30/2014   Procedure: HYSTEROSCOPY WITH RESECTOSCOPE;  Surgeon: Jamey Reas de Berton Lan, MD;  Location: [facility];  Service: Gynecology;  Laterality: N/A;  . JOINT REPLACEMENT    . KNEE ARTHROSCOPY Right 12/29/2012   Procedure: RIGHT KNEE ARTHROSCOPY  PARTIAL MEDIAL AND LATERAL MENISECTOMY  WITH CHONDROPLASTY;  Surgeon: Hessie Dibble, MD;  Location: [facility];  Service: Orthopedics;  Laterality: Right;  . PELVIC LAPAROSCOPY  1990's   d/t endometriosis  . TONSILLECTOMY  as child  . TOTAL KNEE ARTHROPLASTY Left 07/22/2017  . TOTAL KNEE ARTHROPLASTY Left 07/22/2017   Procedure: TOTAL KNEE ARTHROPLASTY;  Surgeon: Melrose Nakayama, MD;  Location: [facility];  Service: Orthopedics;  Laterality: Left;  . TOTAL KNEE ARTHROPLASTY Right 05/26/2018  . TOTAL KNEE ARTHROPLASTY Right 05/26/2018   Procedure: RIGHT TOTAL KNEE ARTHROPLASTY;  Surgeon: Melrose Nakayama, MD;  Location: [facility];  Service: Orthopedics;  Laterality: Right;  . WRIST SURGERY Right 2005     OB History    Gravida  3   Para  1   Term  1   Preterm      AB  2   Living  1     SAB  2   TAB      Ectopic      Multiple      Live Births               Home Medications    Prior to Admission medications   Medication Sig Start Date End Date Taking? Authorizing Provider  Acetaminophen (ACETAMINOPHEN EXTRA STRENGTH) 167 MG/5ML LIQD Take 500 mg by mouth 2 (two) times daily as needed (for headache or pain).    [provider]  aspirin EC 325 MG EC tablet Take 1 tablet (325 mg total) by mouth 2 (two) times daily after a meal. 05/27/18   Loni Dolly, PA-C  DULoxetine (CYMBALTA) 60 MG capsule Take 120 mg by mouth every morning.  08/28/12   [provider]  HYDROcodone-acetaminophen (NORCO/VICODIN) 5-325 MG tablet Take 1-2 tablets by mouth every 6 (six) hours as needed for moderate pain (pain score 4-6). 05/27/18   Loni Dolly, PA-C  Melatonin 1 MG SUBL Place 3 mg under the tongue at bedtime. Patient not taking: Reported on 05/11/2018 06/12/16   Dohmeier, Asencion Partridge, MD  Multiple Vitamin (MULTIVITAMIN) tablet Take 2 tablets by mouth 2 (two) times daily.     [provider]  tiZANidine (ZANAFLEX) 4 MG tablet Take 1 tablet (4 mg total) by mouth every 6 (six) hours as needed. 05/27/18  05/27/19  Loni Dolly, PA-C  Vitamin D, Ergocalciferol, (DRISDOL) 50000 units CAPS capsule Take 1 capsule (50,000 Units total) by mouth every 7 (seven) days. Patient not taking: Reported on 05/11/2018 02/13/17   Lyndal Pulley, DO    Family History Family History  Problem Relation Age of Onset  . Diabetes Mother        Living  . Kidney disease Mother   . Hypertension Mother   . Breast cancer Mother 59       dx'd with breast ca x2; 2nd around 48  . Diabetes Maternal Grandmother 44  dec  . Breast cancer Maternal Grandmother        dx in her 27s  . Arthritis Father   . Healthy Sister   . Healthy Sister   . Diabetes Maternal Grandfather   . Stroke Maternal Grandfather   . Healthy Daughter   . Breast cancer Other        2 great aunts  . Thyroid disease Neg Hx     Social History Social History   Tobacco Use  . Smoking status: Never Smoker  . Smokeless tobacco: Never Used  Substance Use Topics  . Alcohol use: Yes    Alcohol/week: 2.0 - 4.0 standard drinks    Types: 2 - 4 Glasses of wine per week    Comment: 2-4 glasses of wine a few times per month  . Drug use: No     Allergies   Bactrim [sulfamethoxazole-trimethoprim]; Ibuprofen; Lamotrigine; and Sulfa antibiotics   Review of Systems Review of Systems  All other systems reviewed and are negative.    Physical Exam Updated Vital Signs BP 128/87 (BP Location: Right Arm)   Pulse 83   Temp 98.6 F (37 C) (Oral)   Resp 17   SpO2 98%   Physical Exam Vitals signs and nursing note reviewed.  Constitutional:      Appearance: Normal appearance. She is obese. She is not ill-appearing or diaphoretic.  HENT:     Head: Normocephalic.     Comments: Contusion to top of head with some tenderness     Right Ear: Tympanic membrane and external ear normal.     Left Ear: Tympanic membrane and external ear normal.     Nose: Nose normal.     Mouth/Throat:     Mouth: Mucous membranes are moist.  Eyes:     Extraocular  Movements: Extraocular movements intact.     Conjunctiva/sclera: Conjunctivae normal.     Pupils: Pupils are equal, round, and reactive to light.  Neck:     Comments: Mild diffuse tenderness palpation No obvious step-off or signs of trauma Cervical collar applied Cardiovascular:     Rate and Rhythm: Normal rate and regular rhythm.     Pulses: Normal pulses.     Heart sounds: Normal heart sounds.  Pulmonary:     Effort: Pulmonary effort is normal.     Breath sounds: Normal breath sounds.  Abdominal:     General: Abdomen is flat.     Palpations: Abdomen is soft.  Musculoskeletal: Normal range of motion.        General: No swelling, tenderness or signs of injury.  Skin:    General: Skin is warm and dry.     Capillary Refill: Capillary refill takes less than 2 seconds.  Neurological:     General: No focal deficit present.     Mental Status: She is alert and oriented to person, place, and time. Mental status is at baseline.     Cranial Nerves: No cranial nerve deficit.     Sensory: No sensory deficit.     Motor: No weakness.  Psychiatric:        Mood and Affect: Mood normal.      ED Treatments / Results  Labs (all labs ordered are listed, but only abnormal results are displayed) Labs Reviewed  CBC  BASIC METABOLIC PANEL    EKG EKG Interpretation  Date/Time:  Sunday September 06 2018 16:54:10 EST Ventricular Rate:  90 PR Interval:    QRS Duration: 94 QT Interval:  380 QTC  Calculation: 465 R Axis:   14 Text Interpretation:  Normal sinus rhythm No significant change since last tracing Confirmed by Pattricia Boss [phone]) on 09/06/2018 5:19:09 PM   Radiology Ct Head Wo Contrast  Result Date: 09/06/2018 CLINICAL DATA:  [place] with trauma to the forehead. Headache and neck pain. EXAM: CT HEAD WITHOUT CONTRAST CT CERVICAL SPINE WITHOUT CONTRAST TECHNIQUE: Multidetector CT imaging of the head and cervical spine was performed following the standard protocol without intravenous  contrast. Multiplanar CT image reconstructions of the cervical spine were also generated. COMPARISON:  MRI 03/03/2015 FINDINGS: CT HEAD FINDINGS Brain: The brain shows a normal appearance without evidence of malformation, atrophy, old or acute small or large vessel infarction, mass lesion, hemorrhage, hydrocephalus or extra-axial collection. Vascular: No hyperdense vessel. No evidence of atherosclerotic calcification. Skull: No traumatic finding. Hyperostosis frontalis interna incidentally noted. Sinuses/Orbits: Sinuses are clear. Orbits appear normal. Mastoids are clear. Other: None significant CT CERVICAL SPINE FINDINGS Alignment: Normal Skull base and vertebrae: Normal Soft tissues and spinal canal: Normal Disc levels: No evidence of degenerative disc disease. No canal or foraminal stenosis. Upper chest: Normal Other: None IMPRESSION: Head CT: Normal. Cervical spine CT: Normal Electronically Signed   By: Nelson Chimes M.D.   On: 09/06/2018 17:45   Ct Cervical Spine Wo Contrast  Result Date: 09/06/2018 CLINICAL DATA:  [place] with trauma to the forehead. Headache and neck pain. EXAM: CT HEAD WITHOUT CONTRAST CT CERVICAL SPINE WITHOUT CONTRAST TECHNIQUE: Multidetector CT imaging of the head and cervical spine was performed following the standard protocol without intravenous contrast. Multiplanar CT image reconstructions of the cervical spine were also generated. COMPARISON:  MRI 03/03/2015 FINDINGS: CT HEAD FINDINGS Brain: The brain shows a normal appearance without evidence of malformation, atrophy, old or acute small or large vessel infarction, mass lesion, hemorrhage, hydrocephalus or extra-axial collection. Vascular: No hyperdense vessel. No evidence of atherosclerotic calcification. Skull: No traumatic finding. Hyperostosis frontalis interna incidentally noted. Sinuses/Orbits: Sinuses are clear. Orbits appear normal. Mastoids are clear. Other: None significant CT CERVICAL SPINE FINDINGS Alignment: Normal Skull  base and vertebrae: Normal Soft tissues and spinal canal: Normal Disc levels: No evidence of degenerative disc disease. No canal or foraminal stenosis. Upper chest: Normal Other: None IMPRESSION: Head CT: Normal. Cervical spine CT: Normal Electronically Signed   By: Nelson Chimes M.D.   On: 09/06/2018 17:45   Dg Chest Port 1 View  Result Date: 09/06/2018 CLINICAL DATA:  Syncopal episode today. EXAM: PORTABLE CHEST 1 VIEW COMPARISON:  Dec 18, 2017 FINDINGS: The heart size and mediastinal contours are within normal limits. Both lungs are clear. The visualized skeletal structures are unremarkable. IMPRESSION: No active disease. Electronically Signed   By: Dorise Bullion III M.D   On: 09/06/2018 17:42    Procedures Procedures (including critical care time)  Medications Ordered in ED Medications  0.9 %  sodium chloride infusion (has no administration in time range)  fentaNYL (SUBLIMAZE) injection 50 mcg (has no administration in time range)     Initial Impression / Assessment and Plan / ED Course  I have reviewed the triage vital signs and the nursing notes.  Pertinent labs & imaging results that were available during my care of the patient were reviewed by me and considered in my medical decision making (see chart for details).    48 year old female who had a fall today.  She was evaluated for possible syncope.  Labs, EKG, and CT scans without any evidence of acute abnormality.  Patient remained stable here  in the ED.  Is advised regarding return precautions and need for follow-up and voices understanding.  Final Clinical Impressions(s) / ED Diagnoses   Final diagnoses:  Fall, initial encounter  Contusion of scalp, initial encounter  Strain of neck muscle, initial encounter    ED Discharge Orders    None       Pattricia Boss, MD 09/06/18 [phone]

## 2018-09-06 NOTE — Discharge Instructions (Addendum)
The CT scan of your head and neck showed no evidence of fracture or bleeding.  X-Debora [place] of the chest revealed no evidence of acute fracture.  Labs are normal.  Please use Tylenol or ibuprofen as needed for pain.  Cool compresses to head.  Please recheck with your doctor this week.

## 2018-09-08 ENCOUNTER — Telehealth: Payer: Self-pay | Admitting: *Deleted

## 2018-09-08 NOTE — Telephone Encounter (Signed)
-----   Message from Lucretia Kern, DO sent at 09/08/2018 12:57 PM EST ----- Please call her and see if we can get her in when 30 minutes available. Looks like misscheduled... thanks. If no other time is ok for her then black 15 late and ask her to come a little early. ----- Message ----- From: Agnes Lawrence, CMA Sent: 09/08/2018  11:19 AM EST To: Lucretia Kern, DO  There is a patient scheduled after Ms Eisenbeis.  Would like to have a spot blocked for later? Wendie Simmer  ----- Message ----- From: Lucretia Kern, DO Sent: 09/08/2018  11:10 AM EST To: Agnes Lawrence, CMA  In 15 minute slot Thursday but needs a 30 minute slot if possible as was just in the ER . Thanks!

## 2018-09-08 NOTE — Telephone Encounter (Signed)
Unable to leave a message due to voicemail being full-will attempt to call later.  CRM also created.

## 2018-09-09 NOTE — Telephone Encounter (Signed)
-----   Message from Lucretia Kern, DO sent at 09/08/2018 12:57 PM EST ----- Please call her and see if we can get her in when 30 minutes available. Looks like misscheduled... thanks. If no other time is ok for her then black 15 late and ask her to come a little early. ----- Message ----- From: Agnes Lawrence, CMA Sent: 09/08/2018  11:19 AM EST To: Lucretia Kern, DO  There is a patient scheduled after Ms Servantes.  Would like to have a spot blocked for later? Wendie Simmer  ----- Message ----- From: Lucretia Kern, DO Sent: 09/08/2018  11:10 AM EST To: Agnes Lawrence, CMA  In 15 minute slot Thursday but needs a 30 minute slot if possible as was just in the ER . Thanks!

## 2018-09-09 NOTE — Telephone Encounter (Signed)
I called the pt and changed the appt to 2:30pm.

## 2018-09-10 ENCOUNTER — Ambulatory Visit: Payer: BLUE CROSS/BLUE SHIELD | Admitting: Family Medicine

## 2018-09-10 ENCOUNTER — Encounter: Payer: Self-pay | Admitting: Family Medicine

## 2018-09-10 VITALS — BP 120/82 | HR 91 | Temp 98.1°F | Ht 68.0 in | Wt 250.8 lb

## 2018-09-10 DIAGNOSIS — S0083XA Contusion of other part of head, initial encounter: Secondary | ICD-10-CM

## 2018-09-10 DIAGNOSIS — R030 Elevated blood-pressure reading, without diagnosis of hypertension: Secondary | ICD-10-CM | POA: Diagnosis not present

## 2018-09-10 DIAGNOSIS — R739 Hyperglycemia, unspecified: Secondary | ICD-10-CM | POA: Diagnosis not present

## 2018-09-10 DIAGNOSIS — W19XXXA Unspecified fall, initial encounter: Secondary | ICD-10-CM

## 2018-09-10 LAB — POCT GLYCOSYLATED HEMOGLOBIN (HGB A1C): HEMOGLOBIN A1C: 5.8 % — AB (ref 4.0–5.6)

## 2018-09-10 NOTE — Patient Instructions (Addendum)
BEFORE YOU LEAVE: -hgba1c -follow up: 3 months  See neurologist as planned.  Seek care if any further falls, passing out, headache, concerns or new symptoms.  Eat a healthy low sugar diet and gradually increase exercise if ortho is ok with it.   We recommend the following healthy lifestyle for LIFE: 1) Small portions. But, make sure to get regular (at least 3 per day), healthy meals and small healthy snacks if needed.  2) Eat a healthy clean diet.   TRY TO EAT: -at least 5-7 servings of low sugar, colorful, and nutrient rich vegetables per day (not corn, potatoes or bananas.) -berries are the best choice if you wish to eat fruit (only eat small amounts if trying to reduce weight)  -lean meets (fish, white meat of chicken or [place]) -vegan proteins for some meals - beans or tofu, whole grains, nuts and seeds -Replace bad fats with good fats - good fats include: fish, nuts and seeds, canola oil, olive oil -small amounts of low fat or non fat dairy -small amounts of100 % whole grains - check the lables -drink plenty of water  AVOID: -SUGAR, sweets, anything with added sugar, corn syrup or sweeteners - must read labels as even foods advertised as "healthy" often are loaded with sugar -if you must have a sweetener, small amounts of stevia may be best -sweetened beverages and artificially sweetened beverages -simple starches (rice, bread, potatoes, pasta, chips, etc - small amounts of 100% whole grains are ok) -red meat, pork, butter -fried foods, fast food, processed food, excessive dairy, eggs and coconut.  3)Get at least 150 minutes of sweaty aerobic exercise per week.  4)Reduce stress - consider counseling, meditation and relaxation to balance other aspects of your life. +

## 2018-09-10 NOTE — Progress Notes (Signed)
HPI:  Using dictation device. Unfortunately this device frequently misinterprets words/phrases.  Acute visit for : -Evaluated in the emergency room on 09/06/2018 status post a fall while doing laundry.  Per notes, she hit her head and presented with head, neck and right shoulder pain.  He has a history of bilateral knee replacements. -She had a fairly extensive evaluation in the emergency room for this with labs, chest x-ray, EKG and CT scans of the head and neck without any significant findings -Today reports: feeling much better, mild TTP bruise on head, neck and shoulder pain better - thinks she tripped on something from neuropathy, has follow up with neurology -Denies:no fevers, malaise, ha, vision loss, cp, sob, doe, palpitations -She sees a specialist in [facility] for headaches and paresthesias, she had a normal MRI done recently in 04/2018 -sees integrative doctor as well -seeing ortho for knees -has not been as good about diet and exercise  ROS: See pertinent positives and negatives per HPI.  Past Medical History:  Diagnosis Date  . Anxiety and depression   . Arthritis   . Complication of anesthesia    has been hard to wake up post-op  . Dental crowns present   . Depression   . Diabetes ([place])    resolved with gastric bypass, no meds  . DVT of lower extremity (deep venous thrombosis) ([place]) 2012   LLE  . Dysmenorrhea    hx endometriosis and fibroid  . Family history of breast cancer   . Headache(784.0)    tension or sinus  . History of MRSA infection prior to 2012   lasted 5-6 yr.  . Hx of nipple discharge    Bilateral.  Long standing.  Negative work up.   . Jaw snapping    states jaw pops  . Left foot drop 2016   --[facility]  . Neuromuscular disorder ([place])    neuropathy in hands,feet,face  . Neuropathy 2015   hands, feet and face--[place] [facility]  . Prothrombin gene mutation ([place]) 2018   single gene mutation  . Right knee pain    hx meniscal injury,  chondromalacia, OA  . Thyroid nodule     Past Surgical History:  Procedure Laterality Date  . CHOLECYSTECTOMY  1990's  . DILITATION & CURRETTAGE/HYSTROSCOPY WITH NOVASURE ABLATION N/A 05/30/2014   Procedure: DILATATION & CURETTAGE/HYSTEROSCOPY WITH attempted [place] and with IUD removal ;  Surgeon: Jamey Reas de Berton Lan, MD;  Location: [facility];  Service: Gynecology;  Laterality: N/A;  . GASTRIC ROUX-EN-Y N/A 11/02/2013   Procedure: LAPAROSCOPIC ROUX-EN-Y GASTRIC BYPASS WITH UPPER ENDOSCOPY;  Surgeon: Gayland Curry, MD;  Location: [facility];  Service: General;  Laterality: N/A;  . HAND SURGERY Left    X 3 - spider bite and MRSA infection  . HYSTEROSCOPY WITH RESECTOSCOPE N/A 05/30/2014   Procedure: HYSTEROSCOPY WITH RESECTOSCOPE;  Surgeon: Jamey Reas de Berton Lan, MD;  Location: [facility];  Service: Gynecology;  Laterality: N/A;  . JOINT REPLACEMENT    . KNEE ARTHROSCOPY Right 12/29/2012   Procedure: RIGHT KNEE ARTHROSCOPY  PARTIAL MEDIAL AND LATERAL MENISECTOMY WITH CHONDROPLASTY;  Surgeon: Hessie Dibble, MD;  Location: [facility];  Service: Orthopedics;  Laterality: Right;  . PELVIC LAPAROSCOPY  1990's   d/t endometriosis  . TONSILLECTOMY  as child  . TOTAL KNEE ARTHROPLASTY Left 07/22/2017  . TOTAL KNEE ARTHROPLASTY Left 07/22/2017   Procedure: TOTAL KNEE ARTHROPLASTY;  Surgeon: Melrose Nakayama, MD;  Location: [employer] [facility];  Service: Orthopedics;  Laterality: Left;  . TOTAL KNEE ARTHROPLASTY Right 05/26/2018  . TOTAL KNEE ARTHROPLASTY Right 05/26/2018   Procedure: RIGHT TOTAL KNEE ARTHROPLASTY;  Surgeon: Melrose Nakayama, MD;  Location: [facility];  Service: Orthopedics;  Laterality: Right;  . WRIST SURGERY Right 2005    Family History  Problem Relation Age of Onset  . Diabetes Mother        Living  . Kidney disease Mother   . Hypertension Mother   . Breast cancer Mother 27       dx'd with breast ca x2; 2nd around 62  . Diabetes Maternal  Grandmother 97       dec  . Breast cancer Maternal Grandmother        dx in her 17s  . Arthritis Father   . Healthy Sister   . Healthy Sister   . Diabetes Maternal Grandfather   . Stroke Maternal Grandfather   . Healthy Daughter   . Breast cancer Other        2 great aunts  . Thyroid disease Neg Hx     SOCIAL HX: see hpi   Current Outpatient Medications:  .  Acetaminophen (ACETAMINOPHEN EXTRA STRENGTH) 167 MG/5ML LIQD, Take 500 mg by mouth 2 (two) times daily as needed (for headache or pain)., Disp: , Rfl:  .  Acetaminophen (TYLENOL PO), Take 1 capsule by mouth every 8 (eight) hours., Disp: , Rfl:  .  aspirin EC 325 MG EC tablet, Take 1 tablet (325 mg total) by mouth 2 (two) times daily after a meal., Disp: 30 tablet, Rfl: 0 .  DULoxetine (CYMBALTA) 60 MG capsule, Take 120 mg by mouth every morning. , Disp: , Rfl:  .  Melatonin 1 MG SUBL, Place 3 mg under the tongue at bedtime., Disp: 1 tablet, Rfl: 2 .  Vitamin D, Ergocalciferol, (DRISDOL) 50000 units CAPS capsule, Take 1 capsule (50,000 Units total) by mouth every 7 (seven) days., Disp: 12 capsule, Rfl: 0  EXAM:  Vitals:   09/10/18 1414  BP: 120/82  Pulse: 91  Temp: 98.1 F (36.7 C)  SpO2: 98%    Body mass index is 38.13 kg/m.  GENERAL: vitals reviewed and listed above, alert, oriented, appears well hydrated and in no acute distress  HEENT: healing contusion forehead, conjunttiva clear, no obvious abnormalities on inspection of external nose and ears  NECK: no obvious masses on inspection  LUNGS: clear to auscultation bilaterally, no wheezes, rales or rhonchi, good air movement  CV: HRRR, no peripheral edema  MS: moves all extremities without noticeable abnormality  PSYCH/neuro: pleasant and cooperative, no obvious depression or anxiety, cn II-XII grossly intact, finger to nose normal, finger to nose normal, visual field testing neg, speech and though processing grossly intact, gait normal  ASSESSMENT AND  PLAN:  Discussed the following assessment and plan:  Fall, initial encounter  Contusion of other part of head, initial encounter  Elevated blood sugar - Plan: POC HgB A1c  Elevated blood pressure reading  Morbid obesity ([place])  -we discussed possible serious and likely etiologies, workup and treatment, treatment risks and return precautions - she feels likely was mechanical fall with mild amnesia from head contusion. Seems to be recovering well. Does sound like this is likely as had no other symptoms, but did discuss causes of syncope and she had eval in er for this. She will be seeing her neurologist and offered cards eval, declined. Advised recheck hgba1c. Advised treatment for mildly elevated blood pressure, implications and options and  for weight. -after this discussion, Elyna opted for follow up with neurologist, hgba1c here (5.8), lifestyle changes for blood pressure, recheck 3 months -of course, we advised Grettel  to return or notify a doctor immediately if symptoms worsen or persist or new concerns arise.   Patient Instructions  BEFORE YOU LEAVE: -hgba1c -follow up: 3 months  See neurologist as planned.  Seek care if any further falls, passing out, headache, concerns or new symptoms.  Eat a healthy low sugar diet and gradually increase exercise if ortho is ok with it.   We recommend the following healthy lifestyle for LIFE: 1) Small portions. But, make sure to get regular (at least 3 per day), healthy meals and small healthy snacks if needed.  2) Eat a healthy clean diet.   TRY TO EAT: -at least 5-7 servings of low sugar, colorful, and nutrient rich vegetables per day (not corn, potatoes or bananas.) -berries are the best choice if you wish to eat fruit (only eat small amounts if trying to reduce weight)  -lean meets (fish, white meat of chicken or [place]) -vegan proteins for some meals - beans or tofu, whole grains, nuts and seeds -Replace bad fats with good fats -  good fats include: fish, nuts and seeds, canola oil, olive oil -small amounts of low fat or non fat dairy -small amounts of100 % whole grains - check the lables -drink plenty of water  AVOID: -SUGAR, sweets, anything with added sugar, corn syrup or sweeteners - must read labels as even foods advertised as "healthy" often are loaded with sugar -if you must have a sweetener, small amounts of stevia may be best -sweetened beverages and artificially sweetened beverages -simple starches (rice, bread, potatoes, pasta, chips, etc - small amounts of 100% whole grains are ok) -red meat, pork, butter -fried foods, fast food, processed food, excessive dairy, eggs and coconut.  3)Get at least 150 minutes of sweaty aerobic exercise per week.  4)Reduce stress - consider counseling, meditation and relaxation to balance other aspects of your life. +   Lucretia Kern, DO

## 2018-09-16 DIAGNOSIS — R200 Anesthesia of skin: Secondary | ICD-10-CM | POA: Diagnosis not present

## 2018-09-16 DIAGNOSIS — E538 Deficiency of other specified B group vitamins: Secondary | ICD-10-CM | POA: Diagnosis not present

## 2018-09-16 DIAGNOSIS — E60 Dietary zinc deficiency: Secondary | ICD-10-CM | POA: Diagnosis not present

## 2018-09-16 DIAGNOSIS — G629 Polyneuropathy, unspecified: Secondary | ICD-10-CM | POA: Diagnosis not present

## 2018-09-16 DIAGNOSIS — R682 Dry mouth, unspecified: Secondary | ICD-10-CM | POA: Diagnosis not present

## 2018-09-16 DIAGNOSIS — E1169 Type 2 diabetes mellitus with other specified complication: Secondary | ICD-10-CM | POA: Diagnosis not present

## 2018-09-30 ENCOUNTER — Encounter: Payer: Self-pay | Admitting: Family Medicine

## 2018-10-01 ENCOUNTER — Ambulatory Visit: Payer: BLUE CROSS/BLUE SHIELD | Admitting: Family Medicine

## 2018-10-01 ENCOUNTER — Encounter: Payer: Self-pay | Admitting: Family Medicine

## 2018-10-01 VITALS — BP 122/74 | HR 74 | Temp 98.4°F | Ht 68.0 in | Wt 246.6 lb

## 2018-10-01 DIAGNOSIS — T148XXA Other injury of unspecified body region, initial encounter: Secondary | ICD-10-CM

## 2018-10-01 NOTE — Progress Notes (Signed)
HPI:  Using dictation device. Unfortunately this device frequently misinterprets words/phrases.  Lesion on leg: -for about 2 weeks or so -she cant remember trauma to are, but did have a fall and may have hit it -she felt should be resolve dby now -no fever, malaise, bleeding, bruising or lesion elswhere  ROS: See pertinent positives and negatives per HPI.  Past Medical History:  Diagnosis Date  . Anxiety and depression   . Arthritis   . Complication of anesthesia    has been hard to wake up post-op  . Dental crowns present   . Depression   . Diabetes ([place])    resolved with gastric bypass, no meds  . DVT of lower extremity (deep venous thrombosis) ([place]) 2012   LLE  . Dysmenorrhea    hx endometriosis and fibroid  . Family history of breast cancer   . Headache(784.0)    tension or sinus  . History of MRSA infection prior to 2012   lasted 5-6 yr.  . Hx of nipple discharge    Bilateral.  Long standing.  Negative work up.   . Jaw snapping    states jaw pops  . Left foot drop 2016   --[facility]  . Neuromuscular disorder ([place])    neuropathy in hands,feet,face  . Neuropathy 2015   hands, feet and face-- neurology  . Prothrombin gene mutation ([place]) 2018   single gene mutation  . Right knee pain    hx meniscal injury, chondromalacia, OA  . Thyroid nodule     Past Surgical History:  Procedure Laterality Date  . CHOLECYSTECTOMY  1990's  . DILITATION & CURRETTAGE/HYSTROSCOPY WITH NOVASURE ABLATION N/A 05/30/2014   Procedure: DILATATION & CURETTAGE/HYSTEROSCOPY WITH attempted [place] and with IUD removal ;  Surgeon: Jamey Reas de Berton Lan, MD;  Location: [facility];  Service: Gynecology;  Laterality: N/A;  . GASTRIC ROUX-EN-Y N/A 11/02/2013   Procedure: LAPAROSCOPIC ROUX-EN-Y GASTRIC BYPASS WITH UPPER ENDOSCOPY;  Surgeon: Gayland Curry, MD;  Location[facility];  Service: General;  Laterality: N/A;  . HAND SURGERY Left    X 3 - spider bite and  MRSA infection  . HYSTEROSCOPY WITH RESECTOSCOPE N/A 05/30/2014   Procedure: HYSTEROSCOPY WITH RESECTOSCOPE;  Surgeon: Jamey Reas de Berton Lan, MD;  Location: [facility];  Service: Gynecology;  Laterality: N/A;  . JOINT REPLACEMENT    . KNEE ARTHROSCOPY Right 12/29/2012   Procedure: RIGHT KNEE ARTHROSCOPY  PARTIAL MEDIAL AND LATERAL MENISECTOMY WITH CHONDROPLASTY;  Surgeon: Hessie Dibble, MD;  Location: [facility];  Service: Orthopedics;  Laterality: Right;  . PELVIC LAPAROSCOPY  1990's   d/t endometriosis  . TONSILLECTOMY  as child  . TOTAL KNEE ARTHROPLASTY Left 07/22/2017  . TOTAL KNEE ARTHROPLASTY Left 07/22/2017   Procedure: TOTAL KNEE ARTHROPLASTY;  Surgeon: Melrose Nakayama, MD;  Location: [facility];  Service: Orthopedics;  Laterality: Left;  . TOTAL KNEE ARTHROPLASTY Right 05/26/2018  . TOTAL KNEE ARTHROPLASTY Right 05/26/2018   Procedure: RIGHT TOTAL KNEE ARTHROPLASTY;  Surgeon: Melrose Nakayama, MD;  Location: [facility];  Service: Orthopedics;  Laterality: Right;  . WRIST SURGERY Right 2005    Family History  Problem Relation Age of Onset  . Diabetes Mother        Living  . Kidney disease Mother   . Hypertension Mother   . Breast cancer Mother 39       dx'd with breast ca x2; 2nd around 57  . Diabetes Maternal Grandmother 9  dec  . Breast cancer Maternal Grandmother        dx in her 48s  . Arthritis Father   . Healthy Sister   . Healthy Sister   . Diabetes Maternal Grandfather   . Stroke Maternal Grandfather   . Healthy Daughter   . Breast cancer Other        2 great aunts  . Thyroid disease Neg Hx     SOCIAL HX: see hpi   Current Outpatient Medications:  .  Acetaminophen (ACETAMINOPHEN EXTRA STRENGTH) 167 MG/5ML LIQD, Take 500 mg by mouth 2 (two) times daily as needed (for headache or pain)., Disp: , Rfl:  .  Acetaminophen (TYLENOL PO), Take 1 capsule by mouth every 8 (eight) hours., Disp: , Rfl:  .  aspirin EC 325 MG EC tablet, Take 1  tablet (325 mg total) by mouth 2 (two) times daily after a meal., Disp: 30 tablet, Rfl: 0 .  DULoxetine (CYMBALTA) 60 MG capsule, Take 120 mg by mouth every morning. , Disp: , Rfl:  .  Melatonin 1 MG SUBL, Place 3 mg under the tongue at bedtime., Disp: 1 tablet, Rfl: 2 .  UBIQUINOL PO, Take by mouth., Disp: , Rfl:  .  Vitamin D, Ergocalciferol, (DRISDOL) 50000 units CAPS capsule, Take 1 capsule (50,000 Units total) by mouth every 7 (seven) days., Disp: 12 capsule, Rfl: 0  EXAM:  Vitals:   10/01/18 1117  BP: 122/74  Pulse: 74  Temp: 98.4 F (36.9 C)    Body mass index is 37.5 kg/m.  GENERAL: vitals reviewed and listed above, alert, oriented, appears well hydrated and in no acute distress  HEENT: atraumatic, conjunttiva clear, no obvious abnormalities on inspection of external nose and ears  NECK: no obvious masses on inspection  MS: moves all extremities without noticeable abnormality  SKIN: small healing bruise approx the size of a small orange on R upper ant thigh with small hematoma  PSYCH: pleasant and cooperative, no obvious depression or anxiety  ASSESSMENT AND PLAN:  Discussed the following assessment and plan:  Bruise  -we discussed possible serious and likely etiologies, workup and treatment, treatment risks and return precautions; suspect healing bruise hematoma from trauma -after this discussion, [name] opted for ice, monitoring, specialist eval/further workup if persists or other symptoms develop -of course, we advised [name]  to return or notify a doctor immediately if symptoms worsen or persist or new concerns arise. Declined avs.  -Patient advised to return or notify a doctor immediately if symptoms worsen or persist or new concerns arise.  There are no Patient Instructions on file for this visit.  Cheryl Kern, DO

## 2018-10-02 ENCOUNTER — Ambulatory Visit: Payer: BLUE CROSS/BLUE SHIELD | Admitting: Obstetrics and Gynecology

## 2018-10-02 NOTE — Progress Notes (Signed)
48 y.o. G10P1021 Married Caucasian female here for annual exam.    Patient having painful irregular cycles. Last 2 - 11 days.  Several have been 3 days.   One that was 9 and one that was 11.  Cramps when she does not have her menses.  No further heavy bleeding.  No iron infusions.  Some night sweats.   Has known fibroids. 2 small ones - intramural and subserosal. Hx of dysmenorrhea and abnormal uterine bleeding.  EMB 2017 - benign.   Has now has had bilateral knee replacements.  PCP:  Colin Benton, DO    Patient's last menstrual period was 09/13/2018 (exact date).     Period Duration (Days): 2-9 das Period Pattern: (!) Irregular Menstrual Flow: (S) (light to moderate--different each cycle) Menstrual Control: Tampon, Maxi pad, Thin pad Dysmenorrhea: (!) Severe Dysmenorrhea Symptoms: Cramping, Headache, Diarrhea, Nausea     Sexually active: Yes.   female partner The current method of family planning is none--female partner.    Exercising: Yes.    walking Smoker:  no  Health Maintenance: Pap: 09-19-17 Neg:Neg HR HPV, 04-29-14 Neg:Neg HR HPV History of abnormal Pap:  no MMG: 01-07-18 3D Neg/density B/BiRads1 Colonoscopy:  04/10/17 Normal f/u 10 years BMD:   n/a  Result  n/a TDaP:  2014 Gardasil:   no HIV:05-19-15 NR Hep C: 05-19-15 Neg Screening Labs:  Hb today: PCP  Flu vaccine:  Done in Sept. 2019.    reports that she has never smoked. She has never used smokeless tobacco. She reports current alcohol use of about 2.0 standard drinks of alcohol per week. She reports that she does not use drugs.  Past Medical History:  Diagnosis Date  . Anxiety and depression   . Arthritis   . Complication of anesthesia    has been hard to wake up post-op  . Dental crowns present   . Depression   . Diabetes ([place])    resolved with gastric bypass, no meds  . DVT of lower extremity (deep venous thrombosis) ([place]) 2012   LLE  . Dysmenorrhea    hx endometriosis and fibroid  . Family history of  breast cancer   . Headache(784.0)    tension or sinus  . History of MRSA infection prior to 2012   lasted 5-6 yr.  . Hx of nipple discharge    Bilateral.  Long standing.  Negative work up.   . Jaw snapping    states jaw pops  . Left foot drop 2016   --[facility]  . Neuromuscular disorder ([place])    neuropathy in hands,feet,face  . Neuropathy 2015   hands, feet and face--[facility]  . Prothrombin gene mutation ([place]) 2018   single gene mutation  . Right knee pain    hx meniscal injury, chondromalacia, OA  . Thyroid nodule     Past Surgical History:  Procedure Laterality Date  . CHOLECYSTECTOMY  1990's  . DILITATION & CURRETTAGE/HYSTROSCOPY WITH NOVASURE ABLATION N/A 05/30/2014   Procedure: DILATATION & CURETTAGE/HYSTEROSCOPY WITH attempted [place] and with IUD removal ;  Surgeon: Jamey Reas de Berton Lan, MD;  Location: [facility];  Service: Gynecology;  Laterality: N/A;  . GASTRIC ROUX-EN-Y N/A 11/02/2013   Procedure: LAPAROSCOPIC ROUX-EN-Y GASTRIC BYPASS WITH UPPER ENDOSCOPY;  Surgeon: Gayland Curry, MD;  [facility];  Service: General;  Laterality: N/A;  . HAND SURGERY Left    X 3 - spider bite and MRSA infection  . HYSTEROSCOPY WITH RESECTOSCOPE N/A 05/30/2014  Procedure: HYSTEROSCOPY WITH RESECTOSCOPE;  Surgeon: Jamey Reas de Berton Lan, MD;  Location: [facility];  Service: Gynecology;  Laterality: N/A;  . JOINT REPLACEMENT    . KNEE ARTHROSCOPY Right 12/29/2012   Procedure: RIGHT KNEE ARTHROSCOPY  PARTIAL MEDIAL AND LATERAL MENISECTOMY WITH CHONDROPLASTY;  Surgeon: Hessie Dibble, MD;  Location: [facility];  Service: Orthopedics;  Laterality: Right;  . PELVIC LAPAROSCOPY  1990's   d/t endometriosis  . TONSILLECTOMY  as child  . TOTAL KNEE ARTHROPLASTY Left 07/22/2017  . TOTAL KNEE ARTHROPLASTY Left 07/22/2017   Procedure: TOTAL KNEE ARTHROPLASTY;  Surgeon: Melrose Nakayama, MD;  Location: [facility];  Service:  Orthopedics;  Laterality: Left;  . TOTAL KNEE ARTHROPLASTY Right 05/26/2018  . TOTAL KNEE ARTHROPLASTY Right 05/26/2018   Procedure: RIGHT TOTAL KNEE ARTHROPLASTY;  Surgeon: Melrose Nakayama, MD;  Location: [facility];  Service: Orthopedics;  Laterality: Right;  . WRIST SURGERY Right 2005    Current Outpatient Medications  Medication Sig Dispense Refill  . Acetaminophen (ACETAMINOPHEN EXTRA STRENGTH) 167 MG/5ML LIQD Take 500 mg by mouth 2 (two) times daily as needed (for headache or pain).    . Acetaminophen (TYLENOL PO) Take 1 capsule by mouth every 8 (eight) hours.    . DULoxetine (CYMBALTA) 60 MG capsule Take 120 mg by mouth every morning.     . Melatonin 1 MG SUBL Place 3 mg under the tongue at bedtime. 1 tablet 2  . UBIQUINOL PO Take by mouth.    . Vitamin D, Ergocalciferol, (DRISDOL) 50000 units CAPS capsule Take 1 capsule (50,000 Units total) by mouth every 7 (seven) days. 12 capsule 0  . traZODone (DESYREL) 100 MG tablet Take 1 tablet by mouth at bedtime as needed.     No current facility-administered medications for this visit.     Family History  Problem Relation Age of Onset  . Diabetes Mother        Living  . Kidney disease Mother   . Hypertension Mother   . Breast cancer Mother 64       dx'd with breast ca x2; 2nd around 32  . Diabetes Maternal Grandmother 3       dec  . Breast cancer Maternal Grandmother        dx in her 56s  . Arthritis Father   . Healthy Sister   . Healthy Sister   . Diabetes Maternal Grandfather   . Stroke Maternal Grandfather   . Healthy Daughter   . Breast cancer Other        2 great aunts  . Thyroid disease Neg Hx     Review of Systems  Hematological: Bruises/bleeds easily (has seen PCP).  All other systems reviewed and are negative.   Exam:   BP 124/72 (BP Location: Right Arm, Patient Position: Sitting, Cuff Size: Large)   Pulse 76   Resp 16   Ht 5' 7.5" (1.715 m)   Wt 248 lb (112.5 kg)   LMP 09/13/2018 (Exact Date)   BMI 38.27  kg/m     General appearance: alert, cooperative and appears stated age Head: Normocephalic, without obvious abnormality, atraumatic Neck: no adenopathy, supple, symmetrical, trachea midline and thyroid normal to inspection and palpation Lungs: clear to auscultation bilaterally Breasts: normal appearance, no masses or tenderness, No nipple retraction or dimpling, No nipple discharge or bleeding, No axillary or supraclavicular adenopathy Heart: regular rate and rhythm Abdomen: soft, non-tender; no masses, no organomegaly Extremities: extremities normal, atraumatic, no cyanosis or edema  Skin: Skin color, texture, turgor normal. No rashes or lesions Lymph nodes: Cervical, supraclavicular, and axillary nodes normal. No abnormal inguinal nodes palpated Neurologic: Grossly normal  Pelvic: External genitalia:  no lesions              Urethra:  normal appearing urethra with no masses, tenderness or lesions              Bartholins and Skenes: normal                 Vagina: normal appearing vagina with normal color and discharge, no lesions              Cervix: no lesions              Pap taken: No. Bimanual Exam:  Uterus:  normal size, contour, position, consistency, mobility, non-tender              Adnexa: no mass, fullness, tenderness              Rectal exam: Yes.  .  Confirms.              Anus:  normal sphincter tone, no lesions  Chaperone was present for exam.  Assessment:   Well woman visit with normal exam. Irregular menses. I think this is anovulatory bleeding.  Patient may be perimenopausal now.  Status post endometrial ablation.  Fibroids.  Small. Prothrombin gene mutation.  Hx DVT. Hx bilateral nipple discharge with negative work up.  FH of breast cancer. Increased lifetime risk of breast cancer - 22.1%.  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  Plan: Mammogram screening.  She will do 3D. Recommended self breast awareness. Pap and HR HPV as above. Guidelines for  Calcium, Vitamin D, regular exercise program including cardiovascular and weight bearing exercise. Proceed with breast MRI.  Labs with PCP.  She declines Micronor or Aygestin.  Follow up annually and prn.   After visit summary provided.

## 2018-10-05 ENCOUNTER — Ambulatory Visit (INDEPENDENT_AMBULATORY_CARE_PROVIDER_SITE_OTHER): Payer: BLUE CROSS/BLUE SHIELD | Admitting: Obstetrics and Gynecology

## 2018-10-05 ENCOUNTER — Encounter: Payer: Self-pay | Admitting: Obstetrics and Gynecology

## 2018-10-05 ENCOUNTER — Other Ambulatory Visit: Payer: Self-pay

## 2018-10-05 VITALS — BP 124/72 | HR 76 | Resp 16 | Ht 67.5 in | Wt 248.0 lb

## 2018-10-05 DIAGNOSIS — Z1231 Encounter for screening mammogram for malignant neoplasm of breast: Secondary | ICD-10-CM

## 2018-10-05 DIAGNOSIS — Z01419 Encounter for gynecological examination (general) (routine) without abnormal findings: Secondary | ICD-10-CM | POA: Diagnosis not present

## 2018-10-05 NOTE — Patient Instructions (Signed)

## 2018-12-10 ENCOUNTER — Ambulatory Visit: Payer: BLUE CROSS/BLUE SHIELD | Admitting: Family Medicine

## 2018-12-31 IMAGING — MG DIGITAL SCREENING BILATERAL MAMMOGRAM WITH TOMO AND CAD
8 series · 8 of 24 positions shown · non-contrast
Comparison: Previous exam(s).

CLINICAL DATA: Screening.

EXAM:
DIGITAL SCREENING BILATERAL MAMMOGRAM WITH TOMO AND CAD

[L CC synth-2D]
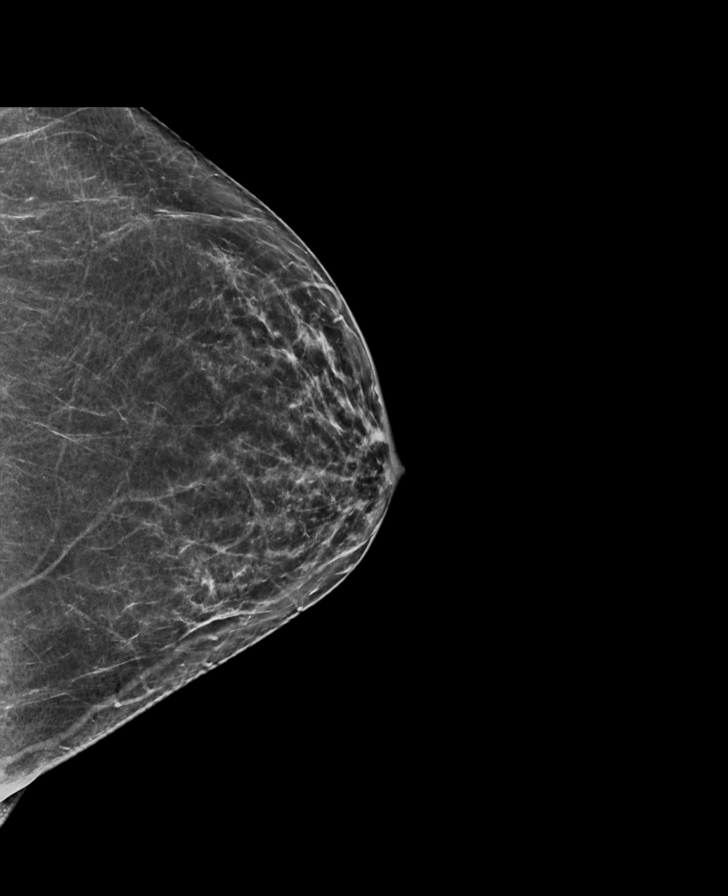

[L MLO synth-2D]
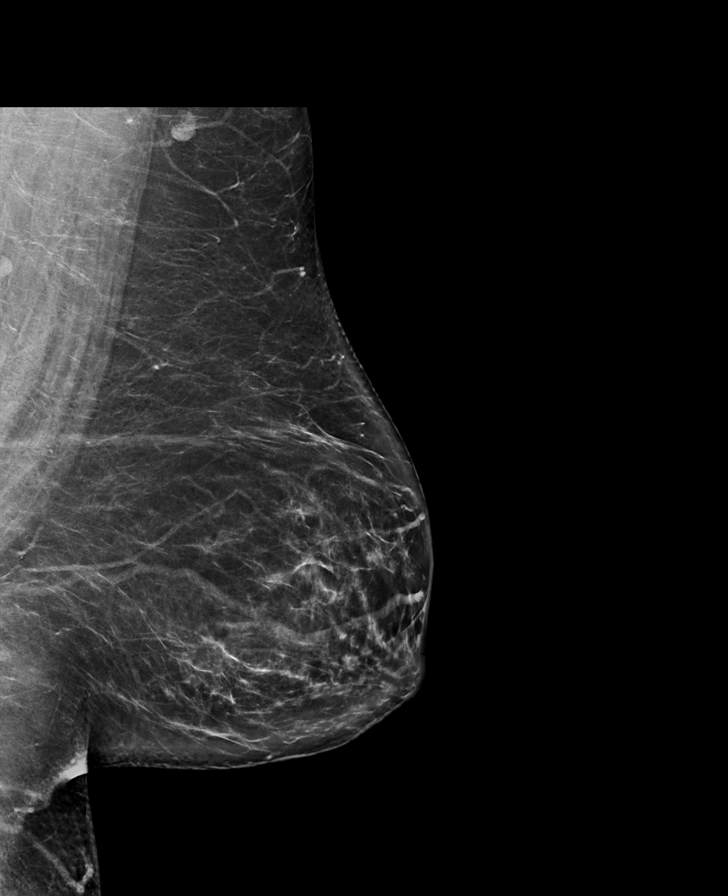

[R CC synth-2D]
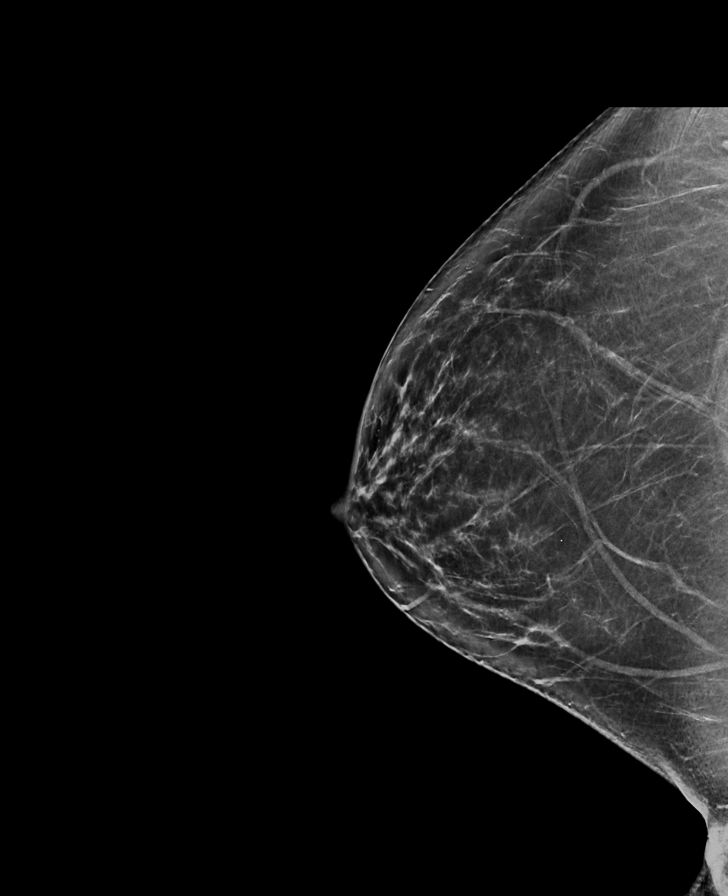

[R MLO synth-2D]
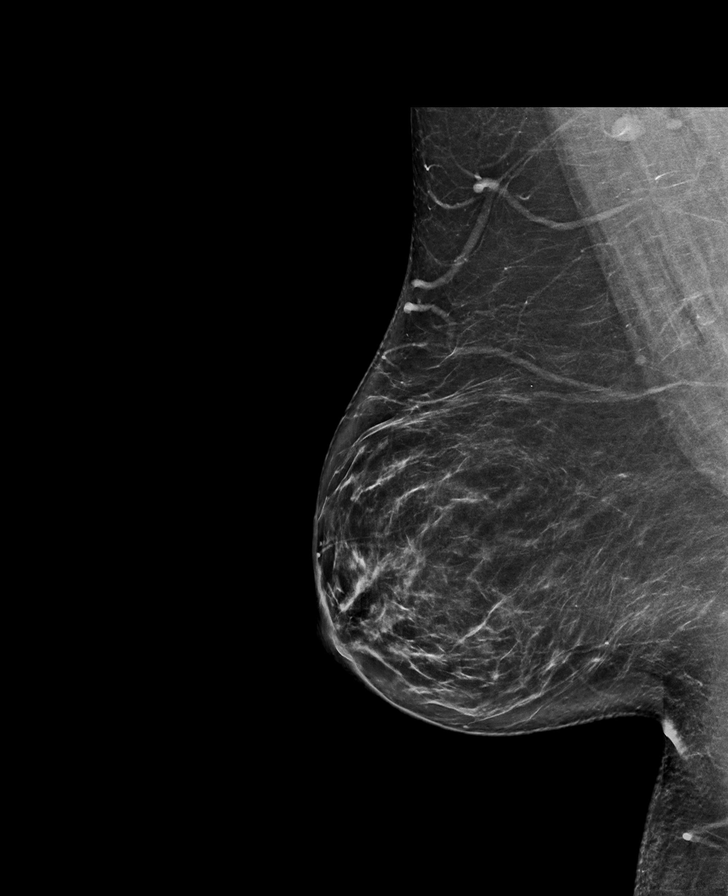

[R MLO tomo · tomo slice 44/87.0]
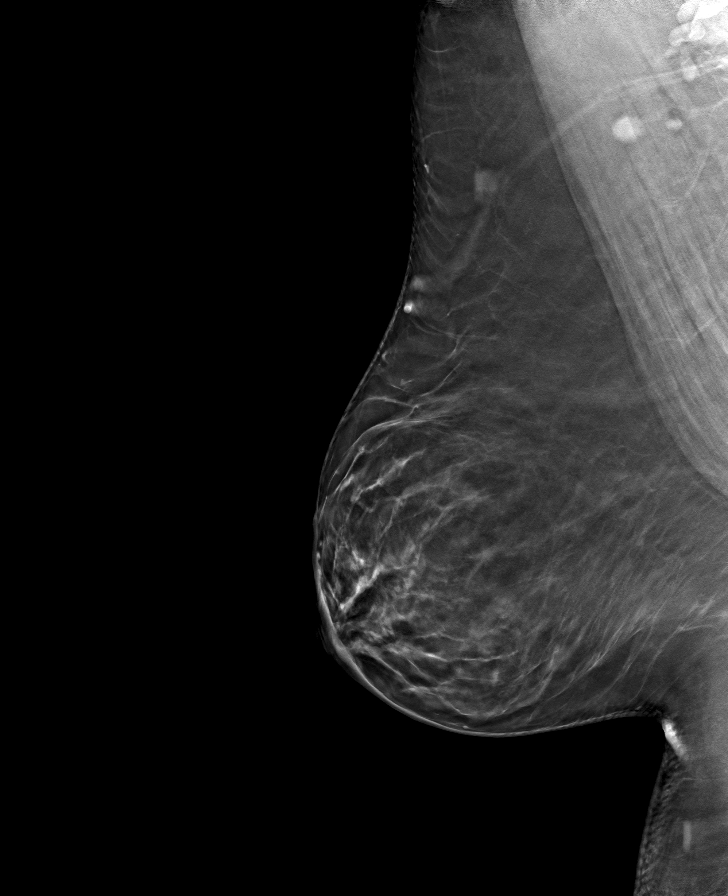

[R CC tomo · tomo slice 39/78.0]
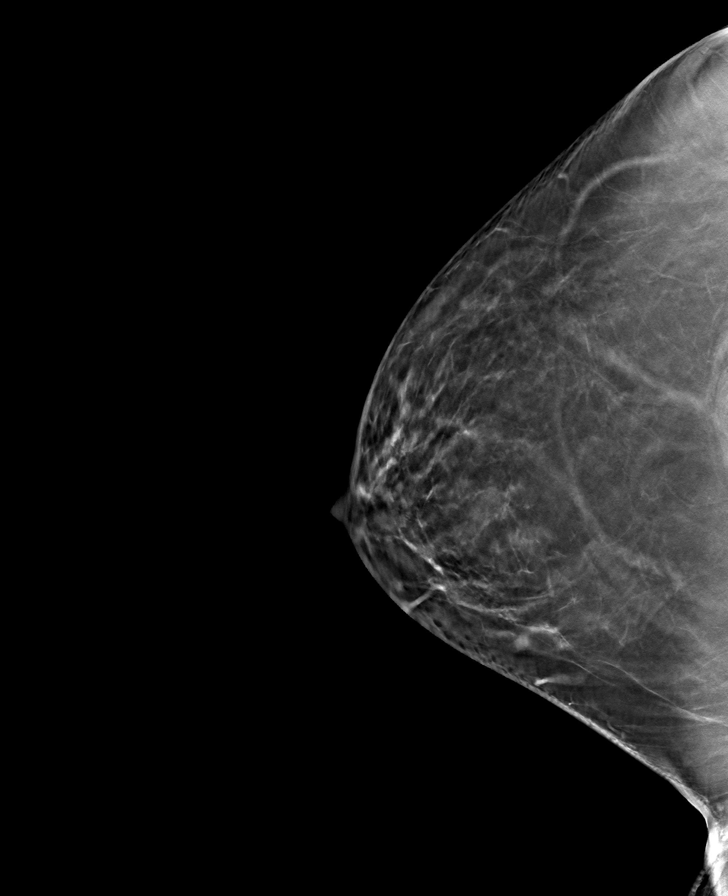

[L CC tomo · tomo slice 36/71.0]
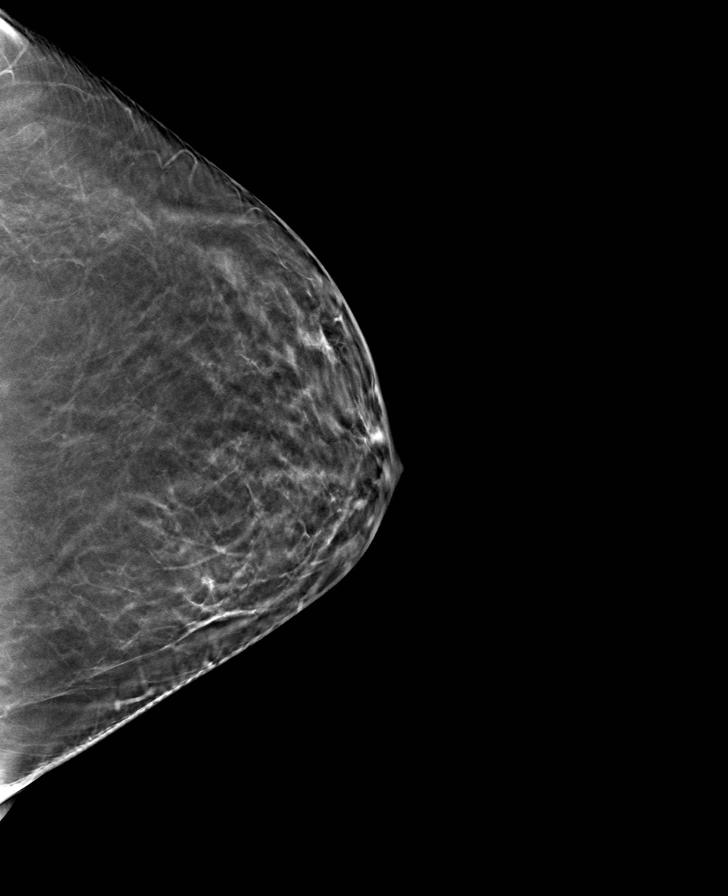

[L MLO tomo · tomo slice 45/90.0]
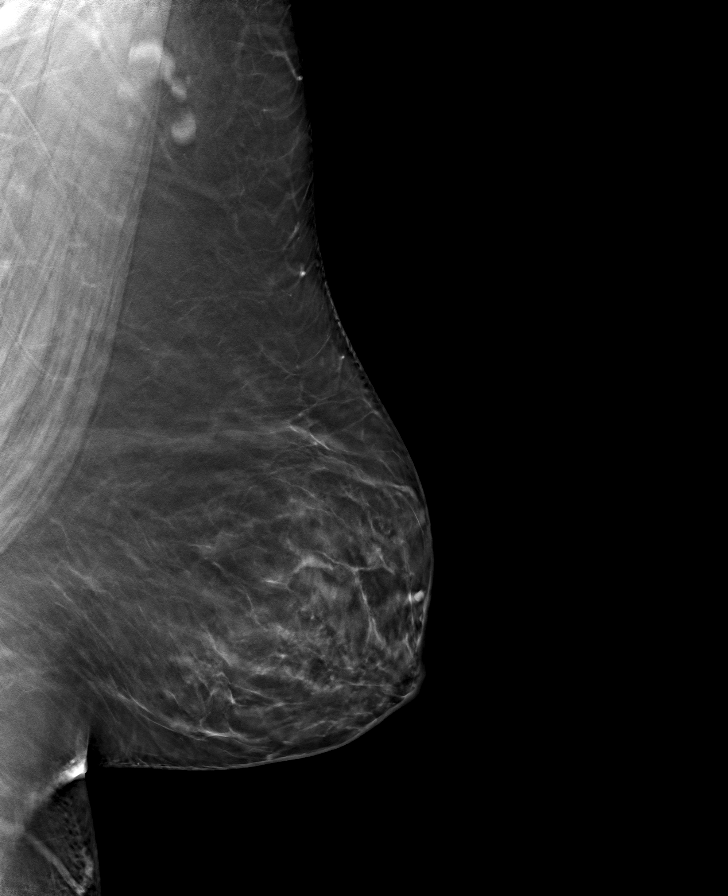

[8 of 24 positions shown; findings below may reference images not displayed]

ACR Breast Density Category b: There are scattered areas of
fibroglandular density.
FINDINGS: There are no findings suspicious for malignancy. Images were
processed with CAD.
IMPRESSION: No mammographic evidence of malignancy. A result letter of this
screening mammogram will be mailed directly to the patient.

RECOMMENDATION:
Screening mammogram in one year. (Code:[id number])

BI-RADS CATEGORY  1: Negative.

## 2019-01-01 DIAGNOSIS — H04123 Dry eye syndrome of bilateral lacrimal glands: Secondary | ICD-10-CM | POA: Diagnosis not present

## 2019-01-01 DIAGNOSIS — H5213 Myopia, bilateral: Secondary | ICD-10-CM | POA: Diagnosis not present

## 2019-01-01 DIAGNOSIS — E119 Type 2 diabetes mellitus without complications: Secondary | ICD-10-CM | POA: Diagnosis not present

## 2019-01-01 LAB — HM DIABETES EYE EXAM

## 2019-01-06 ENCOUNTER — Encounter: Payer: Self-pay | Admitting: Family Medicine

## 2019-01-15 DIAGNOSIS — M25561 Pain in right knee: Secondary | ICD-10-CM | POA: Diagnosis not present

## 2019-01-15 DIAGNOSIS — M25562 Pain in left knee: Secondary | ICD-10-CM | POA: Diagnosis not present

## 2019-01-19 DIAGNOSIS — M79602 Pain in left arm: Secondary | ICD-10-CM | POA: Diagnosis not present

## 2019-01-19 DIAGNOSIS — M79601 Pain in right arm: Secondary | ICD-10-CM | POA: Diagnosis not present

## 2019-01-19 DIAGNOSIS — G8929 Other chronic pain: Secondary | ICD-10-CM | POA: Diagnosis not present

## 2019-02-02 ENCOUNTER — Telehealth: Payer: Self-pay | Admitting: Obstetrics and Gynecology

## 2019-02-02 NOTE — Telephone Encounter (Signed)
Call placed to patient to follow up in regards to scheduling recommended breast MRI. [facility] notes indicate she was to call their office at the onset of her cycle. Left voicemail message requesting a return call. Upon return call will encourage patient to contact [facility] for scheduling.   forwarding to Dr Quincy Simmonds for review

## 2019-02-19 ENCOUNTER — Encounter: Payer: Self-pay | Admitting: Family Medicine

## 2019-03-12 ENCOUNTER — Ambulatory Visit (INDEPENDENT_AMBULATORY_CARE_PROVIDER_SITE_OTHER): Payer: BC Managed Care – PPO | Admitting: Family Medicine

## 2019-03-12 ENCOUNTER — Encounter: Payer: Self-pay | Admitting: Family Medicine

## 2019-03-12 ENCOUNTER — Other Ambulatory Visit: Payer: Self-pay

## 2019-03-12 DIAGNOSIS — R7301 Impaired fasting glucose: Secondary | ICD-10-CM

## 2019-03-12 DIAGNOSIS — E559 Vitamin D deficiency, unspecified: Secondary | ICD-10-CM

## 2019-03-12 DIAGNOSIS — R200 Anesthesia of skin: Secondary | ICD-10-CM

## 2019-03-12 DIAGNOSIS — F39 Unspecified mood [affective] disorder: Secondary | ICD-10-CM | POA: Diagnosis not present

## 2019-03-12 DIAGNOSIS — R109 Unspecified abdominal pain: Secondary | ICD-10-CM

## 2019-03-12 DIAGNOSIS — E785 Hyperlipidemia, unspecified: Secondary | ICD-10-CM

## 2019-03-12 DIAGNOSIS — E538 Deficiency of other specified B group vitamins: Secondary | ICD-10-CM

## 2019-03-12 DIAGNOSIS — Z9884 Bariatric surgery status: Secondary | ICD-10-CM

## 2019-03-12 DIAGNOSIS — R202 Paresthesia of skin: Secondary | ICD-10-CM

## 2019-03-12 DIAGNOSIS — G43809 Other migraine, not intractable, without status migrainosus: Secondary | ICD-10-CM

## 2019-03-12 NOTE — Progress Notes (Signed)
Virtual Visit via Video Note  I connected with [name]   on 03/12/19 at 10:30 AM EDT by a video enabled telemedicine application and verified that I am speaking with the correct person using two identifiers.  Location patient: home Location provider:work office Persons participating in the virtual visit: patient, provider  I discussed the limitations of evaluation and management by telemedicine and the availability of in person appointments. The patient expressed understanding and agreed to proceed.   [name] DOB: 1971-08-12 Encounter date: 03/12/2019  This is a 48 y.o. female who presents to establish care. Chief Complaint  Patient presents with  . Establish Care    History of present illness: No specific concerns today.  Anxiety/depression: on cymbalta. Started as post partum depression 15 years ago. Just didn't go away. Fluctuates. cymbalta does help with this as well as neuropathy. Tried to go off of medication and pain was worse.   Was seeing Dr. Manson Allan for headaches/neuropathy. Retiring so will follow up with one of recommended replacements. Has headache now. Come and go; some worse than others. Getting them more days of week than not. Light hurts, hurts to move. She is hesitant to take medications due to mom over-prescribed. Headaches usually last a day or so. Has migraine now. Only takes tylenol occasionally for these.   Has gene mutation making her more susceptible to blood clots which makes her more nervous about COVID exposure.   Started with neuropathy in left foot - foot drop, couldn't feel foot. Crept up leg. Not on right. Then spread to hands, then up arms - left side then right side and then face. Skin feels dull, numb to touch. But does feel burning/prickling in skin all the time. Has had extensive evaluation for this without finding reason. Foot drop did resolve after PT. Still has to be careful with walking/step. Easier to fall. Team of doctors was working  on this. Getting regular imaging of brain to follow. Dr. Manson Allan was helping with multi-specialty managing. This sx started about 4 years ago.   (Was supposed to get some bloodwork done through [place] suggested by Dr. Manson Allan - Vitamin D, B12, zinc, copper. There was also urinary test that they wanted done, but she isn't sure what this was)   Past Medical History:  Diagnosis Date  . Anxiety and depression   . Arthritis   . Complication of anesthesia    has been hard to wake up post-op  . Dental crowns present   . Depression   . Diabetes ([place])    resolved with gastric bypass, no meds  . DVT of lower extremity (deep venous thrombosis) ([place]) 2012   LLE  . Dysmenorrhea    hx endometriosis and fibroid  . Family history of breast cancer   . Headache(784.0)    tension or sinus  . History of MRSA infection prior to 2012   lasted 5-6 yr.  . Hx of nipple discharge    Bilateral.  Long standing.  Negative work up.   . Jaw snapping    states jaw pops  . Left foot drop 2016   --[facility]  . Neuromuscular disorder ([place])    neuropathy in hands,feet,face  . Neuropathy 2015   hands, feet and face--[facility]  . Prothrombin gene mutation ([place]) 2018   single gene mutation  . Right knee pain    hx meniscal injury, chondromalacia, OA  . Thyroid nodule    Past Surgical History:  Procedure Laterality Date  .  CHOLECYSTECTOMY  1990's  . DILITATION & CURRETTAGE/HYSTROSCOPY WITH NOVASURE ABLATION N/A 05/30/2014   Procedure: DILATATION & CURETTAGE/HYSTEROSCOPY WITH attempted [place] and with IUD removal ;  Surgeon: Jamey Reas de Berton Lan, MD;  Location: [facility];  Service: Gynecology;  Laterality: N/A;  . GASTRIC ROUX-EN-Y N/A 11/02/2013   Procedure: LAPAROSCOPIC ROUX-EN-Y GASTRIC BYPASS WITH UPPER ENDOSCOPY;  Surgeon: Gayland Curry, MD;  Location: [facility];  Service: General;  Laterality: N/A;  . HAND SURGERY Left    X 3 - spider bite and MRSA infection  . HYSTEROSCOPY  WITH RESECTOSCOPE N/A 05/30/2014   Procedure: HYSTEROSCOPY WITH RESECTOSCOPE;  Surgeon: Jamey Reas de Berton Lan, MD;  Location: [facility];  Service: Gynecology;  Laterality: N/A;  . KNEE ARTHROSCOPY Right 12/29/2012   Procedure: RIGHT KNEE ARTHROSCOPY  PARTIAL MEDIAL AND LATERAL MENISECTOMY WITH CHONDROPLASTY;  Surgeon: Hessie Dibble, MD;  Location: [facility];  Service: Orthopedics;  Laterality: Right;  . PELVIC LAPAROSCOPY  1990's   d/t endometriosis  . TONSILLECTOMY  as child  . TOTAL KNEE ARTHROPLASTY Left 07/22/2017   Procedure: TOTAL KNEE ARTHROPLASTY;  Surgeon: Melrose Nakayama, MD;  Location: [facility];  Service: Orthopedics;  Laterality: Left;  . TOTAL KNEE ARTHROPLASTY Right 05/26/2018   Procedure: RIGHT TOTAL KNEE ARTHROPLASTY;  Surgeon: Melrose Nakayama, MD;  Location: [facility];  Service: Orthopedics;  Laterality: Right;  . WRIST SURGERY Right 2005   Allergies  Allergen Reactions  . Bactrim [Sulfamethoxazole-Trimethoprim] Other (See Comments)    HALLUCINATIONS, FEVER, CHILLS  . Ibuprofen Other (See Comments)    Had gastric bypass. Told not to take NSAIDs.  . Lamotrigine Hives  . Sulfa Antibiotics Other (See Comments)    HALLUCINATIONS, FEVER, CHILLS     Current Meds  Medication Sig  . Acetaminophen (ACETAMINOPHEN EXTRA STRENGTH) 167 MG/5ML LIQD Take 500 mg by mouth 2 (two) times daily as needed (for headache or pain).  . Acetaminophen (TYLENOL PO) Take 1 capsule by mouth every 8 (eight) hours.  . DULoxetine (CYMBALTA) 60 MG capsule Take 120 mg by mouth every morning.   . traZODone (DESYREL) 100 MG tablet Take 1 tablet by mouth at bedtime as needed.  . [DISCONTINUED] Melatonin 1 MG SUBL Place 3 mg under the tongue at bedtime.  . [DISCONTINUED] UBIQUINOL PO Take by mouth.  . [DISCONTINUED] Vitamin D, Ergocalciferol, (DRISDOL) 50000 units CAPS capsule Take 1 capsule (50,000 Units total) by mouth every 7 (seven) days.   Social History   Tobacco Use  .  Smoking status: Never Smoker  . Smokeless tobacco: Never Used  Substance Use Topics  . Alcohol use: Yes    Alcohol/week: 2.0 standard drinks    Types: 2 Glasses of wine per week    Comment: 2-4 glasses of wine a few times per month   Family History  Problem Relation Age of Onset  . Diabetes Mother        Living  . Kidney disease Mother   . Hypertension Mother   . Breast cancer Mother 7       dx'd with breast ca x2; 2nd around 44; 3rd 60's  . Diabetes Maternal Grandmother 108       dec  . Breast cancer Maternal Grandmother        dx in her 41s  . Arthritis Father   . Healthy Sister   . High blood pressure Sister   . Diabetes Maternal Grandfather   . Stroke Maternal Grandfather   .  Other Paternal Grandmother        died in childbirth  . Other Paternal Grandfather        unknown  . Healthy Daughter   . Breast cancer Other        2 great aunts  . Thyroid disease Neg Hx      Review of Systems  Constitutional: Positive for fatigue. Negative for chills and fever.  Respiratory: Negative for cough, chest tightness, shortness of breath and wheezing.   Cardiovascular: Negative for chest pain, palpitations and leg swelling.  Neurological: Positive for numbness and headaches.  Psychiatric/Behavioral:       Depression - stable. Fluctuates.     Objective:  There were no vitals taken for this visit.      BP Readings from Last 3 Encounters:  10/05/18 124/72  10/01/18 122/74  09/10/18 120/82   Wt Readings from Last 3 Encounters:  10/05/18 248 lb (112.5 kg)  10/01/18 246 lb 9.6 oz (111.9 kg)  09/10/18 250 lb 12.8 oz (113.8 kg)    EXAM:  GENERAL: alert, oriented, appears well and in no acute distress  HEENT: atraumatic, conjunctiva clear, no obvious abnormalities on inspection of external nose and ears  NECK: normal movements of the head and neck  LUNGS: on inspection no signs of respiratory distress, breathing rate appears normal, no obvious gross SOB, gasping or  wheezing  CV: no obvious cyanosis  MS: moves all visible extremities without noticeable abnormality  PSYCH/NEURO: pleasant and cooperative, no obvious depression or anxiety, speech and thought processing grossly intact  SKIN: no facial/skin abnormality noted.   Assessment/Plan  1. Mood disorder ([place]) Has been stable.  See psychiatry.  Continue Cymbalta.  2. Numbness and tingling of both lower extremities This is been going on for years.  She is under evaluation with specialist at [facility].  I did add on blood work that was recommended by them for her to complete. - Ceruloplasmin; Future - Zinc; Future - TSH; Future  3. Hyperlipidemia, unspecified hyperlipidemia type Diet controlled.  Will recheck for stability.  4. S/P gastric bypass With bypass history, may have absorption or nutritional deficiencies.  We will continue to monitor.  5. Abdominal pain, unspecified abdominal location Patient states that urine testing was suggested by specialist as they had concerns for porphyria.  I have added this to her blood work. - Porphobilinogen, random urine; Future  6. B12 deficiency  - Vitamin B12; Future  7. Vitamin D deficiency  - VITAMIN D 25 Hydroxy (Vit-D Deficiency, Fractures); Future  8. Impaired fasting glucose We will recheck for stability. - Hemoglobin A1c; Future  9.  Migraine: I did encourage her to consider trying medications to help with this.  When she gets a migraine it lasts for entire day or sometimes longer, and she does not function well with this.  We discussed that there are multiple medications for prevention as well as aborting medications.   Return pending bloodwork.   I discussed the assessment and treatment plan with the patient. The patient was provided an opportunity to ask questions and all were answered. The patient agreed with the plan and demonstrated an understanding of the instructions.   The patient was advised to call back or seek an in-person  evaluation if the symptoms worsen or if the condition fails to improve as anticipated.  I provided 32 minutes of non-face-to-face time during this encounter.   Micheline Rough, MD

## 2019-03-16 ENCOUNTER — Telehealth: Payer: Self-pay | Admitting: *Deleted

## 2019-03-16 NOTE — Telephone Encounter (Signed)
-----   Message from Caren Macadam, MD sent at 03/13/2019  8:18 AM EDT ----- Please help patient schedule bloodwork. She is getting some bloodwork that needs to happen when she is symptomatic (special lab suggested by specialist) with her abdominal pain. Not sure if she can just call in to get lab when this occurs? Or if she feels it happens enough/happened recently enough to schedule actual date now. Follow up can be scheduled once results back

## 2019-03-16 NOTE — Telephone Encounter (Signed)
Lab appt scheduled for 8/7 at 1:40pm.

## 2019-03-19 ENCOUNTER — Other Ambulatory Visit: Payer: Self-pay

## 2019-03-19 ENCOUNTER — Other Ambulatory Visit (INDEPENDENT_AMBULATORY_CARE_PROVIDER_SITE_OTHER): Payer: BC Managed Care – PPO

## 2019-03-19 DIAGNOSIS — R7301 Impaired fasting glucose: Secondary | ICD-10-CM | POA: Diagnosis not present

## 2019-03-19 DIAGNOSIS — E559 Vitamin D deficiency, unspecified: Secondary | ICD-10-CM | POA: Diagnosis not present

## 2019-03-19 DIAGNOSIS — E538 Deficiency of other specified B group vitamins: Secondary | ICD-10-CM

## 2019-03-19 DIAGNOSIS — R109 Unspecified abdominal pain: Secondary | ICD-10-CM

## 2019-03-19 DIAGNOSIS — R200 Anesthesia of skin: Secondary | ICD-10-CM | POA: Diagnosis not present

## 2019-03-19 DIAGNOSIS — R202 Paresthesia of skin: Secondary | ICD-10-CM

## 2019-03-19 LAB — TSH: TSH: 0.95 u[IU]/mL (ref 0.35–4.50)

## 2019-03-19 LAB — VITAMIN D 25 HYDROXY (VIT D DEFICIENCY, FRACTURES): VITD: 25.85 ng/mL — ABNORMAL LOW (ref 30.00–100.00)

## 2019-03-19 LAB — VITAMIN B12: Vitamin B-12: 213 pg/mL (ref 211–911)

## 2019-03-19 LAB — HEMOGLOBIN A1C: Hgb A1c MFr Bld: 6 % (ref 4.6–6.5)

## 2019-03-22 LAB — CERULOPLASMIN: Ceruloplasmin: 35 mg/dL (ref 18–53)

## 2019-03-22 LAB — ZINC: Zinc: 71 ug/dL (ref 60–130)

## 2019-03-23 LAB — SPECIMEN STATUS REPORT

## 2019-03-23 LAB — PORPHOBILINOGEN, RANDOM URINE

## 2019-03-24 ENCOUNTER — Telehealth: Payer: Self-pay | Admitting: Obstetrics and Gynecology

## 2019-03-24 ENCOUNTER — Other Ambulatory Visit: Payer: Self-pay | Admitting: Obstetrics and Gynecology

## 2019-03-24 ENCOUNTER — Encounter: Payer: Self-pay | Admitting: Obstetrics and Gynecology

## 2019-03-24 DIAGNOSIS — Z1231 Encounter for screening mammogram for malignant neoplasm of breast: Secondary | ICD-10-CM

## 2019-03-24 NOTE — Telephone Encounter (Signed)
Patient sent the following message through [place]. Routing to triage to assist patient with request.  Hello,  I finally have another menstrual cycle and trying for 2 days to get my breast mri scheduled - now they tell me I need to have a mammogram first at the breast center. Which they cant schedule until sept 24. Is there anyway around this? Im not crazy about waiting another indefinite number of months after a mammogram to finally have another cycle and get this mri.  Thanks,  [name]

## 2019-03-25 ENCOUNTER — Other Ambulatory Visit: Payer: Self-pay

## 2019-03-25 ENCOUNTER — Ambulatory Visit
Admission: RE | Admit: 2019-03-25 | Discharge: 2019-03-25 | Disposition: A | Discharge status: Home / Self Care | Payer: BC Managed Care – PPO | Source: Ambulatory Visit | Attending: Obstetrics and Gynecology | Admitting: Obstetrics and Gynecology

## 2019-03-25 DIAGNOSIS — Z1231 Encounter for screening mammogram for malignant neoplasm of breast: Secondary | ICD-10-CM | POA: Diagnosis not present

## 2019-03-25 NOTE — Telephone Encounter (Signed)
Last screening MMG 01/07/18  Call returned to patient, advised screening MMG will need to be updated prior to proceeding with screening breast MRI. Patient is scheduled for screening MMG on 05/06/19. Advised patient I can call [facility] and try to reschedule screening MMG to earlier date and return call, patient agreeable.    Call placed to [facility], spoke with [facility]. Screening MMG rescheduled to today at 3:50pm, arriving at 3:30pm. Advised patient once MMG is completed contact [facility] directly to schedule breast MRI. Patient verbalizes understanding and thankful for return call.   Routing to provider for final review. Patient is agreeable to disposition. Will close encounter.

## 2019-04-08 ENCOUNTER — Telehealth: Payer: Self-pay | Admitting: Obstetrics and Gynecology

## 2019-04-08 NOTE — Telephone Encounter (Signed)
See previous phone notes. Patient has completed mammogram on 03/25/2019, but recommended breast MRI has not been completed. Spoke with patient, she advises she does plan to have MRI and states she will call them to schedule, when her next cycle starts. Will close encunter  Forwarding to Dr Quincy Simmonds for reivew  cc: Glorianne Manchester, RN

## 2019-05-06 ENCOUNTER — Ambulatory Visit: Payer: BC Managed Care – PPO

## 2019-05-06 DIAGNOSIS — F410 Panic disorder [episodic paroxysmal anxiety] without agoraphobia: Secondary | ICD-10-CM | POA: Diagnosis not present

## 2019-05-06 DIAGNOSIS — F3342 Major depressive disorder, recurrent, in full remission: Secondary | ICD-10-CM | POA: Diagnosis not present

## 2019-07-05 ENCOUNTER — Encounter (HOSPITAL_COMMUNITY): Payer: Self-pay | Admitting: Emergency Medicine

## 2019-07-05 ENCOUNTER — Emergency Department (HOSPITAL_BASED_OUTPATIENT_CLINIC_OR_DEPARTMENT_OTHER): Payer: BC Managed Care – PPO

## 2019-07-05 ENCOUNTER — Emergency Department (HOSPITAL_COMMUNITY): Payer: BC Managed Care – PPO

## 2019-07-05 ENCOUNTER — Ambulatory Visit: Payer: Self-pay | Admitting: *Deleted

## 2019-07-05 ENCOUNTER — Emergency Department (HOSPITAL_COMMUNITY)
Admission: EM | Admit: 2019-07-05 | Discharge: 2019-07-05 | Disposition: A | Discharge status: Home / Self Care | Payer: BC Managed Care – PPO | Attending: Emergency Medicine | Admitting: Emergency Medicine

## 2019-07-05 ENCOUNTER — Other Ambulatory Visit: Payer: Self-pay

## 2019-07-05 DIAGNOSIS — Z79899 Other long term (current) drug therapy: Secondary | ICD-10-CM | POA: Diagnosis not present

## 2019-07-05 DIAGNOSIS — R52 Pain, unspecified: Secondary | ICD-10-CM

## 2019-07-05 DIAGNOSIS — E119 Type 2 diabetes mellitus without complications: Secondary | ICD-10-CM | POA: Insufficient documentation

## 2019-07-05 DIAGNOSIS — R079 Chest pain, unspecified: Secondary | ICD-10-CM | POA: Insufficient documentation

## 2019-07-05 DIAGNOSIS — R0789 Other chest pain: Secondary | ICD-10-CM | POA: Diagnosis not present

## 2019-07-05 DIAGNOSIS — R0602 Shortness of breath: Secondary | ICD-10-CM | POA: Diagnosis not present

## 2019-07-05 DIAGNOSIS — R7989 Other specified abnormal findings of blood chemistry: Secondary | ICD-10-CM | POA: Diagnosis not present

## 2019-07-05 DIAGNOSIS — M79605 Pain in left leg: Secondary | ICD-10-CM | POA: Diagnosis not present

## 2019-07-05 LAB — CBC
HCT: 42.4 % (ref 36.0–46.0)
Hemoglobin: 13.7 g/dL (ref 12.0–15.0)
MCH: 30.4 pg (ref 26.0–34.0)
MCHC: 32.3 g/dL (ref 30.0–36.0)
MCV: 94.2 fL (ref 80.0–100.0)
Platelets: 350 10*3/uL (ref 150–400)
RBC: 4.5 MIL/uL (ref 3.87–5.11)
RDW: 13.5 % (ref 11.5–15.5)
WBC: 6.7 10*3/uL (ref 4.0–10.5)
nRBC: 0 % (ref 0.0–0.2)

## 2019-07-05 LAB — BASIC METABOLIC PANEL
Anion gap: 11 (ref 5–15)
BUN: 12 mg/dL (ref 6–20)
CO2: 24 mmol/L (ref 22–32)
Calcium: 9 mg/dL (ref 8.9–10.3)
Chloride: 103 mmol/L (ref 98–111)
Creatinine, Ser: 0.91 mg/dL (ref 0.44–1.00)
GFR calc Af Amer: >60 mL/min (ref >60)
GFR calc non Af Amer: >60 mL/min (ref >60)
Glucose, Bld: 191 mg/dL — ABNORMAL HIGH (ref 70–99)
Potassium: 4.1 mmol/L (ref 3.5–5.1)
Sodium: 138 mmol/L (ref 135–145)

## 2019-07-05 LAB — TROPONIN I (HIGH SENSITIVITY)
Troponin I (High Sensitivity): <2 ng/L (ref <18)
Troponin I (High Sensitivity): <2 ng/L (ref <18)

## 2019-07-05 LAB — I-STAT BETA HCG BLOOD, ED (MC, WL, AP ONLY): I-stat hCG, quantitative: <5 m[IU]/mL (ref <5)

## 2019-07-05 LAB — D-DIMER, QUANTITATIVE: D-Dimer, Quant: 1.19 ug/mL-FEU — ABNORMAL HIGH (ref 0.00–0.50)

## 2019-07-05 MED ORDER — IOHEXOL 350 MG/ML SOLN
100.0000 mL | Freq: Once | INTRAVENOUS | Status: AC | PRN
  Administered 2019-07-05: 53 mL via INTRAVENOUS

## 2019-07-05 NOTE — ED Triage Notes (Signed)
Pt has hx of DVT and states she felt like a blood clot moved from her left leg to her chest. Started having chest heaviness this morning. Denies any SOB. Not on any blood thinners.

## 2019-07-05 NOTE — ED Provider Notes (Signed)
[facility] DEPART[facility] Provider Note   CSN: [id number] Arrival date & time: 07/05/19  [id number]     History   Chief Complaint Chief Complaint  Patient presents with   Chest Pain    HPI [name] is a 48 y.o. female.     HPI Patient with left leg pain that then turned and chest pain.  States for the last few days she felt as if she is having some pain in her calf.  States the pain then moved up to her knee and then to her thigh.  States she has had DVTs before and it felt like this.  Has a prothrombin mutation. States today she began to have some mild chest pain.  No shortness of breath.  No fevers.  No cough.  No known sick contacts.  She is not on anticoagulation chronically.  Had a provoked DVT after surgery years ago. She states she was at her desk for around 80 hours this last week. Past Medical History:  Diagnosis Date   Anxiety and depression    Arthritis    Complication of anesthesia    has been hard to wake up post-op   Dental crowns present    Depression    Diabetes ([place])    resolved with gastric bypass, no meds   DVT of lower extremity (deep venous thrombosis) ([place]) 2012   LLE   Dysmenorrhea    hx endometriosis and fibroid   Family history of breast cancer    Headache(784.0)    tension or sinus   History of MRSA infection prior to 2012   lasted 5-6 yr.   Hx of nipple discharge    Bilateral.  Long standing.  Negative work up.    Jaw snapping    states jaw pops   Left foot drop 2016   --[place] [facility]   Neuromuscular disorder ([place])    neuropathy in hands,feet,face   Neuropathy 2015   hands, feet and face--[facility]   Prothrombin gene mutation ([place]) 2018   single gene mutation   Right knee pain    hx meniscal injury, chondromalacia, OA   Thyroid nodule     Patient Active Problem List   Diagnosis Date Noted   Primary osteoarthritis of right knee 05/26/2018   Bilateral nipple discharge  09/19/2017   Primary localized osteoarthritis of left knee 07/22/2017   Primary osteoarthritis of left knee 07/22/2017   IDA (iron deficiency anemia) 05/20/2017   Effusion of left knee 05/17/2017   Degenerative arthritis of right knee 02/13/2017   Degenerative arthritis of left knee 02/13/2017   Numbness and tingling of both lower extremities 10/18/2016   Genetic testing 10/18/2016   Family history of breast cancer    Multinodular goiter 04/24/2015   Mood disorder ([place]) 04/12/2015   Peroneal mononeuropathy 08/22/2014   Left foot drop 07/21/2014   Menorrhagia with irregular cycle 04/29/2014   S/P gastric bypass 11/02/2013   Hyperlipidemia 10/08/2013   Primary localized osteoarthrosis, lower leg 07/06/2013   Obesity, BMI unknown 04/23/2013   IUD contraception - inserted summer 2013 04/09/2013    Past Surgical History:  Procedure Laterality Date   CHOLECYSTECTOMY  1990's   DILITATION & CURRETTAGE/HYSTROSCOPY WITH NOVASURE ABLATION N/A 05/30/2014   Procedure: DILATATION & CURETTAGE/HYSTEROSCOPY WITH attempted [place] and with IUD removal ;  Surgeon: Jamey Reas de Berton Lan, MD;  Location: [facility];  Service: Gynecology;  Laterality: N/A;   GASTRIC ROUX-EN-Y N/A 11/02/2013   Procedure:  LAPAROSCOPIC ROUX-EN-Y GASTRIC BYPASS WITH UPPER ENDOSCOPY;  Surgeon: Gayland Curry, MD;  Location: [facility];  Service: General;  Laterality: N/A;   HAND SURGERY Left    X 3 - spider bite and MRSA infection   HYSTEROSCOPY WITH RESECTOSCOPE N/A 05/30/2014   Procedure: HYSTEROSCOPY WITH RESECTOSCOPE;  Surgeon: Jamey Reas de Berton Lan, MD;  Location: [facility];  Service: Gynecology;  Laterality: N/A;   KNEE ARTHROSCOPY Right 12/29/2012   Procedure: RIGHT KNEE ARTHROSCOPY  PARTIAL MEDIAL AND LATERAL MENISECTOMY WITH CHONDROPLASTY;  Surgeon: Hessie Dibble, MD;  Location: [facility];  Service: Orthopedics;  Laterality: Right;   PELVIC  LAPAROSCOPY  1990's   d/t endometriosis   TONSILLECTOMY  as child   TOTAL KNEE ARTHROPLASTY Left 07/22/2017   Procedure: TOTAL KNEE ARTHROPLASTY;  Surgeon: Melrose Nakayama, MD;  Location: [facility];  Service: Orthopedics;  Laterality: Left;   TOTAL KNEE ARTHROPLASTY Right 05/26/2018   Procedure: RIGHT TOTAL KNEE ARTHROPLASTY;  Surgeon: Melrose Nakayama, MD;  Location: [facility];  Service: Orthopedics;  Laterality: Right;   WRIST SURGERY Right 2005     OB History    Gravida  3   Para  1   Term  1   Preterm      AB  2   Living  1     SAB  2   TAB      Ectopic      Multiple      Live Births               Home Medications    Prior to Admission medications   Medication Sig Start Date End Date Taking? Authorizing Provider  Acetaminophen (ACETAMINOPHEN EXTRA STRENGTH) 167 MG/5ML LIQD Take 500 mg by mouth 2 (two) times daily as needed (for headache or pain).    [provider]  Acetaminophen (TYLENOL PO) Take 1 capsule by mouth every 8 (eight) hours.    [provider]  DULoxetine (CYMBALTA) 60 MG capsule Take 120 mg by mouth every morning.  08/28/12   [provider]  traZODone (DESYREL) 100 MG tablet Take 1 tablet by mouth at bedtime as needed. 10/01/18   [provider]    Family History Family History  Problem Relation Age of Onset   Diabetes Mother        Living   Kidney disease Mother    Hypertension Mother    Breast cancer Mother 53       dx'd with breast ca x2; 2nd around 71; 42rd 60's   Diabetes Maternal Grandmother 46       dec   Breast cancer Maternal Grandmother        dx in her 24s   Arthritis Father    Healthy Sister    High blood pressure Sister    Diabetes Maternal Grandfather    Stroke Maternal Grandfather    Other Paternal Grandmother        died in childbirth   Other Paternal Grandfather        unknown   Healthy Daughter    Breast cancer Other        2 great aunts   Thyroid disease Neg Hx      Social History Social History   Tobacco Use   Smoking status: Never Smoker   Smokeless tobacco: Never Used  Substance Use Topics   Alcohol use: Yes    Alcohol/week: 2.0 standard drinks    Types: 2 Glasses of wine  per week    Comment: 2-4 glasses of wine a few times per month   Drug use: No     Allergies   Bactrim [sulfamethoxazole-trimethoprim], Ibuprofen, Lamotrigine, and Sulfa antibiotics   Review of Systems Review of Systems  Constitutional: Negative for appetite change.  Respiratory: Negative for shortness of breath.   Cardiovascular: Positive for chest pain. Negative for leg swelling.  Gastrointestinal: Negative for abdominal pain.  Genitourinary: Negative for flank pain.  Musculoskeletal: Negative for back pain.  Neurological: Negative for weakness.  Psychiatric/Behavioral: Negative for confusion.     Physical Exam Updated Vital Signs BP (!) 146/76    Pulse 75    Temp 98.4 F (36.9 C) (Oral)    Resp 20    Ht 5\' 8"  (1.727 m)    Wt 104.3 kg    SpO2 98%    BMI 34.97 kg/m   Physical Exam Vitals signs and nursing note reviewed.  Constitutional:      Appearance: She is well-developed.  HENT:     Head: Normocephalic.  Eyes:     Extraocular Movements: Extraocular movements intact.  Neck:     Musculoskeletal: Neck supple.  Cardiovascular:     Rate and Rhythm: Regular rhythm.     Heart sounds: Normal heart sounds.  Pulmonary:     Breath sounds: No decreased breath sounds, wheezing, rhonchi or rales.  Abdominal:     Tenderness: There is no abdominal tenderness.  Musculoskeletal:     Right lower leg: She exhibits no tenderness. No edema.     Left lower leg: She exhibits no tenderness. No edema.  Skin:    General: Skin is warm.  Neurological:     Mental Status: She is alert.      ED Treatments / Results  Labs (all labs ordered are listed, but only abnormal results are displayed) Labs Reviewed  BASIC METABOLIC PANEL - Abnormal; Notable for the  following components:      Result Value   Glucose, Bld 191 (*)    All other components within normal limits  D-DIMER, QUANTITATIVE (NOT AT [facility]) - Abnormal; Notable for the following components:   D-Dimer, Quant 1.19 (*)    All other components within normal limits  CBC  I-STAT BETA HCG BLOOD, ED ([facility], [facility], [facility] ONLY)  TROPONIN I (HIGH SENSITIVITY)  TROPONIN I (HIGH SENSITIVITY)    EKG None  Radiology Dg Chest 2 View  Result Date: 07/05/2019 CLINICAL DATA:  Chest pain and shortness of breath EXAM: CHEST - 2 VIEW COMPARISON:  February 05, 2019 FINDINGS: Lungs are clear. Heart size and pulmonary vascularity are normal. No adenopathy. No bone lesions. No pneumothorax. IMPRESSION: No edema or consolidation. Electronically Signed   By: Lowella Grip III M.D.   On: 07/05/2019 09:42   Ct Angio Chest Pe W And/or Wo Contrast  Result Date: 07/05/2019 CLINICAL DATA:  Onset chest heaviness today in a patient with a history of DVT. Elevated D-dimer. EXAM: CT ANGIOGRAPHY CHEST WITH CONTRAST TECHNIQUE: Multidetector CT imaging of the chest was performed using the standard protocol during bolus administration of intravenous contrast. Multiplanar CT image reconstructions and MIPs were obtained to evaluate the vascular anatomy. CONTRAST:  53 mL OMNIPAQUE IOHEXOL 350 MG/ML SOLN COMPARISON:  PA and lateral chest earlier today. FINDINGS: Cardiovascular: Satisfactory opacification of the pulmonary arteries to the segmental level. No evidence of pulmonary embolism. Normal heart size. No pericardial effusion. Mediastinum/Nodes: No enlarged mediastinal, hilar, or axillary lymph nodes. Thyroid gland, trachea, and esophagus demonstrate no  significant findings. Lungs/Pleura: Lungs are clear. No pleural effusion or pneumothorax. Upper Abdomen: No acute finding. The patient is status post gastric bypass and cholecystectomy. Musculoskeletal: No acute or focal abnormality. Review of the MIP images confirms the above findings.  IMPRESSION: 1. Negative for pulmonary embolus. No acute disease. 2. Status post gastric bypass and cholecystectomy. Electronically Signed   By: Inge Rise M.D.   On: 07/05/2019 13:20   Vas [place] Lower Extremity Venous (dvt) (only [facility])  Result Date: 07/05/2019  Lower Venous Study Indications: Pain.  Comparison Study: 06-08-18 - LEV - negative Performing Technologist: Velva Harman Sturdivant RDMS, RVT  Examination Guidelines: A complete evaluation includes B-mode imaging, spectral Doppler, color Doppler, and power Doppler as needed of all accessible portions of each vessel. Bilateral testing is considered an integral part of a complete examination. Limited examinations for reoccurring indications may be performed as noted.  +-----+---------------+---------+-----------+----------+--------------+  RIGHT Compressibility Phasicity Spontaneity Properties Thrombus Aging  +-----+---------------+---------+-----------+----------+--------------+  CFV   Full            Yes       Yes                                    +-----+---------------+---------+-----------+----------+--------------+  SFJ   Full                                                             +-----+---------------+---------+-----------+----------+--------------+   +---------+---------------+---------+-----------+----------+--------------+  LEFT      Compressibility Phasicity Spontaneity Properties Thrombus Aging  +---------+---------------+---------+-----------+----------+--------------+  CFV       Full            Yes       Yes                                    +---------+---------------+---------+-----------+----------+--------------+  SFJ       Full                                                             +---------+---------------+---------+-----------+----------+--------------+  FV Prox   Full                                                             +---------+---------------+---------+-----------+----------+--------------+  FV Mid    Full                                                              +---------+---------------+---------+-----------+----------+--------------+  FV Distal Full                                                             +---------+---------------+---------+-----------+----------+--------------+  PFV       Full                                                             +---------+---------------+---------+-----------+----------+--------------+  POP       Full            Yes       Yes                                    +---------+---------------+---------+-----------+----------+--------------+  PTV       Full                                                             +---------+---------------+---------+-----------+----------+--------------+  PERO      Full                                                             +---------+---------------+---------+-----------+----------+--------------+  GSV       Full                                                             +---------+---------------+---------+-----------+----------+--------------+     Summary: Right: No evidence of common femoral vein obstruction. Left: There is no evidence of deep vein thrombosis in the lower extremity.There is no evidence of superficial venous thrombosis. No cystic structure found in the popliteal fossa.  *See table(s) above for measurements and observations. Electronically signed by Curt Jews MD on 07/05/2019 at 1:32:22 PM.    Final     Procedures Procedures (including critical care time)  Medications Ordered in ED Medications  iohexol (OMNIPAQUE) 350 MG/ML injection 100 mL (53 mLs Intravenous Contrast Given 07/05/19 1308)     Initial Impression / Assessment and Plan / ED Course  I have reviewed the triage vital signs and the nursing notes.  Pertinent labs & imaging results that were available during my care of the patient were reviewed by me and considered in my medical decision making (see chart for details).       Patient with  left lower extremity pain and shortness of breath.  Worry about PE since she is hypercoagulable genetically.  Ultrasound reassuring and CT chest also reassuring.  Patient's D-dimer had been positive.  Does not appear to be cardiac cause.  No fevers.  Will discharge home.  Final Clinical Impressions(s) / ED Diagnoses   Final diagnoses:  Nonspecific chest pain  Pain of left lower extremity    ED Discharge Orders    None       Davonna Belling, MD 07/05/19 1411

## 2019-07-05 NOTE — Progress Notes (Signed)
Left lower ext venous  has been completed. Refer to [facility] under chart review to view preliminary results.   07/05/2019  11:34 AM Tressy Kunzman, Bonnye Fava

## 2019-07-05 NOTE — Telephone Encounter (Signed)
Patient felt like she has clot in leg- Sunday she felt that it moved into thigh area and now-she is feeling soreness in leg- chest is heavy. Patient states she has genetic disorder that predisposes her to clots and she has had them in the past.  Reason for Disposition . Chest pain  Answer Assessment - Initial Assessment Questions 1. ONSET: "When did the pain start?"      Late Friday 2. LOCATION: "Where is the pain located?"      Left calve 3. PAIN: "How bad is the pain?"    (Scale 1-10; or mild, moderate, severe)   -  MILD (1-3): doesn't interfere with normal activities    -  MODERATE (4-7): interferes with normal activities (e.g., work or school) or awakens from sleep, limping    -  SEVERE (8-10): excruciating pain, unable to do any normal activities, unable to walk     Sore- leg 3 in chest heavy 4. WORK OR EXERCISE: "Has there been any recent work or exercise that involved this part of the body?"      no 5. CAUSE: "What do you think is causing the leg pain?"     DVT- moves 6. OTHER SYMPTOMS: "Do you have any other symptoms?" (e.g., chest pain, back pain, breathing difficulty, swelling, rash, fever, numbness, weakness)     Chest heaviness 7. PREGNANCY: "Is there any chance you are pregnant?" "When was your last menstrual period?"     Not asked  Protocols used: LEG PAIN-A-AH

## 2019-07-05 NOTE — Telephone Encounter (Signed)
Noted. Looks like she is in ER and has already started process of evaluation.

## 2019-07-05 NOTE — ED Notes (Signed)
Pt given dc instructions pt verbalizes understanding.  

## 2019-08-13 DIAGNOSIS — R7989 Other specified abnormal findings of blood chemistry: Secondary | ICD-10-CM

## 2019-08-13 HISTORY — DX: Other specified abnormal findings of blood chemistry: R79.89

## 2019-08-24 ENCOUNTER — Telehealth: Payer: Self-pay | Admitting: Obstetrics and Gynecology

## 2019-08-24 NOTE — Telephone Encounter (Signed)
Left message on voicemail to call and reschedule cancelled appointment. °

## 2019-08-25 ENCOUNTER — Other Ambulatory Visit: Payer: Self-pay | Admitting: Family Medicine

## 2019-08-25 ENCOUNTER — Other Ambulatory Visit: Payer: BC Managed Care – PPO

## 2019-08-25 ENCOUNTER — Encounter: Payer: Self-pay | Admitting: Family Medicine

## 2019-08-25 DIAGNOSIS — R109 Unspecified abdominal pain: Secondary | ICD-10-CM

## 2019-08-25 NOTE — Telephone Encounter (Signed)
Spoke with the pt and scheduled a lab appt for today to arrive at 3:05pm.

## 2019-08-25 NOTE — Telephone Encounter (Signed)
Note received from the lab tech stating the tests needed cannot be done at this office and the pt would need to go to [facility] with the pt and informed her of this and lab orders were faxed to [facility] location) at [phone].

## 2019-08-25 NOTE — Telephone Encounter (Signed)
This is ongoing issue for her; but please make sure lab is ok with running this. I have re-ordered urine studies for her.

## 2019-08-25 NOTE — Addendum Note (Signed)
Addended by: Agnes Lawrence on: 08/25/2019 02:30 PM   Modules accepted: Orders

## 2019-08-26 ENCOUNTER — Encounter: Payer: Self-pay | Admitting: Family Medicine

## 2019-08-28 LAB — URINALYSIS, COMPLETE
Bilirubin, UA: NEGATIVE
Glucose, UA: NEGATIVE
Ketones, UA: NEGATIVE
Leukocytes,UA: NEGATIVE
Nitrite, UA: NEGATIVE
Protein,UA: NEGATIVE
RBC, UA: NEGATIVE
Specific Gravity, UA: 1.02 (ref 1.005–1.030)
Urobilinogen, Ur: 0.2 mg/dL (ref 0.2–1.0)
pH, UA: 5.5 (ref 5.0–7.5)

## 2019-08-28 LAB — MICROSCOPIC EXAMINATION
Bacteria, UA: NONE SEEN
Casts: NONE SEEN /lpf

## 2019-08-28 LAB — PORPHOBILINOGEN, RANDOM URINE: Porphobilinogen, Rand Ur: 1.4 mg/L (ref 0.0–2.0)

## 2019-10-08 ENCOUNTER — Ambulatory Visit: Payer: BLUE CROSS/BLUE SHIELD | Admitting: Obstetrics and Gynecology

## 2019-10-21 NOTE — Progress Notes (Signed)
49 y.o. G48P1021 Married Caucasian female here for annual exam.   Patient skipping cycles, but still has cramps some months and breasts feel enlarged and tender. No menstrual cycle since August, 2020. Did have some hot flashes last summer, but none now.  She has a hx of thyroid nodules and did have a biopsy which was benign.  She has had nipple discharge both breasts, right greater than left for 16 years, greyish/greenish in color.  Prolactin 15.2 on 04/29/14. Negative dx bilateral mammogram in 06/29/2013.   Has known fibroids. 2 small ones - intramural and subserosal. Hx of dysmenorrhea and abnormal uterine bleeding.  EMB 2017 - benign.   PCP: Micheline Rough, Md  Patient's last menstrual period was 03/25/2019 (exact date).           Sexually active: Yes.    The current method of family planning is none--female partner.    Exercising: No.  began walking regularly last week Smoker:  no  Health Maintenance: Pap:  09-19-17 Neg:Neg HR HPV, 04-29-14 Neg:Neg HR HPV, 04-23-13 Neg History of abnormal Pap:  no MMG: 03-25-19 3D/Neg/density B/BiRads1 Colonoscopy: 04/10/17 Normal f/u 10 years BMD:   n/a  Result  n/a TDaP:  2014 Gardasil:   no HIV: 05-19-15 NR Hep C: 05-19-15 Neg Screening Labs:  PCP.    reports that she has never smoked. She has never used smokeless tobacco. She reports current alcohol use of about 4.0 - 6.0 standard drinks of alcohol per week. She reports that she does not use drugs.  Past Medical History:  Diagnosis Date  . Anxiety and depression   . Arthritis   . Complication of anesthesia    has been hard to wake up post-op  . Dental crowns present   . Depression   . Diabetes ([place])    resolved with gastric bypass, no meds  . DVT of lower extremity (deep venous thrombosis) ([place]) 2012   LLE  . Dysmenorrhea    hx endometriosis and fibroid  . Family history of breast cancer   . Headache(784.0)    tension or sinus  . History of MRSA infection prior to 2012    lasted 5-6 yr.  . Hx of nipple discharge    Bilateral.  Long standing.  Negative work up.   . Jaw snapping    states jaw pops  . Left foot drop 2016   --[facility]  . Neuromuscular disorder ([place])    neuropathy in hands,feet,face  . Neuropathy 2015   hands, feet and face--[facility]  . Prothrombin gene mutation ([place]) 2018   single gene mutation  . Right knee pain    hx meniscal injury, chondromalacia, OA  . Thyroid nodule     Past Surgical History:  Procedure Laterality Date  . CHOLECYSTECTOMY  1990's  . DILITATION & CURRETTAGE/HYSTROSCOPY WITH NOVASURE ABLATION N/A 05/30/2014   Procedure: DILATATION & CURETTAGE/HYSTEROSCOPY WITH attempted [place] and with IUD removal ;  Surgeon: Jamey Reas de Berton Lan, MD;  Location: [facility];  Service: Gynecology;  Laterality: N/A;  . GASTRIC ROUX-EN-Y N/A 11/02/2013   Procedure: LAPAROSCOPIC ROUX-EN-Y GASTRIC BYPASS WITH UPPER ENDOSCOPY;  Surgeon: Gayland Curry, MD;  Location: [facility];  Service: General;  Laterality: N/A;  . HAND SURGERY Left    X 3 - spider bite and MRSA infection  . HYSTEROSCOPY WITH RESECTOSCOPE N/A 05/30/2014   Procedure: HYSTEROSCOPY WITH RESECTOSCOPE;  Surgeon: Jamey Reas de Berton Lan, MD;  Location: [facility];  Service:  Gynecology;  Laterality: N/A;  . KNEE ARTHROSCOPY Right 12/29/2012   Procedure: RIGHT KNEE ARTHROSCOPY  PARTIAL MEDIAL AND LATERAL MENISECTOMY WITH CHONDROPLASTY;  Surgeon: Hessie Dibble, MD;  Location: [facility];  Service: Orthopedics;  Laterality: Right;  . PELVIC LAPAROSCOPY  1990's   d/t endometriosis  . TONSILLECTOMY  as child  . TOTAL KNEE ARTHROPLASTY Left 07/22/2017   Procedure: TOTAL KNEE ARTHROPLASTY;  Surgeon: Melrose Nakayama, MD;  Location: [facility];  Service: Orthopedics;  Laterality: Left;  . TOTAL KNEE ARTHROPLASTY Right 05/26/2018   Procedure: RIGHT TOTAL KNEE ARTHROPLASTY;  Surgeon: Melrose Nakayama, MD;  Location: [facility];  Service:  Orthopedics;  Laterality: Right;  . WRIST SURGERY Right 2005    Current Outpatient Medications  Medication Sig Dispense Refill  . Acetaminophen (ACETAMINOPHEN EXTRA STRENGTH) 167 MG/5ML LIQD Take 500 mg by mouth 2 (two) times daily as needed (for headache or pain).    . Acetaminophen (TYLENOL PO) Take 1 capsule by mouth every 8 (eight) hours.    . Calcium Carbonate-Vitamin D (OYSTER SHELL CALCIUM/D) 250-125 MG-UNIT TABS Take 1 tablet by mouth daily.    . DULoxetine (CYMBALTA) 60 MG capsule Take 120 mg by mouth every morning.     . traZODone (DESYREL) 100 MG tablet Take 1 tablet by mouth at bedtime as needed.     No current facility-administered medications for this visit.    Family History  Problem Relation Age of [place]  . Diabetes Mother        Living  . Kidney disease Mother   . Hypertension Mother   . Breast cancer Mother 49       dx'd with breast ca x2; 2nd around 70; 3rd 60's  . Diabetes Maternal Grandmother 84       dec  . Breast cancer Maternal Grandmother        dx in her 49s  . Arthritis Father   . Healthy Sister   . High blood pressure Sister   . Diabetes Maternal Grandfather   . Stroke Maternal Grandfather   . Other Paternal Grandmother        died in childbirth  . Other Paternal Grandfather        unknown  . Healthy Daughter   . Breast cancer Other        2 great aunts  . Thyroid disease Neg Hx     Review of Systems  Constitutional: Positive for unexpected weight change (weight gain--COVID).  All other systems reviewed and are negative.   Exam:   BP 138/72 (Cuff Size: Large)   Pulse 90   Temp (!) 97 F (36.1 C) (Temporal)   Resp 14   Ht 5' 7.5" (1.715 m)   Wt 259 lb 9.6 oz (117.8 kg)   LMP 03/25/2019 (Exact Date)   BMI 40.06 kg/m     General appearance: alert, cooperative and appears stated age Head: normocephalic, without obvious abnormality, atraumatic Neck: no adenopathy, supple, symmetrical, trachea midline and thyroid normal to inspection  and palpation Lungs: clear to auscultation bilaterally Breasts: normal appearance, no masses or tenderness, No nipple retraction or dimpling, No nipple discharge or bleeding, No axillary adenopathy Heart: regular rate and rhythm Abdomen: soft, non-tender; no masses, no organomegaly Extremities: extremities normal, atraumatic, no cyanosis or edema Skin: skin color, texture, turgor normal. No rashes or lesions Lymph nodes: cervical, supraclavicular, and axillary nodes normal. Neurologic: grossly normal  Pelvic: External genitalia:  no lesions  No abnormal inguinal nodes palpated.              Urethra:  normal appearing urethra with no masses, tenderness or lesions              Bartholins and Skenes: normal                 Vagina: normal appearing vagina with normal color and discharge, no lesions              Cervix: no lesions              Pap taken: No. Bimanual Exam:  Uterus:  normal size, contour, position, consistency, mobility, non-tender              Adnexa: no mass, fullness, tenderness              Rectal exam: Yes.   .  Confirms.              Anus:  normal sphincter tone, no lesions  Chaperone was present for exam.  Assessment:   Well woman visit with normal exam. Amenorrhea.  Likely is perimenopausal.  Status post endometrial ablation.  Fibroids.  Small. Prothrombin gene mutation.  Hx DVT. Hx bilateral nipple discharge with negative work up. FH of breast cancer. Increased lifetime risk of breast cancer - 22.1%.  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  Plan: Mammogram screening discussed. Self breast awareness reviewed. Pap and HR HPV as above. Guidelines for Calcium, Vitamin D, regular exercise program including cardiovascular and weight bearing exercise. Check FSH, E2, prolactin, TSH.  If not menopausal, will do Provera 10 mg x 10 days every 3 months.  Breast MRI ordered.  Follow up annually and prn.   After visit summary provided.

## 2019-10-22 ENCOUNTER — Other Ambulatory Visit: Payer: Self-pay

## 2019-10-25 ENCOUNTER — Encounter: Payer: Self-pay | Admitting: Obstetrics and Gynecology

## 2019-10-25 ENCOUNTER — Other Ambulatory Visit: Payer: Self-pay

## 2019-10-25 ENCOUNTER — Ambulatory Visit: Payer: BC Managed Care – PPO | Admitting: Obstetrics and Gynecology

## 2019-10-25 VITALS — BP 138/72 | HR 90 | Temp 97.0°F | Resp 14 | Ht 67.5 in | Wt 259.6 lb

## 2019-10-25 DIAGNOSIS — Z9189 Other specified personal risk factors, not elsewhere classified: Secondary | ICD-10-CM | POA: Diagnosis not present

## 2019-10-25 DIAGNOSIS — N912 Amenorrhea, unspecified: Secondary | ICD-10-CM | POA: Diagnosis not present

## 2019-10-25 DIAGNOSIS — Z01419 Encounter for gynecological examination (general) (routine) without abnormal findings: Secondary | ICD-10-CM | POA: Diagnosis not present

## 2019-10-25 MED ORDER — NYSTATIN 100000 UNIT/GM EX POWD: 1.0000 "application " | Freq: Three times a day (TID) | CUTANEOUS | 2 refills | Status: DC

## 2019-10-25 NOTE — Patient Instructions (Signed)

## 2019-10-26 LAB — PROLACTIN: Prolactin: 30 ng/mL — ABNORMAL HIGH (ref 4.8–23.3)

## 2019-10-26 LAB — ESTRADIOL: Estradiol: 6.2 pg/mL

## 2019-10-26 LAB — TSH: TSH: 1.25 u[IU]/mL (ref 0.450–4.500)

## 2019-10-26 LAB — FOLLICLE STIMULATING HORMONE: FSH: 63.4 m[IU]/mL

## 2019-10-29 ENCOUNTER — Encounter: Payer: Self-pay | Admitting: Obstetrics and Gynecology

## 2019-10-29 NOTE — Addendum Note (Signed)
Addended by: Yisroel Ramming, Dietrich Pates E on: 10/29/2019 04:26 PM   Modules accepted: Orders

## 2019-11-10 ENCOUNTER — Other Ambulatory Visit: Payer: Self-pay

## 2019-11-11 ENCOUNTER — Other Ambulatory Visit (INDEPENDENT_AMBULATORY_CARE_PROVIDER_SITE_OTHER): Payer: BC Managed Care – PPO

## 2019-11-11 DIAGNOSIS — N912 Amenorrhea, unspecified: Secondary | ICD-10-CM | POA: Diagnosis not present

## 2019-11-12 LAB — PROLACTIN: Prolactin: 16.3 ng/mL (ref 4.8–23.3)

## 2019-11-19 IMAGING — US US EXTREM LOW VENOUS*R*
1 series · 13 of 24 positions shown · non-contrast
Comparison: None.

CLINICAL DATA: Right lower extremity pain and edema. History of
right total knee replacement. History of DVT involving the
contralateral left lower extremity. Evaluate for DVT.



[Series 1: us extrem low venous*right* · 0.08mm/px · 13 of 28 slices shown]
[im 1/28]
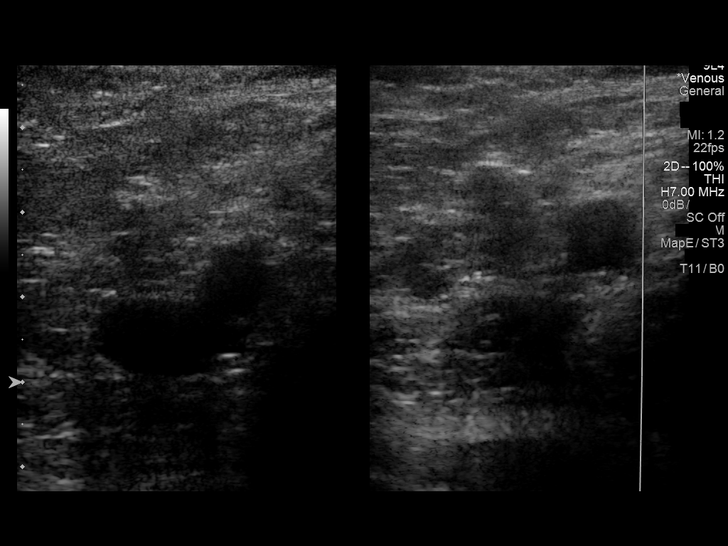
[im 3/28]
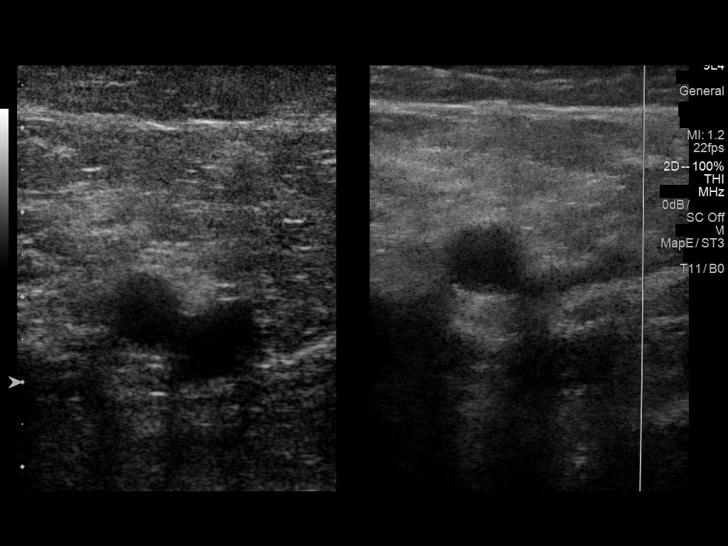
[im 5/28]
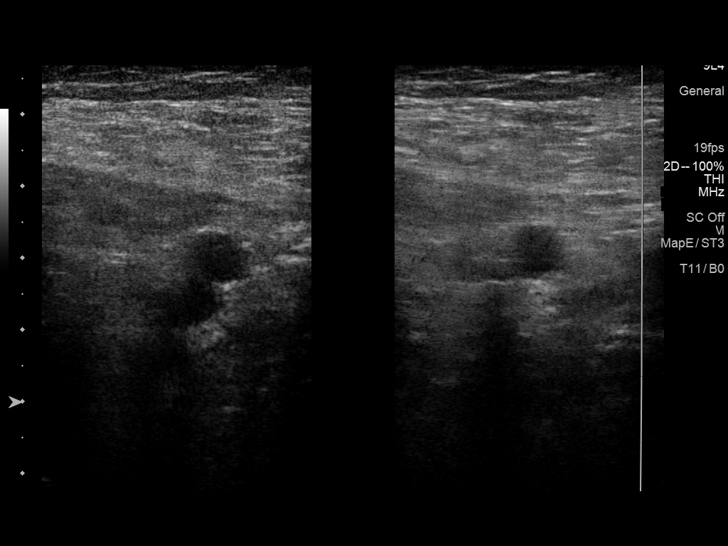
[im 8/28]
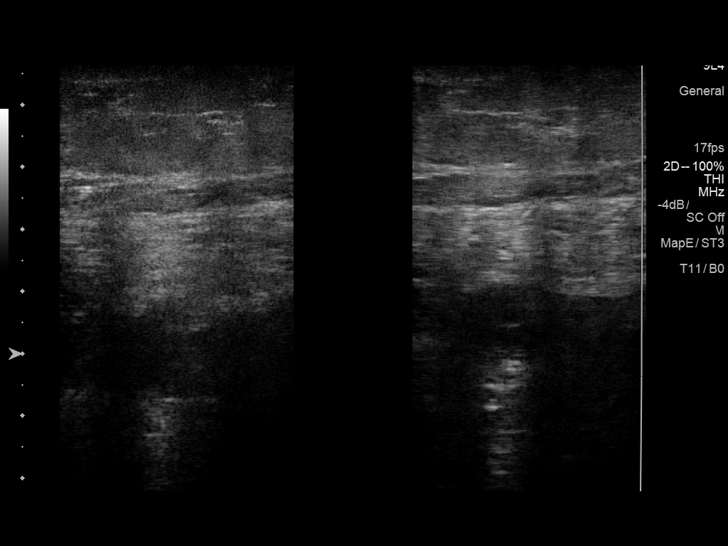
[im 10/28]
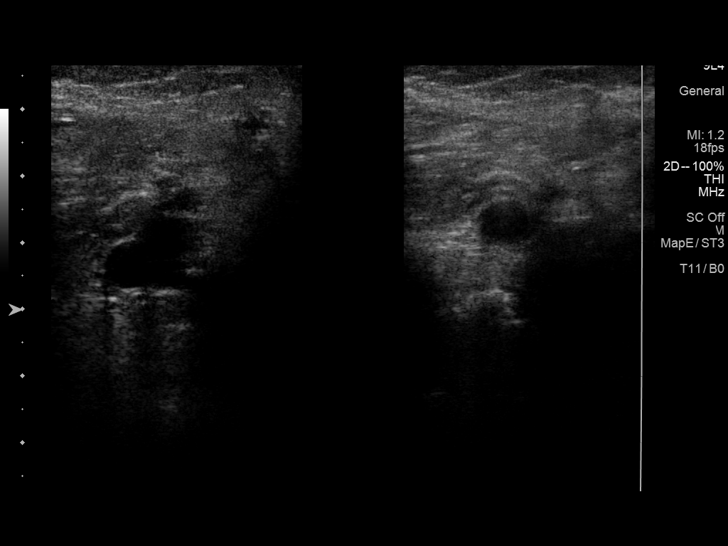
[im 12/28]
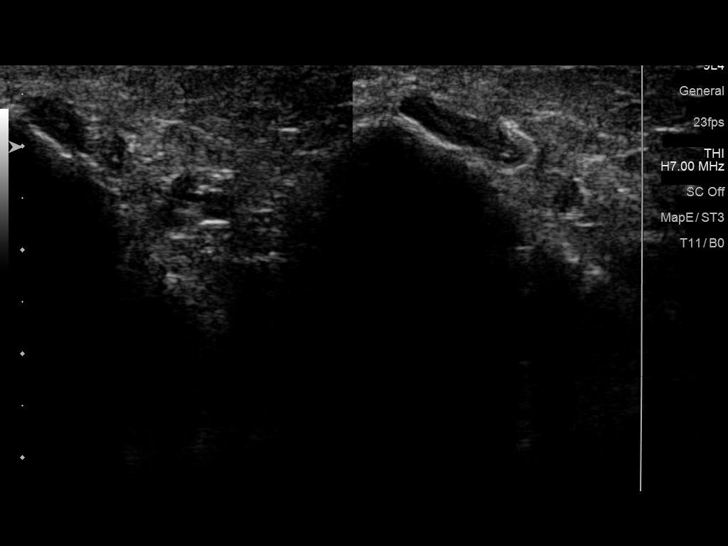
[im 15/28]
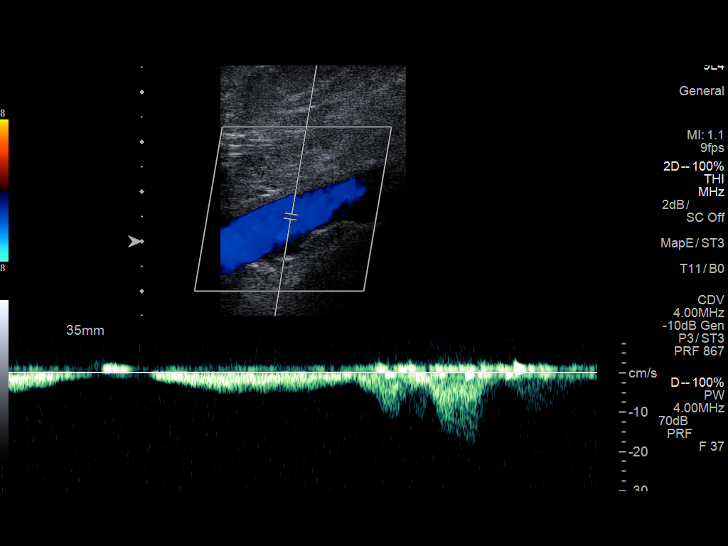
[im 16/28]
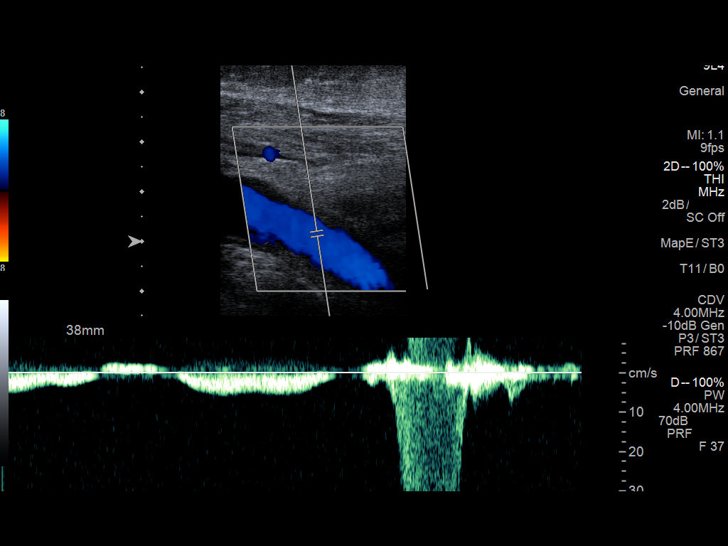
[im 18/28]
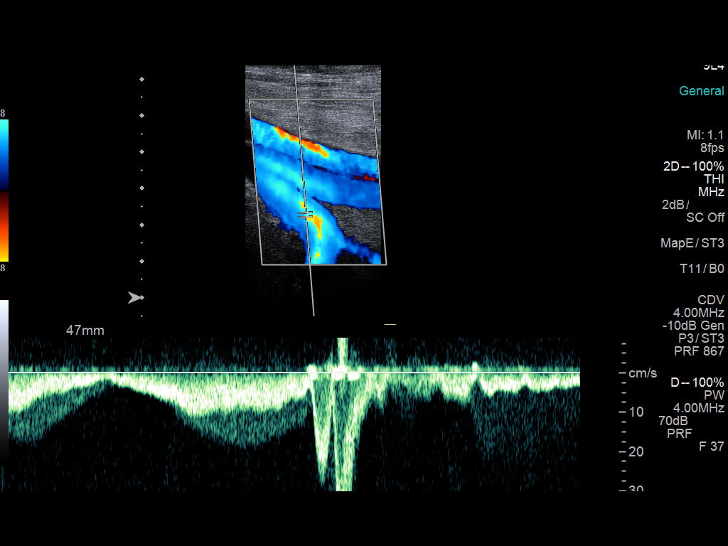
[im 20/28]
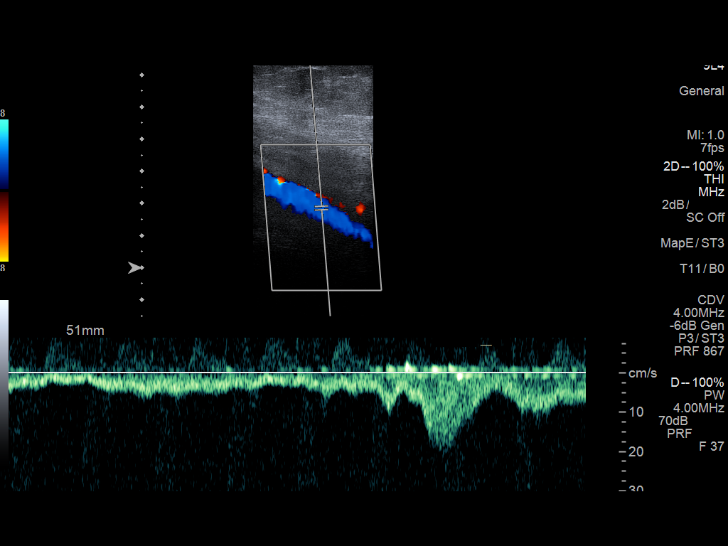
[im 23/28]
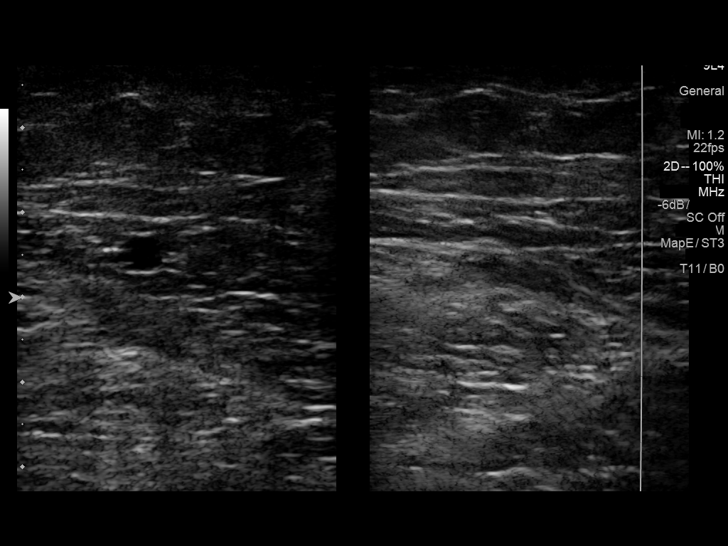
[im 25/28]
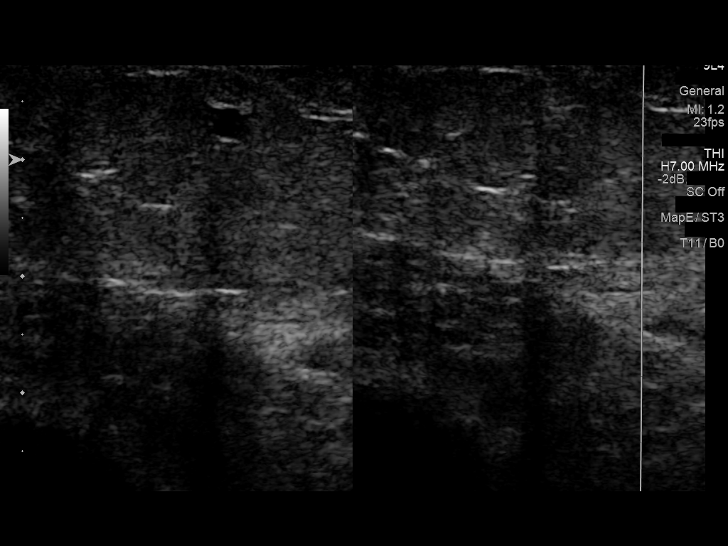
[im 28/28]
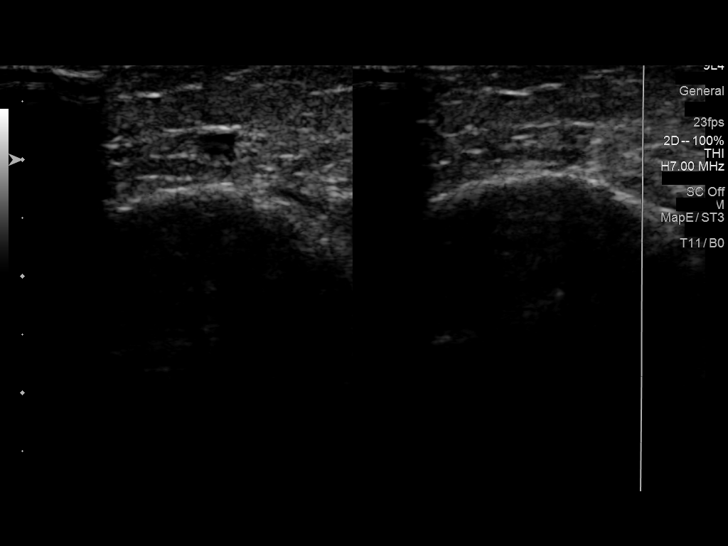

[13 of 24 positions shown; findings below may reference images not displayed]

FINDINGS: Contralateral Common Femoral Vein: Respiratory phasicity is normal
and symmetric with the symptomatic side. No evidence of thrombus.
Normal compressibility.

Common Femoral Vein: No evidence of thrombus. Normal
compressibility, respiratory phasicity and response to augmentation.

Saphenofemoral Junction: No evidence of thrombus. Normal
compressibility and flow on color Doppler imaging.

Profunda Femoral Vein: No evidence of thrombus. Normal
compressibility and flow on color Doppler imaging.

Femoral Vein: No evidence of thrombus. Normal compressibility,
respiratory phasicity and response to augmentation.

Popliteal Vein: No evidence of thrombus. Normal compressibility,
respiratory phasicity and response to augmentation.

Calf Veins: No evidence of thrombus. Normal compressibility and flow
on color Doppler imaging.

Superficial Great Saphenous Vein: No evidence of thrombus. Normal
compressibility.

Venous Reflux:  None.

Other Findings:  None.
IMPRESSION: No evidence DVT within the right lower extremity.

## 2019-11-22 ENCOUNTER — Telehealth: Payer: Self-pay

## 2019-11-22 DIAGNOSIS — Z9189 Other specified personal risk factors, not elsewhere classified: Secondary | ICD-10-CM

## 2019-11-22 DIAGNOSIS — Z803 Family history of malignant neoplasm of breast: Secondary | ICD-10-CM

## 2019-11-22 DIAGNOSIS — Z1239 Encounter for other screening for malignant neoplasm of breast: Secondary | ICD-10-CM

## 2019-11-22 NOTE — Telephone Encounter (Signed)
New order placed for Bilateral Breast MRI w/wo contrast at [facility].  For increased risk of breast cancer, discussed at 10/25/19 AEX.   Call to patient. Provided [facility] contact information. Patient will call to schedule. Once scheduled, our office will precert. Questions answered. Patient verbalizes understanding and is agreeable. Patient aware to call if any questions/concerns.   Routing to provider for final review. Patient is agreeable to disposition. Will close encounter.   Cc: Wilson Singer, Magdalene Patricia

## 2019-11-22 NOTE — Telephone Encounter (Signed)
Patient notified of normal labs. She states hasn't received call from [facility] regarding her breast MRI appointment. Will route to Triage to follow up on.

## 2019-12-21 ENCOUNTER — Ambulatory Visit
Admission: RE | Admit: 2019-12-21 | Discharge: 2019-12-21 | Disposition: A | Discharge status: Home / Self Care | Payer: BC Managed Care – PPO | Source: Ambulatory Visit | Attending: Obstetrics and Gynecology | Admitting: Obstetrics and Gynecology

## 2019-12-21 ENCOUNTER — Other Ambulatory Visit: Payer: Self-pay

## 2019-12-21 DIAGNOSIS — C50911 Malignant neoplasm of unspecified site of right female breast: Secondary | ICD-10-CM | POA: Diagnosis not present

## 2019-12-21 DIAGNOSIS — Z1239 Encounter for other screening for malignant neoplasm of breast: Secondary | ICD-10-CM

## 2019-12-21 DIAGNOSIS — Z803 Family history of malignant neoplasm of breast: Secondary | ICD-10-CM

## 2019-12-21 DIAGNOSIS — Z9189 Other specified personal risk factors, not elsewhere classified: Secondary | ICD-10-CM

## 2019-12-21 MED ORDER — GADOBUTROL 1 MMOL/ML IV SOLN
10.0000 mL | Freq: Once | INTRAVENOUS | Status: AC | PRN
  Administered 2019-12-21: 10 mL via INTRAVENOUS

## 2019-12-22 ENCOUNTER — Other Ambulatory Visit: Payer: Self-pay | Admitting: Obstetrics and Gynecology

## 2019-12-22 ENCOUNTER — Telehealth: Payer: Self-pay | Admitting: *Deleted

## 2019-12-22 DIAGNOSIS — F3342 Major depressive disorder, recurrent, in full remission: Secondary | ICD-10-CM | POA: Diagnosis not present

## 2019-12-22 DIAGNOSIS — N6310 Unspecified lump in the right breast, unspecified quadrant: Secondary | ICD-10-CM

## 2019-12-22 DIAGNOSIS — N6320 Unspecified lump in the left breast, unspecified quadrant: Secondary | ICD-10-CM

## 2019-12-22 DIAGNOSIS — R928 Other abnormal and inconclusive findings on diagnostic imaging of breast: Secondary | ICD-10-CM

## 2019-12-22 DIAGNOSIS — R599 Enlarged lymph nodes, unspecified: Secondary | ICD-10-CM

## 2019-12-22 NOTE — Telephone Encounter (Signed)
See results note which states the following:  "Phone call with patient to discuss results of MRI.   She states she had her Covid vaccine in her right arm 4 weeks ago.   I discussed the findings and recommendation for right axillary [place] and possible lymph node biopsy and also for bilateral breast biopsies with MRI guidance.   Will proceed with organizing this evaluation."  Please place in imaging hold.

## 2019-12-22 NOTE — Telephone Encounter (Addendum)
Orders placed for:  Right breast US, to include axilla. IMG 5532 [place] axillary node core biopsy right IMG 5770 MRI Guided right breast biopsy and clip placement. IMG [id number], [id number] MRI guided left breast biopsy and clip placement. [id number], [id number]  Confirmed orders with TBC. TBC will contact patient directly to schedule.   Placed in [place] hold.   Call returned to patient to provide update. Patient is aware she will be contacted directly by [facility] for scheduling, is aware to call if any concerns.

## 2019-12-22 NOTE — Telephone Encounter (Signed)
Diane from [facility] calling report for Bilateral Breast MRI completed on 12/21/19. Result available in Epic.    IMPRESSION: 1. There are 2 masses in the right breast and 3 in the left breast. The masses are all T2 bright and demonstrate persistent kinetics. 2. There is a prominent node in the high right axilla with a cortex measuring up to 11 mm.  RECOMMENDATION: Recommend second-look ultrasound of the right axillary lymph nodes. If the node in the high right axilla is abnormal in appearance, recommend biopsy. Recommend MRI guided biopsy of the right breast mass on series 7, image 122 measuring 7 mm. Recommend MRI guided biopsy of the dominant left breast mass on series 7, image 84 measuring 6 mm. If the biopsies are all benign, recommend a six-month follow-up MRI of the other masses described above.  BI-RADS CATEGORY  4: Suspicious.     Routing to Dr. Quincy Simmonds to review.

## 2019-12-24 NOTE — Telephone Encounter (Signed)
Per review of Epic, patient is scheduled for 12/29/19 for 2nd look ultrasound and US guided biopsy of right axilla. L/R MRI guided biopsies will be scheduled after this imaging has been completed, confirmed with T[facility].   Routing to Dr. Antony Blackbird.   Continue IMG hold.   Encounter closed.

## 2019-12-24 NOTE — Telephone Encounter (Signed)
Call placed to [facility], imaging not scheduled to date. Spoke with Clarise Cruz, requested they contact the patient today to schedule. Was advised they will contact the patient today to schedule.

## 2019-12-29 ENCOUNTER — Ambulatory Visit
Admission: RE | Admit: 2019-12-29 | Discharge: 2019-12-29 | Disposition: A | Discharge status: Home / Self Care | Payer: BC Managed Care – PPO | Source: Ambulatory Visit | Attending: Obstetrics and Gynecology | Admitting: Obstetrics and Gynecology

## 2019-12-29 ENCOUNTER — Other Ambulatory Visit: Payer: Self-pay

## 2019-12-29 DIAGNOSIS — R928 Other abnormal and inconclusive findings on diagnostic imaging of breast: Secondary | ICD-10-CM

## 2019-12-29 DIAGNOSIS — C50911 Malignant neoplasm of unspecified site of right female breast: Secondary | ICD-10-CM | POA: Diagnosis not present

## 2019-12-29 DIAGNOSIS — R599 Enlarged lymph nodes, unspecified: Secondary | ICD-10-CM

## 2020-01-04 DIAGNOSIS — E119 Type 2 diabetes mellitus without complications: Secondary | ICD-10-CM | POA: Diagnosis not present

## 2020-01-04 DIAGNOSIS — H524 Presbyopia: Secondary | ICD-10-CM | POA: Diagnosis not present

## 2020-01-04 LAB — HM DIABETES EYE EXAM

## 2020-01-05 ENCOUNTER — Encounter: Payer: Self-pay | Admitting: Family Medicine

## 2020-01-06 ENCOUNTER — Ambulatory Visit
Admission: RE | Admit: 2020-01-06 | Discharge: 2020-01-06 | Disposition: A | Discharge status: Home / Self Care | Payer: BC Managed Care – PPO | Source: Ambulatory Visit | Attending: Obstetrics and Gynecology | Admitting: Obstetrics and Gynecology

## 2020-01-06 ENCOUNTER — Other Ambulatory Visit: Payer: Self-pay | Admitting: Obstetrics and Gynecology

## 2020-01-06 ENCOUNTER — Other Ambulatory Visit: Payer: Self-pay | Admitting: Radiology

## 2020-01-06 ENCOUNTER — Other Ambulatory Visit: Payer: Self-pay

## 2020-01-06 DIAGNOSIS — N6321 Unspecified lump in the left breast, upper outer quadrant: Secondary | ICD-10-CM | POA: Diagnosis not present

## 2020-01-06 DIAGNOSIS — R928 Other abnormal and inconclusive findings on diagnostic imaging of breast: Secondary | ICD-10-CM

## 2020-01-06 DIAGNOSIS — N6310 Unspecified lump in the right breast, unspecified quadrant: Secondary | ICD-10-CM

## 2020-01-06 DIAGNOSIS — D242 Benign neoplasm of left breast: Secondary | ICD-10-CM | POA: Diagnosis not present

## 2020-01-06 DIAGNOSIS — N6320 Unspecified lump in the left breast, unspecified quadrant: Secondary | ICD-10-CM

## 2020-01-06 HISTORY — PX: BREAST BIOPSY: SHX20

## 2020-01-06 MED ORDER — GADOBUTROL 1 MMOL/ML IV SOLN
10.0000 mL | Freq: Once | INTRAVENOUS | Status: AC | PRN
  Administered 2020-01-06: 10 mL via INTRAVENOUS

## 2020-01-13 ENCOUNTER — Telehealth: Payer: Self-pay | Admitting: *Deleted

## 2020-01-13 ENCOUNTER — Other Ambulatory Visit: Payer: Self-pay | Admitting: *Deleted

## 2020-01-13 DIAGNOSIS — N6320 Unspecified lump in the left breast, unspecified quadrant: Secondary | ICD-10-CM

## 2020-01-13 DIAGNOSIS — Z803 Family history of malignant neoplasm of breast: Secondary | ICD-10-CM

## 2020-01-13 DIAGNOSIS — N6310 Unspecified lump in the right breast, unspecified quadrant: Secondary | ICD-10-CM

## 2020-01-13 DIAGNOSIS — N6452 Nipple discharge: Secondary | ICD-10-CM

## 2020-01-13 DIAGNOSIS — Z9189 Other specified personal risk factors, not elsewhere classified: Secondary | ICD-10-CM

## 2020-01-13 NOTE — Telephone Encounter (Signed)
-----   Message from Nunzio Cobbs, MD sent at 01/13/2020  7:50 AM EDT ----- Biopsy of the left breast is benign.   Biopsy of the right breast planned for 01/16/20.  Continue to keep in mammogram hold.  She also needs to be in recall for December, 2021 for bilateral breast MRI.

## 2020-01-13 NOTE — Telephone Encounter (Addendum)
Burnice Logan, RN  01/13/2020 12:53 PM EDT    Spoke with patient. Advised future order has been placed for bilateral breast MRI, to be scheduled in 07/2020. [facility] will contact you directly to schedule, our office will precert once scheduled.   Continue IMG hold.    Patient reports bloody nipple d/c following her recent left breast biopsy. Denies any other symptoms. Instructed patient to contact [facility] directly today, they will further assess, review with radiologist and make recommendations. Patient verbalizes understanding and is agreeable.   Routing to provider for final review. Patient is agreeable to disposition. Will close encounter.

## 2020-01-17 ENCOUNTER — Other Ambulatory Visit: Payer: Self-pay | Admitting: Obstetrics and Gynecology

## 2020-01-17 ENCOUNTER — Ambulatory Visit
Admission: RE | Admit: 2020-01-17 | Discharge: 2020-01-17 | Disposition: A | Discharge status: Home / Self Care | Payer: BC Managed Care – PPO | Source: Ambulatory Visit | Attending: Obstetrics and Gynecology | Admitting: Obstetrics and Gynecology

## 2020-01-17 ENCOUNTER — Other Ambulatory Visit: Payer: Self-pay | Admitting: Diagnostic Radiology

## 2020-01-17 ENCOUNTER — Other Ambulatory Visit: Payer: Self-pay

## 2020-01-17 DIAGNOSIS — N6320 Unspecified lump in the left breast, unspecified quadrant: Secondary | ICD-10-CM

## 2020-01-17 DIAGNOSIS — N6314 Unspecified lump in the right breast, lower inner quadrant: Secondary | ICD-10-CM | POA: Diagnosis not present

## 2020-01-17 DIAGNOSIS — N6310 Unspecified lump in the right breast, unspecified quadrant: Secondary | ICD-10-CM

## 2020-01-17 DIAGNOSIS — R928 Other abnormal and inconclusive findings on diagnostic imaging of breast: Secondary | ICD-10-CM

## 2020-01-17 DIAGNOSIS — N6011 Diffuse cystic mastopathy of right breast: Secondary | ICD-10-CM | POA: Diagnosis not present

## 2020-01-17 HISTORY — PX: BREAST BIOPSY: SHX20

## 2020-01-17 MED ORDER — GADOBUTROL 1 MMOL/ML IV SOLN
10.0000 mL | Freq: Once | INTRAVENOUS | Status: AC | PRN
  Administered 2020-01-17: 10 mL via INTRAVENOUS

## 2020-02-03 ENCOUNTER — Encounter: Payer: Self-pay | Admitting: Family Medicine

## 2020-02-04 ENCOUNTER — Encounter: Payer: Self-pay | Admitting: Family Medicine

## 2020-02-23 DIAGNOSIS — Z96651 Presence of right artificial knee joint: Secondary | ICD-10-CM | POA: Diagnosis not present

## 2020-02-23 DIAGNOSIS — M25551 Pain in right hip: Secondary | ICD-10-CM | POA: Diagnosis not present

## 2020-02-23 DIAGNOSIS — Z96652 Presence of left artificial knee joint: Secondary | ICD-10-CM | POA: Diagnosis not present

## 2020-03-07 DIAGNOSIS — M7061 Trochanteric bursitis, right hip: Secondary | ICD-10-CM | POA: Diagnosis not present

## 2020-03-07 DIAGNOSIS — M6281 Muscle weakness (generalized): Secondary | ICD-10-CM | POA: Diagnosis not present

## 2020-03-07 DIAGNOSIS — M7631 Iliotibial band syndrome, right leg: Secondary | ICD-10-CM | POA: Diagnosis not present

## 2020-05-17 ENCOUNTER — Telehealth: Payer: Self-pay | Admitting: Family Medicine

## 2020-05-17 NOTE — Telephone Encounter (Signed)
Patient called wanting an appointment with Dr. Ethlyn Gallery for a possible ear infection and dizziness.   I offered 8 AM on Friday and she wanted something later that day and as I was offering for her to see another provider she said that she will call back if it gets worse and hung up.

## 2020-06-12 ENCOUNTER — Other Ambulatory Visit: Payer: Self-pay | Admitting: Obstetrics and Gynecology

## 2020-06-12 DIAGNOSIS — Z1231 Encounter for screening mammogram for malignant neoplasm of breast: Secondary | ICD-10-CM

## 2020-06-14 ENCOUNTER — Other Ambulatory Visit: Payer: Self-pay

## 2020-06-14 ENCOUNTER — Ambulatory Visit
Admission: RE | Admit: 2020-06-14 | Discharge: 2020-06-14 | Disposition: A | Discharge status: Home / Self Care | Payer: BC Managed Care – PPO | Source: Ambulatory Visit | Attending: Obstetrics and Gynecology | Admitting: Obstetrics and Gynecology

## 2020-06-14 DIAGNOSIS — Z1231 Encounter for screening mammogram for malignant neoplasm of breast: Secondary | ICD-10-CM

## 2020-06-19 ENCOUNTER — Telehealth: Payer: Self-pay

## 2020-06-19 ENCOUNTER — Other Ambulatory Visit: Payer: Self-pay | Admitting: Obstetrics and Gynecology

## 2020-06-19 DIAGNOSIS — N63 Unspecified lump in unspecified breast: Secondary | ICD-10-CM

## 2020-06-19 NOTE — Telephone Encounter (Signed)
Patient would like to discuss mammogram results.

## 2020-06-19 NOTE — Telephone Encounter (Signed)
Spoke with patient. Patient is scheduled for a 6 mo f/u Breast MRI on 06/21/20.  Advised per review of screening breast MRI on 06/14/20, further evaluation is needed of possible right breast mass. Advised [facility] will be contacting you directly to schedule this. Advised patient to cancel MRI scheduled for 06/21/20, will wait for right breast Dx IMG results for further recommendations. Patient will contact [facility] directly to cancel. Questions answered. Patient verbalizes understanding and is agreeable.   Placed in [place] hold.   Routing to provider for final review. Patient is agreeable to disposition. Will close encounter.  Cc: Hayley Carder

## 2020-06-20 ENCOUNTER — Other Ambulatory Visit: Payer: Self-pay | Admitting: Obstetrics and Gynecology

## 2020-06-20 DIAGNOSIS — R928 Other abnormal and inconclusive findings on diagnostic imaging of breast: Secondary | ICD-10-CM

## 2020-06-21 ENCOUNTER — Other Ambulatory Visit: Payer: BC Managed Care – PPO

## 2020-06-29 ENCOUNTER — Encounter: Payer: Self-pay | Admitting: Family Medicine

## 2020-06-29 DIAGNOSIS — Z8249 Family history of ischemic heart disease and other diseases of the circulatory system: Secondary | ICD-10-CM

## 2020-06-30 ENCOUNTER — Ambulatory Visit
Admission: RE | Admit: 2020-06-30 | Discharge: 2020-06-30 | Disposition: A | Discharge status: Home / Self Care | Payer: BC Managed Care – PPO | Source: Ambulatory Visit | Attending: Obstetrics and Gynecology | Admitting: Obstetrics and Gynecology

## 2020-06-30 ENCOUNTER — Other Ambulatory Visit: Payer: Self-pay

## 2020-06-30 DIAGNOSIS — R928 Other abnormal and inconclusive findings on diagnostic imaging of breast: Secondary | ICD-10-CM | POA: Diagnosis not present

## 2020-06-30 DIAGNOSIS — N6011 Diffuse cystic mastopathy of right breast: Secondary | ICD-10-CM | POA: Diagnosis not present

## 2020-07-03 ENCOUNTER — Telehealth: Payer: Self-pay

## 2020-07-03 DIAGNOSIS — N6310 Unspecified lump in the right breast, unspecified quadrant: Secondary | ICD-10-CM

## 2020-07-03 DIAGNOSIS — N6320 Unspecified lump in the left breast, unspecified quadrant: Secondary | ICD-10-CM

## 2020-07-03 DIAGNOSIS — Z9189 Other specified personal risk factors, not elsewhere classified: Secondary | ICD-10-CM

## 2020-07-03 DIAGNOSIS — Z803 Family history of malignant neoplasm of breast: Secondary | ICD-10-CM

## 2020-07-03 NOTE — Telephone Encounter (Signed)
New recall placed for Dec 2021.  Breast MRI orders placed for [facility].  Cc: Alfonse Spruce for precert  Cc: Sharee Pimple, RN- MMG hold  Encounter closed

## 2020-07-03 NOTE — Telephone Encounter (Signed)
-----   Message from Nunzio Cobbs, MD sent at 07/03/2020  7:28 AM EST ----- Please remove from current recall and place in new recall for December for MRI of the breast.  Please proceed forward with scheduling of this.  It may need a new precert due to the new information of this dx breast imaging as she has masses that have not been biopsied.

## 2020-07-04 NOTE — Telephone Encounter (Signed)
Patient removed from MMG hold.  Placed in [place] hold for 6 mo f/u breast MRI.

## 2020-07-18 DIAGNOSIS — F422 Mixed obsessional thoughts and acts: Secondary | ICD-10-CM | POA: Diagnosis not present

## 2020-07-28 ENCOUNTER — Other Ambulatory Visit: Payer: Self-pay

## 2020-07-28 ENCOUNTER — Ambulatory Visit
Admission: RE | Admit: 2020-07-28 | Discharge: 2020-07-28 | Disposition: A | Discharge status: Home / Self Care | Payer: BC Managed Care – PPO | Source: Ambulatory Visit | Attending: Obstetrics and Gynecology | Admitting: Obstetrics and Gynecology

## 2020-07-28 DIAGNOSIS — Z803 Family history of malignant neoplasm of breast: Secondary | ICD-10-CM

## 2020-07-28 DIAGNOSIS — N6320 Unspecified lump in the left breast, unspecified quadrant: Secondary | ICD-10-CM

## 2020-07-28 DIAGNOSIS — N6310 Unspecified lump in the right breast, unspecified quadrant: Secondary | ICD-10-CM

## 2020-07-28 DIAGNOSIS — Z9189 Other specified personal risk factors, not elsewhere classified: Secondary | ICD-10-CM

## 2020-07-28 DIAGNOSIS — N6322 Unspecified lump in the left breast, upper inner quadrant: Secondary | ICD-10-CM | POA: Diagnosis not present

## 2020-07-28 MED ORDER — GADOBUTROL 1 MMOL/ML IV SOLN
10.0000 mL | Freq: Once | INTRAVENOUS | Status: AC | PRN
  Administered 2020-07-28: 10 mL via INTRAVENOUS

## 2020-08-02 ENCOUNTER — Telehealth: Payer: Self-pay

## 2020-08-02 NOTE — Telephone Encounter (Signed)
-----   Message from Nunzio Cobbs, MD sent at 08/02/2020  8:18 AM EST ----- Please contact patient with results of her MRI showing no evidence of cancer.  The radiologist is recommending a follow up MRI in 6 months to follow areas of each breast.  Please place in recall for June, 2022.

## 2020-08-02 NOTE — Telephone Encounter (Signed)
Called patient and per DPR left detailed message stating MRI showed no evidence of cancer. She will need follow up MRI in 6 months. Call if any questions.

## 2020-10-26 ENCOUNTER — Other Ambulatory Visit: Payer: Self-pay

## 2020-10-26 ENCOUNTER — Ambulatory Visit (INDEPENDENT_AMBULATORY_CARE_PROVIDER_SITE_OTHER): Payer: BC Managed Care – PPO | Admitting: Obstetrics and Gynecology

## 2020-10-26 ENCOUNTER — Encounter: Payer: Self-pay | Admitting: Obstetrics and Gynecology

## 2020-10-26 ENCOUNTER — Telehealth: Payer: Self-pay | Admitting: Obstetrics and Gynecology

## 2020-10-26 VITALS — BP 158/82 | HR 89 | Ht 67.5 in | Wt 265.0 lb

## 2020-10-26 DIAGNOSIS — N912 Amenorrhea, unspecified: Secondary | ICD-10-CM | POA: Diagnosis not present

## 2020-10-26 DIAGNOSIS — Z01419 Encounter for gynecological examination (general) (routine) without abnormal findings: Secondary | ICD-10-CM | POA: Diagnosis not present

## 2020-10-26 DIAGNOSIS — B372 Candidiasis of skin and nail: Secondary | ICD-10-CM | POA: Diagnosis not present

## 2020-10-26 MED ORDER — NYSTATIN 100000 UNIT/GM EX POWD: 1.0000 "application " | Freq: Three times a day (TID) | CUTANEOUS | 3 refills | Status: DC

## 2020-10-26 MED ORDER — FLUCONAZOLE 150 MG PO TABS: 150.0000 mg | ORAL_TABLET | Freq: Once | ORAL | 0 refills | Status: AC

## 2020-10-26 NOTE — Telephone Encounter (Signed)
[facility] to [facility] to see when patient is due for her next mammogram.   She is due for a breast MRI in May.   She had several imaging procedures and 2 breast biopsies last year.   Thank you for assisting in the scheduling of these procedures.

## 2020-10-26 NOTE — Progress Notes (Signed)
50 y.o. G5P1021 Married Caucasian female here for annual exam.   Had spotting 12/2019. LNMP 10/2018 approximately. Had some night sweats in summer and spring of 2020 and none since.   Received her Covid booster.   PCP: Micheline Rough, MD    Patient's last menstrual period was 10/25/2018 (approximate).     Period Pattern: (!) Irregular     Sexually active: Yes.    The current method of family planning is none--female partner.    Exercising: Yes.    walking and some cycling Smoker:  no  Health Maintenance: Pap: 09-19-17 Neg:Neg HR HPV, 04-29-14 Neg:Neg HR HPV, 04-23-13 Neg History of abnormal Pap:  no MMG: 07-28-20 MR Breast Bil./Rt.Br.stable 23mm mass;Lt.Br.stable 4-5 mm mass/Rec 6 mo.f/u MR Breast Bil.to document stability of probable benign masses/Birads3 Colonoscopy: 04-10-17 normal;next 10 years BMD:   n/a  Result  n/a TDaP:  2014 Gardasil:   no HIV: 05-19-15 NR Hep C: 05-19-15 Neg Screening Labs:  PCP.   reports that she has never smoked. She has never used smokeless tobacco. She reports current alcohol use of about 4.0 - 6.0 standard drinks of alcohol per week. She reports that she does not use drugs.  Past Medical History:  Diagnosis Date  . Anxiety and depression   . Arthritis   . Complication of anesthesia    has been hard to wake up post-op  . Dental crowns present   . Depression   . Diabetes ([place])    resolved with gastric bypass, no meds  . DVT of lower extremity (deep venous thrombosis) ([place]) 2012   LLE  . Dysmenorrhea    hx endometriosis and fibroid  . Elevated prolactin level 2021  . Family history of breast cancer   . Headache(784.0)    tension or sinus  . History of MRSA infection prior to 2012   lasted 5-6 yr.  . Hx of nipple discharge    Bilateral.  Long standing.  Negative work up.   . Jaw snapping    states jaw pops  . Left foot drop 2016   --[facility]  . Neuromuscular disorder ([place])    neuropathy in hands,feet,face  . Neuropathy 2015    hands, feet and face--[facility]  . Prothrombin gene mutation ([place]) 2018   single gene mutation  . Right knee pain    hx meniscal injury, chondromalacia, OA  . Thyroid nodule     Past Surgical History:  Procedure Laterality Date  . CHOLECYSTECTOMY  1990's  . DILITATION & CURRETTAGE/HYSTROSCOPY WITH NOVASURE ABLATION N/A 05/30/2014   Procedure: DILATATION & CURETTAGE/HYSTEROSCOPY WITH attempted [place] and with IUD removal ;  Surgeon: Jamey Reas de Berton Lan, MD;  Location: [facility];  Service: Gynecology;  Laterality: N/A;  . GASTRIC ROUX-EN-Y N/A 11/02/2013   Procedure: LAPAROSCOPIC ROUX-EN-Y GASTRIC BYPASS WITH UPPER ENDOSCOPY;  Surgeon: Gayland Curry, MD;  Location: [facility];  Service: General;  Laterality: N/A;  . HAND SURGERY Left    X 3 - spider bite and MRSA infection  . HYSTEROSCOPY WITH RESECTOSCOPE N/A 05/30/2014   Procedure: HYSTEROSCOPY WITH RESECTOSCOPE;  Surgeon: Jamey Reas de Berton Lan, MD;  Location: [facility];  Service: Gynecology;  Laterality: N/A;  . KNEE ARTHROSCOPY Right 12/29/2012   Procedure: RIGHT KNEE ARTHROSCOPY  PARTIAL MEDIAL AND LATERAL MENISECTOMY WITH CHONDROPLASTY;  Surgeon: Hessie Dibble, MD;  Location: [facility];  Service: Orthopedics;  Laterality: Right;  . PELVIC LAPAROSCOPY  1990's   d/t  endometriosis  . TONSILLECTOMY  as child  . TOTAL KNEE ARTHROPLASTY Left 07/22/2017   Procedure: TOTAL KNEE ARTHROPLASTY;  Surgeon: Melrose Nakayama, MD;  Location: [facility];  Service: Orthopedics;  Laterality: Left;  . TOTAL KNEE ARTHROPLASTY Right 05/26/2018   Procedure: RIGHT TOTAL KNEE ARTHROPLASTY;  Surgeon: Melrose Nakayama, MD;  Location: [facility];  Service: Orthopedics;  Laterality: Right;  . WRIST SURGERY Right 2005    Current Outpatient Medications  Medication Sig Dispense Refill  . Acetaminophen (TYLENOL PO) Take 1 capsule by mouth every 8 (eight) hours.    . Acetaminophen 167 MG/5ML LIQD Take 500 mg by mouth  2 (two) times daily as needed (for headache or pain).    Marland Kitchen alprazolam (XANAX) 2 MG tablet Take 2 mg by mouth 2 (two) times daily as needed.    . DULoxetine (CYMBALTA) 60 MG capsule Take 120 mg by mouth every morning.     . nystatin (MYCOSTATIN/NYSTOP) powder Apply 1 application topically 3 (three) times daily. Apply to affected area for up to 7 days 30 g 2  . traZODone (DESYREL) 100 MG tablet Take 1 tablet by mouth at bedtime as needed.     No current facility-administered medications for this visit.    Family History  Problem Relation Age of Onset  . Diabetes Mother        Living  . Kidney disease Mother   . Hypertension Mother   . Breast cancer Mother 23       dx'd with breast ca x2; 2nd around 49; 3rd 60's  . Diabetes Maternal Grandmother 57       dec  . Breast cancer Maternal Grandmother        dx in her 58s  . Arthritis Father   . Healthy Sister   . Aneurysm Sister 55       brain  . High blood pressure Sister   . Diabetes Maternal Grandfather   . Stroke Maternal Grandfather   . Other Paternal Grandmother        died in childbirth  . Other Paternal Grandfather        unknown  . Healthy Daughter   . Breast cancer Other        2 great aunts  . Thyroid disease Neg Hx     Review of Systems  All other systems reviewed and are negative.   Exam:   BP (!) 158/82 (Cuff Size: Large)   Pulse 89   Ht 5' 7.5" (1.715 m)   Wt 265 lb (120.2 kg)   LMP 10/25/2018 (Approximate)   SpO2 98%   BMI 40.89 kg/m     General appearance: alert, cooperative and appears stated age Head: normocephalic, without obvious abnormality, atraumatic Neck: no adenopathy, supple, symmetrical, trachea midline and thyroid normal to inspection and palpation Lungs: clear to auscultation bilaterally Breasts: normal appearance, no masses or tenderness, No nipple retraction or dimpling, No nipple discharge or bleeding, No axillary adenopathy Heart: regular rate and rhythm Abdomen: soft, non-tender; no  masses, no organomegaly Extremities: extremities normal, atraumatic, no cyanosis or edema Skin: skin color, texture, turgor normal. Erythematous patch under pannus and left breast.  Lymph nodes: cervical, supraclavicular, and axillary nodes normal. Neurologic: grossly normal  Pelvic: External genitalia:  no lesions              No abnormal inguinal nodes palpated.              Urethra:  normal appearing urethra with no masses, tenderness or  lesions              Bartholins and Skenes: normal                 Vagina: normal appearing vagina with normal color and discharge, no lesions              Cervix: no lesions              Pap taken: No. Bimanual Exam:  Uterus:  normal size, contour, position, consistency, mobility, non-tender              Adnexa: no mass, fullness, tenderness              Rectal exam: Yes.  .  Confirms.              Anus:  normal sphincter tone, no lesions  Chaperone was present for exam.  Assessment:   Well woman visit with normal exam. Amenorrhea.  Likely is perimenopausal.  Status post endometrial ablation. Fibroids. Small. Prothrombin gene mutation.  Hx DVT. Hx bilateral nipple discharge with negative work up. FH of breast cancer. Increased lifetime risk of breast cancer - 22.1%.  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 Candida of flexural fold.   Plan: Mammogram and breast MRI will be ordered after contacting [facility] to confirm the timing of each.  Self breast awareness reviewed. Pap and HR HPV 2024.  Guidelines for Calcium, Vitamin D, regular exercise program including cardiovascular and weight bearing exercise. Check FSH and estradiol. Routine labs with PCP.  Diflucan 150 mg po x 1.  May repeat in 72 hours prn.  Rx for Nystatin powder.  Follow up annually and prn.

## 2020-10-27 LAB — FOLLICLE STIMULATING HORMONE: FSH: 56.1 m[IU]/mL

## 2020-10-27 LAB — ESTRADIOL: Estradiol: <15 pg/mL

## 2020-10-27 NOTE — Telephone Encounter (Signed)
Spoke with [name] at [facility]. Was advised next screening MMG due 06/2021.  Breast MRI in May is a 6 month f/u.   Call to patient to notify. Advised as seen above. Patient will contact [facility] directly to schedule.   Patient verbalizes understanding and is agreeable.   Encounter closed.

## 2020-11-03 ENCOUNTER — Encounter: Payer: BC Managed Care – PPO | Admitting: Family Medicine

## 2021-01-29 ENCOUNTER — Encounter: Payer: BC Managed Care – PPO | Admitting: Family Medicine

## 2021-03-05 ENCOUNTER — Other Ambulatory Visit: Payer: Self-pay

## 2021-03-05 ENCOUNTER — Ambulatory Visit (INDEPENDENT_AMBULATORY_CARE_PROVIDER_SITE_OTHER): Payer: BC Managed Care – PPO | Admitting: Family Medicine

## 2021-03-05 ENCOUNTER — Encounter: Payer: Self-pay | Admitting: Family Medicine

## 2021-03-05 VITALS — BP 148/90 | HR 72 | Temp 97.9°F | Ht 68.25 in | Wt 262.3 lb

## 2021-03-05 DIAGNOSIS — R030 Elevated blood-pressure reading, without diagnosis of hypertension: Secondary | ICD-10-CM

## 2021-03-05 DIAGNOSIS — R7301 Impaired fasting glucose: Secondary | ICD-10-CM | POA: Diagnosis not present

## 2021-03-05 DIAGNOSIS — Z0000 Encounter for general adult medical examination without abnormal findings: Secondary | ICD-10-CM

## 2021-03-05 DIAGNOSIS — E785 Hyperlipidemia, unspecified: Secondary | ICD-10-CM

## 2021-03-05 DIAGNOSIS — E559 Vitamin D deficiency, unspecified: Secondary | ICD-10-CM

## 2021-03-05 DIAGNOSIS — Z8249 Family history of ischemic heart disease and other diseases of the circulatory system: Secondary | ICD-10-CM | POA: Diagnosis not present

## 2021-03-05 DIAGNOSIS — E538 Deficiency of other specified B group vitamins: Secondary | ICD-10-CM

## 2021-03-05 DIAGNOSIS — R928 Other abnormal and inconclusive findings on diagnostic imaging of breast: Secondary | ICD-10-CM

## 2021-03-05 DIAGNOSIS — F39 Unspecified mood [affective] disorder: Secondary | ICD-10-CM

## 2021-03-05 DIAGNOSIS — G629 Polyneuropathy, unspecified: Secondary | ICD-10-CM

## 2021-03-05 NOTE — Progress Notes (Signed)
[name] DOB: 1971/01/31 Encounter date: 03/05/2021  This is a 50 y.o. female who presents for complete physical   History of present illness/Additional concerns: Last visit with me was 03/12/2019 to establish care.  This was a virtual visit.  Has been busy. Sister had brain aneurysm. She would like to have CT angio completed but was told she had to have visit here.   Working from home has been harder to be good about eating. Might be able to go back into work.   No regular exercise. Eats most meals in house.    Anxiety/depression:mood has been kind of down in general. Anxiety pretty controlled.   Headache/neuropathy: Started approximately 6 years ago: Started with neuropathy in left foot - foot drop, couldn't feel foot. Crept up leg. Not on right. Then spread to hands, then up arms - left side then right side and then face. Skin feels dull, numb to touch. But does feel burning/prickling in skin all the time. Has had extensive evaluation for this without finding reason. Foot drop did resolve after PT. Still has to be careful with walking/step. Easier to fall. Team of doctors was working on this. Getting regular imaging of brain to follow. Dr. Manson Allan (At [facility] - integrative medicine) was helping with multi-specialty managing. (Last visit was 01/2019) This sx started about 4 years ago  Has headaches all the time. Neuropathy not necessarily gotten worse, but feels harder to handle. Cybmalta '120mg'$  daily. Tried to go off of this but pain was worse. Wakes up in night every night in pain. Taking tylenol. Tried nortriptyline. Tried gabapentin, but no relief. Waking up with pain is main issue. Has tried nortriptyline in past as well. Headaches more anterior frontal; alternate sides.   Energy level: hard to say because it is really hot, which makes her not want to do things; but a little lower.   Taking tylenol pretty much every day. Headache every day. Not eating like she should.  Follows with Dr.  Quincy Simmonds for GYN needs.  Last visit was 10/26/2020.  Bilateral breast MRI completed 07/28/2020.  This is suggested to repeat in 6 months (June/2022)  Last colonoscopy 04/10/2017.  Repeat in 10 years Past Medical History:  Diagnosis Date   Anxiety and depression    Arthritis    Complication of anesthesia    has been hard to wake up post-op   Dental crowns present    Depression    Diabetes ([place])    resolved with gastric bypass, no meds   DVT of lower extremity (deep venous thrombosis) ([place]) 2012   LLE   Dysmenorrhea    hx endometriosis and fibroid   Elevated prolactin level 2021   Family history of breast cancer    Headache(784.0)    tension or sinus   History of MRSA infection prior to 2012   lasted 5-6 yr.   Hx of nipple discharge    Bilateral.  Long standing.  Negative work up.    Jaw snapping    states jaw pops   Left foot drop 2016   --[facility]   Neuromuscular disorder ([place])    neuropathy in hands,feet,face   Neuropathy 2015   hands, feet and face--[facility]   Prothrombin gene mutation ([place]) 2018   single gene mutation   Right knee pain    hx meniscal injury, chondromalacia, OA   Thyroid nodule    Past Surgical History:  Procedure Laterality Date   CHOLECYSTECTOMY  1990's   DILITATION & CURRETTAGE/HYSTROSCOPY  WITH NOVASURE ABLATION N/A 05/30/2014   Procedure: DILATATION & CURETTAGE/HYSTEROSCOPY WITH attempted [place] and with IUD removal ;  Surgeon: Jamey Reas de Berton Lan, MD;  Location: [facility];  Service: Gynecology;  Laterality: N/A;   GASTRIC ROUX-EN-Y N/A 11/02/2013   Procedure: LAPAROSCOPIC ROUX-EN-Y GASTRIC BYPASS WITH UPPER ENDOSCOPY;  Surgeon: Gayland Curry, MD;  Location[facility];  Service: General;  Laterality: N/A;   HAND SURGERY Left    X 3 - spider bite and MRSA infection   HYSTEROSCOPY WITH RESECTOSCOPE N/A 05/30/2014   Procedure: HYSTEROSCOPY WITH RESECTOSCOPE;  Surgeon: Jamey Reas de Berton Lan, MD;   Location: [facility];  Service: Gynecology;  Laterality: N/A;   KNEE ARTHROSCOPY Right 12/29/2012   Procedure: RIGHT KNEE ARTHROSCOPY  PARTIAL MEDIAL AND LATERAL MENISECTOMY WITH CHONDROPLASTY;  Surgeon: Hessie Dibble, MD;  Location: [facility];  Service: Orthopedics;  Laterality: Right;   PELVIC LAPAROSCOPY  1990's   d/t endometriosis   TONSILLECTOMY  as child   TOTAL KNEE ARTHROPLASTY Left 07/22/2017   Procedure: TOTAL KNEE ARTHROPLASTY;  Surgeon: Melrose Nakayama, MD;  Location: [facility];  Service: Orthopedics;  Laterality: Left;   TOTAL KNEE ARTHROPLASTY Right 05/26/2018   Procedure: RIGHT TOTAL KNEE ARTHROPLASTY;  Surgeon: Melrose Nakayama, MD;  Location: [facility];  Service: Orthopedics;  Laterality: Right;   WRIST SURGERY Right 2005   Allergies  Allergen Reactions   Bactrim [Sulfamethoxazole-Trimethoprim] Other (See Comments)    HALLUCINATIONS, FEVER, CHILLS   Ibuprofen Other (See Comments)    Had gastric bypass. Told not to take NSAIDs.   Lamotrigine Hives   Sulfa Antibiotics Other (See Comments)    HALLUCINATIONS, FEVER, CHILLS     Current Meds  Medication Sig   Acetaminophen (TYLENOL PO) Take 1 capsule by mouth every 8 (eight) hours.   alprazolam (XANAX) 2 MG tablet Take 2 mg by mouth 2 (two) times daily as needed.   DULoxetine (CYMBALTA) 60 MG capsule Take 120 mg by mouth every morning.    nystatin (MYCOSTATIN/NYSTOP) powder Apply 1 application topically 3 (three) times daily. Apply to affected area for up to 7 days   traZODone (DESYREL) 100 MG tablet Take 1 tablet by mouth at bedtime as needed.   Social History   Tobacco Use   Smoking status: Never   Smokeless tobacco: Never  Substance Use Topics   Alcohol use: Yes    Alcohol/week: 4.0 - 6.0 standard drinks    Types: 4 - 6 Glasses of wine per week   Family History  Problem Relation Age of Onset   Diabetes Mother        Living   Kidney disease Mother    Hypertension Mother    Breast cancer Mother 27        dx'd with breast ca x2; 2nd around 69; 18rd 60's   Diabetes Maternal Grandmother 81       dec   Breast cancer Maternal Grandmother        dx in her 1s   Arthritis Father    Healthy Sister    Aneurysm Sister 27       brain   High blood pressure Sister    Diabetes Maternal Grandfather    Stroke Maternal Grandfather    Other Paternal Grandmother        died in childbirth   Other Paternal Grandfather        unknown   Healthy Daughter    Breast cancer Other  2 great aunts   Thyroid disease Neg Hx      Review of Systems  Constitutional:  Positive for fatigue. Negative for activity change, appetite change, chills, fever and unexpected weight change.  HENT:  Negative for congestion, ear pain, hearing loss, sinus pressure, sinus pain, sore throat and trouble swallowing.   Eyes:  Negative for pain and visual disturbance.  Respiratory:  Negative for cough, chest tightness, shortness of breath and wheezing.   Cardiovascular:  Negative for chest pain, palpitations and leg swelling.  Gastrointestinal:  Negative for abdominal pain, blood in stool, constipation, diarrhea, nausea and vomiting.  Genitourinary:  Negative for difficulty urinating and menstrual problem.  Musculoskeletal:  Negative for arthralgias and back pain.  Skin:  Negative for rash.  Neurological:  Positive for numbness (face, hands, feet) and headaches. Negative for dizziness and weakness.  Hematological:  Negative for adenopathy. Does not bruise/bleed easily.  Psychiatric/Behavioral:  Negative for sleep disturbance and suicidal ideas. The patient is not nervous/anxious.    CBC:  Lab Results  Component Value Date   WBC 6.7 07/05/2019   HGB 13.7 07/05/2019   HGB 13.6 10/29/2017   HGB 13.3 09/19/2017   HGB 13.0 05/14/2017   HCT 42.4 07/05/2019   HCT 42.1 09/19/2017   HCT 39.8 05/14/2017   MCH 30.4 07/05/2019   MCHC 32.3 07/05/2019   RDW 13.5 07/05/2019   RDW 13.7 09/19/2017   RDW 12.9 05/14/2017   PLT 350  07/05/2019   PLT 371 10/29/2017   PLT 380 (H) 09/19/2017   MPV 9.7 05/19/2015   CMP: Lab Results  Component Value Date   NA 138 07/05/2019   NA 137 05/14/2017   K 4.1 07/05/2019   K 3.6 05/14/2017   CL 103 07/05/2019   CO2 24 07/05/2019   CO2 26 05/14/2017   ANIONGAP 11 07/05/2019   GLUCOSE 191 (H) 07/05/2019   GLUCOSE 108 05/14/2017   BUN 12 07/05/2019   BUN 10.7 05/14/2017   CREATININE 0.91 07/05/2019   CREATININE 0.83 10/29/2017   CREATININE 0.7 05/14/2017   GFRAA >60 07/05/2019   GFRAA >60 10/29/2017   CALCIUM 9.0 07/05/2019   CALCIUM 9.2 05/14/2017   PROT 7.9 10/29/2017   PROT 7.3 05/14/2017   BILITOT 0.2 10/29/2017   BILITOT 0.40 05/14/2017   ALKPHOS 115 10/29/2017   ALKPHOS 85 05/14/2017   ALT 14 10/29/2017   ALT 12 05/14/2017   AST 21 10/29/2017   AST 16 05/14/2017   LIPID: Lab Results  Component Value Date   CHOL 156 03/25/2014   TRIG 85 03/25/2014   HDL 40 03/25/2014   LDLCALC 99 03/25/2014    Objective:  BP (!) 148/90 (BP Location: Left Arm, Patient Position: Sitting, Cuff Size: Large)   Pulse 72   Temp 97.9 F (36.6 C) (Rectal)   Ht 5' 8.25" (1.734 m)   Wt 262 lb 4.8 oz (119 kg)   SpO2 97%   BMI 39.59 kg/m   Weight: 262 lb 4.8 oz (119 kg)   BP Readings from Last 3 Encounters:  03/05/21 (!) 148/90  10/26/20 (!) 158/82  10/25/19 138/72   Wt Readings from Last 3 Encounters:  03/05/21 262 lb 4.8 oz (119 kg)  10/26/20 265 lb (120.2 kg)  10/25/19 259 lb 9.6 oz (117.8 kg)    Physical Exam Constitutional:      General: She is not in acute distress.    Appearance: She is well-developed.  HENT:     Head: Normocephalic and atraumatic.  Right Ear: External ear normal.     Left Ear: External ear normal.     Mouth/Throat:     Pharynx: No oropharyngeal exudate.  Eyes:     Conjunctiva/sclera: Conjunctivae normal.     Pupils: Pupils are equal, round, and reactive to light.  Neck:     Thyroid: No thyromegaly.  Cardiovascular:     Rate  and Rhythm: Normal rate and regular rhythm.     Heart sounds: Normal heart sounds. No murmur heard.   No friction rub. No gallop.  Pulmonary:     Effort: Pulmonary effort is normal.     Breath sounds: Normal breath sounds.  Abdominal:     General: Bowel sounds are normal. There is no distension.     Palpations: Abdomen is soft. There is no mass.     Tenderness: There is no abdominal tenderness. There is no guarding.     Hernia: No hernia is present.  Musculoskeletal:        General: No tenderness or deformity. Normal range of motion.     Cervical back: Normal range of motion and neck supple.  Lymphadenopathy:     Cervical: No cervical adenopathy.  Skin:    General: Skin is warm and dry.     Findings: No rash.  Neurological:     Mental Status: She is alert and oriented to person, place, and time.     Deep Tendon Reflexes: Reflexes normal.     Reflex Scores:      Tricep reflexes are 2+ on the right side and 2+ on the left side.      Bicep reflexes are 2+ on the right side and 2+ on the left side.      Brachioradialis reflexes are 2+ on the right side and 2+ on the left side.      Patellar reflexes are 2+ on the right side and 2+ on the left side. Psychiatric:        Speech: Speech normal.        Behavior: Behavior normal.        Thought Content: Thought content normal.    Assessment/Plan: Health Maintenance Due  Topic Date Due   Zoster Vaccines- Shingrix (1 of 2) Never done   Health Maintenance reviewed - she will get 3rd covid shot; then can consider shingles at next visit.  1. Preventative health care We discussed getting back on track with eating and regular actvitiy. She is interested in following up with weight loss specialist, so referral has been placed.  2. Hyperlipidemia, unspecified hyperlipidemia type - Lipid panel; Future - TSH; Future  3. Family history of brain aneurysm There was some confusion with getting imaging previously ordered scheduled since she was  following up at that time for abnormal mammogram. She is aware she will be contacted to set up ct angio.  4. Impaired fasting glucose Recheck levels. May be candidate for additional treatment pending results.   5. Vitamin D deficiency - Comprehensive metabolic panel; Future - Hemoglobin A1c; Future - VITAMIN D 25 Hydroxy (Vit-D Deficiency, Fractures); Future  6. B12 deficiency - Vitamin B12; Future - Folate; Future - Homocysteine; Future - Methylmalonic acid, serum; Future  7. Mood disorder ([place]) Mood has been more sad lately, but she has had a lot going on with family/personal stressors. Generally mood is controlled with cymbalta.   8. Abnormal mammogram Overdue for follow up MR.  - MR BREAST BILATERAL W WO CONTRAST INC CAD; Future  9. Neuropathy She has  tried gabapentin in past without relief. Not tried lyrica in past. Cymbalta helps, but not enough. This pain is interrupting lifestyle and sleep. Further tx pending lab results and chart review.  - CBC with Differential/Platelet; Future - Sedimentation rate; Future  10. Elevated bp - recheck similar in office. Patient will check at home and report back to me. We discussed addition of ace/arb for renal benefit if needed for bp control  Return for pending bloodwork.  Micheline Rough, MD

## 2021-03-05 NOTE — Patient Instructions (Signed)
Call [facility]: [id number] and option 1,4

## 2021-03-07 ENCOUNTER — Other Ambulatory Visit (INDEPENDENT_AMBULATORY_CARE_PROVIDER_SITE_OTHER): Payer: BC Managed Care – PPO

## 2021-03-07 ENCOUNTER — Other Ambulatory Visit: Payer: Self-pay

## 2021-03-07 DIAGNOSIS — G629 Polyneuropathy, unspecified: Secondary | ICD-10-CM | POA: Diagnosis not present

## 2021-03-07 DIAGNOSIS — D519 Vitamin B12 deficiency anemia, unspecified: Secondary | ICD-10-CM | POA: Diagnosis not present

## 2021-03-07 DIAGNOSIS — E538 Deficiency of other specified B group vitamins: Secondary | ICD-10-CM | POA: Diagnosis not present

## 2021-03-07 DIAGNOSIS — E785 Hyperlipidemia, unspecified: Secondary | ICD-10-CM

## 2021-03-07 DIAGNOSIS — E559 Vitamin D deficiency, unspecified: Secondary | ICD-10-CM | POA: Diagnosis not present

## 2021-03-07 LAB — VITAMIN B12: Vitamin B-12: 197 pg/mL — ABNORMAL LOW (ref 211–911)

## 2021-03-07 LAB — CBC WITH DIFFERENTIAL/PLATELET
Basophils Absolute: 0.1 10*3/uL (ref 0.0–0.1)
Basophils Relative: 1.2 % (ref 0.0–3.0)
Eosinophils Absolute: 0.2 10*3/uL (ref 0.0–0.7)
Eosinophils Relative: 3.3 % (ref 0.0–5.0)
HCT: 41.7 % (ref 36.0–46.0)
Hemoglobin: 13.5 g/dL (ref 12.0–15.0)
Lymphocytes Relative: 34.3 % (ref 12.0–46.0)
Lymphs Abs: 2.4 10*3/uL (ref 0.7–4.0)
MCHC: 32.4 g/dL (ref 30.0–36.0)
MCV: 92.8 fl (ref 78.0–100.0)
Monocytes Absolute: 0.6 10*3/uL (ref 0.1–1.0)
Monocytes Relative: 9.1 % (ref 3.0–12.0)
Neutro Abs: 3.6 10*3/uL (ref 1.4–7.7)
Neutrophils Relative %: 52.1 % (ref 43.0–77.0)
Platelets: 332 10*3/uL (ref 150.0–400.0)
RBC: 4.5 Mil/uL (ref 3.87–5.11)
RDW: 14.1 % (ref 11.5–15.5)
WBC: 6.9 10*3/uL (ref 4.0–10.5)

## 2021-03-07 LAB — COMPREHENSIVE METABOLIC PANEL
ALT: 53 U/L — ABNORMAL HIGH (ref 0–35)
AST: 44 U/L — ABNORMAL HIGH (ref 0–37)
Albumin: 4.2 g/dL (ref 3.5–5.2)
Alkaline Phosphatase: 144 U/L — ABNORMAL HIGH (ref 39–117)
BUN: 13 mg/dL (ref 6–23)
CO2: 28 mEq/L (ref 19–32)
Calcium: 9.3 mg/dL (ref 8.4–10.5)
Chloride: 102 mEq/L (ref 96–112)
Creatinine, Ser: 0.85 mg/dL (ref 0.40–1.20)
GFR: 79.8 mL/min (ref >60.00)
Glucose, Bld: 169 mg/dL — ABNORMAL HIGH (ref 70–99)
Potassium: 4.2 mEq/L (ref 3.5–5.1)
Sodium: 139 mEq/L (ref 135–145)
Total Bilirubin: 0.5 mg/dL (ref 0.2–1.2)
Total Protein: 6.9 g/dL (ref 6.0–8.3)

## 2021-03-07 LAB — FOLATE: Folate: 14.7 ng/mL (ref >5.9)

## 2021-03-07 LAB — VITAMIN D 25 HYDROXY (VIT D DEFICIENCY, FRACTURES): VITD: 20.86 ng/mL — ABNORMAL LOW (ref 30.00–100.00)

## 2021-03-07 LAB — LIPID PANEL
Cholesterol: 238 mg/dL — ABNORMAL HIGH (ref 0–200)
HDL: 80.9 mg/dL (ref >39.00)
LDL Cholesterol: 122 mg/dL — ABNORMAL HIGH (ref 0–99)
NonHDL: 156.79
Total CHOL/HDL Ratio: 3
Triglycerides: 175 mg/dL — ABNORMAL HIGH (ref 0.0–149.0)
VLDL: 35 mg/dL (ref 0.0–40.0)

## 2021-03-07 LAB — HEMOGLOBIN A1C: Hgb A1c MFr Bld: 6.9 % — ABNORMAL HIGH (ref 4.6–6.5)

## 2021-03-07 LAB — TSH: TSH: 0.99 u[IU]/mL (ref 0.35–5.50)

## 2021-03-07 LAB — SEDIMENTATION RATE: Sed Rate: 29 mm/hr (ref 0–30)

## 2021-03-09 ENCOUNTER — Encounter: Payer: Self-pay | Admitting: Obstetrics and Gynecology

## 2021-03-09 NOTE — Telephone Encounter (Signed)
Please schedule patient appointment with provider.

## 2021-03-12 DIAGNOSIS — F3342 Major depressive disorder, recurrent, in full remission: Secondary | ICD-10-CM | POA: Diagnosis not present

## 2021-03-16 ENCOUNTER — Encounter: Payer: Self-pay | Admitting: Family Medicine

## 2021-03-18 ENCOUNTER — Ambulatory Visit
Admission: RE | Admit: 2021-03-18 | Discharge: 2021-03-18 | Disposition: A | Discharge status: Home / Self Care | Payer: BC Managed Care – PPO | Source: Ambulatory Visit | Attending: Family Medicine | Admitting: Family Medicine

## 2021-03-18 DIAGNOSIS — N6452 Nipple discharge: Secondary | ICD-10-CM | POA: Diagnosis not present

## 2021-03-18 DIAGNOSIS — R928 Other abnormal and inconclusive findings on diagnostic imaging of breast: Secondary | ICD-10-CM

## 2021-03-18 MED ORDER — GADOBUTROL 1 MMOL/ML IV SOLN
10.0000 mL | Freq: Once | INTRAVENOUS | Status: AC | PRN
  Administered 2021-03-18: 10 mL via INTRAVENOUS

## 2021-03-20 ENCOUNTER — Encounter: Payer: Self-pay | Admitting: Obstetrics and Gynecology

## 2021-03-20 ENCOUNTER — Other Ambulatory Visit (HOSPITAL_COMMUNITY)
Admission: RE | Admit: 2021-03-20 | Discharge: 2021-03-20 | Disposition: A | Discharge status: Home / Self Care | Payer: BC Managed Care – PPO | Source: Ambulatory Visit | Attending: Obstetrics and Gynecology | Admitting: Obstetrics and Gynecology

## 2021-03-20 ENCOUNTER — Ambulatory Visit: Payer: BC Managed Care – PPO | Admitting: Obstetrics and Gynecology

## 2021-03-20 ENCOUNTER — Other Ambulatory Visit: Payer: Self-pay

## 2021-03-20 VITALS — BP 160/82 | HR 83 | Ht 67.5 in | Wt 265.0 lb

## 2021-03-20 DIAGNOSIS — N950 Postmenopausal bleeding: Secondary | ICD-10-CM

## 2021-03-20 DIAGNOSIS — Z124 Encounter for screening for malignant neoplasm of cervix: Secondary | ICD-10-CM | POA: Insufficient documentation

## 2021-03-20 NOTE — Progress Notes (Signed)
GYNECOLOGY  VISIT   HPI: 49 y.o.   Married  Caucasian  female   [phone] with No LMP recorded. Patient is perimenopausal.   here for postmenopausal bleeding.  Started bleeding 03/08/21 with spotting.  Bleeding became more bright red and then developed a fiberous discharge.  Stopped bleeding on 03/17/21.  Some breast swelling.   Last spotting vaginally was 12/2019.  Prior [facility] and E2 indicated menopause on 10/26/20.  No hot flashes or night sweats.   Feeling tired.  Low vit D and vit B.   Daughter senior in high school.   GYNECOLOGIC HISTORY: No LMP recorded. Patient is perimenopausal. Contraception: Ablation/None--female partner Menopausal hormone therapy: none Last mammogram: 03-18-21 Bil.Br.MRI/probably benign/BiRads3/Rec Bil.Br.MRI 12 mo.to assess 2 years stability Last pap smear: 09-19-17 Neg:Neg HR HPV, 04-29-14 Neg:Neg HR HPV, 04-23-13 Neg        OB History     Gravida  3   Para  1   Term  1   Preterm      AB  2   Living  1      SAB  2   IAB      Ectopic      Multiple      Live Births                 Patient Active Problem List   Diagnosis Date Noted   Primary osteoarthritis of right knee 05/26/2018   Bilateral nipple discharge 09/19/2017   Primary localized osteoarthritis of left knee 07/22/2017   Primary osteoarthritis of left knee 07/22/2017   IDA (iron deficiency anemia) 05/20/2017   Effusion of left knee 05/17/2017   Degenerative arthritis of right knee 02/13/2017   Degenerative arthritis of left knee 02/13/2017   Numbness and tingling of both lower extremities 10/18/2016   Genetic testing 10/18/2016   Family history of breast cancer    Multinodular goiter 04/24/2015   Mood disorder ([place]) 04/12/2015   Peroneal mononeuropathy 08/22/2014   Left foot drop 07/21/2014   Menorrhagia with irregular cycle 04/29/2014   S/P gastric bypass 11/02/2013   Hyperlipidemia 10/08/2013   Primary localized osteoarthrosis, lower leg 07/06/2013   Obesity, BMI  unknown 04/23/2013   IUD contraception - inserted summer 2013 04/09/2013    Past Medical History:  Diagnosis Date   Anxiety and depression    Arthritis    Complication of anesthesia    has been hard to wake up post-op   Dental crowns present    Depression    Diabetes ([place])    resolved with gastric bypass, no meds   DVT of lower extremity (deep venous thrombosis) ([place]) 2012   LLE   Dysmenorrhea    hx endometriosis and fibroid   Elevated prolactin level 2021   Family history of breast cancer    Headache(784.0)    tension or sinus   History of MRSA infection prior to 2012   lasted 5-6 yr.   Hx of nipple discharge    Bilateral.  Long standing.  Negative work up.    Jaw snapping    states jaw pops   Left foot drop 2016   --[place] [facility]   Neuromuscular disorder ([place])    neuropathy in hands,feet,face   Neuropathy 2015   hands, feet and face--[place] [facility]   Prothrombin gene mutation ([place]) 2018   single gene mutation   Right knee pain    hx meniscal injury, chondromalacia, OA   Thyroid nodule     Past Surgical History:  Procedure Laterality Date   CHOLECYSTECTOMY  1990's   DILITATION & CURRETTAGE/HYSTROSCOPY WITH NOVASURE ABLATION N/A 05/30/2014   Procedure: DILATATION & CURETTAGE/HYSTEROSCOPY WITH attempted [place] and with IUD removal ;  Surgeon: Jamey Reas de Berton Lan, MD;  Location: [facility];  Service: Gynecology;  Laterality: N/A;   GASTRIC ROUX-EN-Y N/A 11/02/2013   Procedure: LAPAROSCOPIC ROUX-EN-Y GASTRIC BYPASS WITH UPPER ENDOSCOPY;  Surgeon: Gayland Curry, MD;  Location: [facility];  Service: General;  Laterality: N/A;   HAND SURGERY Left    X 3 - spider bite and MRSA infection   HYSTEROSCOPY WITH RESECTOSCOPE N/A 05/30/2014   Procedure: HYSTEROSCOPY WITH RESECTOSCOPE;  Surgeon: Jamey Reas de Berton Lan, MD;  Location: [facility];  Service: Gynecology;  Laterality: N/A;   KNEE ARTHROSCOPY Right 12/29/2012   Procedure: RIGHT KNEE  ARTHROSCOPY  PARTIAL MEDIAL AND LATERAL MENISECTOMY WITH CHONDROPLASTY;  Surgeon: Hessie Dibble, MD;  Location: [facility];  Service: Orthopedics;  Laterality: Right;   PELVIC LAPAROSCOPY  1990's   d/t endometriosis   TONSILLECTOMY  as child   TOTAL KNEE ARTHROPLASTY Left 07/22/2017   Procedure: TOTAL KNEE ARTHROPLASTY;  Surgeon: Melrose Nakayama, MD;  Location: [facility];  Service: Orthopedics;  Laterality: Left;   TOTAL KNEE ARTHROPLASTY Right 05/26/2018   Procedure: RIGHT TOTAL KNEE ARTHROPLASTY;  Surgeon: Melrose Nakayama, MD;  Location: [facility];  Service: Orthopedics;  Laterality: Right;   WRIST SURGERY Right 2005    Current Outpatient Medications  Medication Sig Dispense Refill   Acetaminophen (TYLENOL PO) Take 1 capsule by mouth every 8 (eight) hours.     alprazolam (XANAX) 2 MG tablet Take 2 mg by mouth 2 (two) times daily as needed.     DULoxetine (CYMBALTA) 60 MG capsule Take 120 mg by mouth every morning.      nystatin (MYCOSTATIN/NYSTOP) powder Apply 1 application topically 3 (three) times daily. Apply to affected area for up to 7 days 30 g 3   traZODone (DESYREL) 100 MG tablet Take 1 tablet by mouth at bedtime as needed.     No current facility-administered medications for this visit.     ALLERGIES: Bactrim [sulfamethoxazole-trimethoprim], Ibuprofen, Lamotrigine, and Sulfa antibiotics  Family History  Problem Relation Age of Onset   Diabetes Mother        Living   Kidney disease Mother    Hypertension Mother    Breast cancer Mother 18       dx'd with breast ca x2; 2nd around 57; 21rd 60's   Diabetes Maternal Grandmother 78       dec   Breast cancer Maternal Grandmother        dx in her 27s   Arthritis Father    Healthy Sister    Aneurysm Sister 53       brain   High blood pressure Sister    Diabetes Maternal Grandfather    Stroke Maternal Grandfather    Other Paternal Grandmother        died in childbirth   Other Paternal Grandfather        unknown    Healthy Daughter    Breast cancer Other        2 great aunts   Thyroid disease Neg Hx     Social History   Socioeconomic History   Marital status: Married    Spouse name: Not on file   Number of children: Not on file   Years of education: Not on file  Highest education level: Not on file  Occupational History   Not on file  Tobacco Use   Smoking status: Never   Smokeless tobacco: Never  Vaping Use   Vaping Use: Never used  Substance and Sexual Activity   Alcohol use: Yes    Alcohol/week: 4.0 - 6.0 standard drinks    Types: 4 - 6 Glasses of wine per week   Drug use: No   Sexual activity: Yes    Partners: Female    Comment: female partner  Other Topics Concern   Not on file  Social History Narrative   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  All other systems reviewed and are negative.  PHYSICAL EXAMINATION:    BP (!) 160/82   Pulse 83   Ht 5' 7.5" (1.715 m)   Wt 265 lb (120.2 kg)   SpO2 98%   BMI 40.89 kg/m     General appearance: alert, cooperative and appears stated age   Pelvic: External genitalia:  no lesions              Urethra:  normal appearing urethra with no masses, tenderness or lesions              Bartholins and Skenes: normal                 Vagina: normal appearing vagina with normal color and discharge, no lesions              Cervix: no lesions                Bimanual Exam:  Uterus:  normal size,  contour, position, consistency, mobility, non-tender              Adnexa: no mass, fullness, tenderness           Chaperone was present for exam:  Estill Bamberg, CMA  ASSESSMENT  Postmenopausal bleeding.  Cervical cancer screening.  Status post endometrial ablation.  Hx DVT.   PLAN  We discussed postmenopausal bleeding and etiologies - ?ovulation (Age 43), atrophy, polyps, ovarian cysts, cancer and precancer.  Check FSH and E2.  If postmenopausal, proceed with pelvic US, possible EMB.   An After Visit Summary was printed and given to the patient.

## 2021-03-20 NOTE — Patient Instructions (Signed)
Postmenopausal Bleeding Postmenopausal bleeding is any bleeding that a woman has after she has entered menopause. Menopause is the end of a woman's fertile years. After menopause, a woman no longer ovulates and does not have menstrual periods. Therefore, she should no longer have bleeding from her vagina. Postmenopausal bleeding may have various causes, including: Menopausal hormone therapy (MHT). Endometrial atrophy. After menopause, low estrogen hormone levels cause the membrane that lines the uterus (endometrium) to become thin. You may have bleeding as the endometrium thins. Endometrial hyperplasia. This condition is caused by excess estrogen hormones and low levels of progesterone hormones. The excess estrogen causes the endometrium to thicken, which can lead to bleeding. In some cases, this can lead to cancer of the uterus. Endometrial cancer. Noncancerous growths (polyps) on the endometrium, the lining of the uterus, or the cervix. Uterine fibroids. These are noncancerous growths in or around the uterus muscle tissue that can cause heavy bleeding. Any type of postmenopausal bleeding, even if it appears to be a typical menstrual period, should be checked by your health care provider. Treatment will depend on the cause of the bleeding. Follow these instructions at home:  Pay attention to any changes in your symptoms. Let your health care provider know about them. Avoid using tampons and douches as told by your health care provider. Change your pads regularly. Get regular pelvic exams, including Pap tests, as told by your health care provider. Take iron supplements as told by your health care provider. Take over-the-counter and prescription medicines only as told by your health care provider. Keep all follow-up visits. This is important. Contact a health care provider if: You have new bleeding from the vagina after menopause. You have pain in your abdomen. Get help right away if: You have  a fever or chills. You have severe pain with bleeding. You are passing blood clots. You have heavy bleeding, need more than 1 pad an hour, and have never experienced this before. You have headaches or feel faint or dizzy. Summary Postmenopausal bleeding is any bleeding that a woman has after she has entered into menopause. Postmenopausal bleeding may have various causes. Treatment will depend on the cause of the bleeding. Any type of postmenopausal bleeding, even if it appears to be a typical menstrual period, should be checked by your health care provider. Be sure to pay attention to any changes in your symptoms and keep all follow-up visits. This information is not intended to replace advice given to you by your health care provider. Make sure you discuss any questions you have with your health care provider. Document Revised: 01/13/2020 Document Reviewed: 01/13/2020 Elsevier Patient Education  2022 Elsevier Inc.  

## 2021-03-21 LAB — FOLLICLE STIMULATING HORMONE: FSH: 51.6 m[IU]/mL

## 2021-03-21 LAB — ESTRADIOL: Estradiol: <15 pg/mL

## 2021-03-22 LAB — CYTOLOGY - PAP
Comment: NEGATIVE
Diagnosis: UNDETERMINED — AB
High risk HPV: NEGATIVE

## 2021-03-23 ENCOUNTER — Ambulatory Visit
Admission: RE | Admit: 2021-03-23 | Discharge: 2021-03-23 | Disposition: A | Discharge status: Home / Self Care | Payer: BC Managed Care – PPO | Source: Ambulatory Visit | Attending: Family Medicine | Admitting: Family Medicine

## 2021-03-23 DIAGNOSIS — Z8249 Family history of ischemic heart disease and other diseases of the circulatory system: Secondary | ICD-10-CM

## 2021-03-23 DIAGNOSIS — Z8679 Personal history of other diseases of the circulatory system: Secondary | ICD-10-CM | POA: Diagnosis not present

## 2021-03-23 LAB — METHYLMALONIC ACID, SERUM: Methylmalonic Acid, Quant: 153 nmol/L (ref 87–318)

## 2021-03-23 LAB — HOMOCYSTEINE: Homocysteine: 12.1 umol/L — ABNORMAL HIGH (ref <10.4)

## 2021-03-23 MED ORDER — IOPAMIDOL (ISOVUE-370) INJECTION 76%
75.0000 mL | Freq: Once | INTRAVENOUS | Status: AC | PRN
  Administered 2021-03-23: 75 mL via INTRAVENOUS

## 2021-03-23 NOTE — Addendum Note (Signed)
Addended by: Yisroel Ramming, Dietrich Pates E on: 03/23/2021 08:32 AM   Modules accepted: Orders

## 2021-03-29 DIAGNOSIS — H40013 Open angle with borderline findings, low risk, bilateral: Secondary | ICD-10-CM | POA: Diagnosis not present

## 2021-04-12 ENCOUNTER — Other Ambulatory Visit: Payer: BC Managed Care – PPO

## 2021-04-12 ENCOUNTER — Other Ambulatory Visit: Payer: BC Managed Care – PPO | Admitting: Obstetrics and Gynecology

## 2021-04-17 ENCOUNTER — Ambulatory Visit (INDEPENDENT_AMBULATORY_CARE_PROVIDER_SITE_OTHER)
Admission: RE | Admit: 2021-04-17 | Discharge: 2021-04-17 | Disposition: A | Discharge status: Home / Self Care | Payer: BC Managed Care – PPO | Source: Ambulatory Visit | Attending: Internal Medicine | Admitting: Internal Medicine

## 2021-04-17 ENCOUNTER — Other Ambulatory Visit: Payer: Self-pay

## 2021-04-17 ENCOUNTER — Encounter: Payer: Self-pay | Admitting: Internal Medicine

## 2021-04-17 ENCOUNTER — Telehealth: Payer: Self-pay | Admitting: *Deleted

## 2021-04-17 ENCOUNTER — Ambulatory Visit: Payer: BC Managed Care – PPO | Admitting: Internal Medicine

## 2021-04-17 ENCOUNTER — Other Ambulatory Visit: Payer: Self-pay | Admitting: Internal Medicine

## 2021-04-17 ENCOUNTER — Other Ambulatory Visit (INDEPENDENT_AMBULATORY_CARE_PROVIDER_SITE_OTHER): Payer: BC Managed Care – PPO

## 2021-04-17 VITALS — BP 160/90 | HR 78 | Temp 98.4°F | Wt 261.8 lb

## 2021-04-17 DIAGNOSIS — R1012 Left upper quadrant pain: Secondary | ICD-10-CM | POA: Diagnosis not present

## 2021-04-17 DIAGNOSIS — R109 Unspecified abdominal pain: Secondary | ICD-10-CM | POA: Diagnosis not present

## 2021-04-17 LAB — COMPREHENSIVE METABOLIC PANEL
ALT: 26 U/L (ref 0–35)
AST: 24 U/L (ref 0–37)
Albumin: 4.2 g/dL (ref 3.5–5.2)
Alkaline Phosphatase: 125 U/L — ABNORMAL HIGH (ref 39–117)
BUN: 12 mg/dL (ref 6–23)
CO2: 28 mEq/L (ref 19–32)
Calcium: 9.2 mg/dL (ref 8.4–10.5)
Chloride: 103 mEq/L (ref 96–112)
Creatinine, Ser: 0.93 mg/dL (ref 0.40–1.20)
GFR: 71.58 mL/min (ref >60.00)
Glucose, Bld: 149 mg/dL — ABNORMAL HIGH (ref 70–99)
Potassium: 4.5 mEq/L (ref 3.5–5.1)
Sodium: 138 mEq/L (ref 135–145)
Total Bilirubin: 0.4 mg/dL (ref 0.2–1.2)
Total Protein: 7.4 g/dL (ref 6.0–8.3)

## 2021-04-17 LAB — CBC WITH DIFFERENTIAL/PLATELET
Basophils Absolute: 0.1 10*3/uL (ref 0.0–0.1)
Basophils Relative: 1.1 % (ref 0.0–3.0)
Eosinophils Absolute: 0.2 10*3/uL (ref 0.0–0.7)
Eosinophils Relative: 3.3 % (ref 0.0–5.0)
HCT: 42.1 % (ref 36.0–46.0)
Hemoglobin: 13.7 g/dL (ref 12.0–15.0)
Lymphocytes Relative: 30.7 % (ref 12.0–46.0)
Lymphs Abs: 2.2 10*3/uL (ref 0.7–4.0)
MCHC: 32.5 g/dL (ref 30.0–36.0)
MCV: 90.8 fl (ref 78.0–100.0)
Monocytes Absolute: 0.6 10*3/uL (ref 0.1–1.0)
Monocytes Relative: 8.3 % (ref 3.0–12.0)
Neutro Abs: 4.1 10*3/uL (ref 1.4–7.7)
Neutrophils Relative %: 56.6 % (ref 43.0–77.0)
Platelets: 325 10*3/uL (ref 150.0–400.0)
RBC: 4.63 Mil/uL (ref 3.87–5.11)
RDW: 13.7 % (ref 11.5–15.5)
WBC: 7.2 10*3/uL (ref 4.0–10.5)

## 2021-04-17 LAB — LIPASE: Lipase: 50 U/L (ref 11.0–59.0)

## 2021-04-17 NOTE — Telephone Encounter (Signed)
CRITICAL VALUE STICKER  CRITICAL VALUE:FINDINGS: Nonobstructive bowel-gas pattern. Possible wall thickening of some bowel loops in the left lower quadrant. No pneumatosis. No evidence of free air on upright imaging. Cholecystectomy clips. Spondylosis of the lumbar spine.   IMPRESSION: Possible wall thickening of some bowel loops in the left lower quadrant, finding could be further evaluated with contrast-enhanced CT of the abdomen and pelvis.  RECEIVER (on-site recipient of call):Lisha Vitale  DATE & TIME NOTIFIED: 04/13/21 1:41  MESSENGER (representative from lab):[facility]   MD NOTIFIED: Jerilee Hoh MD  TIME OF NOTIFICATION:04/17/21 1:42 pm  RESPONSE:  Dr Jerilee Hoh will respond in Chart.

## 2021-04-17 NOTE — Progress Notes (Signed)
Acute office Visit     This visit occurred during the SARS-CoV-2 public health emergency.  Safety protocols were in place, including screening questions prior to the visit, additional usage of staff PPE, and extensive cleaning of exam room while observing appropriate contact time as indicated for disinfecting solutions.    CC/Reason for Visit: Abdominal pain  HPI: [name] is a 50 y.o. female who is coming in today for the above mentioned reasons.  She is here today for evaluation of what she describes as acute, excruciating left upper quadrant abdominal pain.  She states she has had bouts of abdominal pain in this area recurrently for years.  However never as bad as this time.  Current pain started on Sunday, 2 days ago.  She felt "as if something burst" inside her belly.  She has not had a fever, she does describe some subjective chills, she denies nausea, vomiting, diarrhea.  She drinks alcohol occasionally.  She does state that last week she was on vacation and she did have more alcohol than usual, usually a glass or 2 of wine with dinner or a few beers.  She had her gallbladder out over 20 years ago.  8 years ago she had a Roux-en-Y gastric bypass.  Her last bowel movement was yesterday.  Past Medical/Surgical History: Past Medical History:  Diagnosis Date   Anxiety and depression    Arthritis    Complication of anesthesia    has been hard to wake up post-op   Dental crowns present    Depression    Diabetes ([place])    resolved with gastric bypass, no meds   DVT of lower extremity (deep venous thrombosis) ([place]) 2012   LLE   Dysmenorrhea    hx endometriosis and fibroid   Elevated prolactin level 2021   Family history of breast cancer    Headache(784.0)    tension or sinus   History of MRSA infection prior to 2012   lasted 5-6 yr.   Hx of nipple discharge    Bilateral.  Long standing.  Negative work up.    Jaw snapping    states jaw pops   Left foot drop 2016    --[place] [facility]   Neuromuscular disorder ([place])    neuropathy in hands,feet,face   Neuropathy 2015   hands, feet and face--[place] [facility]   Prothrombin gene mutation ([place]) 2018   single gene mutation   Right knee pain    hx meniscal injury, chondromalacia, OA   Thyroid nodule     Past Surgical History:  Procedure Laterality Date   CHOLECYSTECTOMY  1990's   DILITATION & CURRETTAGE/HYSTROSCOPY WITH NOVASURE ABLATION N/A 05/30/2014   Procedure: DILATATION & CURETTAGE/HYSTEROSCOPY WITH attempted [place] and with IUD removal ;  Surgeon: Jamey Reas de Berton Lan, MD;  Location: [facility];  Service: Gynecology;  Laterality: N/A;   GASTRIC ROUX-EN-Y N/A 11/02/2013   Procedure: LAPAROSCOPIC ROUX-EN-Y GASTRIC BYPASS WITH UPPER ENDOSCOPY;  Surgeon: Gayland Curry, MD;  Location[facility];  Service: General;  Laterality: N/A;   HAND SURGERY Left    X 3 - spider bite and MRSA infection   HYSTEROSCOPY WITH RESECTOSCOPE N/A 05/30/2014   Procedure: HYSTEROSCOPY WITH RESECTOSCOPE;  Surgeon: Jamey Reas de Berton Lan, MD;  Location: [facility];  Service: Gynecology;  Laterality: N/A;   KNEE ARTHROSCOPY Right 12/29/2012   Procedure: RIGHT KNEE ARTHROSCOPY  PARTIAL MEDIAL AND LATERAL MENISECTOMY WITH CHONDROPLASTY;  Surgeon: Monico Blitz  Rhona Raider, MD;  Location: [facility];  Service: Orthopedics;  Laterality: Right;   PELVIC LAPAROSCOPY  1990's   d/t endometriosis   TONSILLECTOMY  as child   TOTAL KNEE ARTHROPLASTY Left 07/22/2017   Procedure: TOTAL KNEE ARTHROPLASTY;  Surgeon: Melrose Nakayama, MD;  Location: [facility];  Service: Orthopedics;  Laterality: Left;   TOTAL KNEE ARTHROPLASTY Right 05/26/2018   Procedure: RIGHT TOTAL KNEE ARTHROPLASTY;  Surgeon: Melrose Nakayama, MD;  Location: [facility];  Service: Orthopedics;  Laterality: Right;   WRIST SURGERY Right 2005    Social History:  reports that she has never smoked. She has never used smokeless tobacco. She reports  current alcohol use of about 4.0 - 6.0 standard drinks per week. She reports that she does not use drugs.  Allergies: Allergies  Allergen Reactions   Bactrim [Sulfamethoxazole-Trimethoprim] Other (See Comments)    HALLUCINATIONS, FEVER, CHILLS   Ibuprofen Other (See Comments)    Had gastric bypass. Told not to take NSAIDs.   Lamotrigine Hives   Sulfa Antibiotics Other (See Comments)    HALLUCINATIONS, FEVER, CHILLS      Family History:  Family History  Problem Relation Age of Onset   Diabetes Mother        Living   Kidney disease Mother    Hypertension Mother    Breast cancer Mother 27       dx'd with breast ca x2; 2nd around 38; 61rd 60's   Diabetes Maternal Grandmother 63       dec   Breast cancer Maternal Grandmother        dx in her 83s   Arthritis Father    Healthy Sister    Aneurysm Sister 33       brain   High blood pressure Sister    Diabetes Maternal Grandfather    Stroke Maternal Grandfather    Other Paternal Grandmother        died in childbirth   Other Paternal Grandfather        unknown   Healthy Daughter    Breast cancer Other        2 great aunts   Thyroid disease Neg Hx      Current Outpatient Medications:    Acetaminophen (TYLENOL PO), Take 1 capsule by mouth every 8 (eight) hours., Disp: , Rfl:    alprazolam (XANAX) 2 MG tablet, Take 2 mg by mouth 2 (two) times daily as needed., Disp: , Rfl:    DULoxetine (CYMBALTA) 60 MG capsule, Take 120 mg by mouth every morning. , Disp: , Rfl:    nystatin (MYCOSTATIN/NYSTOP) powder, Apply 1 application topically 3 (three) times daily. Apply to affected area for up to 7 days, Disp: 30 g, Rfl: 3   traZODone (DESYREL) 100 MG tablet, Take 1 tablet by mouth at bedtime as needed., Disp: , Rfl:   Review of Systems:  Constitutional: Denies fever, diaphoresis, appetite change and fatigue.  HEENT: Denies photophobia, eye pain, redness, hearing loss, ear pain, congestion, sore throat, rhinorrhea, sneezing, mouth  sores, trouble swallowing, neck pain, neck stiffness and tinnitus.   Respiratory: Denies SOB, DOE, cough, chest tightness,  and wheezing.   Cardiovascular: Denies chest pain, palpitations and leg swelling.  Gastrointestinal: Denies nausea, vomiting,  diarrhea, constipation, blood in stool and abdominal distention.  Genitourinary: Denies dysuria, urgency, frequency, hematuria, flank pain and difficulty urinating.  Endocrine: Denies: hot or cold intolerance, sweats, changes in hair or nails, polyuria, polydipsia. Musculoskeletal: Denies myalgias, back pain, joint swelling, arthralgias and  gait problem.  Skin: Denies pallor, rash and wound.  Neurological: Denies dizziness, seizures, syncope, weakness, light-headedness, numbness and headaches.  Hematological: Denies adenopathy. Easy bruising, personal or family bleeding history  Psychiatric/Behavioral: Denies suicidal ideation, mood changes, confusion, nervousness, sleep disturbance and agitation    Physical Exam: Vitals:   04/17/21 1139  BP: (!) 160/90  Pulse: 78  Temp: 98.4 F (36.9 C)  TempSrc: Oral  SpO2: 97%  Weight: 261 lb 12.8 oz (118.8 kg)    Body mass index is 40.4 kg/m.   Constitutional: NAD, calm, comfortable, significant left upper quadrant pain when asked to march in place. Eyes: PERRL, lids and conjunctivae normal wears corrective lenses ENMT: Mucous membranes are moist.  Respiratory: clear to auscultation bilaterally, no wheezing, no crackles. Normal respiratory effort. No accessory muscle use.  Cardiovascular: Regular rate and rhythm, no murmurs / rubs / gallops. No extremity edema.   Abdomen: Positive bowel sounds, no noted organomegaly, no rebound, no guarding, tenderness only to deep palpation of the left upper quadrant.  She does have an unexplainable large bruise in her suprapubic abdominal area, below her umbilicus.  She does not recall any injuries. Neurologic: Grossly intact and nonfocal Psychiatric: Normal  judgment and insight. Alert and oriented x 3. Normal mood.    Impression and Plan:  LUQ pain  - Plan: DG Abd 2 Views, CBC with Differential/Platelet, Comprehensive metabolic panel, Lipase -Etiology remains unclear to me at this time.  Certainly pancreatitis is a possibility especially with her increased alcohol intake over the past week although it does not seem excessive.  She has had a cholecystectomy.  Partial SBO is a consideration as well.  I would think that a perforated abdominal viscus would have a more significant abdominal exam.  Although she does have some mild peritoneal signs such as pain when marching in place or walking on tiptoes, her abdominal exam is benign and certainly not indicative of an acute abdomen. -I will order an abdominal plain film, stat labs and will follow results.  She knows to proceed to the emergency department with acute worsening of pain.  May need CT abdomen pending results.  Time spent: 31 minutes reviewing chart, interviewing and examining patient and formulating plan of care.   Patient Instructions  -Nice seeing you today!!  -Lab work today; will notify you once results are available.  -Abdominal xray today.  -We will contact you for these results. If pain significantly worsens, consider ED evaluation.   Lelon Frohlich, MD [facility] at [facility]

## 2021-04-17 NOTE — Patient Instructions (Addendum)
-  Nice seeing you today!!  -Lab work today; will notify you once results are available.  -Abdominal xray today.  -We will contact you for these results. If pain significantly worsens, consider ED evaluation.

## 2021-04-18 ENCOUNTER — Ambulatory Visit (INDEPENDENT_AMBULATORY_CARE_PROVIDER_SITE_OTHER)
Admission: RE | Admit: 2021-04-18 | Discharge: 2021-04-18 | Disposition: A | Discharge status: Home / Self Care | Payer: BC Managed Care – PPO | Source: Ambulatory Visit | Attending: Internal Medicine | Admitting: Internal Medicine

## 2021-04-18 ENCOUNTER — Encounter: Payer: Self-pay | Admitting: Internal Medicine

## 2021-04-18 DIAGNOSIS — R1012 Left upper quadrant pain: Secondary | ICD-10-CM | POA: Diagnosis not present

## 2021-04-18 DIAGNOSIS — R109 Unspecified abdominal pain: Secondary | ICD-10-CM | POA: Diagnosis not present

## 2021-04-18 MED ORDER — IOHEXOL 300 MG/ML  SOLN
100.0000 mL | Freq: Once | INTRAMUSCULAR | Status: AC | PRN
  Administered 2021-04-18: 100 mL via INTRAVENOUS

## 2021-04-19 ENCOUNTER — Other Ambulatory Visit: Payer: Self-pay | Admitting: Family Medicine

## 2021-04-19 ENCOUNTER — Telehealth: Payer: Self-pay

## 2021-04-19 MED ORDER — SUCRALFATE 1 G PO TABS: 1.0000 g | ORAL_TABLET | Freq: Three times a day (TID) | ORAL | 2 refills | Status: DC

## 2021-04-19 MED ORDER — PANTOPRAZOLE SODIUM 40 MG PO TBEC: 40.0000 mg | DELAYED_RELEASE_TABLET | Freq: Every day | ORAL | 2 refills | Status: DC

## 2021-04-19 NOTE — Telephone Encounter (Signed)
Patient called stating she was seen by another provider while Dr. Ethlyn Gallery was out of the office and that she is still in pain and needs advice on what she should do.

## 2021-04-19 NOTE — Telephone Encounter (Signed)
Spoke with the patient and informed her of the message below as she read the previous Mychart messages.  Per Dr Jerilee Hoh, I informed the patient if she is in so much pain that she cannot await the referral that was placed to GI, she may want to go to an urgent care or the emergency room for evaluation and she agreed.

## 2021-04-26 ENCOUNTER — Encounter: Payer: Self-pay | Admitting: Obstetrics and Gynecology

## 2021-04-26 ENCOUNTER — Other Ambulatory Visit: Payer: Self-pay

## 2021-04-26 ENCOUNTER — Ambulatory Visit: Payer: BC Managed Care – PPO | Admitting: Obstetrics and Gynecology

## 2021-04-26 ENCOUNTER — Ambulatory Visit (INDEPENDENT_AMBULATORY_CARE_PROVIDER_SITE_OTHER): Payer: BC Managed Care – PPO

## 2021-04-26 ENCOUNTER — Other Ambulatory Visit (HOSPITAL_COMMUNITY)
Admission: RE | Admit: 2021-04-26 | Discharge: 2021-04-26 | Disposition: A | Discharge status: Home / Self Care | Payer: BC Managed Care – PPO | Source: Ambulatory Visit | Attending: Obstetrics and Gynecology | Admitting: Obstetrics and Gynecology

## 2021-04-26 DIAGNOSIS — N950 Postmenopausal bleeding: Secondary | ICD-10-CM

## 2021-04-26 NOTE — Progress Notes (Signed)
GYNECOLOGY  VISIT   HPI: 50 y.o.   Married  Caucasian  female   [phone] with No LMP recorded. Patient is perimenopausal.   here for   pelvic ultrasound and endometrial biopsy for postmenopausal bleeding.   Bled from 03/08/21 - 03/17/21.   [place] 51.6 and E2< 15 on 03/20/21.   Episode of significant LUQ pain.  Had a CT done and it showed redundant large bowel.  Will see Dr. Carlean Purl in GI.   GYNECOLOGIC HISTORY: No LMP recorded. Patient is perimenopausal. Contraception:  none-- female partner Menopausal hormone therapy:  none Last mammogram:  03-18-21 density B/BIRADS 3 probably benign  Last pap smear:   03-20-21 ASCUS, HR HPV negative         OB History     Gravida  3   Para  1   Term  1   Preterm      AB  2   Living  1      SAB  2   IAB      Ectopic      Multiple      Live Births                 Patient Active Problem List   Diagnosis Date Noted   Primary osteoarthritis of right knee 05/26/2018   Bilateral nipple discharge 09/19/2017   Primary localized osteoarthritis of left knee 07/22/2017   Primary osteoarthritis of left knee 07/22/2017   IDA (iron deficiency anemia) 05/20/2017   Effusion of left knee 05/17/2017   Degenerative arthritis of right knee 02/13/2017   Degenerative arthritis of left knee 02/13/2017   Numbness and tingling of both lower extremities 10/18/2016   Genetic testing 10/18/2016   Family history of breast cancer    Multinodular goiter 04/24/2015   Mood disorder ([place]) 04/12/2015   Peroneal mononeuropathy 08/22/2014   Left foot drop 07/21/2014   Menorrhagia with irregular cycle 04/29/2014   S/P gastric bypass 11/02/2013   Hyperlipidemia 10/08/2013   Primary localized osteoarthrosis, lower leg 07/06/2013   Obesity, BMI unknown 04/23/2013   IUD contraception - inserted summer 2013 04/09/2013    Past Medical History:  Diagnosis Date   Anxiety and depression    Arthritis    Complication of anesthesia    has been hard to wake up  post-op   Dental crowns present    Depression    Diabetes ([place])    resolved with gastric bypass, no meds   DVT of lower extremity (deep venous thrombosis) ([place]) 2012   LLE   Dysmenorrhea    hx endometriosis and fibroid   Elevated prolactin level 2021   Family history of breast cancer    Headache(784.0)    tension or sinus   History of MRSA infection prior to 2012   lasted 5-6 yr.   Hx of nipple discharge    Bilateral.  Long standing.  Negative work up.    Jaw snapping    states jaw pops   Left foot drop 2016   --[facility]   Neuromuscular disorder ([place])    neuropathy in hands,feet,face   Neuropathy 2015   hands, feet and face--[place] [facility]   Prothrombin gene mutation ([place]) 2018   single gene mutation   Right knee pain    hx meniscal injury, chondromalacia, OA   Thyroid nodule     Past Surgical History:  Procedure Laterality Date   CHOLECYSTECTOMY  1990's   DILITATION & CURRETTAGE/HYSTROSCOPY WITH NOVASURE ABLATION N/A 05/30/2014  Procedure: DILATATION & CURETTAGE/HYSTEROSCOPY WITH attempted [place] and with IUD removal ;  Surgeon: Jamey Reas de Berton Lan, MD;  Location: [facility];  Service: Gynecology;  Laterality: N/A;   GASTRIC ROUX-EN-Y N/A 11/02/2013   Procedure: LAPAROSCOPIC ROUX-EN-Y GASTRIC BYPASS WITH UPPER ENDOSCOPY;  Surgeon: Gayland Curry, MD;  Location: [facility];  Service: General;  Laterality: N/A;   HAND SURGERY Left    X 3 - spider bite and MRSA infection   HYSTEROSCOPY WITH RESECTOSCOPE N/A 05/30/2014   Procedure: HYSTEROSCOPY WITH RESECTOSCOPE;  Surgeon: Jamey Reas de Berton Lan, MD;  Location: [facility];  Service: Gynecology;  Laterality: N/A;   KNEE ARTHROSCOPY Right 12/29/2012   Procedure: RIGHT KNEE ARTHROSCOPY  PARTIAL MEDIAL AND LATERAL MENISECTOMY WITH CHONDROPLASTY;  Surgeon: Hessie Dibble, MD;  Location: [facility];  Service: Orthopedics;  Laterality: Right;   PELVIC LAPAROSCOPY  1990's    d/t endometriosis   TONSILLECTOMY  as child   TOTAL KNEE ARTHROPLASTY Left 07/22/2017   Procedure: TOTAL KNEE ARTHROPLASTY;  Surgeon: Melrose Nakayama, MD;  Location: [facility];  Service: Orthopedics;  Laterality: Left;   TOTAL KNEE ARTHROPLASTY Right 05/26/2018   Procedure: RIGHT TOTAL KNEE ARTHROPLASTY;  Surgeon: Melrose Nakayama, MD;  Location: [facility];  Service: Orthopedics;  Laterality: Right;   WRIST SURGERY Right 2005    Current Outpatient Medications  Medication Sig Dispense Refill   Acetaminophen (TYLENOL PO) Take 1 capsule by mouth every 8 (eight) hours.     alprazolam (XANAX) 2 MG tablet Take 2 mg by mouth 2 (two) times daily as needed.     DULoxetine (CYMBALTA) 60 MG capsule Take 120 mg by mouth every morning.      nystatin (MYCOSTATIN/NYSTOP) powder Apply 1 application topically 3 (three) times daily. Apply to affected area for up to 7 days 30 g 3   pantoprazole (PROTONIX) 40 MG tablet Take 1 tablet (40 mg total) by mouth daily. 30 tablet 2   sucralfate (CARAFATE) 1 g tablet Take 1 tablet (1 g total) by mouth 4 (four) times daily -  with meals and at bedtime. 120 tablet 2   traZODone (DESYREL) 100 MG tablet Take 1 tablet by mouth at bedtime as needed.     No current facility-administered medications for this visit.     ALLERGIES: Bactrim [sulfamethoxazole-trimethoprim], Ibuprofen, Lamotrigine, and Sulfa antibiotics  Family History  Problem Relation Age of Onset   Diabetes Mother        Living   Kidney disease Mother    Hypertension Mother    Breast cancer Mother 10       dx'd with breast ca x2; 2nd around 60; 43rd 60's   Diabetes Maternal Grandmother 10       dec   Breast cancer Maternal Grandmother        dx in her 69s   Arthritis Father    Healthy Sister    Aneurysm Sister 5       brain   High blood pressure Sister    Diabetes Maternal Grandfather    Stroke Maternal Grandfather    Other Paternal Grandmother        died in childbirth   Other Paternal Grandfather         unknown   Healthy Daughter    Breast cancer Other        2 great aunts   Thyroid disease Neg Hx     Social History   Socioeconomic History  Marital status: Married    Spouse name: Not on file   Number of children: Not on file   Years of education: Not on file   Highest education level: Not on file  Occupational History   Not on file  Tobacco Use   Smoking status: Never   Smokeless tobacco: Never  Vaping Use   Vaping Use: Never used  Substance and Sexual Activity   Alcohol use: Yes    Alcohol/week: 4.0 - 6.0 standard drinks    Types: 4 - 6 Glasses of wine per week   Drug use: No   Sexual activity: Yes    Partners: Female    Comment: female partner  Other Topics Concern   Not on file  Social History Narrative   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  PHYSICAL EXAMINATION:    BP (!) 152/88 (BP Location: Left Arm, Patient Position: Sitting, Cuff Size: Normal)   Pulse 72   Resp 14   Ht '5\' 8"'$  (1.727 m)   Wt 263 lb (119.3 kg)   BMI 39.99 kg/m     General appearance: alert, cooperative and appears stated age   Pelvic US Uterus 5.83 x 3.72 x 3.18 cm.  1.08 cm intramural fibroid EMS 2.38 mm, symmetrical.  Right ovary atrophic.  Left ovary not visualized due to obscuring bowel.  No adnexal masses.  No free fluid.  EMB Consent done.  Hibiclen prep. Local paracervical block with 1% lidocaine [id number], exp 11/2022 Tenaculum  to anterior cervical lip.  Pipelle to almost 6 cm x 2.  Tissue to pathology.  No complications.  Minimal EBL.  Chaperone was present for exam:  Raquel Sarna, RN  ASSESSMENT  Postmenopausal bleeding.  Hx Novasure endometrial ablation 2015.   PLAN  [place] images and report reviewed with patient.  FU EMB result.  Final plan to follow.    An After Visit Summary was printed and given to the patient.

## 2021-04-26 NOTE — Patient Instructions (Signed)
Endometrial Biopsy An endometrial biopsy is a procedure to remove tissue samples from the endometrium, which is the lining of the uterus. The tissue that is removed can then be checked under a microscope for disease. This procedure is used to diagnose conditions such as endometrial cancer, endometrial tuberculosis, polyps, or other inflammatory conditions. This procedure may also be used to investigate uterine bleeding to determine where you are in your menstrual cycle or how your hormone levels are affecting the lining of the uterus. Tell a health care provider about: Any allergies you have. All medicines you are taking, including vitamins, herbs, eye drops, creams, and over-the-counter medicines. Any problems you or family members have had with anesthetic medicines. Any blood disorders you have. Any surgeries you have had. Any medical conditions you have. Whether you are pregnant or may be pregnant. What are the risks? Generally, this is a safe procedure. However, problems may occur, including: Bleeding. Pelvic infection. Puncture of the wall of the uterus with the biopsy device (rare). Allergic reactions to medicines. What happens before the procedure? Keep a record of your menstrual cycles as told by your health care provider. You may need to schedule your procedure for a specific time in your cycle. You may want to bring a sanitary pad to wear after the procedure. Plan to have someone take you home from the hospital or clinic. Ask your health care provider about: Changing or stopping your regular medicines. This is especially important if you are taking diabetes medicines, arthritis medicines, or blood thinners. Taking medicines such as aspirin and ibuprofen. These medicines can thin your blood. Do not take these medicines unless your health care provider tells you to take them. Taking over-the-counter medicines, vitamins, herbs, and supplements. What happens during the procedure? You  will lie on an exam table with your feet and legs supported as in a pelvic exam. Your health care provider will insert an instrument (speculum) into your vagina to see your cervix. Your cervix will be cleansed with an antiseptic solution. A medicine (local anesthetic) will be used to numb the cervix. A forceps instrument (tenaculum) will be used to hold your cervix steady for the biopsy. A thin, rod-like instrument (uterine sound) will be inserted through your cervix to determine the length of your uterus and the location where the biopsy sample will be removed. A thin, flexible tube (catheter) will be inserted through your cervix and into the uterus. The catheter will be used to collect the biopsy sample from your endometrial tissue. The catheter and speculum will then be removed, and the tissue sample will be sent to a lab for examination. The procedure may vary among health care providers and hospitals. What can I expect after procedure? You will rest in a recovery area until you are ready to go home. You may have mild cramping and a small amount of vaginal bleeding. This is normal. You may have a small amount of vaginal bleeding for a few days. This is normal. It is up to you to get the results of your procedure. Ask your health care provider, or the department that is doing the procedure, when your results will be ready. Follow these instructions at home: Take over-the-counter and prescription medicines only as told by your health care provider. Do not douche, use tampons, or have sexual intercourse until your health care provider approves. Return to your normal activities as told by your health care provider. Ask your health care provider what activities are safe for you. Follow instructions   from your health care provider about any activity restrictions, such as restrictions on strenuous exercise or heavy lifting. Keep all follow-up visits. This is important. Contact a health care  provider: You have heavy bleeding, or bleed for longer than 2 days after the procedure. You have bad smelling discharge from your vagina. You have a fever or chills. You have a burning sensation when urinating or you have difficulty urinating. You have severe pain in your lower abdomen. Get help right away if you: You have severe cramps in your stomach or back. You pass large blood clots. Your bleeding increases. You become weak or light-headed, or you faint or lose consciousness. Summary An endometrial biopsy is a procedure to remove tissue samples is taken from the endometrium, which is the lining of the uterus. The tissue sample that is removed will be checked under a microscope for disease. This procedure is used to diagnose conditions such as endometrial cancer, endometrial tuberculosis, polyps, or other inflammatory conditions. After the procedure, it is common to have mild cramping and a small amount of vaginal bleeding for a few days. Do not douche, use tampons, or have sexual intercourse until your health care provider approves. Ask your health care provider which activities are safe for you. This information is not intended to replace advice given to you by your health care provider. Make sure you discuss any questions you have with your health care provider. Document Revised: 02/21/2020 Document Reviewed: 02/21/2020 Elsevier Patient Education  2022 Elsevier Inc.  

## 2021-04-30 LAB — SURGICAL PATHOLOGY

## 2021-05-17 ENCOUNTER — Encounter: Payer: Self-pay | Admitting: Physician Assistant

## 2021-05-17 ENCOUNTER — Ambulatory Visit: Payer: BC Managed Care – PPO | Admitting: Physician Assistant

## 2021-05-17 VITALS — BP 140/70 | HR 117 | Ht 68.0 in | Wt 262.2 lb

## 2021-05-17 DIAGNOSIS — R1012 Left upper quadrant pain: Secondary | ICD-10-CM

## 2021-05-17 NOTE — Patient Instructions (Signed)
You have been scheduled for a colonoscopy. Please follow written instructions given to you at your visit today.  Please pick up your prep supplies at the pharmacy within the next 1-3 days. If you use inhalers (even only as needed), please bring them with you on the day of your procedure.  If you are age 51 or older, your body mass index should be between 23-30. Your Body mass index is 39.87 kg/m. If this is out of the aforementioned range listed, please consider follow up with your Primary Care Provider.  If you are age 51 or younger, your body mass index should be between 19-25. Your Body mass index is 39.87 kg/m. If this is out of the aformentioned range listed, please consider follow up with your Primary Care Provider.   __________________________________________________________  The [facility] providers would like to encourage you to use [facility] to communicate with providers for non-urgent requests or questions.  Due to long hold times on the telephone, sending your provider a message by [facility] may be a faster and more efficient way to get a response.  Please allow 48 business hours for a response.  Please remember that this is for non-urgent requests.

## 2021-05-17 NOTE — Progress Notes (Signed)
Chief Complaint: Abdominal pain  HPI:    [name] is a 50 year old female with a past medical history of anxiety, depression, DVT, diabetes and multiple others listed below, known to Dr. Carlean Purl, who was referred to me by Caren Macadam, MD for a complaint of abdominal pain.    04/10/2017 colonoscopy for change in bowel habits was normal.  Repeat recommended in 10 years.  Patient was started on Dicyclomine.    04/10/2017 EGD for left upper quadrant abdominal pain was normal.    04/17/2021 abdominal x-ray with possible wall thickening of some bowel loops in the left lower quadrant, CT recommended.    04/17/2021 CBC, lipase normal.  CMP with minimally elevated alk phos at 125 and glucose elevated 149.    04/18/2021 CT of the abdomen pelvis with contrast for left upper quadrant pain with no acute or inflammatory process.  Redundant large bowel with retained low-density stool throughout, chronic Roux-en-Y type gastric bypass with no adverse features and aortic atherosclerosis.    Today, the patient presents to clinic and explains that she has always had some chronic left upper quadrant pain which seems to come and go, even when she was seen by Dr. Carlean Purl in 2018 for which she had evaluation as above.  Tells me she tried the Dicyclomine for a few days but this was not making any difference and she discontinued it due to reading the side effects.  Tells me these pains/spasms will hit once a month or so, but seem to go away within a few days to the point where she cannot feel it anymore.  Tells me though that she had an acute episode of left upper quadrant pain that started as spasms that were so intense rated as a 9-10/10 that awoke her from her sleep at about 4:00 in the morning and increased in intensity to the point where she could not function.  She had CT as above with no etiology and after about a week and a half the acute severity decreased and now she just feels a dull pain in this area which is  constant.  Tells me she really had no change in bowel habits over this time.  Does describe occasional days of diarrhea but this is "normal for me".  Tells me there was no blood in her stool.  Interestingly also reports that she developed bruising across her abdomen just superior to her umbilicus later that morning as well.  Tells me she had clams to eat the night before but eats a lot of shellfish all the time and it never bothers her.    Denies fever, chills, blood in her stool, nausea, vomiting, heartburn or reflux.  Past Medical History:  Diagnosis Date   Anxiety and depression    Arthritis    Complication of anesthesia    has been hard to wake up post-op   Dental crowns present    Depression    Diabetes ([place])    resolved with gastric bypass, no meds   DVT of lower extremity (deep venous thrombosis) ([place]) 2012   LLE   Dysmenorrhea    hx endometriosis and fibroid   Elevated prolactin level 2021   Family history of breast cancer    Headache(784.0)    tension or sinus   History of MRSA infection prior to 2012   lasted 5-6 yr.   Hx of nipple discharge    Bilateral.  Long standing.  Negative work up.    Jaw snapping  states jaw pops   Left foot drop 2016   --[facility]   Neuromuscular disorder ([place])    neuropathy in hands,feet,face   Neuropathy 2015   hands, feet and face-- neurology   Prothrombin gene mutation ([place]) 2018   single gene mutation   Right knee pain    hx meniscal injury, chondromalacia, OA   Thyroid nodule     Past Surgical History:  Procedure Laterality Date   CHOLECYSTECTOMY  1990's   DILITATION & CURRETTAGE/HYSTROSCOPY WITH NOVASURE ABLATION N/A 05/30/2014   Procedure: DILATATION & CURETTAGE/HYSTEROSCOPY WITH attempted [place] and with IUD removal ;  Surgeon: Jamey Reas de Berton Lan, MD;  Location: [facility];  Service: Gynecology;  Laterality: N/A;   GASTRIC ROUX-EN-Y N/A 11/02/2013   Procedure: LAPAROSCOPIC ROUX-EN-Y  GASTRIC BYPASS WITH UPPER ENDOSCOPY;  Surgeon: Gayland Curry, MD;  Location: [facility];  Service: General;  Laterality: N/A;   HAND SURGERY Left    X 3 - spider bite and MRSA infection   HYSTEROSCOPY WITH RESECTOSCOPE N/A 05/30/2014   Procedure: HYSTEROSCOPY WITH RESECTOSCOPE;  Surgeon: Jamey Reas de Berton Lan, MD;  Location: [facility];  Service: Gynecology;  Laterality: N/A;   KNEE ARTHROSCOPY Right 12/29/2012   Procedure: RIGHT KNEE ARTHROSCOPY  PARTIAL MEDIAL AND LATERAL MENISECTOMY WITH CHONDROPLASTY;  Surgeon: Hessie Dibble, MD;  Location: [facility];  Service: Orthopedics;  Laterality: Right;   PELVIC LAPAROSCOPY  1990's   d/t endometriosis   TONSILLECTOMY  as child   TOTAL KNEE ARTHROPLASTY Left 07/22/2017   Procedure: TOTAL KNEE ARTHROPLASTY;  Surgeon: Melrose Nakayama, MD;  Location: [facility];  Service: Orthopedics;  Laterality: Left;   TOTAL KNEE ARTHROPLASTY Right 05/26/2018   Procedure: RIGHT TOTAL KNEE ARTHROPLASTY;  Surgeon: Melrose Nakayama, MD;  Location: [facility];  Service: Orthopedics;  Laterality: Right;   WRIST SURGERY Right 2005    Current Outpatient Medications  Medication Sig Dispense Refill   Acetaminophen (TYLENOL PO) Take 1 capsule by mouth every 8 (eight) hours.     alprazolam (XANAX) 2 MG tablet Take 2 mg by mouth 2 (two) times daily as needed.     DULoxetine (CYMBALTA) 60 MG capsule Take 120 mg by mouth every morning.      nystatin (MYCOSTATIN/NYSTOP) powder Apply 1 application topically 3 (three) times daily. Apply to affected area for up to 7 days 30 g 3   traZODone (DESYREL) 100 MG tablet Take 1 tablet by mouth at bedtime as needed.     No current facility-administered medications for this visit.    Allergies as of 05/17/2021 - Review Complete 05/17/2021  Allergen Reaction Noted   Bactrim [sulfamethoxazole-trimethoprim] Other (See Comments) 10/26/2013   Ibuprofen Other (See Comments) 05/19/2015   Lamotrigine Hives 05/26/2012   Sulfa  antibiotics Other (See Comments) 09/22/2012    Family History  Problem Relation Age of Onset   Diabetes Mother        Living   Kidney disease Mother    Hypertension Mother    Breast cancer Mother 28       dx'd with breast ca x2; 2nd around 35; 3rd 60's   Diabetes Maternal Grandmother 22       dec   Breast cancer Maternal Grandmother        dx in her 95s   Arthritis Father    Healthy Sister    Aneurysm Sister 70       brain   High blood pressure Sister  Diabetes Maternal Grandfather    Stroke Maternal Grandfather    Other Paternal Grandmother        died in childbirth   Other Paternal Grandfather        unknown   Healthy Daughter    Breast cancer Other        2 great aunts   Thyroid disease Neg Hx     Social History   Socioeconomic History   Marital status: Married    Spouse name: Not on file   Number of children: Not on file   Years of education: Not on file   Highest education level: Not on file  Occupational History   Not on file  Tobacco Use   Smoking status: Never   Smokeless tobacco: Never  Vaping Use   Vaping Use: Never used  Substance and Sexual Activity   Alcohol use: Yes    Alcohol/week: 4.0 - 6.0 standard drinks    Types: 4 - 6 Glasses of wine per week   Drug use: No   Sexual activity: Yes    Partners: Female    Comment: female partner  Other Topics Concern   Not on file  Social History Narrative   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, jewellery level of education:  Masters      Home Situation: lives with daughter      Spiritual Beliefs: none      Lifestyle: no regular exercising; diet is so so      Caffeine use: daily            Social Determinants of Radio broadcast assistant Strain: Not on file  Food Insecurity: Not on file  Transportation Needs: Not on file  Physical Activity: Not on file  Stress: Not on file  Social Connections: Not on file  Intimate Partner Violence: Not on file    Review of Systems:     Constitutional: No weight loss, fever or chills Skin: No rash  Cardiovascular: No chest pain Respiratory: No SOB  Gastrointestinal: See HPI and otherwise negative Genitourinary: No dysuria or change in urinary frequency Neurological: No headache, dizziness or syncope Musculoskeletal: No new muscle or joint pain Hematologic: No bleeding  Psychiatric: No history of depression or anxiety   Physical Exam:  Vital signs: BP 140/70   Pulse (!) 117   Ht '5\' 8"'  (1.727 m)   Wt 262 lb 4 oz (119 kg)   BMI 39.87 kg/m    Constitutional:   Pleasant obese Caucasian female appears to be in NAD, Well developed, Well nourished, alert and cooperative Head:  Normocephalic and atraumatic. Eyes:   PEERL, EOMI. No icterus. Conjunctiva pink. Ears:  Normal auditory acuity. Neck:  Supple Throat: Oral cavity and pharynx without inflammation, swelling or lesion.  Respiratory: Respirations even and unlabored. Lungs clear to auscultation bilaterally.   No wheezes, crackles, or rhonchi.  Cardiovascular: Normal S1, S2. No MRG. Regular rate and rhythm. No peripheral edema, cyanosis or pallor.  Gastrointestinal:  Soft, nondistended, moderate LUQ ttp No rebound or guarding. Normal bowel sounds. No appreciable masses or hepatomegaly. Rectal:  Not performed.  Msk:  Symmetrical without gross deformities. Without edema, no deformity or joint abnormality.  Neurologic:  Alert and  oriented x4;  grossly normal neurologically.  Skin:   Dry and intact without significant lesions or rashes. Psychiatric: Oriented to person, place and time. Demonstrates good judgement and reason without abnormal affect or behaviors.  RELEVANT LABS AND IMAGING: CBC  Component Value Date/Time   WBC 7.2 04/17/2021 1255   RBC 4.63 04/17/2021 1255   HGB 13.7 04/17/2021 1255   HGB 13.6 10/29/2017 1335   HGB 13.3 09/19/2017 1650   HGB 13.0 05/14/2017 1054   HCT 42.1 04/17/2021 1255   HCT 42.1 09/19/2017 1650   HCT 39.8 05/14/2017 1054    PLT 325.0 04/17/2021 1255   PLT 371 10/29/2017 1335   PLT 380 (H) 09/19/2017 1650   MCV 90.8 04/17/2021 1255   MCV 91 09/19/2017 1650   MCV 95 05/14/2017 1054   MCH 30.4 07/05/2019 0915   MCHC 32.5 04/17/2021 1255   RDW 13.7 04/17/2021 1255   RDW 13.7 09/19/2017 1650   RDW 12.9 05/14/2017 1054   LYMPHSABS 2.2 04/17/2021 1255   LYMPHSABS 1.8 05/14/2017 1054   MONOABS 0.6 04/17/2021 1255   EOSABS 0.2 04/17/2021 1255   EOSABS 0.3 05/14/2017 1054   BASOSABS 0.1 04/17/2021 1255   BASOSABS 0.1 05/14/2017 1054    CMP     Component Value Date/Time   NA 138 04/17/2021 1255   NA 137 05/14/2017 1054   K 4.5 04/17/2021 1255   K 3.6 05/14/2017 1054   CL 103 04/17/2021 1255   CO2 28 04/17/2021 1255   CO2 26 05/14/2017 1054   GLUCOSE 149 (H) 04/17/2021 1255   GLUCOSE 108 05/14/2017 1054   BUN 12 04/17/2021 1255   BUN 10.7 05/14/2017 1054   CREATININE 0.93 04/17/2021 1255   CREATININE 0.83 10/29/2017 1335   CREATININE 0.7 05/14/2017 1054   CALCIUM 9.2 04/17/2021 1255   CALCIUM 9.2 05/14/2017 1054   PROT 7.4 04/17/2021 1255   PROT 7.3 05/14/2017 1054   ALBUMIN 4.2 04/17/2021 1255   ALBUMIN 3.6 05/14/2017 1054   AST 24 04/17/2021 1255   AST 21 10/29/2017 1335   AST 16 05/14/2017 1054   ALT 26 04/17/2021 1255   ALT 14 10/29/2017 1335   ALT 12 05/14/2017 1054   ALKPHOS 125 (H) 04/17/2021 1255   ALKPHOS 85 05/14/2017 1054   BILITOT 0.4 04/17/2021 1255   BILITOT 0.2 10/29/2017 1335   BILITOT 0.40 05/14/2017 1054   GFRNONAA >60 07/05/2019 0915   GFRNONAA >60 10/29/2017 1335   GFRAA >60 07/05/2019 0915   GFRAA >60 10/29/2017 1335    Assessment: 1.  Acute on chronic left upper quadrant pain: Previous work-up in 2018 with EGD and colonoscopy as well as CT unrevealing, recent increase in pain severity for which patient had repeat CT with no etiology other than "redundant colon", bowel movements are normal for her, pain has gotten better over the past 3 weeks, but not gone, no  nausea, vomiting, heartburn or reflux, bruising over the abdomen at time of the event; uncertain etiology, question colitis?  Plan: 1.  Scheduled patient for diagnostic colonoscopy given increase in pain recently.  This is scheduled Dr. Carlean Purl next week.  Did provide the patient a detailed list of risks of procedure and she agrees to proceed. Patient is appropriate for endoscopic procedure(s) in the ambulatory ([facility]) setting.  2.  Discussed Dicyclomine with the patient but she does not want to try this again as it did not help and she is fearful of side effects. 3.  If colonoscopy is normal this time could consider CTA? 4.  Patient follow in clinic per recommendations from Dr. Carlean Purl after time of procedure.  Cheryl Newer, PA-C [facility] 05/17/2021, 10:27 AM  Cc: Caren Macadam, MD

## 2021-05-23 ENCOUNTER — Other Ambulatory Visit: Payer: Self-pay

## 2021-05-23 ENCOUNTER — Encounter: Payer: Self-pay | Admitting: Internal Medicine

## 2021-05-23 ENCOUNTER — Ambulatory Visit (AMBULATORY_SURGERY_CENTER): Payer: BC Managed Care – PPO | Admitting: Internal Medicine

## 2021-05-23 VITALS — BP 134/75 | HR 80 | Temp 97.9°F | Resp 19 | Ht 68.0 in | Wt 262.0 lb

## 2021-05-23 DIAGNOSIS — R933 Abnormal findings on diagnostic imaging of other parts of digestive tract: Secondary | ICD-10-CM | POA: Diagnosis not present

## 2021-05-23 DIAGNOSIS — R1012 Left upper quadrant pain: Secondary | ICD-10-CM | POA: Diagnosis not present

## 2021-05-23 MED ORDER — SODIUM CHLORIDE 0.9 % IV SOLN
500.0000 mL | Freq: Once | INTRAVENOUS | Status: DC
Start: 2021-05-23 — End: 2021-05-23

## 2021-05-23 NOTE — Progress Notes (Signed)
History and Physical Interval Note:  05/23/2021 8:09 AM  [name]  has presented today for endoscopic procedure(s), with the diagnosis of  Encounter Diagnoses  Name Primary?   LUQ pain Yes   Abnormal CT scan, colon   .  The various methods of evaluation and treatment have been discussed with the patient and/or family. After consideration of risks, benefits and other options for treatment, the patient has consented to  the endoscopic procedure(s).   The patient's history has been reviewed, patient examined, no change in status, stable for endoscopic procedure(s).  I have reviewed the patient's chart and labs.  Questions were answered to the patient's satisfaction.     Gatha Mayer, MD, [facility]

## 2021-05-23 NOTE — Patient Instructions (Addendum)
The colon lining looks normal. The colon is redundant, which means it is elongated and floppy. I cannot say why you have pain. Some possibilities are that the colon was twisted (it is not now) or that there is an issue called an internal hernia related to prior bariatric surgery. The only way to really know this sometimes is to have surgery.  At this point though I know you are not without symptoms, the trend is towards improvement so we should observe. If the pain worsens again we should consider doing an upper endoscopy and possibly returning to your surgeon for an evaluation.  I appreciate the opportunity to care for you. Gatha Mayer, MD, FACG YOU HAD AN ENDOSCOPIC PROCEDURE TODAY AT [facility]:   Refer to the procedure report that was given to you for any specific questions about what was found during the examination.  If the procedure report does not answer your questions, please call your gastroenterologist to clarify.  If you requested that your care partner not be given the details of your procedure findings, then the procedure report has been included in a sealed envelope for you to review at your convenience later.  YOU SHOULD EXPECT: Some feelings of bloating in the abdomen. Passage of more gas than usual.  Walking can help get rid of the air that was put into your GI tract during the procedure and reduce the bloating. If you had a lower endoscopy (such as a colonoscopy or flexible sigmoidoscopy) you may notice spotting of blood in your stool or on the toilet paper. If you underwent a bowel prep for your procedure, you may not have a normal bowel movement for a few days.  Please Note:  You might notice some irritation and congestion in your nose or some drainage.  This is from the oxygen used during your procedure.  There is no need for concern and it should clear up in a day or so.  SYMPTOMS TO REPORT IMMEDIATELY:  Following lower endoscopy (colonoscopy or flexible  sigmoidoscopy):  Excessive amounts of blood in the stool  Significant tenderness or worsening of abdominal pains  Swelling of the abdomen that is new, acute  Fever of 100F or higher  For urgent or emergent issues, a gastroenterologist can be reached at any hour by calling [phone]. Do not use MyChart messaging for urgent concerns.    DIET:  We do recommend a small meal at first, but then you may proceed to your regular diet.  Drink plenty of fluids but you should avoid alcoholic beverages for 24 hours.  ACTIVITY:  You should plan to take it easy for the rest of today and you should NOT DRIVE or use heavy machinery until tomorrow (because of the sedation medicines used during the test).    FOLLOW UP: Our staff will call the number listed on your records 48-72 hours following your procedure to check on you and address any questions or concerns that you may have regarding the information given to you following your procedure. If we do not reach you, we will leave a message.  We will attempt to reach you two times.  During this call, we will ask if you have developed any symptoms of COVID 19. If you develop any symptoms (ie: fever, flu-like symptoms, shortness of breath, cough etc.) before then, please call [phone].  If you test positive for Covid 19 in the 2 weeks post procedure, please call and report this information to [place].  If any biopsies were taken you will be contacted by phone or by letter within the next 1-3 weeks.  Please call us at [phone] if you have not heard about the biopsies in 3 weeks.    SIGNATURES/CONFIDENTIALITY: You and/or your care partner have signed paperwork which will be entered into your electronic medical record.  These signatures attest to the fact that that the information above on your After Visit Summary has been reviewed and is understood.  Full responsibility of the confidentiality of this discharge information lies with you and/or your  care-partner.

## 2021-05-23 NOTE — Progress Notes (Signed)
VS by CW. ?

## 2021-05-23 NOTE — Op Note (Signed)
[facility] Patient Name: [name] Procedure Date: 05/23/2021 7:57 AM MRN: [id number] Endoscopist: Gatha Mayer , MD Age: 50 Referring MD:  Date of Birth: [birth year] Gender: Female Account #: [id number] Procedure:                Colonoscopy Indications:              Abdominal pain in the left upper quadrant, Abnormal                            CT of the GI tract Medicines:                Propofol per Anesthesia, Monitored Anesthesia Care Procedure:                Pre-Anesthesia Assessment:                           - Prior to the procedure, a History and Physical                            was performed, and patient medications and                            allergies were reviewed. The patient's tolerance of                            previous anesthesia was also reviewed. The risks                            and benefits of the procedure and the sedation                            options and risks were discussed with the patient.                            All questions were answered, and informed consent                            was obtained. Prior Anticoagulants: The patient has                            taken no previous anticoagulant or antiplatelet                            agents. ASA Grade Assessment: II - A patient with                            mild systemic disease. After reviewing the risks                            and benefits, the patient was deemed in                            satisfactory condition to undergo the procedure.  After obtaining informed consent, the colonoscope                            was passed under direct vision. Throughout the                            procedure, the patient's blood pressure, pulse, and                            oxygen saturations were monitored continuously. The                            Olympus PCF-[id number] ([id number]) Colonoscope was                            introduced  through the anus and advanced to the the                            terminal ileum, with identification of the                            appendiceal orifice and IC valve. The Olympus                            CF-[id number]) Colonoscope was introduced                            through the and advanced to the. The colonoscopy                            was somewhat difficult due to significant looping.                            Successful completion of the procedure was aided by                            changing endoscopes and applying abdominal                            pressure. The patient tolerated the procedure well.                            The quality of the bowel preparation was good. The                            terminal ileum, ileocecal valve, appendiceal                            orifice, and rectum were photographed. Scope In: 8:13:27 AM Scope Out: 8:45:56 AM Scope Withdrawal Time: 0 hours 10 minutes 46 seconds  Total Procedure Duration: 0 hours 32 minutes 29 seconds  Findings:                 The perianal and digital rectal examinations were  normal.                           The terminal ileum appeared normal.                           The colon (entire examined portion) was redundant.                           The exam was otherwise without abnormality on                            direct and retroflexion views. Complications:            No immediate complications. Estimated Blood Loss:     Estimated blood loss: none. Impression:               - The examined portion of the ileum was normal.                           - Redundant colon.                           - The examination was otherwise normal on direct                            and retroflexion views.                           - No specimens collected. Recommendation:           - Patient has a contact number available for                            emergencies. The signs  and symptoms of potential                            delayed complications were discussed with the                            patient. Return to normal activities tomorrow.                            Written discharge instructions were provided to the                            patient.                           - Resume previous diet.                           - Continue present medications.                           - Repeat colonoscopy in 10 years for screening  purposes.                           - No clear cause of pain seen here. Next steps if                            deteriorates again would be to consider EGD again                            (had in 2018) vcs surgical evaluation to consider                            laparoscopy Gatha Mayer, MD 05/23/2021 8:58:40 AM This report has been signed electronically.

## 2021-05-23 NOTE — Progress Notes (Signed)
Pt Drowsy. VSS. To PACU, report to RN. No anesthetic complications noted.  

## 2021-05-25 ENCOUNTER — Telehealth: Payer: Self-pay | Admitting: *Deleted

## 2021-05-25 NOTE — Telephone Encounter (Signed)
  Follow up Call-  Call back number 05/23/2021  Post procedure Call Back phone  # [phone]  Permission to leave phone message Yes  Some recent data might be hidden     Patient questions:  Do you have a fever, pain , or abdominal swelling? No. Pain Score  0 *  Have you tolerated food without any problems? Yes.    Have you been able to return to your normal activities? Yes.    Do you have any questions about your discharge instructions: Diet   No. Medications  No. Follow up visit  No.  Do you have questions or concerns about your Care? No.  Actions: * If pain score is 4 or above: No action needed, pain <4.  Have you developed a fever since your procedure? no  2.   Have you had an respiratory symptoms (SOB or cough) since your procedure? no  3.   Have you tested positive for COVID 19 since your procedure no  4.   Have you had any family members/close contacts diagnosed with the COVID 19 since your procedure?  no   If yes to any of these questions please route to Joylene John, RN and Joella Prince, RN

## 2021-09-24 ENCOUNTER — Ambulatory Visit (INDEPENDENT_AMBULATORY_CARE_PROVIDER_SITE_OTHER): Payer: BC Managed Care – PPO | Admitting: Family Medicine

## 2021-09-24 ENCOUNTER — Encounter: Payer: Self-pay | Admitting: Family Medicine

## 2021-09-24 VITALS — BP 150/90 | HR 90 | Temp 97.7°F | Ht 68.0 in | Wt 252.7 lb

## 2021-09-24 DIAGNOSIS — E1142 Type 2 diabetes mellitus with diabetic polyneuropathy: Secondary | ICD-10-CM

## 2021-09-24 DIAGNOSIS — D508 Other iron deficiency anemias: Secondary | ICD-10-CM | POA: Diagnosis not present

## 2021-09-24 DIAGNOSIS — E538 Deficiency of other specified B group vitamins: Secondary | ICD-10-CM | POA: Diagnosis not present

## 2021-09-24 DIAGNOSIS — E559 Vitamin D deficiency, unspecified: Secondary | ICD-10-CM

## 2021-09-24 DIAGNOSIS — E785 Hyperlipidemia, unspecified: Secondary | ICD-10-CM | POA: Diagnosis not present

## 2021-09-24 DIAGNOSIS — G629 Polyneuropathy, unspecified: Secondary | ICD-10-CM | POA: Diagnosis not present

## 2021-09-24 LAB — FOLATE: Folate: 14.1 ng/mL (ref >5.9)

## 2021-09-24 LAB — CBC WITH DIFFERENTIAL/PLATELET
Basophils Absolute: 0.1 10*3/uL (ref 0.0–0.1)
Basophils Relative: 1.3 % (ref 0.0–3.0)
Eosinophils Absolute: 0.2 10*3/uL (ref 0.0–0.7)
Eosinophils Relative: 3.1 % (ref 0.0–5.0)
HCT: 43.2 % (ref 36.0–46.0)
Hemoglobin: 14.2 g/dL (ref 12.0–15.0)
Lymphocytes Relative: 27 % (ref 12.0–46.0)
Lymphs Abs: 2.2 10*3/uL (ref 0.7–4.0)
MCHC: 32.8 g/dL (ref 30.0–36.0)
MCV: 90.2 fl (ref 78.0–100.0)
Monocytes Absolute: 0.5 10*3/uL (ref 0.1–1.0)
Monocytes Relative: 6.7 % (ref 3.0–12.0)
Neutro Abs: 4.9 10*3/uL (ref 1.4–7.7)
Neutrophils Relative %: 61.9 % (ref 43.0–77.0)
Platelets: 369 10*3/uL (ref 150.0–400.0)
RBC: 4.79 Mil/uL (ref 3.87–5.11)
RDW: 13.6 % (ref 11.5–15.5)
WBC: 8 10*3/uL (ref 4.0–10.5)

## 2021-09-24 LAB — HEMOGLOBIN A1C: Hgb A1c MFr Bld: 7.2 % — ABNORMAL HIGH (ref 4.6–6.5)

## 2021-09-24 LAB — VITAMIN D 25 HYDROXY (VIT D DEFICIENCY, FRACTURES): VITD: 19.73 ng/mL — ABNORMAL LOW (ref 30.00–100.00)

## 2021-09-24 LAB — VITAMIN B12: Vitamin B-12: 146 pg/mL — ABNORMAL LOW (ref 211–911)

## 2021-09-24 NOTE — Patient Instructions (Signed)
Check blood pressures at home 1-2 times daily for next week and report back to me.

## 2021-09-24 NOTE — Progress Notes (Signed)
[name] DOB: Dec 13, 1970 Encounter date: 09/24/2021  This is a 51 y.o. female who presents with Chief Complaint  Patient presents with   Pain    Patient complains of bilateral foot pain, bilateral hands and facial pain x3-4 months, feels "neuropathy is worsening"    History of present illness: Last visit with me was 03/05/21 for phsyical. Saw Dr. Jerilee Hoh in September for abdominal pain.   Neuropathy pain now has been constant for several months. Around mouth but not in mouth. We talked about it before and it has been more intermittent, but has been constant now. Still on cymbalta 120mg  dose. Tried going off before and restarted. The neuropathy started with foot drop out of blue. Then felt like neuropathy occurred after that.   Anxiety/depression: not sleeping because of pain. Aching as well. Following with Dr. Toy Care in a few weeks. Pain overrides the trazodone.   Taking regular multivitamin.   Does have headaches - right front ; no change from baseline.   Not taking b12 anymore. Did this for awhile.   Not checking blood sugars at home.    Allergies  Allergen Reactions   Bactrim [Sulfamethoxazole-Trimethoprim] Other (See Comments)    HALLUCINATIONS, FEVER, CHILLS   Ibuprofen Other (See Comments)    Had gastric bypass. Told not to take NSAIDs.   Lamotrigine Hives   Sulfa Antibiotics Other (See Comments)    HALLUCINATIONS, FEVER, CHILLS     Current Meds  Medication Sig   Acetaminophen (TYLENOL PO) Take 1 capsule by mouth every 8 (eight) hours.   DULoxetine (CYMBALTA) 60 MG capsule Take 120 mg by mouth every morning.    nystatin (MYCOSTATIN/NYSTOP) powder Apply 1 application topically 3 (three) times daily. Apply to affected area for up to 7 days   traZODone (DESYREL) 100 MG tablet Take 1 tablet by mouth at bedtime as needed.    Review of Systems  Constitutional:  Positive for fatigue. Negative for activity change, appetite change, chills, fever and unexpected  weight change.  HENT:  Negative for congestion, ear pain, hearing loss, sinus pressure, sinus pain, sore throat and trouble swallowing.   Eyes:  Negative for pain and visual disturbance.  Respiratory:  Negative for cough, chest tightness, shortness of breath and wheezing.   Cardiovascular:  Negative for chest pain, palpitations and leg swelling.  Gastrointestinal:  Negative for abdominal pain, blood in stool, constipation, diarrhea, nausea and vomiting.  Genitourinary:  Negative for difficulty urinating and menstrual problem.  Musculoskeletal:  Negative for arthralgias and back pain.  Skin:  Negative for rash.  Neurological:  Positive for numbness and headaches (at baseline). Negative for dizziness and weakness.  Hematological:  Negative for adenopathy. Does not bruise/bleed easily.  Psychiatric/Behavioral:  Negative for sleep disturbance and suicidal ideas. The patient is not nervous/anxious.    Objective:  BP (!) 150/90 (BP Location: Left Arm, Patient Position: Sitting, Cuff Size: Large)    Pulse 90    Temp 97.7 F (36.5 C) (Oral)    Ht 5\' 8"  (1.727 m)    Wt 252 lb 11.2 oz (114.6 kg)    SpO2 98%    BMI 38.42 kg/m   Weight: 252 lb 11.2 oz (114.6 kg)   BP Readings from Last 3 Encounters:  09/24/21 (!) 150/90  05/23/21 134/75  05/17/21 140/70   Wt Readings from Last 3 Encounters:  09/24/21 252 lb 11.2 oz (114.6 kg)  05/23/21 262 lb (118.8 kg)  05/17/21 262 lb 4 oz (119 kg)    Physical  Exam Constitutional:      General: She is not in acute distress.    Appearance: She is well-developed. She is not diaphoretic.  HENT:     Head: Normocephalic and atraumatic.     Right Ear: External ear normal.     Left Ear: External ear normal.  Eyes:     Conjunctiva/sclera: Conjunctivae normal.     Pupils: Pupils are equal, round, and reactive to light.  Neck:     Thyroid: No thyromegaly.  Cardiovascular:     Rate and Rhythm: Normal rate and regular rhythm.     Heart sounds: Normal heart  sounds. No murmur heard.   No friction rub. No gallop.  Pulmonary:     Effort: Pulmonary effort is normal. No respiratory distress.     Breath sounds: Normal breath sounds. No wheezing or rales.  Musculoskeletal:     Cervical back: Neck supple.  Lymphadenopathy:     Cervical: No cervical adenopathy.  Skin:    General: Skin is warm and dry.  Neurological:     Mental Status: She is alert and oriented to person, place, and time.     Cranial Nerves: No cranial nerve deficit.     Motor: No abnormal muscle tone.     Deep Tendon Reflexes: Reflexes normal.     Reflex Scores:      Tricep reflexes are 2+ on the right side and 2+ on the left side.      Bicep reflexes are 2+ on the right side and 2+ on the left side.      Brachioradialis reflexes are 2+ on the right side and 2+ on the left side.      Patellar reflexes are 2+ on the right side and 2+ on the left side.    Comments: Monofilament testing intact. She cannot discriminate sharp/soft on feet,toes.  Psychiatric:        Behavior: Behavior normal.    Assessment/Plan  1. Neuropathy Will start with bloodwork. Hx of b12 def.facial numbness not consistent with this, encouraged her to call for follow up back with prior neurologist. - Vitamin B1; Future - CBC with Differential/Platelet; Future - Comprehensive metabolic panel; Future - Magnesium, RBC; Future  2. Iron deficiency anemia secondary to inadequate dietary iron intake - IBC + Ferritin; Future  3. B12 deficiency - Vitamin B12; Future - Methylmalonic acid, serum; Future  4. Folate deficiency - Folate; Future - Homocysteine; Future  5. Type 2 diabetes mellitus with diabetic polyneuropathy, without long-term current use of insulin (HCC) - Hemoglobin A1c; Future  6. Vitamin D deficiency - VITAMIN D 25 Hydroxy (Vit-D Deficiency, Fractures); Future  7. Hyperlipidemia, unspecified hyperlipidemia type - Lipid panel; Future  Return for pending lab or imaging  results.      Micheline Rough, MD

## 2021-09-25 LAB — COMPREHENSIVE METABOLIC PANEL
ALT: 21 U/L (ref 0–35)
AST: 22 U/L (ref 0–37)
Albumin: 4.6 g/dL (ref 3.5–5.2)
Alkaline Phosphatase: 137 U/L — ABNORMAL HIGH (ref 39–117)
BUN: 12 mg/dL (ref 6–23)
CO2: 28 mEq/L (ref 19–32)
Calcium: 9.4 mg/dL (ref 8.4–10.5)
Chloride: 102 mEq/L (ref 96–112)
Creatinine, Ser: 0.97 mg/dL (ref 0.40–1.20)
GFR: 67.84 mL/min (ref >60.00)
Glucose, Bld: 148 mg/dL — ABNORMAL HIGH (ref 70–99)
Potassium: 4.6 mEq/L (ref 3.5–5.1)
Sodium: 138 mEq/L (ref 135–145)
Total Bilirubin: 0.4 mg/dL (ref 0.2–1.2)
Total Protein: 7.8 g/dL (ref 6.0–8.3)

## 2021-09-25 LAB — IBC + FERRITIN
Ferritin: 21.8 ng/mL (ref 10.0–291.0)
Iron: 156 ug/dL — ABNORMAL HIGH (ref 42–145)
Saturation Ratios: 31 % (ref 20.0–50.0)
TIBC: 504 ug/dL — ABNORMAL HIGH (ref 250.0–450.0)
Transferrin: 360 mg/dL (ref 212.0–360.0)

## 2021-09-25 LAB — LIPID PANEL
Cholesterol: 228 mg/dL — ABNORMAL HIGH (ref 0–200)
HDL: 77.5 mg/dL (ref >39.00)
LDL Cholesterol: 120 mg/dL — ABNORMAL HIGH (ref 0–99)
NonHDL: 150.48
Total CHOL/HDL Ratio: 3
Triglycerides: 152 mg/dL — ABNORMAL HIGH (ref 0.0–149.0)
VLDL: 30.4 mg/dL (ref 0.0–40.0)

## 2021-09-26 ENCOUNTER — Other Ambulatory Visit: Payer: Self-pay | Admitting: Family Medicine

## 2021-09-26 DIAGNOSIS — E1142 Type 2 diabetes mellitus with diabetic polyneuropathy: Secondary | ICD-10-CM

## 2021-09-26 DIAGNOSIS — E538 Deficiency of other specified B group vitamins: Secondary | ICD-10-CM

## 2021-09-26 DIAGNOSIS — D508 Other iron deficiency anemias: Secondary | ICD-10-CM

## 2021-09-26 DIAGNOSIS — E559 Vitamin D deficiency, unspecified: Secondary | ICD-10-CM

## 2021-09-26 MED ORDER — B-12 1000 MCG SL SUBL: 1000.0000 ug | SUBLINGUAL_TABLET | Freq: Every day | SUBLINGUAL | 1 refills | Status: AC

## 2021-09-26 MED ORDER — FOLIC ACID 1 MG PO TABS: 1.0000 mg | ORAL_TABLET | Freq: Every day | ORAL | 1 refills | Status: DC

## 2021-09-26 MED ORDER — VITAMIN D (ERGOCALCIFEROL) 1.25 MG (50000 UNIT) PO CAPS: 50000.0000 [IU] | ORAL_CAPSULE | ORAL | 1 refills | Status: DC

## 2021-10-02 LAB — MAGNESIUM, RBC: Magnesium RBC: 5.9 mg/dL (ref 4.0–6.4)

## 2021-10-02 LAB — METHYLMALONIC ACID, SERUM: Methylmalonic Acid, Quant: 206 nmol/L (ref 87–318)

## 2021-10-02 LAB — VITAMIN B1: Vitamin B1 (Thiamine): 12 nmol/L (ref 8–30)

## 2021-10-02 LAB — HOMOCYSTEINE: Homocysteine: 14.6 umol/L — ABNORMAL HIGH (ref <10.4)

## 2021-10-03 DIAGNOSIS — F5101 Primary insomnia: Secondary | ICD-10-CM | POA: Diagnosis not present

## 2021-10-03 DIAGNOSIS — F3342 Major depressive disorder, recurrent, in full remission: Secondary | ICD-10-CM | POA: Diagnosis not present

## 2021-10-16 ENCOUNTER — Encounter: Payer: Self-pay | Admitting: Family Medicine

## 2021-10-17 ENCOUNTER — Ambulatory Visit: Payer: BC Managed Care – PPO | Admitting: Family Medicine

## 2021-10-17 ENCOUNTER — Encounter: Payer: Self-pay | Admitting: Family Medicine

## 2021-10-17 VITALS — BP 140/86 | HR 97 | Temp 98.1°F | Ht 68.0 in | Wt 254.7 lb

## 2021-10-17 DIAGNOSIS — R519 Headache, unspecified: Secondary | ICD-10-CM

## 2021-10-17 DIAGNOSIS — I10 Essential (primary) hypertension: Secondary | ICD-10-CM | POA: Diagnosis not present

## 2021-10-17 MED ORDER — LOSARTAN POTASSIUM 50 MG PO TABS: 50.0000 mg | ORAL_TABLET | Freq: Every day | ORAL | 1 refills | Status: DC

## 2021-10-17 MED ORDER — KETOROLAC TROMETHAMINE 60 MG/2ML IM SOLN
60.0000 mg | Freq: Once | INTRAMUSCULAR | Status: AC
  Administered 2021-10-17: 60 mg via INTRAMUSCULAR

## 2021-10-17 MED ORDER — KETOROLAC TROMETHAMINE 60 MG/2ML IM SOLN: 60.0000 mg | Freq: Once | INTRAMUSCULAR | Status: DC

## 2021-10-17 NOTE — Addendum Note (Signed)
Addended by: Agnes Lawrence on: 10/17/2021 04:58 PM ? ? Modules accepted: Orders ? ?

## 2021-10-17 NOTE — Progress Notes (Signed)
?[name] ?DOB: 1971-06-01 ?Encounter date: 10/17/2021 ? ?This is a 51 y.o. female who presents with ?Chief Complaint  ?Patient presents with  ? Hypertension  ? ? ?History of present illness: ?Her bp yesterday morning at work started going sky high. Felt bad, shaky, flushed. Went to nurse on campus. Pressure was 190/94. Had headache and no other sx. This morning woke up and had pain in right shoulder, chest, left side of neck.  ? ?Bp at home has been in 161-096 systolic with machine.  ? ?Yesterday was first day that she felt like something was bad - where she really didn't feel well. Started ok at work, had a couple of meetings, but then just really didn't feel well. ? ?Chest feels sore. This has started later this morning. Neck pain was there when she woke up, but chest discomfort has started since then. Feels uncomfortable to take deep breath.  ? ?Feeling headache pulsing at top of head; little more above right eye. Pulsing top of head is not typical headache for her. Rates headache as 6-7/10. Baseline is 2. Tylenol didn't help at all. ? ? ?Allergies  ?Allergen Reactions  ? Bactrim [Sulfamethoxazole-Trimethoprim] Other (See Comments)  ?  HALLUCINATIONS, FEVER, CHILLS  ? Ibuprofen Other (See Comments)  ?  Had gastric bypass. Told not to take NSAIDs.  ? Lamotrigine Hives  ? Sulfa Antibiotics Other (See Comments)  ?  HALLUCINATIONS, FEVER, CHILLS ? ?  ? ?Current Meds  ?Medication Sig  ? Acetaminophen (TYLENOL PO) Take 1 capsule by mouth every 8 (eight) hours.  ? Cyanocobalamin (B-12) 1000 MCG SUBL Place 1,000 mcg under the tongue daily.  ? DULoxetine (CYMBALTA) 60 MG capsule Take 120 mg by mouth every morning.   ? folic acid (FOLVITE) 1 MG tablet Take 1 tablet (1 mg total) by mouth daily.  ? losartan (COZAAR) 50 MG tablet Take 1 tablet (50 mg total) by mouth daily.  ? nystatin (MYCOSTATIN/NYSTOP) powder Apply 1 application topically 3 (three) times daily. Apply to affected area for up to 7 days  ? traZODone  (DESYREL) 100 MG tablet Take 1 tablet by mouth at bedtime as needed.  ? Vitamin D, Ergocalciferol, (DRISDOL) 1.25 MG (50000 UNIT) CAPS capsule Take 1 capsule (50,000 Units total) by mouth every 7 (seven) days.  ? ?Current Facility-Administered Medications for the 10/17/21 encounter (Office Visit) with Caren Macadam, MD  ?Medication  ? ketorolac (TORADOL) injection 60 mg  ? ? ?Review of Systems  ?Constitutional:  Negative for chills, fatigue and fever.  ?Respiratory:  Negative for cough, chest tightness (chest discomfort), shortness of breath and wheezing.   ?Cardiovascular:  Negative for chest pain, palpitations and leg swelling.  ?Musculoskeletal:  Positive for neck pain. Negative for neck stiffness.  ?Neurological:  Positive for headaches (head is pounding).  ? ?Objective: ? ?BP 140/86   Pulse 97   Temp 98.1 ?F (36.7 ?C) (Oral)   Ht '5\' 8"'$  (1.727 m)   Wt 254 lb 11.2 oz (115.5 kg)   SpO2 98%   BMI 38.73 kg/m?   Weight: 254 lb 11.2 oz (115.5 kg)  ? ?BP Readings from Last 3 Encounters:  ?10/17/21 140/86  ?09/24/21 (!) 150/90  ?05/23/21 134/75  ? ?Wt Readings from Last 3 Encounters:  ?10/17/21 254 lb 11.2 oz (115.5 kg)  ?09/24/21 252 lb 11.2 oz (114.6 kg)  ?05/23/21 262 lb (118.8 kg)  ? ? ?Physical Exam ?Constitutional:   ?   General: She is not in acute distress. ?  Appearance: She is well-developed. She is obese.  ?Cardiovascular:  ?   Rate and Rhythm: Normal rate and regular rhythm.  ?   Heart sounds: Normal heart sounds. No murmur heard. ?  No friction rub.  ?   Comments: No chest wall tenderness ?Pulmonary:  ?   Effort: Pulmonary effort is normal. No respiratory distress.  ?   Breath sounds: Normal breath sounds. No wheezing or rales.  ?Musculoskeletal:  ?   Right lower leg: No edema.  ?   Left lower leg: No edema.  ?Neurological:  ?   Mental Status: She is alert and oriented to person, place, and time.  ?Psychiatric:     ?   Behavior: Behavior normal.  ? ? ?Assessment/Plan ? ?1. Hypertension,  unspecified type ?Blood pressure has improved on recheck, but has been elevated and with diabetes established, we are going to add losartan for better control and renal protection. Monitor bp at home. Let me know if not improving. Follow up in 2 weeks to recheck. EKG low voltage, but sinus rhythm without acute changes. Overall looks stable from prior.  ?- EKG 12-Lead ? ?2. Intractable headache, unspecified chronicity pattern, unspecified headache type ?She cannot take nsaids due to bypass hx, but we will try toradol today. I suspect that with bp treatment, headache should improve. Let me know if not better tomorrow. ?- ketorolac (TORADOL) injection 60 mg ? ?Return in about 2 weeks (around 10/31/2021) for Chronic condition visit. ? ? ? ? ? ?Micheline Rough, MD ?

## 2021-10-17 NOTE — Patient Instructions (Addendum)
*  start losartan once available at pharmacy. This should help with blood pressure within an hour, but maximum benefit will take a couple of weeks.  ?*let me know if headache is not better tomorrow. Drink water, rest.  ? ?Our goal for your blood pressure is to get you down to 110-125/65-80 range. I would be happy for now just seeing you in the 638'G systolic and would expect your symptoms to improve once pressures are regularly below 665 systolic.  ? ?If you feel any worse, then I want you to be evaluated.  ?

## 2021-10-17 NOTE — Telephone Encounter (Signed)
Access Nurse Call 10/16/21 at 1432 ? ?--caller states her bp is elevated this morning at work. ?190/94 she was shaky and flushed ? ?10/16/2021 2:36:21 PM See PCP within 24 Hours [place], RN, Estill Bamberg ? ?Referrals ?GO TO FACILITY OTHER - SPECIFY ? ?10/17/21 0953: Pt states she took BP 149/83. States she has a h/a currently, denies chest pain.  Pt does not have a dx of htn. Appt scheduled with available provider today. ?

## 2021-10-18 ENCOUNTER — Encounter: Payer: Self-pay | Admitting: Family Medicine

## 2021-10-29 ENCOUNTER — Ambulatory Visit: Payer: BC Managed Care – PPO | Admitting: Family Medicine

## 2021-11-01 ENCOUNTER — Ambulatory Visit (INDEPENDENT_AMBULATORY_CARE_PROVIDER_SITE_OTHER): Payer: BC Managed Care – PPO | Admitting: Obstetrics and Gynecology

## 2021-11-01 ENCOUNTER — Encounter: Payer: Self-pay | Admitting: Obstetrics and Gynecology

## 2021-11-01 ENCOUNTER — Other Ambulatory Visit: Payer: Self-pay

## 2021-11-01 VITALS — BP 150/84 | HR 86 | Ht 67.75 in | Wt 255.0 lb

## 2021-11-01 DIAGNOSIS — Z01419 Encounter for gynecological examination (general) (routine) without abnormal findings: Secondary | ICD-10-CM | POA: Diagnosis not present

## 2021-11-01 MED ORDER — NYSTATIN 100000 UNIT/GM EX POWD: 1.0000 "application " | Freq: Three times a day (TID) | CUTANEOUS | 3 refills | Status: AC

## 2021-11-01 NOTE — Progress Notes (Signed)
51 y.o. G19P1021 Married Caucasian female here for annual exam.   ? ?Had postmenopausal bleeding last summer.  ?Pelvic US EMS 2.38 mm.  ?EMB benign.  ?[place] 51 and E2< 15. ? ?No further bleeding.  ?No hot flashes.  ? ?Some nipple discharge for many years.  ?Negative work up.  ? ?Daughter is a Sr. In HS.  ? ?PCP:  Micheline Rough, MD  ? ?Patient's last menstrual period was 10/11/2019 (approximate).     ?  ?    ?Sexually active: Yes.    ?The current method of family planning is none--female partner.    ?Exercising: Yes.     Gardening, walking  dog ?Smoker:  no ? ?Health Maintenance: ?Pap:   03-20-21 ASCUS:Neg HR HPV, 09-19-17 Neg:Neg HR HPV, 04-29-14 Neg:Neg HR HPV ?History of abnormal Pap:  no ?MMG:  03-18-21 MR Br.Bil/SEE EPIC ?Colonoscopy:   05/23/21 -  normal;next 10 years ?BMD:   n/a  Result  n/a ?TDaP:  2014 ?Gardasil:   no ?HIV:  05-19-15 NR ?Hep C: 05-19-15 Neg ?Screening Labs:  PCP.  ? ? reports that she has never smoked. She has never used smokeless tobacco. She reports current alcohol use of about 4.0 - 6.0 standard drinks per week. She reports that she does not use drugs. ? ?Past Medical History:  ?Diagnosis Date  ? Allergy   ? Anxiety and depression   ? Arthritis   ? Clotting disorder ([place])   ? Complication of anesthesia   ? has been hard to wake up post-op  ? Dental crowns present   ? Depression   ? Diabetes ([place])   ? resolved with gastric bypass, no meds  ? DVT of lower extremity (deep venous thrombosis) ([place]) 2012  ? LLE  ? Dysmenorrhea   ? hx endometriosis and fibroid  ? Elevated prolactin level 2021  ? Family history of breast cancer   ? Headache(784.0)   ? tension or sinus  ? History of MRSA infection prior to 2012  ? lasted 5-6 yr.  ? Hx of nipple discharge   ? Bilateral.  Long standing.  Negative work up.   ? Hypertension   ? Jaw snapping   ? states jaw pops  ? Left foot drop 2016  ? --[facility]  ? Neuromuscular disorder ([place])   ? neuropathy in hands,feet,face  ? Neuropathy 2015  ? hands, feet and  face--[facility]  ? Prothrombin gene mutation ([place]) 2018  ? single gene mutation  ? Right knee pain   ? hx meniscal injury, chondromalacia, OA  ? Thyroid nodule   ? ? ?Past Surgical History:  ?Procedure Laterality Date  ? CHOLECYSTECTOMY  1990's  ? DILITATION & CURRETTAGE/HYSTROSCOPY WITH NOVASURE ABLATION N/A 05/30/2014  ? Procedure: DILATATION & CURETTAGE/HYSTEROSCOPY WITH attempted [place] and with IUD removal ;  Surgeon: Jamey Reas de Berton Lan, MD;  Location: [facility];  Service: Gynecology;  Laterality: N/A;  ? GASTRIC ROUX-EN-Y N/A 11/02/2013  ? Procedure: LAPAROSCOPIC ROUX-EN-Y GASTRIC BYPASS WITH UPPER ENDOSCOPY;  Surgeon: Gayland Curry, MD;  Location: [facility];  Service: General;  Laterality: N/A;  ? HAND SURGERY Left   ? X 3 - spider bite and MRSA infection  ? HYSTEROSCOPY WITH RESECTOSCOPE N/A 05/30/2014  ? Procedure: HYSTEROSCOPY WITH RESECTOSCOPE;  Surgeon: Jamey Reas de Berton Lan, MD;  Location: [facility];  Service: Gynecology;  Laterality: N/A;  ? KNEE ARTHROSCOPY Right 12/29/2012  ? Procedure: RIGHT KNEE ARTHROSCOPY  PARTIAL MEDIAL AND LATERAL  MENISECTOMY WITH CHONDROPLASTY;  Surgeon: Hessie Dibble, MD;  Location: [facility];  Service: Orthopedics;  Laterality: Right;  ? PELVIC LAPAROSCOPY  1990's  ? d/t endometriosis  ? TONSILLECTOMY  as child  ? TOTAL KNEE ARTHROPLASTY Left 07/22/2017  ? Procedure: TOTAL KNEE ARTHROPLASTY;  Surgeon: Melrose Nakayama, MD;  Location: [facility];  Service: Orthopedics;  Laterality: Left;  ? TOTAL KNEE ARTHROPLASTY Right 05/26/2018  ? Procedure: RIGHT TOTAL KNEE ARTHROPLASTY;  Surgeon: Melrose Nakayama, MD;  Location: [facility];  Service: Orthopedics;  Laterality: Right;  ? WRIST SURGERY Right 2005  ? ? ?Current Outpatient Medications  ?Medication Sig Dispense Refill  ? Acetaminophen (TYLENOL PO) Take 1 capsule by mouth every 8 (eight) hours.    ? Cyanocobalamin (B-12) 1000 MCG SUBL Place 1,000 mcg under the tongue daily. 90  tablet 1  ? DULoxetine (CYMBALTA) 60 MG capsule Take 120 mg by mouth every morning.     ? folic acid (FOLVITE) 1 MG tablet Take 1 tablet (1 mg total) by mouth daily. 90 tablet 1  ? losartan (COZAAR) 50 MG tablet Take 1 tablet (50 mg total) by mouth daily. 90 tablet 1  ? nystatin (MYCOSTATIN/NYSTOP) powder Apply 1 application topically 3 (three) times daily. Apply to affected area for up to 7 days 30 g 3  ? traZODone (DESYREL) 100 MG tablet Take 1 tablet by mouth at bedtime as needed.    ? Vitamin D, Ergocalciferol, (DRISDOL) 1.25 MG (50000 UNIT) CAPS capsule Take 1 capsule (50,000 Units total) by mouth every 7 (seven) days. 12 capsule 1  ? ?No current facility-administered medications for this visit.  ? ? ?Family History  ?Problem Relation Age of Onset  ? Diabetes Mother   ?     Living  ? Kidney disease Mother   ? Hypertension Mother   ? Breast cancer Mother 40  ?     dx'd with breast ca x2; 2nd around 59; 3rd 60's  ? Diabetes Maternal Grandmother 25  ?     dec  ? Breast cancer Maternal Grandmother   ?     dx in her 19s  ? Arthritis Father   ? Healthy Sister   ? Aneurysm Sister 74  ?     brain  ? High blood pressure Sister   ? Diabetes Maternal Grandfather   ? Stroke Maternal Grandfather   ? Other Paternal Grandmother   ?     died in childbirth  ? Other Paternal Grandfather   ?     unknown  ? Healthy Daughter   ? Breast cancer Other   ?     2 great aunts  ? Thyroid disease Neg Hx   ? ? ?Review of Systems  ?All other systems reviewed and are negative. ? ?Exam:   ?BP (!) 150/84   Pulse 86   Ht 5' 7.75" (1.721 m)   Wt 255 lb (115.7 kg)   LMP 10/11/2019 (Approximate)   SpO2 97%   BMI 39.06 kg/m?     ?General appearance: alert, cooperative and appears stated age ?Head: normocephalic, without obvious abnormality, atraumatic ?Neck: no adenopathy, supple, symmetrical, trachea midline and thyroid normal to inspection and palpation ?Lungs: clear to auscultation bilaterally ?Breasts: normal appearance, no masses or  tenderness, No nipple retraction or dimpling, No nipple discharge or bleeding, No axillary adenopathy ?Heart: regular rate and rhythm ?Abdomen: soft, non-tender; no masses, no organomegaly ?Extremities: extremities normal, atraumatic, no cyanosis or edema ?Skin: skin color, texture, turgor normal. No  rashes or lesions ?Lymph nodes: cervical, supraclavicular, and axillary nodes normal. ?Neurologic: grossly normal ? ?Pelvic: External genitalia:  right inferior labial skin tag - salmon colored 4 mm. ?             No abnormal inguinal nodes palpated. ?             Urethra:  normal appearing urethra with no masses, tenderness or lesions ?             Bartholins and Skenes: normal    ?             Vagina: normal appearing vagina with normal color and discharge, no lesions ?             Cervix: no lesions ?             Pap taken: no ?Bimanual Exam:  Uterus:  normal size, contour, position, consistency, mobility, non-tender ?             Adnexa: no mass, fullness, tenderness ?             Rectal exam: yes.  Confirms. ?             Anus:  normal sphincter tone, no lesions ? ?Chaperone was present for exam:  yes ? ?Assessment:   ?Well woman visit with gynecologic exam. ?Postmenopausal. ?Status post endometrial ablation.  ?Fibroids.  Small. ?HX ASCUS pap, neg HR HPV. ?Prothrombin gene mutation.  ?Hx DVT. ?Hx bilateral nipple discharge with negative work up.  ?FH of breast cancer. ?Increased lifetime risk of breast cancer - 22.1%.  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 ?[place] of flexural fold.  ? ?Plan: ?Mammogram screening discussed.  She will schedule  ?OK for annual breast MRI, but will do mammogram first.  ?Self breast awareness reviewed. ?Pap and HR HPV 2025. ?Guidelines for Calcium, Vitamin D, regular exercise program including cardiovascular and weight bearing exercise. ?Refill of Nystatin power.  ?Follow up annually and prn.  ? ?After visit summary provided.  ? ? ? ?

## 2021-11-01 NOTE — Patient Instructions (Signed)

## 2021-11-08 ENCOUNTER — Other Ambulatory Visit: Payer: Self-pay | Admitting: Obstetrics and Gynecology

## 2021-11-08 DIAGNOSIS — Z1231 Encounter for screening mammogram for malignant neoplasm of breast: Secondary | ICD-10-CM

## 2021-11-14 ENCOUNTER — Ambulatory Visit
Admission: RE | Admit: 2021-11-14 | Discharge: 2021-11-14 | Disposition: A | Discharge status: Home / Self Care | Payer: BC Managed Care – PPO | Source: Ambulatory Visit | Attending: Obstetrics and Gynecology | Admitting: Obstetrics and Gynecology

## 2021-11-14 DIAGNOSIS — Z1231 Encounter for screening mammogram for malignant neoplasm of breast: Secondary | ICD-10-CM | POA: Diagnosis not present

## 2021-11-16 ENCOUNTER — Encounter: Payer: Self-pay | Admitting: Obstetrics and Gynecology

## 2021-11-27 ENCOUNTER — Other Ambulatory Visit (INDEPENDENT_AMBULATORY_CARE_PROVIDER_SITE_OTHER): Payer: BC Managed Care – PPO

## 2021-11-27 DIAGNOSIS — E559 Vitamin D deficiency, unspecified: Secondary | ICD-10-CM | POA: Diagnosis not present

## 2021-11-27 DIAGNOSIS — E538 Deficiency of other specified B group vitamins: Secondary | ICD-10-CM

## 2021-11-27 DIAGNOSIS — E1142 Type 2 diabetes mellitus with diabetic polyneuropathy: Secondary | ICD-10-CM | POA: Diagnosis not present

## 2021-11-27 LAB — VITAMIN D 25 HYDROXY (VIT D DEFICIENCY, FRACTURES): VITD: 25.34 ng/mL — ABNORMAL LOW (ref 30.00–100.00)

## 2021-11-27 LAB — HEMOGLOBIN A1C: Hgb A1c MFr Bld: 7.1 % — ABNORMAL HIGH (ref 4.6–6.5)

## 2021-11-27 LAB — FOLATE: Folate: >23.5 ng/mL (ref >5.9)

## 2021-11-27 LAB — VITAMIN B12: Vitamin B-12: 246 pg/mL (ref 211–911)

## 2021-11-30 LAB — HOMOCYSTEINE: Homocysteine: 12.2 umol/L — ABNORMAL HIGH (ref <10.4)

## 2021-11-30 LAB — METHYLMALONIC ACID, SERUM: Methylmalonic Acid, Quant: 166 nmol/L (ref 87–318)

## 2021-12-03 ENCOUNTER — Ambulatory Visit: Payer: BC Managed Care – PPO | Admitting: Family Medicine

## 2021-12-03 ENCOUNTER — Encounter: Payer: Self-pay | Admitting: Family Medicine

## 2021-12-03 VITALS — BP 142/92 | HR 73 | Temp 97.7°F | Ht 67.75 in | Wt 259.6 lb

## 2021-12-03 DIAGNOSIS — R519 Headache, unspecified: Secondary | ICD-10-CM

## 2021-12-03 DIAGNOSIS — I10 Essential (primary) hypertension: Secondary | ICD-10-CM | POA: Diagnosis not present

## 2021-12-03 DIAGNOSIS — E538 Deficiency of other specified B group vitamins: Secondary | ICD-10-CM | POA: Diagnosis not present

## 2021-12-03 DIAGNOSIS — E1142 Type 2 diabetes mellitus with diabetic polyneuropathy: Secondary | ICD-10-CM | POA: Diagnosis not present

## 2021-12-03 DIAGNOSIS — G8929 Other chronic pain: Secondary | ICD-10-CM

## 2021-12-03 MED ORDER — EMPAGLIFLOZIN 10 MG PO TABS: 10.0000 mg | ORAL_TABLET | Freq: Every day | ORAL | 0 refills | Status: DC

## 2021-12-03 MED ORDER — LOSARTAN POTASSIUM 100 MG PO TABS: 100.0000 mg | ORAL_TABLET | Freq: Every day | ORAL | 2 refills | Status: DC

## 2021-12-03 NOTE — Progress Notes (Signed)
?Randie Heinz ?DOB: 10/22/1970 ?Encounter date: 12/03/2021 ? ?This is a 51 y.o. female who presents with ?Chief Complaint  ?Patient presents with  ? Hypertension  ? ? ?History of present illness: ?Last visit with me was 10/17/2021 for hypertension.  We added losartan 50 mg for better blood pressure control. She has done well with this and bp are looking better.  ? ? ?On high side 160 or on lower 130's side a little more frequently. Feels she is responded some to stress which is triggering those higher 160's. No chest pain, no chest pressure. Headache better, but still present. Really has had daily headaches for years. Did back off tylenol 6 mo ago- takes maybe twice a week and deals with headache other day. Debilitating just a couple of times a year.  ? ? ?Allergies  ?Allergen Reactions  ? Bactrim [Sulfamethoxazole-Trimethoprim] Other (See Comments)  ?  HALLUCINATIONS, FEVER, CHILLS  ? Ibuprofen Other (See Comments)  ?  Had gastric bypass. Told not to take NSAIDs.  ? Lamotrigine Hives  ? Sulfa Antibiotics Other (See Comments)  ?  HALLUCINATIONS, FEVER, CHILLS ? ?  ? ?Current Meds  ?Medication Sig  ? Acetaminophen (TYLENOL PO) Take 1 capsule by mouth every 8 (eight) hours.  ? Cyanocobalamin (B-12) 1000 MCG SUBL Place 1,000 mcg under the tongue daily.  ? DULoxetine (CYMBALTA) 60 MG capsule Take 120 mg by mouth every morning.   ? folic acid (FOLVITE) 1 MG tablet Take 1 tablet (1 mg total) by mouth daily.  ? losartan (COZAAR) 50 MG tablet Take 1 tablet (50 mg total) by mouth daily.  ? nystatin (MYCOSTATIN/NYSTOP) powder Apply 1 application. topically 3 (three) times daily. Apply to affected area for up to 7 days  ? traZODone (DESYREL) 100 MG tablet Take 1 tablet by mouth at bedtime as needed.  ? Vitamin D, Ergocalciferol, (DRISDOL) 1.25 MG (50000 UNIT) CAPS capsule Take 1 capsule (50,000 Units total) by mouth every 7 (seven) days.  ? ? ?Review of Systems  ?Constitutional:  Negative for chills, fatigue and fever.   ?Respiratory:  Negative for cough, chest tightness, shortness of breath and wheezing.   ?Cardiovascular:  Negative for chest pain, palpitations and leg swelling.  ?Neurological:  Positive for headaches.  ? ?Objective: ? ?BP 128/90 (BP Location: Left Arm, Patient Position: Sitting, Cuff Size: Large)   Pulse 73   Temp 97.7 ?F (36.5 ?C) (Oral)   Ht 5' 7.75" (1.721 m)   Wt 259 lb 9.6 oz (117.8 kg)   LMP 10/11/2019 (Approximate)   SpO2 99%   BMI 39.76 kg/m?   Weight: 259 lb 9.6 oz (117.8 kg)  ? ?BP Readings from Last 3 Encounters:  ?12/03/21 128/90  ?11/01/21 (!) 150/84  ?10/17/21 140/86  ? ?Wt Readings from Last 3 Encounters:  ?12/03/21 259 lb 9.6 oz (117.8 kg)  ?11/01/21 255 lb (115.7 kg)  ?10/17/21 254 lb 11.2 oz (115.5 kg)  ? ? ?Physical Exam ?Constitutional:   ?   General: She is not in acute distress. ?   Appearance: She is well-developed.  ?Cardiovascular:  ?   Rate and Rhythm: Normal rate and regular rhythm.  ?   Heart sounds: Normal heart sounds. No murmur heard. ?  No friction rub.  ?Pulmonary:  ?   Effort: Pulmonary effort is normal. No respiratory distress.  ?   Breath sounds: Normal breath sounds. No wheezing or rales.  ?Musculoskeletal:  ?   Right lower leg: No edema.  ?   Left lower  leg: No edema.  ?Neurological:  ?   Mental Status: She is alert and oriented to person, place, and time.  ?Psychiatric:     ?   Behavior: Behavior normal.  ? ? ?Assessment/Plan ? ?1. Hypertension, unspecified type ?Bp improved, but still suboptimal control. We are going to increase losartan to '100mg'$  daily. She will keep monitoring at hom.  ? ?2. B12 deficiency ?Improving. Continue w supplementation. ? ?3. Folate deficiency ?Improving. Continue with supplementation to support b12 replacement. ? ?4. Chronic nonintractable headache, unspecified headache type ?She has had regular headaches for years. Improvement in intensity with bp control. Discussed reviewing trigger foods (handout given) and avoiding those. Discussed  stopping tylenol altogether for next month to help eliminate med overuse as cause. Consider headache specialist if not improving with above.  ? ?5. Diabetes II:  ?She has been diet controlled but we discussed adding med for tighter sugar controls from preventative standpoint which she is agreeable to. Jardiance '10mg'$ daily samples given today. Discussed new medication(s) today with patient. Discussed potential side effects and patient verbalized understanding. If tolerating well, we can send in rx. Will need to continue to addresss preventative health issues with diabetes (now on losartan, should be on statin) ? ?Update me in 2 weeks with tolerance of jardiance and with bp numbers.  ? ? ? ? ? ?Micheline Rough, MD ?

## 2021-12-03 NOTE — Patient Instructions (Addendum)
*  if your pressures are regularly less than 245 systolic or 65 diastolic, I would consider backing off medication.  ? ?*update me in 2 weeks time with jardiance tolerance and your blood pressures.  ?

## 2021-12-26 ENCOUNTER — Encounter: Payer: Self-pay | Admitting: Family Medicine

## 2021-12-26 DIAGNOSIS — R319 Hematuria, unspecified: Secondary | ICD-10-CM

## 2021-12-28 MED ORDER — EMPAGLIFLOZIN 10 MG PO TABS: 10.0000 mg | ORAL_TABLET | Freq: Every day | ORAL | 1 refills | Status: DC

## 2022-01-01 ENCOUNTER — Telehealth: Payer: Self-pay | Admitting: *Deleted

## 2022-01-01 NOTE — Telephone Encounter (Signed)
[facility] faxed a request for a prior authorization for Jardiance '10mg'$ .  PA was sent to [email or web address] [email or web address] and note received stating: Approved today-CaseId:[id number];Status:Approved;Review Type:Prior Auth;Coverage Start Date:12/02/2021;Coverage End Date:01/01/2023  I spoke with Raven at [facility], informed her of this and she stated the pharmacy will contact the patient.

## 2022-01-18 ENCOUNTER — Ambulatory Visit: Payer: BC Managed Care – PPO | Admitting: Family Medicine

## 2022-01-18 ENCOUNTER — Encounter: Payer: Self-pay | Admitting: Family Medicine

## 2022-01-18 VITALS — BP 126/70 | HR 60 | Temp 97.5°F | Ht 67.75 in | Wt 251.3 lb

## 2022-01-18 DIAGNOSIS — R319 Hematuria, unspecified: Secondary | ICD-10-CM

## 2022-01-18 DIAGNOSIS — R300 Dysuria: Secondary | ICD-10-CM | POA: Diagnosis not present

## 2022-01-18 LAB — POC URINALSYSI DIPSTICK (AUTOMATED)
Bilirubin, UA: NEGATIVE
Blood, UA: POSITIVE
Glucose, UA: POSITIVE — AB
Ketones, UA: NEGATIVE
Nitrite, UA: NEGATIVE
Protein, UA: POSITIVE — AB
Spec Grav, UA: 1.02 (ref 1.010–1.025)
Urobilinogen, UA: 0.2 E.U./dL
pH, UA: 6 (ref 5.0–8.0)

## 2022-01-18 MED ORDER — NITROFURANTOIN MONOHYD MACRO 100 MG PO CAPS: 100.0000 mg | ORAL_CAPSULE | Freq: Two times a day (BID) | ORAL | 5 refills | Status: DC

## 2022-01-18 NOTE — Progress Notes (Signed)
Established Patient Office Visit  Subjective   Patient ID: [name], female    DOB: 30-Aug-1970  Age: 51 y.o. MRN: [id number]  Chief Complaint  Patient presents with   Hematuria    Patient complains of hematuria, x2 days     Dysuria    HPI   [name] is seen today with concerns for possible UTI.  Onset about earlier in the week of some urine frequency and burning with urination.  She had some pink-tinged urine past few days.  She is postmenopausal.  She does take Jardiance and went on this couple months ago for type 2 diabetes.  No recent flank pain.  No nausea or vomiting.  No fever.  No history of recent UTI.  Past Medical History:  Diagnosis Date   Allergy    Anxiety and depression    Arthritis    Clotting disorder ([place])    Complication of anesthesia    has been hard to wake up post-op   Dental crowns present    Depression    Diabetes ([place])    resolved with gastric bypass, no meds   DVT of lower extremity (deep venous thrombosis) ([place]) 2012   LLE   Dysmenorrhea    hx endometriosis and fibroid   Elevated prolactin level 2021   Family history of breast cancer    Headache(784.0)    tension or sinus   History of MRSA infection prior to 2012   lasted 5-6 yr.   Hx of nipple discharge    Bilateral.  Long standing.  Negative work up.    Hypertension    Jaw snapping    states jaw pops   Left foot drop 2016   --[place] neurology   Neuromuscular disorder ([place])    neuropathy in hands,feet,face   Neuropathy 2015   hands, feet and face--[place] neurology   Prothrombin gene mutation ([place]) 2018   single gene mutation   Right knee pain    hx meniscal injury, chondromalacia, OA   Thyroid nodule    Past Surgical History:  Procedure Laterality Date   CHOLECYSTECTOMY  1990's   DILITATION & CURRETTAGE/HYSTROSCOPY WITH NOVASURE ABLATION N/A 05/30/2014   Procedure: DILATATION & CURETTAGE/HYSTEROSCOPY WITH attempted [place] and with IUD removal ;  Surgeon: Cheryl Reas de Berton Lan, MD;  Location: [facility];  Service: Gynecology;  Laterality: N/A;   GASTRIC ROUX-EN-Y N/A 11/02/2013   Procedure: LAPAROSCOPIC ROUX-EN-Y GASTRIC BYPASS WITH UPPER ENDOSCOPY;  Surgeon: Cheryl Curry, MD;  Location: [facility];  Service: General;  Laterality: N/A;   HAND SURGERY Left    X 3 - spider bite and MRSA infection   HYSTEROSCOPY WITH RESECTOSCOPE N/A 05/30/2014   Procedure: HYSTEROSCOPY WITH RESECTOSCOPE;  Surgeon: Cheryl Reas de Berton Lan, MD;  Location: [facility];  Service: Gynecology;  Laterality: N/A;   KNEE ARTHROSCOPY Right 12/29/2012   Procedure: RIGHT KNEE ARTHROSCOPY  PARTIAL MEDIAL AND LATERAL MENISECTOMY WITH CHONDROPLASTY;  Surgeon: Cheryl Dibble, MD;  Location: [facility];  Service: Orthopedics;  Laterality: Right;   PELVIC LAPAROSCOPY  1990's   d/t endometriosis   TONSILLECTOMY  as child   TOTAL KNEE ARTHROPLASTY Left 07/22/2017   Procedure: TOTAL KNEE ARTHROPLASTY;  Surgeon: Cheryl Nakayama, MD;  Location: [facility];  Service: Orthopedics;  Laterality: Left;   TOTAL KNEE ARTHROPLASTY Right 05/26/2018   Procedure: RIGHT TOTAL KNEE ARTHROPLASTY;  Surgeon: Cheryl Nakayama, MD;  Location: [facility];  Service: Orthopedics;  Laterality: Right;  WRIST SURGERY Right 2005    reports that she has never smoked. She has never used smokeless tobacco. She reports current alcohol use of about 4.0 - 6.0 standard drinks of alcohol per week. She reports that she does not use drugs. family history includes Aneurysm (age of onset: 13) in her sister; Arthritis in her father; Breast cancer in her maternal grandmother and another family member; Breast cancer (age of onset: 44) in her mother; Diabetes in her maternal grandfather and mother; Diabetes (age of onset: 81) in her maternal grandmother; Healthy in her daughter and sister; High blood pressure in her sister; Hypertension in her mother; Kidney disease in her mother; Other in her paternal grandfather  and paternal grandmother; Stroke in her maternal grandfather. Allergies  Allergen Reactions   Bactrim [Sulfamethoxazole-Trimethoprim] Other (See Comments)    HALLUCINATIONS, FEVER, CHILLS   Ibuprofen Other (See Comments)    Had gastric bypass. Told not to take NSAIDs.   Lamotrigine Hives   Sulfa Antibiotics Other (See Comments)    HALLUCINATIONS, FEVER, CHILLS      Review of Systems  Constitutional:  Negative for chills and fever.  Gastrointestinal:  Negative for nausea and vomiting.  Genitourinary:  Positive for dysuria, frequency and hematuria. Negative for flank pain.      Objective:     BP 126/70 (BP Location: Left Arm, Patient Position: Sitting, Cuff Size: Large)   Pulse 60   Temp (!) 97.5 F (36.4 C) (Oral)   Ht 5' 7.75" (1.721 m)   Wt 251 lb 4.8 oz (114 kg)   LMP 10/11/2019 (Approximate)   SpO2 99%   BMI 38.49 kg/m    Physical Exam Vitals reviewed.  Cardiovascular:     Rate and Rhythm: Normal rate and regular rhythm.  Neurological:     Mental Status: She is alert.      Results for orders placed or performed in visit on 01/18/22  POCT Urinalysis Dipstick (Automated)  Result Value Ref Range   Color, UA yellow    Clarity, UA cloudy    Glucose, UA Positive (A) Negative   Bilirubin, UA negative    Ketones, UA negative    Spec Grav, UA 1.020 1.010 - 1.025   Blood, UA Positive    pH, UA 6.0 5.0 - 8.0   Protein, UA Positive (A) Negative   Urobilinogen, UA 0.2 0.2 or 1.0 E.U./dL   Nitrite, UA negative    Leukocytes, UA Moderate (2+) (A) Negative      The 10-year ASCVD risk score (Arnett DK, et al., 2019) is: 2.7%    Assessment & Plan:   Problem List Items Addressed This Visit   None Visit Diagnoses     Hematuria, unspecified type    -  Primary   Relevant Orders   POCT Urinalysis Dipstick (Automated) (Completed)   Dysuria       Relevant Orders   Urine Culture     1 week history of dysuria.  She did recently go on Jardiance for type 2  diabetes.  She has leukocytes and blood on dipstick and suspect uncomplicated cystitis.  Urine culture sent.  Start Macrobid 1 twice daily for 5 days.  Plenty of fluids.  Follow-up for any persistent or worsening symptoms.  No follow-ups on file.    Cheryl Littler, MD

## 2022-01-19 LAB — URINE CULTURE
MICRO NUMBER:: 13506373
SPECIMEN QUALITY:: ADEQUATE

## 2022-04-30 ENCOUNTER — Telehealth: Payer: Self-pay

## 2022-04-30 NOTE — Telephone Encounter (Signed)
Pt called and LVM in triage box re: finding of small bump in rt breast over the weekend and desired to know what her next steps should be.   Returned pt's call, no answer, left detailed msg per DPR informing her to make an OV for breast exam with provider and they will place orders for diagnostic imaging at that time. Informed her that I will send msg to scheduling and have them call her to set up appt.

## 2022-04-30 NOTE — Telephone Encounter (Signed)
FYI. Pt scheduled for 05/07/22.

## 2022-04-30 NOTE — Telephone Encounter (Signed)
Encounter reviewed and closed.  

## 2022-04-30 NOTE — Telephone Encounter (Signed)
Error

## 2022-05-06 NOTE — Progress Notes (Unsigned)
GYNECOLOGY  VISIT   HPI: 51 y.o.   Married  Caucasian  female   [phone] with Patient's last menstrual period was 10/11/2019 (approximate).   here for right breast mass.   GYNECOLOGIC HISTORY: Patient's last menstrual period was 10/11/2019 (approximate). Contraception:  PMP--female partner Menopausal hormone therapy:  none Last mammogram:  11-14-21 Neg/Birads1 Last pap smear:    03-20-21 ASCUS:Neg HR HPV, 09-19-17 Neg:Neg HR HPV, 04-29-14 Neg:Neg HR HPV        OB History     Gravida  3   Para  1   Term  1   Preterm      AB  2   Living  1      SAB  2   IAB      Ectopic      Multiple      Live Births                 Patient Active Problem List   Diagnosis Date Noted   Type 2 diabetes mellitus with diabetic polyneuropathy, without long-term current use of insulin ([place]) 12/03/2021   Hypertension 12/03/2021   B12 deficiency 12/03/2021   Primary osteoarthritis of right knee 05/26/2018   Bilateral nipple discharge 09/19/2017   Primary localized osteoarthritis of left knee 07/22/2017   Primary osteoarthritis of left knee 07/22/2017   IDA (iron deficiency anemia) 05/20/2017   Effusion of left knee 05/17/2017   Degenerative arthritis of right knee 02/13/2017   Degenerative arthritis of left knee 02/13/2017   Numbness and tingling of both lower extremities 10/18/2016   Genetic testing 10/18/2016   Family history of breast cancer    Multinodular goiter 04/24/2015   Mood disorder ([place]) 04/12/2015   Peroneal mononeuropathy 08/22/2014   Left foot drop 07/21/2014   Menorrhagia with irregular cycle 04/29/2014   S/P gastric bypass 11/02/2013   Hyperlipidemia 10/08/2013   Primary localized osteoarthrosis, lower leg 07/06/2013   Obesity, BMI unknown 04/23/2013   IUD contraception - inserted summer 2013 04/09/2013    Past Medical History:  Diagnosis Date   Allergy    Anxiety and depression    Arthritis    Clotting disorder ([place])    Complication of anesthesia    has  been hard to wake up post-op   Dental crowns present    Depression    Diabetes ([place])    resolved with gastric bypass, no meds   DVT of lower extremity (deep venous thrombosis) ([place]) 2012   LLE   Dysmenorrhea    hx endometriosis and fibroid   Elevated prolactin level 2021   Family history of breast cancer    Headache(784.0)    tension or sinus   History of MRSA infection prior to 2012   lasted 5-6 yr.   Hx of nipple discharge    Bilateral.  Long standing.  Negative work up.    Hypertension    Jaw snapping    states jaw pops   Left foot drop 2016   --[place] neurolog[facility]   Neuromuscular disorder ([place])    neuropathy in hands,feet,face   Neuropathy 2015   hands, feet and face--[place] neurology   Prothrombin gene mutation ([place]) 2018   single gene mutation   Right knee pain    hx meniscal injury, chondromalacia, OA   Thyroid nodule     Past Surgical History:  Procedure Laterality Date   CHOLECYSTECTOMY  1990's   DILITATION & CURRETTAGE/HYSTROSCOPY WITH NOVASURE ABLATION N/A 05/30/2014   Procedure: DILATATION & CURETTAGE/HYSTEROSCOPY WITH  attempted NOVASURE ABLATION and with IUD removal ;  Surgeon: Jamey Reas de Berton Lan, MD;  Location: [facility];  Service: Gynecology;  Laterality: N/A;   GASTRIC ROUX-EN-Y N/A 11/02/2013   Procedure: LAPAROSCOPIC ROUX-EN-Y GASTRIC BYPASS WITH UPPER ENDOSCOPY;  Surgeon: Gayland Curry, MD;  Location: [facility];  Service: General;  Laterality: N/A;   HAND SURGERY Left    X 3 - spider bite and MRSA infection   HYSTEROSCOPY WITH RESECTOSCOPE N/A 05/30/2014   Procedure: HYSTEROSCOPY WITH RESECTOSCOPE;  Surgeon: Jamey Reas de Berton Lan, MD;  Location: [facility];  Service: Gynecology;  Laterality: N/A;   KNEE ARTHROSCOPY Right 12/29/2012   Procedure: RIGHT KNEE ARTHROSCOPY  PARTIAL MEDIAL AND LATERAL MENISECTOMY WITH CHONDROPLASTY;  Surgeon: Hessie Dibble, MD;  Location: [facility];  Service: Orthopedics;  Laterality:  Right;   PELVIC LAPAROSCOPY  1990's   d/t endometriosis   TONSILLECTOMY  as child   TOTAL KNEE ARTHROPLASTY Left 07/22/2017   Procedure: TOTAL KNEE ARTHROPLASTY;  Surgeon: Melrose Nakayama, MD;  Location: [facility];  Service: Orthopedics;  Laterality: Left;   TOTAL KNEE ARTHROPLASTY Right 05/26/2018   Procedure: RIGHT TOTAL KNEE ARTHROPLASTY;  Surgeon: Melrose Nakayama, MD;  Location: [facility];  Service: Orthopedics;  Laterality: Right;   WRIST SURGERY Right 2005    Current Outpatient Medications  Medication Sig Dispense Refill   Acetaminophen (TYLENOL PO) Take 1 capsule by mouth every 8 (eight) hours.     Cyanocobalamin (B-12) 1000 MCG SUBL Place 1,000 mcg under the tongue daily. 90 tablet 1   DULoxetine (CYMBALTA) 60 MG capsule Take 120 mg by mouth every morning.      empagliflozin (JARDIANCE) 10 MG TABS tablet Take 1 tablet (10 mg total) by mouth daily before breakfast. 90 tablet 1   folic acid (FOLVITE) 1 MG tablet Take 1 tablet (1 mg total) by mouth daily. 90 tablet 1   losartan (COZAAR) 100 MG tablet Take 1 tablet (100 mg total) by mouth daily. 90 tablet 2   Multiple Vitamin (MULTIVITAMIN ADULT PO) Take by mouth.     nitrofurantoin, macrocrystal-monohydrate, (MACROBID) 100 MG capsule Take 1 capsule (100 mg total) by mouth 2 (two) times daily. 10 capsule 5   nystatin (MYCOSTATIN/NYSTOP) powder Apply 1 application. topically 3 (three) times daily. Apply to affected area for up to 7 days 30 g 3   traZODone (DESYREL) 100 MG tablet Take 1 tablet by mouth at bedtime as needed.     Vitamin D, Ergocalciferol, (DRISDOL) 1.25 MG (50000 UNIT) CAPS capsule Take 1 capsule (50,000 Units total) by mouth every 7 (seven) days. 12 capsule 1   No current facility-administered medications for this visit.     ALLERGIES: Bactrim [sulfamethoxazole-trimethoprim], Ibuprofen, Lamotrigine, and Sulfa antibiotics  Family History  Problem Relation Age of Onset   Diabetes Mother        Living   Kidney disease Mother     Hypertension Mother    Breast cancer Mother 66       dx'd with breast ca x2; 2nd around 40; 26rd 60's   Diabetes Maternal Grandmother 64       dec   Breast cancer Maternal Grandmother        dx in her 26s   Arthritis Father    Healthy Sister    Aneurysm Sister 58       brain   High blood pressure Sister    Diabetes Maternal Grandfather    Stroke Maternal Grandfather  Other Paternal Grandmother        died in childbirth   Other Paternal Grandfather        unknown   Healthy Daughter    Breast cancer Other        2 great aunts   Thyroid disease Neg Hx     Social History   Socioeconomic History   Marital status: Married    Spouse name: Not on file   Number of children: Not on file   Years of education: Not on file   Highest education level: Not on file  Occupational History   Not on file  Tobacco Use   Smoking status: Never   Smokeless tobacco: Never  Vaping Use   Vaping Use: Never used  Substance and Sexual Activity   Alcohol use: Yes    Alcohol/week: 4.0 - 6.0 standard drinks of alcohol    Types: 4 - 6 Glasses of wine per week   Drug use: No   Sexual activity: Yes    Partners: Female    Comment: female partner  Other Topics Concern   Not on file  Social History Narrative   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, jewellery level of education:  Masters      Home Situation: lives with daughter      Spiritual Beliefs: none      Lifestyle: no regular exercising; diet is so so      Caffeine use: daily            Social Determinants of Health   Financial Resource Strain: Not on file  Food Insecurity: Not on file  Transportation Needs: Not on file  Physical Activity: Not on file  Stress: Not on file  Social Connections: Not on file  Intimate Partner Violence: Not on file    Review of Systems  PHYSICAL EXAMINATION:    LMP 10/11/2019 (Approximate)     General appearance: alert, cooperative and appears stated age Head: Normocephalic, without  obvious abnormality, atraumatic Neck: no adenopathy, supple, symmetrical, trachea midline and thyroid normal to inspection and palpation Lungs: clear to auscultation bilaterally Breasts: normal appearance, no masses or tenderness, No nipple retraction or dimpling, No nipple discharge or bleeding, No axillary or supraclavicular adenopathy Heart: regular rate and rhythm Abdomen: soft, non-tender, no masses,  no organomegaly Extremities: extremities normal, atraumatic, no cyanosis or edema Skin: Skin color, texture, turgor normal. No rashes or lesions Lymph nodes: Cervical, supraclavicular, and axillary nodes normal. No abnormal inguinal nodes palpated Neurologic: Grossly normal  Pelvic: External genitalia:  no lesions              Urethra:  normal appearing urethra with no masses, tenderness or lesions              Bartholins and Skenes: normal                 Vagina: normal appearing vagina with normal color and discharge, no lesions              Cervix: no lesions                Bimanual Exam:  Uterus:  normal size, contour, position, consistency, mobility, non-tender              Adnexa: no mass, fullness, tenderness              Rectal exam: {yes no:[id number]}.  Confirms.  Anus:  normal sphincter tone, no lesions  Chaperone was present for exam:  ***  ASSESSMENT     PLAN     An After Visit Summary was printed and given to the patient.  ______ minutes face to face time of which over 50% was spent in counseling.

## 2022-05-07 ENCOUNTER — Telehealth: Payer: Self-pay | Admitting: Obstetrics and Gynecology

## 2022-05-07 ENCOUNTER — Other Ambulatory Visit: Payer: Self-pay | Admitting: Obstetrics and Gynecology

## 2022-05-07 ENCOUNTER — Ambulatory Visit: Payer: BC Managed Care – PPO | Admitting: Obstetrics and Gynecology

## 2022-05-07 ENCOUNTER — Encounter: Payer: Self-pay | Admitting: Obstetrics and Gynecology

## 2022-05-07 VITALS — BP 142/80 | HR 80 | Ht 67.5 in | Wt 255.0 lb

## 2022-05-07 DIAGNOSIS — N6314 Unspecified lump in the right breast, lower inner quadrant: Secondary | ICD-10-CM

## 2022-05-07 DIAGNOSIS — N63 Unspecified lump in unspecified breast: Secondary | ICD-10-CM

## 2022-05-07 NOTE — Telephone Encounter (Signed)
Encounter reviewed and closed.  

## 2022-05-07 NOTE — Telephone Encounter (Signed)
Please schedule a dx right mammogram and right breast ultrasound for a right breast mass at 4:00 in two areas, one close to the areola and one about 7 - 8 cm lateral to this.   She has increased risk of breast cancer.   She is also due to have a bilateral breast MRI which is a follow up study.   The right dx mammogram and [place] will need to be done first.

## 2022-05-07 NOTE — Telephone Encounter (Addendum)
Orders placed.  Appointment scheduled at [facility] for [place]., 06/17/2022 at 1:40 PM to arrive at 1:20pm  Spoke with patient and informed her. I told her the scheduler said she should call for cancellation as they do get cancellation. I provided patient the phone # to diagnostic breast scheduler and she said she will call daily.  I reminded her with this is all complete she will need her MRI scheduled.

## 2022-05-13 ENCOUNTER — Encounter: Payer: Self-pay | Admitting: Family Medicine

## 2022-05-13 ENCOUNTER — Ambulatory Visit: Payer: BC Managed Care – PPO | Admitting: Family Medicine

## 2022-05-13 VITALS — BP 130/82 | HR 85 | Temp 98.2°F | Wt 252.0 lb

## 2022-05-13 DIAGNOSIS — L0292 Furuncle, unspecified: Secondary | ICD-10-CM | POA: Diagnosis not present

## 2022-05-13 MED ORDER — MUPIROCIN 2 % EX OINT: 1.0000 | TOPICAL_OINTMENT | Freq: Two times a day (BID) | CUTANEOUS | 1 refills | Status: DC

## 2022-05-13 MED ORDER — DOXYCYCLINE HYCLATE 100 MG PO CAPS: 100.0000 mg | ORAL_CAPSULE | Freq: Two times a day (BID) | ORAL | 0 refills | Status: AC

## 2022-05-13 NOTE — Progress Notes (Signed)
   Subjective:    Patient ID: [name], female    DOB: [birth year], 51 y.o.   MRN: [id number]  HPI Here for 2 months of a non-healing wound on the left lateral thigh. She has a hx of MRSA infections in the past. She has also had recurrent boils on the thighs the past few years. Usually they come up and then heal over 3-4 weeks. ,but this latest one will not heal. She hs diabetes, but it has been fairly well controlled. Her A1c in April was 7.1%. She denies any red streaking on the leg, no fevers.    Review of Systems  Constitutional: Negative.   Respiratory: Negative.    Cardiovascular: Negative.   Skin:  Positive for wound.       Objective:   Physical Exam Constitutional:      Appearance: Normal appearance. She is not ill-appearing.  Cardiovascular:     Rate and Rhythm: Normal rate and regular rhythm.     Pulses: Normal pulses.     Heart sounds: Normal heart sounds.  Pulmonary:     Effort: Pulmonary effort is normal.     Breath sounds: Normal breath sounds.  Skin:    Comments: There is a 1 cm ulcerated lesion on the left lateral thigh that has some granulation tissue on the base. No drainage. There are multiple scars from such lesions that have healed on both thighs.   Neurological:     Mental Status: She is alert.           Assessment & Plan:  Recurrent boils, likely due to MRSA. We will treat with 10 days of Doxycycline 100 mg BID. She will also apply Mupiricin ointment to the wound BID and she will apply this inside both nostrils BID for 10 days.  Alysia Penna, MD

## 2022-05-29 ENCOUNTER — Encounter: Payer: BC Managed Care – PPO | Admitting: Family Medicine

## 2022-06-12 ENCOUNTER — Encounter: Payer: Self-pay | Admitting: Family Medicine

## 2022-06-12 ENCOUNTER — Ambulatory Visit: Payer: BC Managed Care – PPO | Admitting: Family Medicine

## 2022-06-12 VITALS — BP 134/82 | HR 75 | Temp 97.6°F | Ht 67.5 in | Wt 248.4 lb

## 2022-06-12 DIAGNOSIS — Z23 Encounter for immunization: Secondary | ICD-10-CM

## 2022-06-12 DIAGNOSIS — E1142 Type 2 diabetes mellitus with diabetic polyneuropathy: Secondary | ICD-10-CM | POA: Diagnosis not present

## 2022-06-12 DIAGNOSIS — I10 Essential (primary) hypertension: Secondary | ICD-10-CM | POA: Diagnosis not present

## 2022-06-12 LAB — MICROALBUMIN / CREATININE URINE RATIO
Creatinine,U: 90.2 mg/dL
Microalb Creat Ratio: 0.8 mg/g (ref 0.0–30.0)
Microalb, Ur: <0.7 mg/dL (ref 0.0–1.9)

## 2022-06-12 LAB — POCT GLYCOSYLATED HEMOGLOBIN (HGB A1C): Hemoglobin A1C: 6.3 % — AB (ref 4.0–5.6)

## 2022-06-12 MED ORDER — EMPAGLIFLOZIN 10 MG PO TABS: 10.0000 mg | ORAL_TABLET | Freq: Every day | ORAL | 3 refills | Status: DC

## 2022-06-12 NOTE — Progress Notes (Unsigned)
[id number]

## 2022-06-12 NOTE — Progress Notes (Signed)
Established Patient Office Visit  Subjective   Patient ID: [name], female    DOB: [birth year]  Age: 51 y.o. MRN: [id number]  Chief Complaint  Patient presents with   Establish Care    Patient is here for transition of care visit. She has no new symptoms or new issues that she would like to discuss.   DM- Patient has been taking her jardiance '10mg'$  daily, states that she is not having any side effects to the medication. A1C performed in office today at is 6.3, much improved since the last visit. Patient states her wife is also on a diet so she is watching what she is eating.   HTN -- BP in office performed and is well controlled. She  reports no side effects to the medications, no chest pain, SOB, dizziness or headaches. She has a BP cuff at home and is checking BP regularly, reports they are in the normal range.       Current Outpatient Medications  Medication Instructions   Acetaminophen (TYLENOL PO) 1 capsule, Oral, Every 8 hours   B-12 1,000 mcg, Sublingual, Daily   DULoxetine (CYMBALTA) 120 mg, Oral, Every morning   empagliflozin (JARDIANCE) 10 mg, Oral, Daily before breakfast   folic acid (FOLVITE) 1 mg, Oral, Daily   losartan (COZAAR) 100 mg, Oral, Daily   Multiple Vitamin (MULTIVITAMIN ADULT PO) Oral   mupirocin ointment (BACTROBAN) 2 % 1 Application, Topical, 2 times daily   nystatin (MYCOSTATIN/NYSTOP) powder 1 application , Topical, 3 times daily, Apply to affected area for up to 7 days   traZODone (DESYREL) 100 MG tablet 1 tablet, Oral, At bedtime PRN    Patient Active Problem List   Diagnosis Date Noted   Type 2 diabetes mellitus with diabetic polyneuropathy, without long-term current use of insulin ([place]) 12/03/2021   Hypertension 12/03/2021   B12 deficiency 12/03/2021   Primary osteoarthritis of right knee 05/26/2018   Bilateral nipple discharge 09/19/2017   Primary localized osteoarthritis of left knee 07/22/2017   Primary osteoarthritis of left knee  07/22/2017   IDA (iron deficiency anemia) 05/20/2017   Effusion of left knee 05/17/2017   Degenerative arthritis of right knee 02/13/2017   Degenerative arthritis of left knee 02/13/2017   Numbness and tingling of both lower extremities 10/18/2016   Genetic testing 10/18/2016   Family history of breast cancer    Multinodular goiter 04/24/2015   Mood disorder ([place]) 04/12/2015   Peroneal mononeuropathy 08/22/2014   Left foot drop 07/21/2014   Menorrhagia with irregular cycle 04/29/2014   S/P gastric bypass 11/02/2013   Hyperlipidemia 10/08/2013   Primary localized osteoarthrosis, lower leg 07/06/2013   Obesity, BMI unknown 04/23/2013   IUD contraception - inserted summer 2013 04/09/2013      Review of Systems  All other systems reviewed and are negative.     Objective:     BP 134/82 (BP Location: Left Arm, Patient Position: Sitting, Cuff Size: Large)   Pulse 75   Temp 97.6 F (36.4 C) (Oral)   Ht 5' 7.5" (1.715 m)   Wt 248 lb 6.4 oz (112.7 kg)   LMP 10/11/2019 (Approximate)   SpO2 100%   BMI 38.33 kg/m  BP Readings from Last 3 Encounters:  06/12/22 134/82  05/13/22 130/82  05/07/22 (!) 142/80   Wt Readings from Last 3 Encounters:  06/12/22 248 lb 6.4 oz (112.7 kg)  05/13/22 252 lb (114.3 kg)  05/07/22 255 lb (115.7 kg)      Physical Exam  Vitals reviewed.  Constitutional:      Appearance: Normal appearance. She is well-groomed. She is obese.  Eyes:     Conjunctiva/sclera: Conjunctivae normal.  Neck:     Thyroid: No thyromegaly.  Cardiovascular:     Rate and Rhythm: Normal rate and regular rhythm.     Pulses: Normal pulses.     Heart sounds: S1 normal and S2 normal.  Pulmonary:     Effort: Pulmonary effort is normal.     Breath sounds: Normal breath sounds and air entry.  Abdominal:     General: Bowel sounds are normal.  Musculoskeletal:     Right lower leg: No edema.     Left lower leg: No edema.  Neurological:     Mental Status: She is alert and  oriented to person, place, and time. Mental status is at baseline.     Gait: Gait is intact.  Psychiatric:        Mood and Affect: Mood and affect normal.        Speech: Speech normal.        Behavior: Behavior normal.        Judgment: Judgment normal.      Results for orders placed or performed in visit on 06/12/22  POC HgB A1c  Result Value Ref Range   Hemoglobin A1C 6.3 (A) 4.0 - 5.6 %   HbA1c POC (<> result, manual entry)     HbA1c, POC (prediabetic range)     HbA1c, POC (controlled diabetic range)      {Labs (Optional):23779}  The 10-year ASCVD risk score (Arnett DK, et al., 2019) is: 3%    Assessment & Plan:   Problem List Items Addressed This Visit       Cardiovascular and Mediastinum   Hypertension     Endocrine   Type 2 diabetes mellitus with diabetic polyneuropathy, without long-term current use of insulin ([facility])   Relevant Orders   POC HgB A1c (Completed)   Other Visit Diagnoses     Need for immunization against influenza    -  Primary   Relevant Orders   Flu Vaccine QUAD 6+ mos PF IM (Fluarix Quad PF) (Completed)       No follow-ups on file.    Farrel Conners, MD

## 2022-06-13 NOTE — Assessment & Plan Note (Signed)
A1C performed in office today and is 6.3, improved since last checked. Advised she continue her current eating pattern and continue jardiance 10 mg daily. Will see her back in 6 months for A1C recheck.

## 2022-06-13 NOTE — Progress Notes (Signed)
Urine WNL

## 2022-06-13 NOTE — Assessment & Plan Note (Signed)
Current hypertension medications:       Sig   losartan (COZAAR) 100 MG tablet (Taking) Take 1 tablet (100 mg total) by mouth daily.      BP is well controlled on the above medication. Will continue this as prescribed.

## 2022-06-19 ENCOUNTER — Ambulatory Visit
Admission: RE | Admit: 2022-06-19 | Discharge: 2022-06-19 | Disposition: A | Discharge status: Home / Self Care | Payer: BC Managed Care – PPO | Source: Ambulatory Visit | Attending: Obstetrics and Gynecology | Admitting: Obstetrics and Gynecology

## 2022-06-19 ENCOUNTER — Telehealth: Payer: Self-pay | Admitting: Obstetrics and Gynecology

## 2022-06-19 DIAGNOSIS — N6314 Unspecified lump in the right breast, lower inner quadrant: Secondary | ICD-10-CM

## 2022-06-19 DIAGNOSIS — Z803 Family history of malignant neoplasm of breast: Secondary | ICD-10-CM | POA: Diagnosis not present

## 2022-06-19 DIAGNOSIS — R92321 Mammographic fibroglandular density, right breast: Secondary | ICD-10-CM | POA: Diagnosis not present

## 2022-06-19 DIAGNOSIS — Z9189 Other specified personal risk factors, not elsewhere classified: Secondary | ICD-10-CM

## 2022-06-19 NOTE — Telephone Encounter (Signed)
Please precert and schedule breast MRI with contrast.   My patient has increased risk of breast cancer, 22.1%.

## 2022-06-20 NOTE — Telephone Encounter (Signed)
Call placed to patient, left detailed message, ok per dpr. Advised order placed for MRI as recommended per Dr. Quincy Simmonds. Please contact [facility] at [phone] to schedule. MRI will be precerted once scheduled. If you have any additional questions, return call to office at [phone], OPT 4.

## 2022-06-24 ENCOUNTER — Encounter: Payer: Self-pay | Admitting: Family Medicine

## 2022-06-24 ENCOUNTER — Ambulatory Visit: Payer: BC Managed Care – PPO | Admitting: Family Medicine

## 2022-06-24 VITALS — BP 136/80 | HR 70 | Temp 98.8°F | Ht 67.5 in | Wt 250.8 lb

## 2022-06-24 DIAGNOSIS — Z23 Encounter for immunization: Secondary | ICD-10-CM | POA: Diagnosis not present

## 2022-06-24 DIAGNOSIS — S61412A Laceration without foreign body of left hand, initial encounter: Secondary | ICD-10-CM | POA: Diagnosis not present

## 2022-06-24 NOTE — Progress Notes (Signed)
   Established Patient Office Visit  Subjective   Patient ID: [name], female    DOB: 1970-12-20  Age: 51 y.o. MRN: [id number]  Chief Complaint  Patient presents with   Laceration    Patient states she cut the left index finger last night while cooking, complains of pain and states bleeding has resolved, especially concerned due history of MRSA    Patient was cutting a tie off of a herb bag. States that the blade slipped and she cut her left index finger close to the knuckle. She reports that it bled quite a bit, she applied a bandaid and called the triage nurse who reported that she would need a tetanus booster. Last tetanus shot was in 2014. She was concerned because she had surgery on this finger and had a history of MRSA infection in that finger.      Review of Systems  All other systems reviewed and are negative.     Objective:     BP 136/80 (BP Location: Left Arm, Patient Position: Sitting, Cuff Size: Large)   Pulse 70   Temp 98.8 F (37.1 C) (Oral)   Ht 5' 7.5" (1.715 m)   Wt 250 lb 12.8 oz (113.8 kg)   LMP 10/11/2019 (Approximate)   SpO2 99%   BMI 38.70 kg/m    Physical Exam Skin:    Findings: Laceration (left dorsal index finger, about 4 mm in length and irregular, no surrounding erythema, edema, no drainage) present.      No results found for any visits on 06/24/22.    The 10-year ASCVD risk score (Arnett DK, et al., 2019) is: 3.1%    Assessment & Plan:   Problem List Items Addressed This Visit   None Visit Diagnoses     Laceration of skin of left hand, initial encounter    -  Primary   Relevant Orders   No need for wound closure, the pieces of skin are well approximated without sutures. I recommended using daily dressing changes with kerlex and apply abx ointment daily. Tetanus shot ordered today.  Tdap vaccine greater than or equal to 7yo IM       No follow-ups on file.    Farrel Conners, MD

## 2022-07-01 NOTE — Telephone Encounter (Signed)
Breast MRI ON 07/20/22.

## 2022-07-03 ENCOUNTER — Telehealth: Payer: Self-pay | Admitting: Family Medicine

## 2022-07-03 NOTE — Telephone Encounter (Signed)
Spoke [name] with at the pharmacy and she stated the Rx was received and she tried re-processing the Rx.  She resubmitted and stated the patient will be sent a message as to the status.  I called the patient and informed her of this.

## 2022-07-03 NOTE — Telephone Encounter (Signed)
Pt is calling and pharm said they does not have empagliflozin (JARDIANCE) 10 MG TABS tablet  please resend to  [id number] - [place], [place] - [address] [place] Phone: [phone]  Fax: [phone]

## 2022-07-20 ENCOUNTER — Ambulatory Visit
Admission: RE | Admit: 2022-07-20 | Discharge: 2022-07-20 | Disposition: A | Discharge status: Home / Self Care | Payer: BC Managed Care – PPO | Source: Ambulatory Visit | Attending: Obstetrics and Gynecology | Admitting: Obstetrics and Gynecology

## 2022-07-20 DIAGNOSIS — Z803 Family history of malignant neoplasm of breast: Secondary | ICD-10-CM

## 2022-07-20 DIAGNOSIS — N6314 Unspecified lump in the right breast, lower inner quadrant: Secondary | ICD-10-CM | POA: Diagnosis not present

## 2022-07-20 DIAGNOSIS — Z9189 Other specified personal risk factors, not elsewhere classified: Secondary | ICD-10-CM

## 2022-07-20 DIAGNOSIS — N6322 Unspecified lump in the left breast, upper inner quadrant: Secondary | ICD-10-CM | POA: Diagnosis not present

## 2022-07-20 MED ORDER — GADOPICLENOL 0.5 MMOL/ML IV SOLN
10.0000 mL | Freq: Once | INTRAVENOUS | Status: AC | PRN
  Administered 2022-07-20: 10 mL via INTRAVENOUS

## 2022-09-05 ENCOUNTER — Encounter: Payer: Self-pay | Admitting: Family Medicine

## 2022-09-05 ENCOUNTER — Ambulatory Visit: Payer: BC Managed Care – PPO | Admitting: Family Medicine

## 2022-09-05 VITALS — BP 130/88 | HR 84 | Temp 99.2°F | Ht 67.5 in | Wt 245.6 lb

## 2022-09-05 DIAGNOSIS — J0110 Acute frontal sinusitis, unspecified: Secondary | ICD-10-CM

## 2022-09-05 DIAGNOSIS — J029 Acute pharyngitis, unspecified: Secondary | ICD-10-CM | POA: Diagnosis not present

## 2022-09-05 LAB — POCT RAPID STREP A (OFFICE): Rapid Strep A Screen: NEGATIVE

## 2022-09-05 MED ORDER — PSEUDOEPHEDRINE-GUAIFENESIN 30-100 MG/5ML PO SYRP: 5.0000 mL | ORAL_SOLUTION | ORAL | 0 refills | Status: DC | PRN

## 2022-09-05 MED ORDER — AMOXICILLIN-POT CLAVULANATE 875-125 MG PO TABS: 1.0000 | ORAL_TABLET | Freq: Two times a day (BID) | ORAL | 0 refills | Status: AC

## 2022-09-05 NOTE — Progress Notes (Signed)
b  Acute Office Visit  Subjective:     Patient ID: [name], female    DOB: January 28, 1971, 52 y.o.   MRN: [id number]  Chief Complaint  Patient presents with   Sore Throat    X3 weeks   Shortness of Breath    X2 weeks   Headache    X1 month, tried Tylenol with some relief and states the home Covid tests were negative   Generalized Body Aches   Dizziness    X2 days   Chills    Intermittently x2 weeks, temperature not taken at home   Cough    Productive with clear-yellow sputum x3 weeks    Pt reports that she felt like she has had flu like symptoms for the past 3 weeks. Pt reports she did 2 home tests for covid and both were negative. Pt reports her chest is feeling very heavy and she reports neck pain with movement and headache. Pt tonsils are surgically absent. No difficulty swallowing. Lots of nasal drainage. Pt reports subjective fever/chills off and on.    Review of Systems  Respiratory:  Positive for cough and sputum production.   All other systems reviewed and are negative.       Objective:    BP 130/88 (BP Location: Left Arm, Patient Position: Sitting, Cuff Size: Large)   Pulse 84   Temp 99.2 F (37.3 C) (Oral)   Ht 5' 7.5" (1.715 m)   Wt 245 lb 9.6 oz (111.4 kg)   LMP 10/11/2019 (Approximate)   SpO2 99%   BMI 37.90 kg/m    Physical Exam Vitals reviewed.  Constitutional:      Appearance: Normal appearance. She is well-groomed and normal weight.  HENT:     Right Ear: Tympanic membrane and ear canal normal.     Left Ear: Tympanic membrane normal.     Mouth/Throat:     Mouth: Mucous membranes are moist.     Pharynx: No posterior oropharyngeal erythema.  Eyes:     Conjunctiva/sclera: Conjunctivae normal.  Cardiovascular:     Rate and Rhythm: Normal rate and regular rhythm.     Pulses: Normal pulses.     Heart sounds: S1 normal and S2 normal.  Pulmonary:     Effort: Pulmonary effort is normal.     Breath sounds: Normal breath sounds and air  entry.  Abdominal:     General: Bowel sounds are normal.  Musculoskeletal:     Right lower leg: No edema.     Left lower leg: No edema.  Neurological:     Mental Status: She is alert and oriented to person, place, and time. Mental status is at baseline.     Gait: Gait is intact.  Psychiatric:        Mood and Affect: Mood and affect normal.        Speech: Speech normal.        Behavior: Behavior normal.        Judgment: Judgment normal.     Results for orders placed or performed in visit on 09/05/22  POC Rapid Strep A  Result Value Ref Range   Rapid Strep A Screen Negative Negative        Assessment & Plan:   Problem List Items Addressed This Visit   None Visit Diagnoses     Sore throat    -  Primary   Relevant Orders   POC Rapid Strep A (Completed)   Acute non-recurrent frontal sinusitis  Relevant Medications   amoxicillin-clavulanate (AUGMENTIN) 875-125 MG tablet   pseudoephedrine-guaifenesin (TUSSIN PE) 30-100 MG/5ML SYRP      Most likely diagnosis given the continuous nasal drainage and frontal headaches. Will treat with medications below.  Meds ordered this encounter  Medications   amoxicillin-clavulanate (AUGMENTIN) 875-125 MG tablet    Sig: Take 1 tablet by mouth 2 (two) times daily for 7 days.    Dispense:  14 tablet    Refill:  0   pseudoephedrine-guaifenesin (TUSSIN PE) 30-100 MG/5ML SYRP    Sig: Take 5 mLs by mouth every 4 (four) hours as needed.    Dispense:  240 mL    Refill:  0    No follow-ups on file.  Farrel Conners, MD

## 2022-09-10 ENCOUNTER — Encounter: Payer: Self-pay | Admitting: Family Medicine

## 2022-09-27 ENCOUNTER — Encounter: Payer: Self-pay | Admitting: Family Medicine

## 2022-09-27 ENCOUNTER — Ambulatory Visit: Payer: BC Managed Care – PPO | Admitting: Family Medicine

## 2022-09-27 VITALS — BP 144/90 | HR 90 | Temp 97.6°F | Ht 67.5 in | Wt 246.2 lb

## 2022-09-27 DIAGNOSIS — J209 Acute bronchitis, unspecified: Secondary | ICD-10-CM | POA: Diagnosis not present

## 2022-09-27 DIAGNOSIS — Z0001 Encounter for general adult medical examination with abnormal findings: Secondary | ICD-10-CM | POA: Diagnosis not present

## 2022-09-27 DIAGNOSIS — I10 Essential (primary) hypertension: Secondary | ICD-10-CM

## 2022-09-27 MED ORDER — ALBUTEROL SULFATE HFA 108 (90 BASE) MCG/ACT IN AERS: 2.0000 | INHALATION_SPRAY | Freq: Four times a day (QID) | RESPIRATORY_TRACT | 0 refills | Status: DC | PRN

## 2022-09-27 MED ORDER — PREDNISONE 20 MG PO TABS: ORAL_TABLET | ORAL | 0 refills | Status: AC

## 2022-09-27 MED ORDER — LOSARTAN POTASSIUM 100 MG PO TABS: 100.0000 mg | ORAL_TABLET | Freq: Every day | ORAL | 3 refills | Status: DC

## 2022-09-27 NOTE — Progress Notes (Unsigned)
Complete physical exam  Patient: [name]   DOB: [birth year]   52 y.o. Female  MRN: [id number]  Subjective:    Chief Complaint  Patient presents with   Medical Management of Chronic Issues    [name] is a 52 y.o. female who presents today for a complete physical exam. She reports consuming a general diet. Home exercise routine includes walking and biking usually. She generally feels well. She reports sleeping well. She does not have additional problems to discuss today.   Pt is reporting continued cough since she was seen last for sinus infection. States that she thought the abx would help but they didn't really help. Denies SOB, no fever/chills. No sputum production or chest pain.  Most recent fall risk assessment:    05/13/2022    3:38 PM  [place] in the past year? 0  Number falls in past yr: 0  Injury with Fall? 0  Risk for fall due to : No Fall Risks  Follow up Falls evaluation completed     Most recent depression screenings:    06/12/2022   11:24 AM 05/13/2022    3:38 PM  PHQ 2/9 Scores  PHQ - 2 Score 1 0  PHQ- 9 Score 4 4    Vision:Within last year and Dental: No current dental problems and Receives regular dental care  Patient Active Problem List   Diagnosis Date Noted   Type 2 diabetes mellitus with diabetic polyneuropathy, without long-term current use of insulin ([place]) 12/03/2021   Hypertension 12/03/2021   B12 deficiency 12/03/2021   Primary osteoarthritis of right knee 05/26/2018   Bilateral nipple discharge 09/19/2017   Primary localized osteoarthritis of left knee 07/22/2017   Primary osteoarthritis of left knee 07/22/2017   IDA (iron deficiency anemia) 05/20/2017   Effusion of left knee 05/17/2017   Degenerative arthritis of right knee 02/13/2017   Degenerative arthritis of left knee 02/13/2017   Numbness and tingling of both lower extremities 10/18/2016   Genetic testing 10/18/2016   Family history of breast cancer     Multinodular goiter 04/24/2015   Mood disorder ([place]) 04/12/2015   Peroneal mononeuropathy 08/22/2014   Left foot drop 07/21/2014   Menorrhagia with irregular cycle 04/29/2014   S/P gastric bypass 11/02/2013   Hyperlipidemia 10/08/2013   Primary localized osteoarthrosis, lower leg 07/06/2013   Obesity, BMI unknown 04/23/2013   IUD contraception - inserted summer 2013 04/09/2013      Patient Care Team: Farrel Conners, MD as PCP - General (Family Medicine) Himmelrich, Bryson Ha, RD (Inactive) as Dietitian Philemon Kingdom, MD as Consulting Physician (Internal Medicine)   Outpatient Medications Prior to Visit  Medication Sig   Acetaminophen (TYLENOL PO) Take 1 capsule by mouth every 8 (eight) hours.   Cyanocobalamin (B-12) 1000 MCG SUBL Place 1,000 mcg under the tongue daily.   DULoxetine (CYMBALTA) 60 MG capsule Take 120 mg by mouth every morning.    empagliflozin (JARDIANCE) 10 MG TABS tablet Take 1 tablet (10 mg total) by mouth daily before breakfast.   folic acid (FOLVITE) 1 MG tablet Take 1 tablet (1 mg total) by mouth daily.   Multiple Vitamin (MULTIVITAMIN ADULT PO) Take by mouth.   nystatin (MYCOSTATIN/NYSTOP) powder Apply 1 application. topically 3 (three) times daily. Apply to affected area for up to 7 days   pseudoephedrine-guaifenesin (TUSSIN PE) 30-100 MG/5ML SYRP Take 5 mLs by mouth every 4 (four) hours as needed.   traZODone (DESYREL) 100 MG tablet  Take 1 tablet by mouth at bedtime as needed.   [DISCONTINUED] losartan (COZAAR) 100 MG tablet Take 1 tablet (100 mg total) by mouth daily.   No facility-administered medications prior to visit.    Review of Systems  All other systems reviewed and are negative.         Objective:     BP (!) 144/90 (BP Location: Right Arm, Patient Position: Sitting, Cuff Size: Large)   Pulse 90   Temp 97.6 F (36.4 C) (Oral)   Ht 5' 7.5" (1.715 m)   Wt 246 lb 3.2 oz (111.7 kg)   LMP 10/11/2019 (Approximate)   SpO2 100%   BMI  37.99 kg/m    Physical Exam Vitals reviewed.  Constitutional:      Appearance: Normal appearance. She is well-groomed. She is obese.  HENT:     Right Ear: Tympanic membrane normal.     Left Ear: Tympanic membrane normal.     Mouth/Throat:     Mouth: Mucous membranes are moist.     Pharynx: No posterior oropharyngeal erythema.  Eyes:     Conjunctiva/sclera: Conjunctivae normal.  Neck:     Thyroid: No thyromegaly.  Cardiovascular:     Rate and Rhythm: Normal rate and regular rhythm.     Pulses: Normal pulses.     Heart sounds: S1 normal and S2 normal.  Pulmonary:     Effort: Pulmonary effort is normal.     Breath sounds: Normal air entry. Wheezing (diffuse inspriatory and expiratory wheezing in the posterior lung fields) present.  Abdominal:     General: Bowel sounds are normal.  Musculoskeletal:     Right lower leg: No edema.     Left lower leg: No edema.  Neurological:     Mental Status: She is alert and oriented to person, place, and time. Mental status is at baseline.     Gait: Gait is intact.  Psychiatric:        Mood and Affect: Mood and affect normal.        Speech: Speech normal.        Behavior: Behavior normal.        Judgment: Judgment normal.     Diabetic Foot Exam - Simple   Simple Foot Form Diabetic Foot exam was performed with the following findings: Yes 09/27/2022  2:49 PM  Visual Inspection No deformities, no ulcerations, no other skin breakdown bilaterally: Yes Sensation Testing Intact to touch and monofilament testing bilaterally: Yes Pulse Check Posterior Tibialis and Dorsalis pulse intact bilaterally: Yes Comments Pt feels pressure but not the point touch of the monofilament     No results found for any visits on 09/27/22.     Assessment & Plan:    Routine Health Maintenance and Physical Exam  Immunization History  Administered Date(s) Administered   Influenza Split 05/26/2012   Influenza,inj,Quad PF,6+ Mos 04/09/2013, 04/12/2015,  05/27/2018, 06/12/2022   Influenza-Unspecified 05/09/2016, 05/13/2017   PFIZER(Purple Top)SARS-COV-2 Vaccination 11/05/2019, 11/26/2019   Pneumococcal Polysaccharide-23 04/27/2013   Tdap 04/09/2013, 06/24/2022    Health Maintenance  Topic Date Due   Zoster Vaccines- Shingrix (1 of 2) Never done   Diabetic kidney evaluation - eGFR measurement  09/24/2022   COVID-19 Vaccine (3 - 2023-24 season) 10/13/2022 (Originally 04/12/2022)   OPHTHALMOLOGY EXAM  12/26/2022 (Originally 08/12/2022)   HEMOGLOBIN A1C  12/11/2022   Diabetic kidney evaluation - Urine ACR  06/13/2023   FOOT EXAM  09/28/2023   PAP SMEAR-Modifier  03/20/2024   MAMMOGRAM  07/20/2024  COLONOSCOPY (Pts 45-21yr Insurance coverage will need to be confirmed)  05/24/2031   DTaP/Tdap/Td (3 - Td or Tdap) 06/24/2032   INFLUENZA VACCINE  Completed   Hepatitis C Screening  Completed   HIV Screening  Completed   HPV VACCINES  Aged Out    Discussed health benefits of physical activity, and encouraged her to engage in regular exercise appropriate for her age and condition.  Problem List Items Addressed This Visit       Unprioritized   Hypertension   Relevant Medications   losartan (COZAAR) 100 MG tablet   Other Relevant Orders   CMP   Lipid Panel   Other Visit Diagnoses     Encounter for routine adult physical exam with abnormal findings    -  Primary  Wheezing on exam today, otherwise physical exam is WNL. Her BP is slightly elevated today but usually it is well controlled. Will recheck at next visit. Handouts given on exercise and diet. Ordering CMP and lipid panel   Acute bronchitis, unspecified organism       Relevant Medications   Acute wheezing heard on exam today. Will treat with prednisone 20 mg tablets, starting with 60 mg daily and decreasing in a taper until gone. Will give PRN albuterol 2 puffs every 4-6 hours as needed for cough and wheeze.  predniSONE (DELTASONE) 20 MG tablet   albuterol (VENTOLIN HFA) 108 (90  Base) MCG/ACT inhaler      Return in about 6 months (around 03/28/2023) for DM, HTN.     BFarrel Conners MD

## 2022-10-01 ENCOUNTER — Other Ambulatory Visit (INDEPENDENT_AMBULATORY_CARE_PROVIDER_SITE_OTHER): Payer: BC Managed Care – PPO

## 2022-10-01 DIAGNOSIS — I10 Essential (primary) hypertension: Secondary | ICD-10-CM

## 2022-10-01 LAB — COMPREHENSIVE METABOLIC PANEL
ALT: 23 U/L (ref 0–35)
AST: 23 U/L (ref 0–37)
Albumin: 4.2 g/dL (ref 3.5–5.2)
Alkaline Phosphatase: 107 U/L (ref 39–117)
BUN: 16 mg/dL (ref 6–23)
CO2: 26 mEq/L (ref 19–32)
Calcium: 9.5 mg/dL (ref 8.4–10.5)
Chloride: 103 mEq/L (ref 96–112)
Creatinine, Ser: 0.84 mg/dL (ref 0.40–1.20)
GFR: 80.05 mL/min (ref >60.00)
Glucose, Bld: 116 mg/dL — ABNORMAL HIGH (ref 70–99)
Potassium: 3.9 mEq/L (ref 3.5–5.1)
Sodium: 139 mEq/L (ref 135–145)
Total Bilirubin: 0.5 mg/dL (ref 0.2–1.2)
Total Protein: 7.7 g/dL (ref 6.0–8.3)

## 2022-10-01 LAB — LIPID PANEL
Cholesterol: 215 mg/dL — ABNORMAL HIGH (ref 0–200)
HDL: 92 mg/dL (ref >39.00)
LDL Cholesterol: 97 mg/dL (ref 0–99)
NonHDL: 123.44
Total CHOL/HDL Ratio: 2
Triglycerides: 132 mg/dL (ref 0.0–149.0)
VLDL: 26.4 mg/dL (ref 0.0–40.0)

## 2022-11-13 ENCOUNTER — Encounter: Payer: Self-pay | Admitting: Family Medicine

## 2022-11-13 DIAGNOSIS — R053 Chronic cough: Secondary | ICD-10-CM

## 2022-11-15 ENCOUNTER — Ambulatory Visit (INDEPENDENT_AMBULATORY_CARE_PROVIDER_SITE_OTHER): Payer: BC Managed Care – PPO

## 2022-11-15 ENCOUNTER — Other Ambulatory Visit: Payer: BC Managed Care – PPO

## 2022-11-15 DIAGNOSIS — R079 Chest pain, unspecified: Secondary | ICD-10-CM | POA: Diagnosis not present

## 2022-11-15 DIAGNOSIS — R053 Chronic cough: Secondary | ICD-10-CM | POA: Diagnosis not present

## 2022-11-15 DIAGNOSIS — R059 Cough, unspecified: Secondary | ICD-10-CM | POA: Diagnosis not present

## 2022-11-17 ENCOUNTER — Encounter: Payer: Self-pay | Admitting: Family Medicine

## 2023-01-01 ENCOUNTER — Ambulatory Visit: Payer: BC Managed Care – PPO | Admitting: Obstetrics and Gynecology

## 2023-01-01 ENCOUNTER — Encounter: Payer: Self-pay | Admitting: Obstetrics and Gynecology

## 2023-01-01 VITALS — BP 118/86 | HR 71 | Ht 68.0 in | Wt 244.0 lb

## 2023-01-01 DIAGNOSIS — N898 Other specified noninflammatory disorders of vagina: Secondary | ICD-10-CM

## 2023-01-01 DIAGNOSIS — N905 Atrophy of vulva: Secondary | ICD-10-CM | POA: Diagnosis not present

## 2023-01-01 LAB — WET PREP FOR TRICH, YEAST, CLUE

## 2023-01-01 NOTE — Progress Notes (Unsigned)
GYNECOLOGY  VISIT   HPI: 52 y.o.   Married  Caucasian  female   [phone] with Patient's last menstrual period was 10/11/2019 (approximate).   here for  postmenopausal issues.  Having vaginal irritation and atrophy.  Using KY jelly and Vagisil.  Vagisil burns.   No vaginal discharge or odor.   No vaginal bleeding.   A1C 6.3 06/12/22.  GYNECOLOGIC HISTORY: Patient's last menstrual period was 10/11/2019 (approximate). Contraception:  PMP Menopausal hormone therapy:  n/a Last mammogram:  06/19/22 R breast U/S BI-RADS CAT 1 neg, 11/14/21 Breast density Cat B, BI-RADS CAT 1 neg Last pap smear:   03/20/21 ASCUS: HR HPV neg, 09/19/17 neg: HR HPV neg        OB History     Gravida  3   Para  1   Term  1   Preterm      AB  2   Living  1      SAB  2   IAB      Ectopic      Multiple      Live Births                 Patient Active Problem List   Diagnosis Date Noted   Type 2 diabetes mellitus with diabetic polyneuropathy, without long-term current use of insulin (HCC) 12/03/2021   Hypertension 12/03/2021   B12 deficiency 12/03/2021   Primary osteoarthritis of right knee 05/26/2018   Bilateral nipple discharge 09/19/2017   Primary localized osteoarthritis of left knee 07/22/2017   Primary osteoarthritis of left knee 07/22/2017   IDA (iron deficiency anemia) 05/20/2017   Effusion of left knee 05/17/2017   Degenerative arthritis of right knee 02/13/2017   Degenerative arthritis of left knee 02/13/2017   Numbness and tingling of both lower extremities 10/18/2016   Genetic testing 10/18/2016   Family history of breast cancer    Multinodular goiter 04/24/2015   Mood disorder (HCC) 04/12/2015   Peroneal mononeuropathy 08/22/2014   Left foot drop 07/21/2014   Menorrhagia with irregular cycle 04/29/2014   S/P gastric bypass 11/02/2013   Hyperlipidemia 10/08/2013   Primary localized osteoarthrosis, lower leg 07/06/2013   Obesity, BMI unknown 04/23/2013   IUD  contraception - inserted summer 2013 04/09/2013    Past Medical History:  Diagnosis Date   Allergy    Anxiety and depression    Arthritis    Clotting disorder (HCC)    Complication of anesthesia    has been hard to wake up post-op   Dental crowns present    Depression    Diabetes (HCC)    resolved with gastric bypass, no meds   DVT of lower extremity (deep venous thrombosis) (HCC) 2012   LLE   Dysmenorrhea    hx endometriosis and fibroid   Elevated prolactin level 2021   Family history of breast cancer    Headache(784.0)    tension or sinus   History of MRSA infection prior to 2012   lasted 5-6 yr.   Hx of nipple discharge    Bilateral.  Long standing.  Negative work up.    Hypertension    Jaw snapping    states jaw pops   Left foot drop 2016   --[place] [facility]   Neuromuscular disorder (HCC)    neuropathy in hands,feet,face   Neuropathy 2015   hands, feet and face-- neurology   Prothrombin gene mutation (HCC) 2018   single gene mutation   Right knee pain  hx meniscal injury, chondromalacia, OA   Thyroid nodule     Past Surgical History:  Procedure Laterality Date   BREAST BIOPSY Left 01/06/2020   BREAST BIOPSY Right 01/17/2020   CHOLECYSTECTOMY  1990's   DILITATION & CURRETTAGE/HYSTROSCOPY WITH NOVASURE ABLATION N/A 05/30/2014   Procedure: DILATATION & CURETTAGE/HYSTEROSCOPY WITH attempted NOVASURE ABLATION and with IUD removal ;  Surgeon: Jacqualin Combes de Gwenevere Ghazi, MD;  Location: [facility];  Service: Gynecology;  Laterality: N/A;   GASTRIC ROUX-EN-Y N/A 11/02/2013   Procedure: LAPAROSCOPIC ROUX-EN-Y GASTRIC BYPASS WITH UPPER ENDOSCOPY;  Surgeon: Atilano Ina, MD;  Location: [facility];  Service: General;  Laterality: N/A;   HAND SURGERY Left    X 3 - spider bite and MRSA infection   HYSTEROSCOPY WITH RESECTOSCOPE N/A 05/30/2014   Procedure: HYSTEROSCOPY WITH RESECTOSCOPE;  Surgeon: Jacqualin Combes de Gwenevere Ghazi, MD;  Location: [facility];   Service: Gynecology;  Laterality: N/A;   KNEE ARTHROSCOPY Right 12/29/2012   Procedure: RIGHT KNEE ARTHROSCOPY  PARTIAL MEDIAL AND LATERAL MENISECTOMY WITH CHONDROPLASTY;  Surgeon: Velna Ochs, MD;  Location:  [facility];  Service: Orthopedics;  Laterality: Right;   PELVIC LAPAROSCOPY  1990's   d/t endometriosis   TONSILLECTOMY  as child   TOTAL KNEE ARTHROPLASTY Left 07/22/2017   Procedure: TOTAL KNEE ARTHROPLASTY;  Surgeon: Marcene Corning, MD;  Location: [facility];  Service: Orthopedics;  Laterality: Left;   TOTAL KNEE ARTHROPLASTY Right 05/26/2018   Procedure: RIGHT TOTAL KNEE ARTHROPLASTY;  Surgeon: Marcene Corning, MD;  Location: [facility];  Service: Orthopedics;  Laterality: Right;   WRIST SURGERY Right 2005    Current Outpatient Medications  Medication Sig Dispense Refill   Acetaminophen (TYLENOL PO) Take 1 capsule by mouth every 8 (eight) hours.     albuterol (VENTOLIN HFA) 108 (90 Base) MCG/ACT inhaler Inhale 2 puffs into the lungs every 6 (six) hours as needed for wheezing or shortness of breath. 8 g 0   Cyanocobalamin (B-12) 1000 MCG SUBL Place 1,000 mcg under the tongue daily. 90 tablet 1   DULoxetine (CYMBALTA) 60 MG capsule Take 120 mg by mouth every morning.      empagliflozin (JARDIANCE) 10 MG TABS tablet Take 1 tablet (10 mg total) by mouth daily before breakfast. 90 tablet 3   folic acid (FOLVITE) 1 MG tablet Take 1 tablet (1 mg total) by mouth daily. 90 tablet 1   losartan (COZAAR) 100 MG tablet Take 1 tablet (100 mg total) by mouth daily. 90 tablet 3   Multiple Vitamin (MULTIVITAMIN ADULT PO) Take by mouth.     nystatin (MYCOSTATIN/NYSTOP) powder Apply 1 application. topically 3 (three) times daily. Apply to affected area for up to 7 days 30 g 3   pseudoephedrine-guaifenesin (TUSSIN PE) 30-100 MG/5ML SYRP Take 5 mLs by mouth every 4 (four) hours as needed. 240 mL 0   traZODone (DESYREL) 100 MG tablet Take 1 tablet by mouth at bedtime as needed.     No  current facility-administered medications for this visit.     ALLERGIES: Bactrim [sulfamethoxazole-trimethoprim], Ibuprofen, Lamotrigine, and Sulfa antibiotics  Family History  Problem Relation Age of Onset   Diabetes Mother        Living   Kidney disease Mother    Hypertension Mother    Breast cancer Mother 5       dx'd with breast ca x2; 2nd around 17; 3rd 60's   Diabetes Maternal Grandmother 61       dec  Breast cancer Maternal Grandmother        dx in her 73s   Arthritis Father    Healthy Sister    Aneurysm Sister 48       brain   High blood pressure Sister    Diabetes Maternal Grandfather    Stroke Maternal Grandfather    Other Paternal Grandmother        died in childbirth   Other Paternal Grandfather        unknown   Healthy Daughter    Breast cancer Other        2 great aunts   Thyroid disease Neg Hx     Social History   Socioeconomic History   Marital status: Married    Spouse name: Not on file   Number of children: Not on file   Years of education: Not on file   Highest education level: Not on file  Occupational History   Not on file  Tobacco Use   Smoking status: Never   Smokeless tobacco: Never  Vaping Use   Vaping Use: Never used  Substance and Sexual Activity   Alcohol use: Yes    Alcohol/week: 4.0 - 6.0 standard drinks of alcohol    Types: 4 - 6 Glasses of wine per week   Drug use: No   Sexual activity: Yes    Partners: Female    Comment: female partner  Other Topics Concern   Not on file  Social History Narrative   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   PHYSICAL EXAMINATION:    BP 118/86 (BP Location: Right Arm, Patient Position: Sitting, Cuff Size: Large)   Pulse 71   Ht 5\' 8"  (1.727 m)   Wt 244 lb (110.7 kg)   LMP 10/11/2019 (Approximate)   SpO2 97%   BMI 37.10 kg/m     General appearance: alert, cooperative and appears stated age   Pelvic: External genitalia:  erythema of the labia bilaterally.               Urethra:  normal appearing urethra with no masses, tenderness or lesions              Bartholins and Skenes: normal                 Vagina: normal appearing vagina with normal color and yellow discharge, no lesions              Cervix: no lesions                Bimanual Exam:  Uterus:  normal size, contour, position, consistency, mobility, non-tender              Adnexa: no mass, fullness, tenderness      Chaperone was present for  exam:  Warren Lacy, CMA  ASSESSMENT  Vaginal irritation. Postmenopausal female.   Hx DVT on birth control. Prothrombin gene mutation.   Increased lifetime risk of breast CA.  PLAN  Wet prep:  negative clue cells, negative yeast, negative trichomonas.  We reviewed postmenopausal atrophy and treatment options:  water based lubricants, vitamin E, cooking oils.   She understands that vitamin E vaginal suppositories can be compounded.  Will avoid hormonal options and those that can increase risk of cardiovascular events like Osphena.  20 min  total time was spent for this patient encounter, including preparation, face-to-face counseling with the patient, coordination of care, and documentation of the encounter.

## 2023-01-31 ENCOUNTER — Ambulatory Visit: Payer: BC Managed Care – PPO | Admitting: Family Medicine

## 2023-01-31 ENCOUNTER — Encounter: Payer: Self-pay | Admitting: Family Medicine

## 2023-01-31 VITALS — BP 154/76 | HR 96 | Temp 98.2°F | Ht 68.0 in | Wt 245.5 lb

## 2023-01-31 DIAGNOSIS — Z7984 Long term (current) use of oral hypoglycemic drugs: Secondary | ICD-10-CM | POA: Diagnosis not present

## 2023-01-31 DIAGNOSIS — E1142 Type 2 diabetes mellitus with diabetic polyneuropathy: Secondary | ICD-10-CM

## 2023-01-31 LAB — POCT GLYCOSYLATED HEMOGLOBIN (HGB A1C): Hemoglobin A1C: 6.6 % — AB (ref 4.0–5.6)

## 2023-01-31 MED ORDER — GABAPENTIN 300 MG PO CAPS: 300.0000 mg | ORAL_CAPSULE | Freq: Three times a day (TID) | ORAL | 2 refills | Status: DC

## 2023-01-31 NOTE — Progress Notes (Unsigned)
Established Patient Office Visit  Subjective   Patient ID: [name], female    DOB: Mar 04, 1971  Age: 52 y.o. MRN: [id number]  Chief Complaint  Patient presents with   Medical Management of Chronic Issues    Pt is here to discuss on her neuropathy. Also on BP med- Losartan. Pt reports she pick up new refill with different bottle. Doesn't seem to work.     Pt is here for follow up on her neuropathy symptoms. She reports that the symptoms remain and she has tried other medications in the past, just didn't really feel any improvement. I extensively reviewed her past medications, which ones she has been tried on, she was briefly on gabapentin 100 mg and also has tried lyrica in the past. We had a long discussion about the medications and the fact that the dose maybe wasn't high enough to help with her symptoms, she is agreeable to try the gabapentin again at a higher dose.     Current Outpatient Medications  Medication Instructions   Acetaminophen (TYLENOL PO) 1 capsule, Oral, Every 8 hours   B-12 1,000 mcg, Sublingual, Daily   empagliflozin (JARDIANCE) 10 mg, Oral, Daily before breakfast   gabapentin (NEURONTIN) 300 mg, Oral, 3 times daily   losartan (COZAAR) 100 mg, Oral, Daily   Multiple Vitamin (MULTIVITAMIN ADULT PO) Oral   nystatin (MYCOSTATIN/NYSTOP) powder 1 application , Topical, 3 times daily, Apply to affected area for up to 7 days   traZODone (DESYREL) 100 MG tablet 1 tablet, Oral, At bedtime PRN    Patient Active Problem List   Diagnosis Date Noted   Type 2 diabetes mellitus with diabetic polyneuropathy, without long-term current use of insulin (HCC) 12/03/2021   Hypertension 12/03/2021   B12 deficiency 12/03/2021   Primary osteoarthritis of right knee 05/26/2018   Bilateral nipple discharge 09/19/2017   Primary localized osteoarthritis of left knee 07/22/2017   Primary osteoarthritis of left knee 07/22/2017   IDA (iron deficiency anemia) 05/20/2017   Effusion  of left knee 05/17/2017   Degenerative arthritis of right knee 02/13/2017   Degenerative arthritis of left knee 02/13/2017   Numbness and tingling of both lower extremities 10/18/2016   Genetic testing 10/18/2016   Family history of breast cancer    Multinodular goiter 04/24/2015   Mood disorder (HCC) 04/12/2015   Peroneal mononeuropathy 08/22/2014   Left foot drop 07/21/2014   Menorrhagia with irregular cycle 04/29/2014   S/P gastric bypass 11/02/2013   Hyperlipidemia 10/08/2013   Primary localized osteoarthrosis, lower leg 07/06/2013   Obesity, BMI unknown 04/23/2013   IUD contraception - inserted summer 2013 04/09/2013      Review of Systems  All other systems reviewed and are negative.     Objective:     BP (!) 154/76 (BP Location: Right Arm, Patient Position: Sitting, Cuff Size: Large)   Pulse 96   Temp 98.2 F (36.8 C) (Oral)   Ht 5\' 8"  (1.727 m)   Wt 245 lb 8 oz (111.4 kg)   LMP 10/11/2019 (Approximate)   SpO2 96%   BMI 37.33 kg/m    Physical Exam Vitals reviewed.  Constitutional:      Appearance: Normal appearance. She is well-groomed. She is obese.  Cardiovascular:     Pulses:          Dorsalis pedis pulses are 2+ on the right side and 2+ on the left side.       Posterior tibial pulses are 2+ on the right side  and 2+ on the left side.  Pulmonary:     Effort: Pulmonary effort is normal.     Breath sounds: Normal air entry.  Musculoskeletal:     Right lower leg: No edema.     Left lower leg: No edema.  Neurological:     Mental Status: She is alert and oriented to person, place, and time. Mental status is at baseline.     Gait: Gait is intact.  Psychiatric:        Mood and Affect: Mood and affect normal.        Speech: Speech normal.        Behavior: Behavior normal.        Judgment: Judgment normal.      Results for orders placed or performed in visit on 01/31/23  POC HgB A1c  Result Value Ref Range   Hemoglobin A1C 6.6 (A) 4.0 - 5.6 %    HbA1c POC (<> result, manual entry)     HbA1c, POC (prediabetic range)     HbA1c, POC (controlled diabetic range)        The 10-year ASCVD risk score (Arnett DK, et al., 2019) is: 3.4%    Assessment & Plan:  Type 2 diabetes mellitus with diabetic polyneuropathy, without long-term current use of insulin (HCC) Assessment & Plan: A1C remains controlled today, will start gabapentin and titrate up to TID dosing, instructions were written for patient. Will see her back in 3 months for a video visit to follow up on her symptoms and how the medication is working for her.   Orders: -     POCT glycosylated hemoglobin (Hb A1C) -     Gabapentin; Take 1 capsule (300 mg total) by mouth 3 (three) times daily.  Dispense: 90 capsule; Refill: 2     Return in about 3 months (around 05/03/2023) for DM neuropathy -- ok to be a video visit.Karie Georges, MD

## 2023-01-31 NOTE — Patient Instructions (Signed)
Start with 300 mg gabapentin at bedtime x 7 days  Gabapentin 300 mg twice a day x 7 days  300 three times a day

## 2023-02-03 ENCOUNTER — Ambulatory Visit: Payer: BC Managed Care – PPO | Admitting: Family Medicine

## 2023-02-03 ENCOUNTER — Encounter: Payer: Self-pay | Admitting: Family Medicine

## 2023-02-03 NOTE — Assessment & Plan Note (Signed)
A1C remains controlled today, will start gabapentin and titrate up to TID dosing, instructions were written for patient. Will see her back in 3 months for a video visit to follow up on her symptoms and how the medication is working for her.

## 2023-02-17 ENCOUNTER — Encounter: Payer: Self-pay | Admitting: Family Medicine

## 2023-02-17 DIAGNOSIS — R200 Anesthesia of skin: Secondary | ICD-10-CM

## 2023-02-25 MED ORDER — PREGABALIN 50 MG PO CAPS: ORAL_CAPSULE | ORAL | 0 refills | Status: DC

## 2023-02-27 DIAGNOSIS — F5101 Primary insomnia: Secondary | ICD-10-CM | POA: Diagnosis not present

## 2023-02-27 DIAGNOSIS — F3342 Major depressive disorder, recurrent, in full remission: Secondary | ICD-10-CM | POA: Diagnosis not present

## 2023-03-12 ENCOUNTER — Encounter (INDEPENDENT_AMBULATORY_CARE_PROVIDER_SITE_OTHER): Payer: Self-pay

## 2023-03-27 ENCOUNTER — Encounter (INDEPENDENT_AMBULATORY_CARE_PROVIDER_SITE_OTHER): Payer: Self-pay

## 2023-03-28 ENCOUNTER — Ambulatory Visit: Payer: BC Managed Care – PPO | Admitting: Family Medicine

## 2023-04-16 ENCOUNTER — Other Ambulatory Visit: Payer: Self-pay | Admitting: Obstetrics and Gynecology

## 2023-04-16 DIAGNOSIS — Z1231 Encounter for screening mammogram for malignant neoplasm of breast: Secondary | ICD-10-CM

## 2023-04-22 ENCOUNTER — Ambulatory Visit (INDEPENDENT_AMBULATORY_CARE_PROVIDER_SITE_OTHER): Payer: BC Managed Care – PPO

## 2023-04-22 ENCOUNTER — Ambulatory Visit: Payer: BC Managed Care – PPO | Admitting: Family Medicine

## 2023-04-22 ENCOUNTER — Encounter: Payer: Self-pay | Admitting: Family Medicine

## 2023-04-22 VITALS — BP 130/80 | HR 81 | Temp 98.1°F | Ht 68.0 in | Wt 246.3 lb

## 2023-04-22 DIAGNOSIS — R109 Unspecified abdominal pain: Secondary | ICD-10-CM

## 2023-04-22 DIAGNOSIS — K5900 Constipation, unspecified: Secondary | ICD-10-CM | POA: Diagnosis not present

## 2023-04-22 LAB — CBC WITH DIFFERENTIAL/PLATELET
Basophils Absolute: 0.1 10*3/uL (ref 0.0–0.1)
Basophils Relative: 1.2 % (ref 0.0–3.0)
Eosinophils Absolute: 0.3 10*3/uL (ref 0.0–0.7)
Eosinophils Relative: 3.4 % (ref 0.0–5.0)
HCT: 43.3 % (ref 36.0–46.0)
Hemoglobin: 14.2 g/dL (ref 12.0–15.0)
Lymphocytes Relative: 29.5 % (ref 12.0–46.0)
Lymphs Abs: 2.2 10*3/uL (ref 0.7–4.0)
MCHC: 32.7 g/dL (ref 30.0–36.0)
MCV: 94.1 fl (ref 78.0–100.0)
Monocytes Absolute: 0.5 10*3/uL (ref 0.1–1.0)
Monocytes Relative: 7.5 % (ref 3.0–12.0)
Neutro Abs: 4.3 10*3/uL (ref 1.4–7.7)
Neutrophils Relative %: 58.4 % (ref 43.0–77.0)
Platelets: 348 10*3/uL (ref 150.0–400.0)
RBC: 4.6 Mil/uL (ref 3.87–5.11)
RDW: 13.6 % (ref 11.5–15.5)
WBC: 7.3 10*3/uL (ref 4.0–10.5)

## 2023-04-22 LAB — POCT URINALYSIS DIPSTICK
Bilirubin, UA: NEGATIVE
Blood, UA: NEGATIVE
Glucose, UA: POSITIVE — AB
Ketones, UA: NEGATIVE
Leukocytes, UA: NEGATIVE
Nitrite, UA: NEGATIVE
Protein, UA: NEGATIVE
Spec Grav, UA: 1.02 (ref 1.010–1.025)
Urobilinogen, UA: 0.2 U/dL
pH, UA: 5.5 (ref 5.0–8.0)

## 2023-04-22 LAB — COMPREHENSIVE METABOLIC PANEL
ALT: 21 U/L (ref 0–35)
AST: 23 U/L (ref 0–37)
Albumin: 4.2 g/dL (ref 3.5–5.2)
Alkaline Phosphatase: 113 U/L (ref 39–117)
BUN: 12 mg/dL (ref 6–23)
CO2: 30 meq/L (ref 19–32)
Calcium: 9.3 mg/dL (ref 8.4–10.5)
Chloride: 103 meq/L (ref 96–112)
Creatinine, Ser: 0.82 mg/dL (ref 0.40–1.20)
GFR: 82.08 mL/min (ref >60.00)
Glucose, Bld: 101 mg/dL — ABNORMAL HIGH (ref 70–99)
Potassium: 4.2 meq/L (ref 3.5–5.1)
Sodium: 139 meq/L (ref 135–145)
Total Bilirubin: 0.4 mg/dL (ref 0.2–1.2)
Total Protein: 7.5 g/dL (ref 6.0–8.3)

## 2023-04-22 NOTE — Progress Notes (Signed)
Established Patient Office Visit  Subjective   Patient ID: [name], female    DOB: 22-Apr-1971  Age: 52 y.o. MRN: [id number]  Chief Complaint  Patient presents with   Abdominal Pain    Patient complains of right-sided abdominal pain x1 week, states she has noticed increased bowel movements with smaller amounts, mild nausea    Pt is reporting right sided flank pain for about a week.  Describes it as a sharp stabbing pain, is getting more frequent, also reports tenderness to the right of her belly button around to her back. Thought perhaps she was feverish a few days ago, has had some BM's, more frequent but smaller BM's, states that this is not normal for her. Some mild nausea, no vomiting, no problems urinating, no blood in urine that she can see.   Abdominal Pain This is a new problem. The current episode started in the past 7 days. The onset quality is sudden. The problem occurs constantly. Associated symptoms include nausea. Pertinent negatives include no dysuria, hematuria, vomiting or weight loss.    Current Outpatient Medications  Medication Instructions   Acetaminophen (TYLENOL PO) 1 capsule, Oral, Every 8 hours   B-12 1,000 mcg, Sublingual, Daily   empagliflozin (JARDIANCE) 10 mg, Oral, Daily before breakfast   losartan (COZAAR) 100 mg, Oral, Daily   Multiple Vitamin (MULTIVITAMIN ADULT PO) Oral   nystatin (MYCOSTATIN/NYSTOP) powder 1 application , Topical, 3 times daily, Apply to affected area for up to 7 days   traZODone (DESYREL) 100 MG tablet 1 tablet, Oral, At bedtime PRN    Patient Active Problem List   Diagnosis Date Noted   Type 2 diabetes mellitus with diabetic polyneuropathy, without long-term current use of insulin (HCC) 12/03/2021   Hypertension 12/03/2021   B12 deficiency 12/03/2021   Primary osteoarthritis of right knee 05/26/2018   Bilateral nipple discharge 09/19/2017   Primary localized osteoarthritis of left knee 07/22/2017   Primary  osteoarthritis of left knee 07/22/2017   IDA (iron deficiency anemia) 05/20/2017   Effusion of left knee 05/17/2017   Degenerative arthritis of right knee 02/13/2017   Degenerative arthritis of left knee 02/13/2017   Numbness and tingling of both lower extremities 10/18/2016   Genetic testing 10/18/2016   Family history of breast cancer    Multinodular goiter 04/24/2015   Mood disorder (HCC) 04/12/2015   Peroneal mononeuropathy 08/22/2014   Left foot drop 07/21/2014   Menorrhagia with irregular cycle 04/29/2014   S/P gastric bypass 11/02/2013   Hyperlipidemia 10/08/2013   Primary localized osteoarthrosis, lower leg 07/06/2013   Obesity, BMI unknown 04/23/2013   IUD contraception - inserted summer 2013 04/09/2013      Review of Systems  Constitutional:  Negative for malaise/fatigue and weight loss.  Gastrointestinal:  Positive for abdominal pain and nausea. Negative for blood in stool and vomiting.  Genitourinary:  Positive for flank pain. Negative for dysuria, hematuria and urgency.  All other systems reviewed and are negative.     Objective:     BP 130/80 (BP Location: Left Arm, Patient Position: Sitting, Cuff Size: Large)   Pulse 81   Temp 98.1 F (36.7 C) (Oral)   Ht 5\' 8"  (1.727 m)   Wt 246 lb 4.8 oz (111.7 kg)   LMP 10/11/2019 (Approximate)   SpO2 98%   BMI 37.45 kg/m    Physical Exam Constitutional:      Appearance: She is well-developed. She is obese.  Cardiovascular:     Rate and Rhythm: Normal  rate and regular rhythm.     Heart sounds: Normal heart sounds. No murmur heard. Pulmonary:     Effort: Pulmonary effort is normal.     Breath sounds: Normal breath sounds. No wheezing.  Abdominal:     General: Abdomen is flat. Bowel sounds are normal.     Palpations: Abdomen is soft.     Tenderness: There is abdominal tenderness in the right upper quadrant. There is guarding. There is no right CVA tenderness or left CVA tenderness. Negative signs include  Murphy's sign.  Neurological:     Mental Status: She is alert and oriented to person, place, and time.      Results for orders placed or performed in visit on 04/22/23  POC Urinalysis Dipstick  Result Value Ref Range   Color, UA yellow    Clarity, UA clear    Glucose, UA Positive (A) Negative   Bilirubin, UA negative    Ketones, UA negative    Spec Grav, UA 1.020 1.010 - 1.025   Blood, UA negative    pH, UA 5.5 5.0 - 8.0   Protein, UA Negative Negative   Urobilinogen, UA 0.2 0.2 or 1.0 E.U./dL   Nitrite, UA negative    Leukocytes, UA Negative Negative   Appearance     Odor        The 10-year ASCVD risk score (Arnett DK, et al., 2019) is: 2.4%    Assessment & Plan:  Right flank pain -     POCT urinalysis dipstick -     Comprehensive metabolic panel -     CBC with Differential/Platelet -     DG Abd 2 Views; Future  UA is unrevealing, negative for infection and blood. Will order CBC/CMP to check for signs of infection, liver issues, also checking abdominal film in office today to rule out changes in bowel gas pattern. If needed will order additional testing to rule out other causes.   No follow-ups on file.    Karie Georges, MD

## 2023-04-23 ENCOUNTER — Encounter: Payer: Self-pay | Admitting: Family Medicine

## 2023-05-01 ENCOUNTER — Ambulatory Visit: Payer: BC Managed Care – PPO | Admitting: Family Medicine

## 2023-05-02 ENCOUNTER — Ambulatory Visit
Admission: RE | Admit: 2023-05-02 | Discharge: 2023-05-02 | Disposition: A | Discharge status: Home / Self Care | Payer: BC Managed Care – PPO | Source: Ambulatory Visit | Attending: Obstetrics and Gynecology | Admitting: Obstetrics and Gynecology

## 2023-05-02 DIAGNOSIS — Z1231 Encounter for screening mammogram for malignant neoplasm of breast: Secondary | ICD-10-CM | POA: Diagnosis not present

## 2023-05-19 LAB — HM DIABETES EYE EXAM

## 2023-05-27 DIAGNOSIS — F331 Major depressive disorder, recurrent, moderate: Secondary | ICD-10-CM | POA: Diagnosis not present

## 2023-06-02 DIAGNOSIS — M79671 Pain in right foot: Secondary | ICD-10-CM | POA: Diagnosis not present

## 2023-06-02 DIAGNOSIS — M79672 Pain in left foot: Secondary | ICD-10-CM | POA: Diagnosis not present

## 2023-06-10 DIAGNOSIS — F331 Major depressive disorder, recurrent, moderate: Secondary | ICD-10-CM | POA: Diagnosis not present

## 2023-06-17 DIAGNOSIS — F331 Major depressive disorder, recurrent, moderate: Secondary | ICD-10-CM | POA: Diagnosis not present

## 2023-06-19 DIAGNOSIS — G8918 Other acute postprocedural pain: Secondary | ICD-10-CM | POA: Diagnosis not present

## 2023-06-19 DIAGNOSIS — M2012 Hallux valgus (acquired), left foot: Secondary | ICD-10-CM | POA: Diagnosis not present

## 2023-06-24 DIAGNOSIS — F331 Major depressive disorder, recurrent, moderate: Secondary | ICD-10-CM | POA: Diagnosis not present

## 2023-07-08 ENCOUNTER — Other Ambulatory Visit: Payer: Self-pay | Admitting: Family Medicine

## 2023-07-08 DIAGNOSIS — F331 Major depressive disorder, recurrent, moderate: Secondary | ICD-10-CM | POA: Diagnosis not present

## 2023-07-08 DIAGNOSIS — E1142 Type 2 diabetes mellitus with diabetic polyneuropathy: Secondary | ICD-10-CM

## 2023-07-13 ENCOUNTER — Other Ambulatory Visit: Payer: Self-pay | Admitting: Family Medicine

## 2023-07-13 DIAGNOSIS — I10 Essential (primary) hypertension: Secondary | ICD-10-CM

## 2023-07-14 ENCOUNTER — Other Ambulatory Visit: Payer: Self-pay | Admitting: Family Medicine

## 2023-07-14 DIAGNOSIS — M79672 Pain in left foot: Secondary | ICD-10-CM | POA: Diagnosis not present

## 2023-07-14 DIAGNOSIS — E1142 Type 2 diabetes mellitus with diabetic polyneuropathy: Secondary | ICD-10-CM

## 2023-07-14 DIAGNOSIS — M21611 Bunion of right foot: Secondary | ICD-10-CM | POA: Diagnosis not present

## 2023-07-15 DIAGNOSIS — F331 Major depressive disorder, recurrent, moderate: Secondary | ICD-10-CM | POA: Diagnosis not present

## 2023-07-30 DIAGNOSIS — M2011 Hallux valgus (acquired), right foot: Secondary | ICD-10-CM | POA: Diagnosis not present

## 2023-07-30 DIAGNOSIS — G8918 Other acute postprocedural pain: Secondary | ICD-10-CM | POA: Diagnosis not present

## 2023-08-26 DIAGNOSIS — F331 Major depressive disorder, recurrent, moderate: Secondary | ICD-10-CM | POA: Diagnosis not present

## 2023-09-02 DIAGNOSIS — F331 Major depressive disorder, recurrent, moderate: Secondary | ICD-10-CM | POA: Diagnosis not present

## 2023-09-09 DIAGNOSIS — F331 Major depressive disorder, recurrent, moderate: Secondary | ICD-10-CM | POA: Diagnosis not present

## 2023-09-12 ENCOUNTER — Encounter: Payer: Self-pay | Admitting: Family Medicine

## 2023-09-12 ENCOUNTER — Ambulatory Visit: Payer: BC Managed Care – PPO | Admitting: Family Medicine

## 2023-09-12 ENCOUNTER — Other Ambulatory Visit: Payer: Self-pay | Admitting: Family Medicine

## 2023-09-12 VITALS — BP 130/88 | HR 75 | Temp 98.2°F | Ht 68.0 in | Wt 248.8 lb

## 2023-09-12 DIAGNOSIS — R1031 Right lower quadrant pain: Secondary | ICD-10-CM | POA: Diagnosis not present

## 2023-09-12 DIAGNOSIS — E1142 Type 2 diabetes mellitus with diabetic polyneuropathy: Secondary | ICD-10-CM

## 2023-09-12 DIAGNOSIS — Z9884 Bariatric surgery status: Secondary | ICD-10-CM

## 2023-09-12 DIAGNOSIS — E785 Hyperlipidemia, unspecified: Secondary | ICD-10-CM | POA: Diagnosis not present

## 2023-09-12 DIAGNOSIS — R110 Nausea: Secondary | ICD-10-CM

## 2023-09-12 LAB — URINALYSIS, ROUTINE W REFLEX MICROSCOPIC
Bilirubin Urine: NEGATIVE
Hgb urine dipstick: NEGATIVE
Ketones, ur: NEGATIVE
Leukocytes,Ua: NEGATIVE
Nitrite: NEGATIVE
Specific Gravity, Urine: 1.01 (ref 1.000–1.030)
Total Protein, Urine: NEGATIVE
Urine Glucose: >=1000 — AB
Urobilinogen, UA: 0.2 (ref 0.0–1.0)
pH: 6 (ref 5.0–8.0)

## 2023-09-12 LAB — LIPID PANEL
Cholesterol: 229 mg/dL — ABNORMAL HIGH (ref 0–200)
HDL: 75.3 mg/dL (ref >39.00)
LDL Cholesterol: 124 mg/dL — ABNORMAL HIGH (ref 0–99)
NonHDL: 153.34
Total CHOL/HDL Ratio: 3
Triglycerides: 148 mg/dL (ref 0.0–149.0)
VLDL: 29.6 mg/dL (ref 0.0–40.0)

## 2023-09-12 LAB — COMPREHENSIVE METABOLIC PANEL
ALT: 18 U/L (ref 0–35)
AST: 17 U/L (ref 0–37)
Albumin: 4.3 g/dL (ref 3.5–5.2)
Alkaline Phosphatase: 111 U/L (ref 39–117)
BUN: 21 mg/dL (ref 6–23)
CO2: 28 meq/L (ref 19–32)
Calcium: 8.9 mg/dL (ref 8.4–10.5)
Chloride: 102 meq/L (ref 96–112)
Creatinine, Ser: 0.89 mg/dL (ref 0.40–1.20)
GFR: 74.19 mL/min (ref >60.00)
Glucose, Bld: 97 mg/dL (ref 70–99)
Potassium: 3.9 meq/L (ref 3.5–5.1)
Sodium: 138 meq/L (ref 135–145)
Total Bilirubin: 0.3 mg/dL (ref 0.2–1.2)
Total Protein: 7.3 g/dL (ref 6.0–8.3)

## 2023-09-12 LAB — LIPASE: Lipase: 53 U/L (ref 11.0–59.0)

## 2023-09-12 LAB — HEMOGLOBIN A1C: Hgb A1c MFr Bld: 6.6 % — ABNORMAL HIGH (ref 4.6–6.5)

## 2023-09-12 LAB — MICROALBUMIN / CREATININE URINE RATIO
Creatinine,U: 45.8 mg/dL
Microalb Creat Ratio: 1.5 mg/g (ref 0.0–30.0)
Microalb, Ur: <0.7 mg/dL (ref 0.0–1.9)

## 2023-09-12 MED ORDER — ONDANSETRON HCL 4 MG PO TABS: 4.0000 mg | ORAL_TABLET | Freq: Three times a day (TID) | ORAL | 2 refills | Status: DC | PRN

## 2023-09-12 MED ORDER — FAMOTIDINE 20 MG PO TABS: 20.0000 mg | ORAL_TABLET | Freq: Two times a day (BID) | ORAL | 0 refills | Status: DC

## 2023-09-12 NOTE — Patient Instructions (Signed)
Goal Blood pressure is anything less than 140/90, diabetics they want you to be around 120/70

## 2023-09-12 NOTE — Progress Notes (Unsigned)
Established Patient Office Visit  Subjective   Patient ID: [name], female    DOB: 05/04/1971  Age: 53 y.o. MRN: [id number]  Chief Complaint  Patient presents with   Nausea    Patient complains of nausea with each meal x3 weeks    Pt reports new symptom of nausea with meals. States that she will be about halfway done with the meal and the nausea will start. Pt rpeorts no fever or chills, states that she still has some random right flank pain and the spasms in the LLQ, states that her bowels are still somewhat irregular, sometimes soft sometimes hard, etc.   Pt has been taking antacids over the counter-- TUMS, with mild improvement only. We reviewed her last CT scans and colonoscopy-- she does have chronic constipation off and on due to a redundant colon. PSH includes cholecystectomy and roux-en-y gastric bypass.    Current Outpatient Medications  Medication Instructions   Acetaminophen (TYLENOL PO) 1 capsule, Every 8 hours   aspirin EC 81 mg, Daily   B-12 1,000 mcg, Sublingual, Daily   empagliflozin (JARDIANCE) 10 MG TABS tablet TAKE 1 TABLET(10 MG) BY MOUTH DAILY BEFORE BREAKFAST   famotidine (PEPCID) 20 MG tablet TAKE 1 TABLET(20 MG) BY MOUTH TWICE DAILY   famotidine (PEPCID) 20 mg, Oral, 2 times daily   losartan (COZAAR) 100 MG tablet TAKE 1 TABLET(100 MG) BY MOUTH DAILY   Multiple Vitamin (MULTIVITAMIN ADULT PO) Take by mouth.   nystatin (MYCOSTATIN/NYSTOP) powder 1 application , Topical, 3 times daily, Apply to affected area for up to 7 days   ondansetron (ZOFRAN) 4 mg, Oral, Every 8 hours PRN   traZODone (DESYREL) 100 MG tablet 1 tablet, At bedtime PRN    Patient Active Problem List   Diagnosis Date Noted   Chronic nausea 09/15/2023   Type 2 diabetes mellitus with diabetic polyneuropathy, without long-term current use of insulin (HCC) 12/03/2021   Hypertension 12/03/2021   B12 deficiency 12/03/2021   Primary osteoarthritis of right knee 05/26/2018   Bilateral  nipple discharge 09/19/2017   Primary localized osteoarthritis of left knee 07/22/2017   Primary osteoarthritis of left knee 07/22/2017   IDA (iron deficiency anemia) 05/20/2017   Effusion of left knee 05/17/2017   Degenerative arthritis of right knee 02/13/2017   Degenerative arthritis of left knee 02/13/2017   Numbness and tingling of both lower extremities 10/18/2016   Genetic testing 10/18/2016   Family history of breast cancer    Multinodular goiter 04/24/2015   Mood disorder (HCC) 04/12/2015   Peroneal mononeuropathy 08/22/2014   Left foot drop 07/21/2014   Menorrhagia with irregular cycle 04/29/2014   S/P gastric bypass 11/02/2013   Hyperlipidemia 10/08/2013   Primary localized osteoarthrosis, lower leg 07/06/2013   Obesity, BMI unknown 04/23/2013   IUD contraception - inserted summer 2013 04/09/2013      Review of Systems  All other systems reviewed and are negative.     Objective:     BP 130/88   Pulse 75   Temp 98.2 F (36.8 C) (Oral)   Ht 5\' 8"  (1.727 m)   Wt 248 lb 12.8 oz (112.9 kg)   LMP 10/11/2019 (Approximate)   SpO2 98%   BMI 37.83 kg/m    Physical Exam Vitals reviewed.  Constitutional:      Appearance: Normal appearance. She is obese.  Cardiovascular:     Rate and Rhythm: Normal rate and regular rhythm.     Heart sounds: Normal heart sounds. No murmur  heard. Pulmonary:     Effort: Pulmonary effort is normal.     Breath sounds: Normal breath sounds. No wheezing or rales.  Abdominal:     General: Bowel sounds are normal. There is no distension.     Palpations: Abdomen is soft.     Tenderness: There is no guarding or rebound.  Musculoskeletal:     Right lower leg: No edema.     Left lower leg: No edema.  Neurological:     Mental Status: She is alert and oriented to person, place, and time. Mental status is at baseline.  Psychiatric:        Mood and Affect: Mood normal.        Behavior: Behavior normal.      The 10-year ASCVD risk  score (Arnett DK, et al., 2019) is: 3.7%    Assessment & Plan:  Chronic nausea Assessment & Plan: Persistent symptoms that are not resolved with BM improvement, will be checking CMP, lipase, and UA. Since her pain and nausea have persisted will send her for repeat CT scan  with contrast. She is a Gastric bypass patient and we need to rule out bowel obstruction, internal hernia, etc. Will treat with ondansetron 4 mg every 8 hours PRN and pepcid 20 mg BID-- I sent prescriptions to the pharmacy for her today.   I am also questioning on wether or not this could be an odd presentation of anginal equivalent pain since she has ot had any relief and there is no abdominal pathology found to explain her symptoms, will check her annual labs plus also a CT calcium score to look at her heart risk more carefully.   Orders: -     CT ABDOMEN PELVIS W CONTRAST; Future -     Comprehensive metabolic panel -     Lipase -     Urinalysis, Routine w reflex microscopic -     Famotidine; Take 1 tablet (20 mg total) by mouth 2 (two) times daily.  RLQ abdominal pain -     CT ABDOMEN PELVIS W CONTRAST; Future  History of Roux-en-Y gastric bypass -     CT ABDOMEN PELVIS W CONTRAST; Future  Type 2 diabetes mellitus with diabetic polyneuropathy, without long-term current use of insulin (HCC) Assessment & Plan: Usually under good control, no recent weight loss or gain, will order microalbumin and A1C today. Checking lipid panel -- she is not currently on a statin medication (not sure why), we discussed this medication but will wait for the CT calcium score ffirst.  Orders: -     Hemoglobin A1c -     Microalbumin / creatinine urine ratio  Hyperlipidemia, unspecified hyperlipidemia type -     Lipid panel -     CT CARDIAC SCORING ([facility] LOCATIONS ONLY); Future     Return in about 6 months (around 03/11/2024) for DM.    Karie Georges, MD

## 2023-09-13 ENCOUNTER — Encounter: Payer: Self-pay | Admitting: Family Medicine

## 2023-09-13 DIAGNOSIS — R110 Nausea: Secondary | ICD-10-CM

## 2023-09-15 ENCOUNTER — Telehealth: Payer: Self-pay

## 2023-09-15 DIAGNOSIS — R110 Nausea: Secondary | ICD-10-CM | POA: Insufficient documentation

## 2023-09-15 MED ORDER — ONDANSETRON HCL 4 MG PO TABS: 4.0000 mg | ORAL_TABLET | Freq: Three times a day (TID) | ORAL | 2 refills | Status: AC | PRN

## 2023-09-15 MED ORDER — FAMOTIDINE 20 MG PO TABS: 20.0000 mg | ORAL_TABLET | Freq: Two times a day (BID) | ORAL | Status: DC

## 2023-09-15 NOTE — Telephone Encounter (Signed)
Copied from CRM [id number]. Topic: General - Other >> Sep 15, 2023 10:42 AM Leavy Cella D wrote: Reason for CRM: Felicia from [facility] called regarding patients upcoming appointment on Feb 7th . An authorization is needed for patients appointment . Authorization is need by Feb. 6th no later than 10am . Please fax authorization to [phone] . If you have any questions you may contact Felicia at [phone]

## 2023-09-15 NOTE — Assessment & Plan Note (Signed)
Usually under good control, no recent weight loss or gain, will order microalbumin and A1C today. Checking lipid panel -- she is not currently on a statin medication (not sure why), we discussed this medication but will wait for the CT calcium score ffirst.

## 2023-09-15 NOTE — Assessment & Plan Note (Addendum)
Persistent symptoms that are not resolved with BM improvement, will be checking CMP, lipase, and UA. Since her pain and nausea have persisted will send her for repeat CT scan  with contrast. She is a Gastric bypass patient and we need to rule out bowel obstruction, internal hernia, etc. Will treat with ondansetron 4 mg every 8 hours PRN and pepcid 20 mg BID-- I sent prescriptions to the pharmacy for her today.   I am also questioning on wether or not this could be an odd presentation of anginal equivalent pain since she has ot had any relief and there is no abdominal pathology found to explain her symptoms, will check her annual labs plus also a CT calcium score to look at her heart risk more carefully.

## 2023-09-16 ENCOUNTER — Encounter: Payer: Self-pay | Admitting: Family Medicine

## 2023-09-18 ENCOUNTER — Ambulatory Visit: Payer: BC Managed Care – PPO | Admitting: Family Medicine

## 2023-09-19 ENCOUNTER — Ambulatory Visit
Admission: RE | Admit: 2023-09-19 | Discharge: 2023-09-19 | Disposition: A | Discharge status: Home / Self Care | Payer: BC Managed Care – PPO | Source: Ambulatory Visit | Attending: Family Medicine | Admitting: Family Medicine

## 2023-09-19 DIAGNOSIS — R1031 Right lower quadrant pain: Secondary | ICD-10-CM

## 2023-09-19 DIAGNOSIS — R110 Nausea: Secondary | ICD-10-CM | POA: Diagnosis not present

## 2023-09-19 DIAGNOSIS — Z9884 Bariatric surgery status: Secondary | ICD-10-CM | POA: Diagnosis not present

## 2023-09-19 DIAGNOSIS — R109 Unspecified abdominal pain: Secondary | ICD-10-CM | POA: Diagnosis not present

## 2023-09-19 MED ORDER — IOPAMIDOL (ISOVUE-300) INJECTION 61%
100.0000 mL | Freq: Once | INTRAVENOUS | Status: AC | PRN
  Administered 2023-09-19: 100 mL via INTRAVENOUS

## 2023-09-22 ENCOUNTER — Encounter: Payer: Self-pay | Admitting: Family Medicine

## 2023-09-22 ENCOUNTER — Ambulatory Visit (HOSPITAL_COMMUNITY)
Admission: RE | Admit: 2023-09-22 | Discharge: 2023-09-22 | Disposition: A | Discharge status: Home / Self Care | Payer: Self-pay | Source: Ambulatory Visit | Attending: Family Medicine | Admitting: Family Medicine

## 2023-09-22 ENCOUNTER — Encounter (HOSPITAL_COMMUNITY): Payer: Self-pay

## 2023-09-22 DIAGNOSIS — E785 Hyperlipidemia, unspecified: Secondary | ICD-10-CM | POA: Insufficient documentation

## 2023-11-04 ENCOUNTER — Encounter: Payer: Self-pay | Admitting: Family Medicine

## 2023-11-04 ENCOUNTER — Ambulatory Visit: Admitting: Family Medicine

## 2023-11-04 VITALS — BP 150/92 | HR 88 | Temp 97.9°F | Ht 68.0 in | Wt 248.4 lb

## 2023-11-04 DIAGNOSIS — U071 COVID-19: Secondary | ICD-10-CM | POA: Diagnosis not present

## 2023-11-04 DIAGNOSIS — R509 Fever, unspecified: Secondary | ICD-10-CM

## 2023-11-04 DIAGNOSIS — M545 Low back pain, unspecified: Secondary | ICD-10-CM | POA: Diagnosis not present

## 2023-11-04 DIAGNOSIS — R519 Headache, unspecified: Secondary | ICD-10-CM

## 2023-11-04 LAB — POC COVID19 BINAXNOW: SARS Coronavirus 2 Ag: POSITIVE — AB

## 2023-11-04 LAB — POCT INFLUENZA A/B
Influenza A, POC: NEGATIVE
Influenza B, POC: NEGATIVE

## 2023-11-04 MED ORDER — NIRMATRELVIR/RITONAVIR (PAXLOVID)TABLET: 3.0000 | ORAL_TABLET | Freq: Two times a day (BID) | ORAL | 0 refills | Status: AC

## 2023-11-04 NOTE — Progress Notes (Signed)
 Acute Office Visit  Subjective:     Patient ID: [name], female    DOB: 1971-01-28, 53 y.o.   MRN: [id number]  Chief Complaint  Patient presents with   Headache    X2 days   Sore Throat    X2 weeks   Fever    Temperature of 101.3 degrees x2 days, tried Tylenol with some relief, traveled to [place] last week   Flank Pain    X1 day, denies dysuria    Flank Pain   Patient is in today for fever, body aches, headaches and flank pain for the last 2 days. She reports she has been traveling internationally for the past week-- went to [place] for vacation recently. COVID testing is positive today.  Review of Systems  All other systems reviewed and are negative.       Objective:    BP (!) 150/92   Pulse 88   Temp 97.9 F (36.6 C) (Oral)   Ht 5\' 8"  (1.727 m)   Wt 248 lb 6.4 oz (112.7 kg)   LMP 10/11/2019 (Approximate)   SpO2 100%   BMI 37.77 kg/m    Physical Exam Constitutional:      Appearance: She is well-developed. She is obese.  Cardiovascular:     Rate and Rhythm: Normal rate and regular rhythm.     Heart sounds: Normal heart sounds.  Pulmonary:     Effort: Pulmonary effort is normal.     Breath sounds: Normal breath sounds. No wheezing, rhonchi or rales.  Neurological:     Mental Status: She is alert.     Results for orders placed or performed in visit on 11/04/23  POC COVID-19  Result Value Ref Range   SARS Coronavirus 2 Ag Positive (A) Negative  POC Influenza A/B  Result Value Ref Range   Influenza A, POC Negative Negative   Influenza B, POC Negative Negative        Assessment & Plan:   Problem List Items Addressed This Visit   None Visit Diagnoses       Nonintractable headache, unspecified chronicity pattern, unspecified headache type    -  Primary   Relevant Orders   POC COVID-19 (Completed)   POC Influenza A/B (Completed)     Fever, unspecified fever cause       Relevant Orders   POC COVID-19 (Completed)   POC  Influenza A/B (Completed)     Low back pain without sciatica, unspecified back pain laterality, unspecified chronicity       Relevant Orders   POC COVID-19 (Completed)   POC Influenza A/B (Completed)     COVID-19       Relevant Medications   nirmatrelvir/ritonavir (PAXLOVID) 20 x 150 MG & 10 x 100MG  TABS     Testing positive for COVID, will treat with Paxlovid therapy, GFR is normal and meds were reviewed to ensure no medication interactions. Counseled patient on isolation/ mask precautions and encouraged her to continue tylenol and ibuprofen PRN for fever and body aches.   Meds ordered this encounter  Medications   nirmatrelvir/ritonavir (PAXLOVID) 20 x 150 MG & 10 x 100MG  TABS    Sig: Take 3 tablets by mouth 2 (two) times daily for 5 days. (Take nirmatrelvir 150 mg two tablets twice daily for 5 days and ritonavir 100 mg one tablet twice daily for 5 days) Patient GFR is 74    Dispense:  30 tablet    Refill:  0    No  follow-ups on file.  Karie Georges, MD

## 2023-12-19 DIAGNOSIS — H40013 Open angle with borderline findings, low risk, bilateral: Secondary | ICD-10-CM | POA: Diagnosis not present

## 2024-01-11 ENCOUNTER — Other Ambulatory Visit: Payer: Self-pay | Admitting: Family Medicine

## 2024-01-11 DIAGNOSIS — E1142 Type 2 diabetes mellitus with diabetic polyneuropathy: Secondary | ICD-10-CM

## 2024-01-15 ENCOUNTER — Ambulatory Visit (INDEPENDENT_AMBULATORY_CARE_PROVIDER_SITE_OTHER): Admitting: Family Medicine

## 2024-01-15 ENCOUNTER — Encounter: Payer: Self-pay | Admitting: Family Medicine

## 2024-01-15 VITALS — BP 140/90 | HR 77 | Temp 98.3°F | Ht 68.0 in | Wt 251.6 lb

## 2024-01-15 DIAGNOSIS — Z9884 Bariatric surgery status: Secondary | ICD-10-CM

## 2024-01-15 DIAGNOSIS — R110 Nausea: Secondary | ICD-10-CM | POA: Diagnosis not present

## 2024-01-15 DIAGNOSIS — R109 Unspecified abdominal pain: Secondary | ICD-10-CM

## 2024-01-15 MED ORDER — KETOROLAC TROMETHAMINE 60 MG/2ML IM SOLN
60.0000 mg | Freq: Once | INTRAMUSCULAR | Status: AC
  Administered 2024-01-15: 60 mg via INTRAMUSCULAR

## 2024-01-15 NOTE — Progress Notes (Signed)
 Established Patient Office Visit  Subjective   Patient ID: [name], female    DOB: Apr 25, 1971  Age: 53 y.o. MRN: [id number]  Chief Complaint  Patient presents with   Flank Pain    Patient complains of recurrent pain and states right flank pain present constantly     Patient states that the right flank pain she had a few months ago never really left but it has progressed and it feels like a constant pain with sharp shooting intermittently, states that it got worse about 4 weeks ago. States that she feels nauseated a lot of times after she eats, often will throw up if she doesn't take the zofran .     Current Outpatient Medications  Medication Instructions   Acetaminophen  (TYLENOL  PO) 1 capsule, Every 8 hours   aspirin  EC 81 mg, Daily   B-12 1,000 mcg, Sublingual, Daily   famotidine  (PEPCID ) 20 MG tablet TAKE 1 TABLET(20 MG) BY MOUTH TWICE DAILY   famotidine  (PEPCID ) 20 mg, Oral, 2 times daily   JARDIANCE  10 MG TABS tablet TAKE 1 TABLET(10 MG) BY MOUTH DAILY BEFORE BREAKFAST   losartan  (COZAAR ) 100 MG tablet TAKE 1 TABLET(100 MG) BY MOUTH DAILY   Multiple Vitamin (MULTIVITAMIN ADULT PO) Take by mouth.   nystatin  (MYCOSTATIN /NYSTOP ) powder 1 application , Topical, 3 times daily, Apply to affected area for up to 7 days   ondansetron  (ZOFRAN ) 4 mg, Oral, Every 8 hours PRN   traZODone (DESYREL) 100 MG tablet 1 tablet, At bedtime PRN    Patient Active Problem List   Diagnosis Date Noted   Chronic nausea 09/15/2023   Type 2 diabetes mellitus with diabetic polyneuropathy, without long-term current use of insulin  (HCC) 12/03/2021   Hypertension 12/03/2021   B12 deficiency 12/03/2021   Primary osteoarthritis of right knee 05/26/2018   Bilateral nipple discharge 09/19/2017   Primary localized osteoarthritis of left knee 07/22/2017   Primary osteoarthritis of left knee 07/22/2017   IDA (iron deficiency anemia) 05/20/2017   Effusion of left knee 05/17/2017   Degenerative  arthritis of right knee 02/13/2017   Degenerative arthritis of left knee 02/13/2017   Numbness and tingling of both lower extremities 10/18/2016   Genetic testing 10/18/2016   Family history of breast cancer    Multinodular goiter 04/24/2015   Mood disorder (HCC) 04/12/2015   Peroneal mononeuropathy 08/22/2014   Left foot drop 07/21/2014   Menorrhagia with irregular cycle 04/29/2014   S/P gastric bypass 11/02/2013   Hyperlipidemia 10/08/2013   Primary localized osteoarthrosis, lower leg 07/06/2013   Obesity, BMI unknown 04/23/2013   IUD contraception - inserted summer 2013 04/09/2013      Review of Systems  Constitutional:  Negative for chills, diaphoresis, fever and weight loss.  Gastrointestinal:  Positive for abdominal pain and nausea. Negative for blood in stool, constipation and diarrhea.  All other systems reviewed and are negative.     Objective:     BP (!) 140/90   Pulse 77   Temp 98.3 F (36.8 C) (Oral)   Ht 5\' 8"  (1.727 m)   Wt 251 lb 9.6 oz (114.1 kg)   LMP 10/11/2019 (Approximate)   SpO2 98%   BMI 38.26 kg/m    Physical Exam Vitals reviewed.  Constitutional:      Appearance: She is well-developed. She is obese.  Cardiovascular:     Rate and Rhythm: Normal rate and regular rhythm.     Heart sounds: Normal heart sounds.  Pulmonary:     Effort:  Pulmonary effort is normal.     Breath sounds: Normal breath sounds. No wheezing, rhonchi or rales.  Neurological:     Mental Status: She is alert.      No results found for any visits on 01/15/24.    The 10-year ASCVD risk score (Arnett DK, et al., 2019) is: 4.2%    Assessment & Plan:  Chronic nausea -     Hepatic function panel; Future -     Lipase; Future -     Ambulatory referral to Gastroenterology  Right flank pain -     Hepatic function panel; Future -     Ambulatory referral to Gastroenterology -     CBC with Differential/Platelet; Future -     Urinalysis, Routine w reflex microscopic;  Future -     Ketorolac  Tromethamine   S/P gastric bypass -     Vitamin B12; Future   Will perform work up again and refer patient to gastro for further evaluation and testing, Last CT was unrevealing of a cause.... I gave pt an injection of ketorolac  in office today for temporary relief of the pain. Etiology is still unclear. Continue ondansetron  PRN for nausea  No follow-ups on file.    Aida House, MD

## 2024-01-16 ENCOUNTER — Other Ambulatory Visit (INDEPENDENT_AMBULATORY_CARE_PROVIDER_SITE_OTHER)

## 2024-01-16 DIAGNOSIS — R110 Nausea: Secondary | ICD-10-CM

## 2024-01-16 DIAGNOSIS — R109 Unspecified abdominal pain: Secondary | ICD-10-CM | POA: Diagnosis not present

## 2024-01-16 DIAGNOSIS — Z9884 Bariatric surgery status: Secondary | ICD-10-CM

## 2024-01-16 LAB — CBC WITH DIFFERENTIAL/PLATELET
Basophils Absolute: 0.1 K/uL (ref 0.0–0.1)
Basophils Relative: 1.1 % (ref 0.0–3.0)
Eosinophils Absolute: 0.2 K/uL (ref 0.0–0.7)
Eosinophils Relative: 2.9 % (ref 0.0–5.0)
HCT: 40.8 % (ref 36.0–46.0)
Hemoglobin: 13.5 g/dL (ref 12.0–15.0)
Lymphocytes Relative: 27.7 % (ref 12.0–46.0)
Lymphs Abs: 2 K/uL (ref 0.7–4.0)
MCHC: 33 g/dL (ref 30.0–36.0)
MCV: 94.1 fl (ref 78.0–100.0)
Monocytes Absolute: 0.6 K/uL (ref 0.1–1.0)
Monocytes Relative: 8.7 % (ref 3.0–12.0)
Neutro Abs: 4.4 K/uL (ref 1.4–7.7)
Neutrophils Relative %: 59.6 % (ref 43.0–77.0)
Platelets: 317 K/uL (ref 150.0–400.0)
RBC: 4.34 Mil/uL (ref 3.87–5.11)
RDW: 13.7 % (ref 11.5–15.5)
WBC: 7.4 K/uL (ref 4.0–10.5)

## 2024-01-16 LAB — HEPATIC FUNCTION PANEL
ALT: 21 U/L (ref 0–35)
AST: 23 U/L (ref 0–37)
Albumin: 4.2 g/dL (ref 3.5–5.2)
Alkaline Phosphatase: 101 U/L (ref 39–117)
Bilirubin, Direct: 0.1 mg/dL (ref 0.0–0.3)
Total Bilirubin: 0.4 mg/dL (ref 0.2–1.2)
Total Protein: 7 g/dL (ref 6.0–8.3)

## 2024-01-16 LAB — LIPASE: Lipase: 89 U/L — ABNORMAL HIGH (ref 11.0–59.0)

## 2024-01-20 ENCOUNTER — Encounter: Payer: Self-pay | Admitting: Family Medicine

## 2024-01-20 NOTE — Telephone Encounter (Signed)
 Noted- ok to close.

## 2024-01-21 ENCOUNTER — Ambulatory Visit: Admitting: Family Medicine

## 2024-01-22 LAB — VITAMIN B12: Vitamin B-12: 81 pg/mL — ABNORMAL LOW (ref 211–911)

## 2024-01-23 ENCOUNTER — Ambulatory Visit: Payer: Self-pay | Admitting: Family Medicine

## 2024-02-04 NOTE — Progress Notes (Unsigned)
 JAMISE PENTLAND [id number] 05-04-1971   Chief Complaint: Nausea, right flank pain  Referring Provider: Ozell Heron HERO, MD Primary GI MD: Dr. Avram  HPI: [name] is a 53 y.o. female with past medical history of anxiety/depression, arthritis, clotting disorder, DVT 2012, diabetes, HTN, neuropathy, thyroid  nodule, cholecystectomy, Roux-en-Y gastric bypass 2015 who presents today for a complaint of nausea and right flank pain.    Patient last seen in office 05/17/2021 by Delon Failing, PA for acute on chronic LUQ abdominal pain.  CT had been unrevealing other than redundant colon.  She was scheduled for diagnostic colonoscopy which was normal aside from redundant colon.  CT A/P 09/19/2023 showed prior gastric bypass surgery and cholecystectomy, but no acute findings.  Lab 01/16/2024: Normal CBC, low vitamin B12 81, lipase elevated at 89, normal hepatic function panel  Previous GI Procedures/Imaging   CT A/P 09/19/2023 Prior gastric bypass surgery and cholecystectomy. No acute findings or other significant abnormality.  Colonoscopy 05/23/2021 - The examined portion of the ileum was normal.  - Redundant colon.  - The examination was otherwise normal on direct and retroflexion views. - No specimens collected. - Recall 10 years  Colonoscopy 04/10/2017 - The entire examined colon is normal on direct and retroflexion views.  - No specimens collected. - Recall 10 years  EGD 04/10/2017 - Normal esophagus.  - Gastric bypass with a pouch 4 cm in length. Gastrojejunal anastomosis ( afferent - short - and efferent limbs) characterized by healthy appearing mucosa.  - Normal examined jejunum.  - No specimens collected.  Past Medical History:  Diagnosis Date   Allergy    Anxiety and depression    Arthritis    Clotting disorder (HCC)    Complication of anesthesia    has been hard to wake up post-op   Dental crowns present    Depression    Diabetes (HCC)    resolved with  gastric bypass, no meds   DVT of lower extremity (deep venous thrombosis) (HCC) 2012   LLE   Dysmenorrhea    hx endometriosis and fibroid   Elevated prolactin level 2021   Family history of breast cancer    Headache(784.0)    tension or sinus   History of MRSA infection prior to 2012   lasted 5-6 yr.   Hx of nipple discharge    Bilateral.  Long standing.  Negative work up.    Hypertension    Jaw snapping    states jaw pops   Left foot drop 2016   --[facility] [facility]   Neuromuscular disorder (HCC)    neuropathy in hands,feet,face   Neuropathy 2015   hands, feet and face--[facility]   Prothrombin gene mutation (HCC) 2018   single gene mutation   Right knee pain    hx meniscal injury, chondromalacia, OA   Thyroid  nodule     Past Surgical History:  Procedure Laterality Date   BREAST BIOPSY Left 01/06/2020   BREAST BIOPSY Right 01/17/2020   CHOLECYSTECTOMY  1990's   DILITATION & CURRETTAGE/HYSTROSCOPY WITH NOVASURE ABLATION N/A 05/30/2014   Procedure: DILATATION & CURETTAGE/HYSTEROSCOPY WITH attempted NOVASURE ABLATION and with IUD removal ;  Surgeon: Bobie FORBES Crown de Charlynn FORBES Cary, MD;  Location: [facility];  Service: Gynecology;  Laterality: N/A;   GASTRIC ROUX-EN-Y N/A 11/02/2013   Procedure: LAPAROSCOPIC ROUX-EN-Y GASTRIC BYPASS WITH UPPER ENDOSCOPY;  Surgeon: Camellia HERO Blush, MD;  Location: [facility];  Service: General;  Laterality: N/A;   HAND SURGERY Left  X 3 - spider bite and MRSA infection   HYSTEROSCOPY WITH RESECTOSCOPE N/A 05/30/2014   Procedure: HYSTEROSCOPY WITH RESECTOSCOPE;  Surgeon: Bobie FORBES Crown de Charlynn FORBES Cary, MD;  Location: [facility];  Service: Gynecology;  Laterality: N/A;   KNEE ARTHROSCOPY Right 12/29/2012   Procedure: RIGHT KNEE ARTHROSCOPY  PARTIAL MEDIAL AND LATERAL MENISECTOMY WITH CHONDROPLASTY;  Surgeon: Maude KANDICE Herald, MD;  Location: [facility];  Service: Orthopedics;  Laterality: Right;   PELVIC LAPAROSCOPY  1990's    d/t endometriosis   TONSILLECTOMY  as child   TOTAL KNEE ARTHROPLASTY Left 07/22/2017   Procedure: TOTAL KNEE ARTHROPLASTY;  Surgeon: Herald Maude, MD;  Location: [facility];  Service: Orthopedics;  Laterality: Left;   TOTAL KNEE ARTHROPLASTY Right 05/26/2018   Procedure: RIGHT TOTAL KNEE ARTHROPLASTY;  Surgeon: Herald Maude, MD;  Location: [facility];  Service: Orthopedics;  Laterality: Right;   WRIST SURGERY Right 2005    Current Outpatient Medications  Medication Sig Dispense Refill   Acetaminophen  (TYLENOL  PO) Take 1 capsule by mouth every 8 (eight) hours.     aspirin  EC 81 MG tablet Take 81 mg by mouth daily. Swallow whole.     Cyanocobalamin  (B-12) 1000 MCG SUBL Place 1,000 mcg under the tongue daily. 90 tablet 1   famotidine  (PEPCID ) 20 MG tablet TAKE 1 TABLET(20 MG) BY MOUTH TWICE DAILY 180 tablet 0   famotidine  (PEPCID ) 20 MG tablet Take 1 tablet (20 mg total) by mouth 2 (two) times daily.     JARDIANCE  10 MG TABS tablet TAKE 1 TABLET(10 MG) BY MOUTH DAILY BEFORE BREAKFAST 90 tablet 1   losartan  (COZAAR ) 100 MG tablet TAKE 1 TABLET(100 MG) BY MOUTH DAILY 90 tablet 3   Multiple Vitamin (MULTIVITAMIN ADULT PO) Take by mouth.     nystatin  (MYCOSTATIN /NYSTOP ) powder Apply 1 application. topically 3 (three) times daily. Apply to affected area for up to 7 days 30 g 3   ondansetron  (ZOFRAN ) 4 MG tablet Take 1 tablet (4 mg total) by mouth every 8 (eight) hours as needed for nausea or vomiting. 30 tablet 2   traZODone (DESYREL) 100 MG tablet Take 1 tablet by mouth at bedtime as needed.     No current facility-administered medications for this visit.    Allergies as of 02/05/2024 - Review Complete 01/15/2024  Allergen Reaction Noted   Bactrim [sulfamethoxazole-trimethoprim] Other (See Comments) 10/26/2013   Ibuprofen Other (See Comments) 05/19/2015   Lamotrigine Hives 05/26/2012   Sulfa antibiotics Other (See Comments) 09/22/2012    Family History  Problem Relation Age of Onset    Diabetes Mother        Living   Kidney disease Mother    Hypertension Mother    Breast cancer Mother 62       dx'd with breast ca x2; 2nd around 47; 3rd 60's   Diabetes Maternal Grandmother 79       dec   Breast cancer Maternal Grandmother        dx in her 49s   Arthritis Father    Healthy Sister    Aneurysm Sister 48       brain   High blood pressure Sister    Diabetes Maternal Grandfather    Stroke Maternal Grandfather    Other Paternal Grandmother        died in childbirth   Other Paternal Grandfather        unknown   Healthy Daughter    Breast cancer Other  2 great aunts   Thyroid  disease Neg Hx     Social History   Tobacco Use   Smoking status: Never   Smokeless tobacco: Never  Vaping Use   Vaping status: Never Used  Substance Use Topics   Alcohol use: Yes    Alcohol/week: 4.0 - 6.0 standard drinks of alcohol    Types: 4 - 6 Glasses of wine per week   Drug use: No     Review of Systems:    Constitutional: No weight loss, fever, chills, weakness or fatigue Eyes: No change in vision Ears, Nose, Throat:  No change in hearing or congestion Skin: No rash or itching Cardiovascular: No chest pain, chest pressure or palpitations   Respiratory: No SOB or cough Gastrointestinal: See HPI and otherwise negative Genitourinary: No dysuria or change in urinary frequency Neurological: No headache, dizziness or syncope Musculoskeletal: No new muscle or joint pain Hematologic: No bleeding or bruising    Physical Exam:  Vital signs: LMP 10/11/2019 (Approximate)   Constitutional: NAD, Well developed, Well nourished, alert and cooperative Head:  Normocephalic and atraumatic.  Eyes: No scleral icterus. Conjunctiva pink. Mouth: No oral lesions. Respiratory: Respirations even and unlabored. Lungs clear to auscultation bilaterally.  No wheezes, crackles, or rhonchi.  Cardiovascular:  Regular rate and rhythm. No murmurs. No peripheral edema. Gastrointestinal:  Soft,  nondistended, nontender. No rebound or guarding. Normal bowel sounds. No appreciable masses or hepatomegaly. Rectal:  Not performed.  Neurologic:  Alert and oriented x4;  grossly normal neurologically.  Skin:   Dry and intact without significant lesions or rashes. Psychiatric: Oriented to person, place and time. Demonstrates good judgement and reason without abnormal affect or behaviors.   RELEVANT LABS AND IMAGING: CBC    Component Value Date/Time   WBC 7.4 01/16/2024 1331   RBC 4.34 01/16/2024 1331   HGB 13.5 01/16/2024 1331   HGB 13.6 10/29/2017 1335   HGB 13.3 09/19/2017 1650   HGB 13.0 05/14/2017 1054   HCT 40.8 01/16/2024 1331   HCT 42.1 09/19/2017 1650   HCT 39.8 05/14/2017 1054   PLT 317.0 01/16/2024 1331   PLT 371 10/29/2017 1335   PLT 380 (H) 09/19/2017 1650   MCV 94.1 01/16/2024 1331   MCV 91 09/19/2017 1650   MCV 95 05/14/2017 1054   MCH 30.4 07/05/2019 0915   MCHC 33.0 01/16/2024 1331   RDW 13.7 01/16/2024 1331   RDW 13.7 09/19/2017 1650   RDW 12.9 05/14/2017 1054   LYMPHSABS 2.0 01/16/2024 1331   LYMPHSABS 1.8 05/14/2017 1054   MONOABS 0.6 01/16/2024 1331   EOSABS 0.2 01/16/2024 1331   EOSABS 0.3 05/14/2017 1054   BASOSABS 0.1 01/16/2024 1331   BASOSABS 0.1 05/14/2017 1054    CMP     Component Value Date/Time   NA 138 09/12/2023 1417   NA 137 05/14/2017 1054   K 3.9 09/12/2023 1417   K 3.6 05/14/2017 1054   CL 102 09/12/2023 1417   CO2 28 09/12/2023 1417   CO2 26 05/14/2017 1054   GLUCOSE 97 09/12/2023 1417   GLUCOSE 108 05/14/2017 1054   BUN 21 09/12/2023 1417   BUN 10.7 05/14/2017 1054   CREATININE 0.89 09/12/2023 1417   CREATININE 0.83 10/29/2017 1335   CREATININE 0.7 05/14/2017 1054   CALCIUM 8.9 09/12/2023 1417   CALCIUM 9.2 05/14/2017 1054   PROT 7.0 01/16/2024 1331   PROT 7.3 05/14/2017 1054   ALBUMIN 4.2 01/16/2024 1331   ALBUMIN 3.6 05/14/2017 1054   AST 23  01/16/2024 1331   AST 21 10/29/2017 1335   AST 16 05/14/2017 1054   ALT  21 01/16/2024 1331   ALT 14 10/29/2017 1335   ALT 12 05/14/2017 1054   ALKPHOS 101 01/16/2024 1331   ALKPHOS 85 05/14/2017 1054   BILITOT 0.4 01/16/2024 1331   BILITOT 0.2 10/29/2017 1335   BILITOT 0.40 05/14/2017 1054   GFRNONAA >60 07/05/2019 0915   GFRNONAA >60 10/29/2017 1335   GFRAA >60 07/05/2019 0915   GFRAA >60 10/29/2017 1335     Assessment/Plan:       Camie Furbish, PA-C [facility] 02/04/2024, 11:20 AM  Patient Care Team: Ozell Heron HERO, MD as PCP - General (Family Medicine) Himmelrich, Camie RAMAN, RD (Inactive) as Dietitian Trixie File, MD as Consulting Physician (Internal Medicine) Cathlyn JAYSON Nikki Bobie FORBES, MD as Consulting Physician (Obstetrics and Gynecology)

## 2024-02-05 ENCOUNTER — Encounter: Payer: Self-pay | Admitting: Gastroenterology

## 2024-02-05 ENCOUNTER — Other Ambulatory Visit (INDEPENDENT_AMBULATORY_CARE_PROVIDER_SITE_OTHER)

## 2024-02-05 ENCOUNTER — Ambulatory Visit: Admitting: Family Medicine

## 2024-02-05 ENCOUNTER — Ambulatory Visit: Admitting: Gastroenterology

## 2024-02-05 VITALS — BP 160/90 | Ht 68.0 in | Wt 249.0 lb

## 2024-02-05 DIAGNOSIS — R109 Unspecified abdominal pain: Secondary | ICD-10-CM

## 2024-02-05 DIAGNOSIS — R112 Nausea with vomiting, unspecified: Secondary | ICD-10-CM

## 2024-02-05 DIAGNOSIS — R1011 Right upper quadrant pain: Secondary | ICD-10-CM

## 2024-02-05 DIAGNOSIS — R194 Change in bowel habit: Secondary | ICD-10-CM | POA: Diagnosis not present

## 2024-02-05 DIAGNOSIS — Z9884 Bariatric surgery status: Secondary | ICD-10-CM

## 2024-02-05 LAB — HEPATIC FUNCTION PANEL
ALT: 27 U/L (ref 0–35)
AST: 29 U/L (ref 0–37)
Albumin: 4.4 g/dL (ref 3.5–5.2)
Alkaline Phosphatase: 123 U/L — ABNORMAL HIGH (ref 39–117)
Bilirubin, Direct: 0.1 mg/dL (ref 0.0–0.3)
Total Bilirubin: 0.4 mg/dL (ref 0.2–1.2)
Total Protein: 7.5 g/dL (ref 6.0–8.3)

## 2024-02-05 LAB — C-REACTIVE PROTEIN: CRP: <1 mg/dL (ref 0.5–20.0)

## 2024-02-05 LAB — SEDIMENTATION RATE: Sed Rate: 30 mm/h (ref 0–30)

## 2024-02-05 LAB — TSH: TSH: 1.21 u[IU]/mL (ref 0.35–5.50)

## 2024-02-05 MED ORDER — PANTOPRAZOLE SODIUM 40 MG PO TBEC: 40.0000 mg | DELAYED_RELEASE_TABLET | Freq: Two times a day (BID) | ORAL | 3 refills | Status: AC

## 2024-02-05 NOTE — Patient Instructions (Signed)
 We have sent the following medications to your pharmacy for you to pick up at your convenience: Pantoprazole    Your provider has requested that you go to the basement level for lab work before leaving today. Press B on the elevator. The lab is located at the first door on the left as you exit the elevator.  Due to recent changes in healthcare laws, you may see the results of your imaging and laboratory studies on MyChart before your provider has had a chance to review them.  We understand that in some cases there may be results that are confusing or concerning to you. Not all laboratory results come back in the same time frame and the provider may be waiting for multiple results in order to interpret others.  Please give us  48 hours in order for your provider to thoroughly review all the results before contacting the office for clarification of your results.   You have been scheduled for an endoscopy. Please follow written instructions given to you at your visit today.  If you use inhalers (even only as needed), please bring them with you on the day of your procedure.  If you take any of the following medications, they will need to be adjusted prior to your procedure:   DO NOT TAKE 7 DAYS PRIOR TO TEST- Trulicity (dulaglutide) Ozempic, Wegovy (semaglutide) Mounjaro (tirzepatide) Bydureon Bcise (exanatide extended release)  DO NOT TAKE 1 DAY PRIOR TO YOUR TEST Rybelsus (semaglutide) Adlyxin (lixisenatide) Victoza (liraglutide) Byetta (exanatide) ___________________________________________________________________________   Thank you for choosing me and [facility].

## 2024-02-06 ENCOUNTER — Other Ambulatory Visit

## 2024-02-06 DIAGNOSIS — R112 Nausea with vomiting, unspecified: Secondary | ICD-10-CM | POA: Diagnosis not present

## 2024-02-06 DIAGNOSIS — Z9884 Bariatric surgery status: Secondary | ICD-10-CM | POA: Diagnosis not present

## 2024-02-06 DIAGNOSIS — R1011 Right upper quadrant pain: Secondary | ICD-10-CM | POA: Diagnosis not present

## 2024-02-06 DIAGNOSIS — R109 Unspecified abdominal pain: Secondary | ICD-10-CM | POA: Diagnosis not present

## 2024-02-06 LAB — IGA: Immunoglobulin A: 242 mg/dL (ref 47–310)

## 2024-02-06 LAB — TISSUE TRANSGLUTAMINASE, IGA: (tTG) Ab, IgA: <1 U/mL

## 2024-02-12 NOTE — Progress Notes (Signed)
 [facility] Quality Team Note  Name: [name] Date of Birth: 1971/01/03 MRN: [id number] Date: 02/12/2024  [facility] Quality Team has reviewed this patient's chart, please see recommendations below:  [facility] Quality Other; (CHART REVIEWED. ABSTRACTED MOST RECENT A1C. PATIENT NEEDS URINE MICROALBUMIN/CREATININE RATIO AND EGFR FOR GAP CLOSURE.)

## 2024-02-13 LAB — CALPROTECTIN: Calprotectin: 80 ug/g

## 2024-02-16 ENCOUNTER — Ambulatory Visit: Payer: Self-pay | Admitting: Gastroenterology

## 2024-02-17 ENCOUNTER — Encounter: Payer: Self-pay | Admitting: Internal Medicine

## 2024-02-17 ENCOUNTER — Ambulatory Visit: Admitting: Internal Medicine

## 2024-02-17 VITALS — BP 165/86 | HR 80 | Temp 98.1°F | Resp 13 | Ht 68.0 in | Wt 249.0 lb

## 2024-02-17 DIAGNOSIS — R112 Nausea with vomiting, unspecified: Secondary | ICD-10-CM | POA: Diagnosis not present

## 2024-02-17 DIAGNOSIS — R1011 Right upper quadrant pain: Secondary | ICD-10-CM | POA: Diagnosis not present

## 2024-02-17 DIAGNOSIS — R748 Abnormal levels of other serum enzymes: Secondary | ICD-10-CM

## 2024-02-17 DIAGNOSIS — R197 Diarrhea, unspecified: Secondary | ICD-10-CM

## 2024-02-17 MED ORDER — SODIUM CHLORIDE 0.9 % IV SOLN: 500.0000 mL | Freq: Once | INTRAVENOUS | Status: DC

## 2024-02-17 NOTE — Progress Notes (Signed)
 Pt's states no medical or surgical changes since previsit or office visit.

## 2024-02-17 NOTE — Progress Notes (Signed)
 History and Physical Interval Note:  02/17/2024 12:29 PM  [name]  has presented today for endoscopic procedure(s), with the diagnosis of  Encounter Diagnosis  Name Primary?   Nausea and vomiting, unspecified vomiting type Yes  .  The various methods of evaluation and treatment have been discussed with the patient and/or family. After consideration of risks, benefits and other options for treatment, the patient has consented to  the endoscopic procedure(s).   The patient's history has been reviewed, patient examined, no change in status, stable for endoscopic procedure(s).  I have reviewed the patient's chart and labs.  Questions were answered to the patient's satisfaction.     Lupita [name], MD, [facility]

## 2024-02-17 NOTE — Progress Notes (Signed)
 History and Physical Interval Note:  02/17/2024 12:37 PM  [name]  has presented today for endoscopic procedure(s), with the diagnosis of  Encounter Diagnoses  Name Primary?   Nausea and vomiting, unspecified vomiting type Yes   RUQ pain   .  The various methods of evaluation and treatment have been discussed with the patient and/or family. After consideration of risks, benefits and other options for treatment, the patient has consented to  the endoscopic procedure(s).   The patient's history has been reviewed, patient examined, no change in status, stable for endoscopic procedure(s).  I have reviewed the patient's chart and labs.  Questions were answered to the patient's satisfaction.     Lupita [name], MD, [facility]

## 2024-02-17 NOTE — Patient Instructions (Addendum)
 Overall I think things look okay.  There was some red color change and what we call friability in the stomach pouch so I took biopsies to look for possible infection.  The tenderness on your right lateral chest area is musculoskeletal, in my opinion.  It is possible it resulted from the vomiting and straining things there.  I would try purchasing diclofenac  gel 1% and applying that on there 3-4 times a day.  You could also try heating pad.  CT scan in February did not show other abnormalities in this area.  Once I see the pathology results we will make additional recommendations.  You do need to return for some blood tests in 1 month, please come back in early August and we will also recheck your calprotectin level.  I appreciate the opportunity to care for you. Lupita [name], MD, [facility]    Discharge instructions given. Biopsies taken. Resume previous medications. YOU HAD AN ENDOSCOPIC PROCEDURE TODAY AT THE [facility]:   Refer to the procedure report that was given to you for any specific questions about what was found during the examination.  If the procedure report does not answer your questions, please call your gastroenterologist to clarify.  If you requested that your care partner not be given the details of your procedure findings, then the procedure report has been included in a sealed envelope for you to review at your convenience later.  YOU SHOULD EXPECT: Some feelings of bloating in the abdomen. Passage of more gas than usual.  Walking can help get rid of the air that was put into your GI tract during the procedure and reduce the bloating. If you had a lower endoscopy (such as a colonoscopy or flexible sigmoidoscopy) you may notice spotting of blood in your stool or on the toilet paper. If you underwent a bowel prep for your procedure, you may not have a normal bowel movement for a few days.  Please Note:  You might notice some irritation and congestion in your nose or  some drainage.  This is from the oxygen used during your procedure.  There is no need for concern and it should clear up in a day or so.  SYMPTOMS TO REPORT IMMEDIATELY:   Following upper endoscopy (EGD)  Vomiting of blood or coffee ground material  New chest pain or pain under the shoulder blades  Painful or persistently difficult swallowing  New shortness of breath  Fever of 100F or higher  Black, tarry-looking stools  For urgent or emergent issues, a gastroenterologist can be reached at any hour by calling [phone]. Do not use MyChart messaging for urgent concerns.    DIET:  We do recommend a small meal at first, but then you may proceed to your regular diet.  Drink plenty of fluids but you should avoid alcoholic beverages for 24 hours.  ACTIVITY:  You should plan to take it easy for the rest of today and you should NOT DRIVE or use heavy machinery until tomorrow (because of the sedation medicines used during the test).    FOLLOW UP: Our staff will call the number listed on your records the next business day following your procedure.  We will call around 7:15- 8:00 am to check on you and address any questions or concerns that you may have regarding the information given to you following your procedure. If we do not reach you, we will leave a message.     If any biopsies were taken you will  be contacted by phone or by letter within the next 1-3 weeks.  Please call us  at [phone] if you have not heard about the biopsies in 3 weeks.    SIGNATURES/CONFIDENTIALITY: You and/or your care partner have signed paperwork which will be entered into your electronic medical record.  These signatures attest to the fact that that the information above on your After Visit Summary has been reviewed and is understood.  Full responsibility of the confidentiality of this discharge information lies with you and/or your care-partner.

## 2024-02-17 NOTE — Progress Notes (Signed)
 To pacu, VSS. Report to [email or web address]

## 2024-02-17 NOTE — Progress Notes (Signed)
 Called to room to assist during endoscopic procedure.  Patient ID and intended procedure confirmed with present staff. Received instructions for my participation in the procedure from the performing physician.

## 2024-02-17 NOTE — Op Note (Addendum)
 [facility] Patient Name: [name] Procedure Date: 02/17/2024 12:32 PM MRN: [id number] Endoscopist: Lupita FORBES Commander , MD, [id number] Age: 53 Referring MD:  Date of Birth: Jun 19, 1971 Gender: Female Account #: [id number] Procedure:                Upper GI endoscopy Indications:              Abdominal pain in the right upper quadrant,                            Abdominal pain in the left upper quadrant, Nausea                            with vomiting Medicines:                Monitored Anesthesia Care Procedure:                Pre-Anesthesia Assessment:                           - Prior to the procedure, a History and Physical                            was performed, and patient medications and                            allergies were reviewed. The patient's tolerance of                            previous anesthesia was also reviewed. The risks                            and benefits of the procedure and the sedation                            options and risks were discussed with the patient.                            All questions were answered, and informed consent                            was obtained. Prior Anticoagulants: The patient has                            taken no anticoagulant or antiplatelet agents. ASA                            Grade Assessment: II - A patient with mild systemic                            disease. After reviewing the risks and benefits,                            the patient was deemed in satisfactory condition to  undergo the procedure.                           After obtaining informed consent, the endoscope was                            passed under direct vision. Throughout the                            procedure, the patient's blood pressure, pulse, and                            oxygen saturations were monitored continuously. The                            GIF [id number] #[id number] was introduced through  the                            mouth, and advanced to the second part of duodenum.                            The upper GI endoscopy was accomplished without                            difficulty. The patient tolerated the procedure                            well. Scope In: Scope Out: Findings:                 Evidence of a gastric bypass was found. A gastric                            pouch with a 4 cm length from the GE junction to                            the gastrojejunal anastomosis was found. The                            gastrojejunal anastomosis was characterized by                            healthy appearing mucosa. This was traversed.                            Biopsies were taken with a cold forceps for                            histology from an erythematous and slightly friable                            gastric poiuch. Verification of patient                            identification for the specimen  was done. Estimated                            blood loss was minimal.                           The exam was otherwise without abnormality.                           The gastric pouch was otherwise normal on                            retroflexion. Complications:            No immediate complications. Estimated Blood Loss:     Estimated blood loss was minimal. Impression:               - Gastric bypass with a pouch 4 cm in length.                            Gastrojejunal anastomosis characterized by healthy                            appearing mucosa. Biopsied erythematous and friable                            gastric puch. She has hx of gastric bypass listed                            as Roux-en Y but this has a Bilroth II appearance                            with afferent and effernt limbs                           - The examination was otherwise normal. Recommendation:           - Patient has a contact number available for                            emergencies.  The signs and symptoms of potential                            delayed complications were discussed with the                            patient. Return to normal activities tomorrow.                            Written discharge instructions were provided to the                            patient.                           - Resume previous diet.                           -  Continue present medications.                           - Await pathology results.                           - She will return in 1 month to repeat LFT's do                            GGT, 5 nucleotidase and repeat fecal calprotectin                            (I ordered today - has elevated alk phos and                            intermittent diarrhea w/ borderline fecal                            calprotectin)                           She has R lateral lower chest wall tenderness -                            musculoskeletal in origin I think - recommend OTC                            diclofenac  gel and heat to area                           Once path returns will try to adjust treatment                            further - CT unrevealing recently ? if there is a                            non-GI cause of nausea and vomiting - discussed in                            recovery and it tends to be post-prandial and                            associated with flusghing - no prior problems with                            dumping syndrome Lupita FORBES Commander, MD 02/17/2024 1:03:24 PM This report has been signed electronically.

## 2024-02-18 ENCOUNTER — Telehealth: Payer: Self-pay | Admitting: *Deleted

## 2024-02-18 NOTE — Telephone Encounter (Signed)
 Attempted post procedure follow up call.  No answer - LVM.

## 2024-02-19 LAB — SURGICAL PATHOLOGY

## 2024-02-22 ENCOUNTER — Ambulatory Visit: Payer: Self-pay | Admitting: Internal Medicine

## 2024-03-01 DIAGNOSIS — F3342 Major depressive disorder, recurrent, in full remission: Secondary | ICD-10-CM | POA: Diagnosis not present

## 2024-03-01 DIAGNOSIS — F5101 Primary insomnia: Secondary | ICD-10-CM | POA: Diagnosis not present

## 2024-03-07 ENCOUNTER — Encounter: Payer: Self-pay | Admitting: Family Medicine

## 2024-03-11 ENCOUNTER — Ambulatory Visit: Payer: BC Managed Care – PPO | Admitting: Family Medicine

## 2024-04-27 DIAGNOSIS — Z23 Encounter for immunization: Secondary | ICD-10-CM | POA: Diagnosis not present

## 2024-06-29 ENCOUNTER — Other Ambulatory Visit: Payer: Self-pay | Admitting: Family Medicine

## 2024-06-29 DIAGNOSIS — E1142 Type 2 diabetes mellitus with diabetic polyneuropathy: Secondary | ICD-10-CM

## 2024-06-29 DIAGNOSIS — I10 Essential (primary) hypertension: Secondary | ICD-10-CM

## 2024-07-13 ENCOUNTER — Encounter: Payer: Self-pay | Admitting: Family Medicine

## 2024-07-21 ENCOUNTER — Other Ambulatory Visit: Payer: Self-pay | Admitting: Family Medicine

## 2024-07-21 DIAGNOSIS — Z1231 Encounter for screening mammogram for malignant neoplasm of breast: Secondary | ICD-10-CM

## 2024-08-03 ENCOUNTER — Encounter: Payer: Self-pay | Admitting: Family Medicine

## 2024-08-03 ENCOUNTER — Ambulatory Visit: Admitting: Family Medicine

## 2024-08-03 VITALS — BP 144/90 | HR 95 | Temp 97.8°F | Ht 68.5 in | Wt 244.0 lb

## 2024-08-03 DIAGNOSIS — E538 Deficiency of other specified B group vitamins: Secondary | ICD-10-CM

## 2024-08-03 DIAGNOSIS — E1142 Type 2 diabetes mellitus with diabetic polyneuropathy: Secondary | ICD-10-CM | POA: Diagnosis not present

## 2024-08-03 DIAGNOSIS — Z23 Encounter for immunization: Secondary | ICD-10-CM

## 2024-08-03 DIAGNOSIS — Z0000 Encounter for general adult medical examination without abnormal findings: Secondary | ICD-10-CM | POA: Diagnosis not present

## 2024-08-03 LAB — MICROALBUMIN / CREATININE URINE RATIO
Creatinine,U: 110.9 mg/dL
Microalb Creat Ratio: 7.7 mg/g (ref 0.0–30.0)
Microalb, Ur: 0.9 mg/dL (ref 0.7–1.9)

## 2024-08-03 LAB — COMPREHENSIVE METABOLIC PANEL WITH GFR
ALT: 23 U/L (ref 3–35)
AST: 27 U/L (ref 5–37)
Albumin: 4.3 g/dL (ref 3.5–5.2)
Alkaline Phosphatase: 107 U/L (ref 39–117)
BUN: 11 mg/dL (ref 6–23)
CO2: 24 meq/L (ref 19–32)
Calcium: 9.3 mg/dL (ref 8.4–10.5)
Chloride: 102 meq/L (ref 96–112)
Creatinine, Ser: 0.82 mg/dL (ref 0.40–1.20)
GFR: 81.34 mL/min
Glucose, Bld: 104 mg/dL — ABNORMAL HIGH (ref 70–99)
Potassium: 4.2 meq/L (ref 3.5–5.1)
Sodium: 138 meq/L (ref 135–145)
Total Bilirubin: 0.5 mg/dL (ref 0.2–1.2)
Total Protein: 7.3 g/dL (ref 6.0–8.3)

## 2024-08-03 LAB — VITAMIN B12: Vitamin B-12: 207 pg/mL — ABNORMAL LOW (ref 211–911)

## 2024-08-03 LAB — LIPID PANEL
Cholesterol: 212 mg/dL — ABNORMAL HIGH (ref 28–200)
HDL: 92 mg/dL
LDL Cholesterol: 106 mg/dL — ABNORMAL HIGH (ref 10–99)
NonHDL: 119.97
Total CHOL/HDL Ratio: 2
Triglycerides: 71 mg/dL (ref 10.0–149.0)
VLDL: 14.2 mg/dL (ref 0.0–40.0)

## 2024-08-03 LAB — POCT GLYCOSYLATED HEMOGLOBIN (HGB A1C): Hemoglobin A1C: 6.5 % — AB (ref 4.0–5.6)

## 2024-08-03 MED ORDER — TIRZEPATIDE 2.5 MG/0.5ML ~~LOC~~ SOAJ: 2.5000 mg | SUBCUTANEOUS | 5 refills | Status: AC

## 2024-08-03 NOTE — Progress Notes (Signed)
 "  Complete physical exam  Patient: [name]   DOB: [birth year]   53 y.o. Female  MRN: [id number]  Subjective:    Chief Complaint  Patient presents with   Annual Exam    [name] is a 53 y.o. female who presents today for a complete physical exam. She reports consuming a general diet. Eating more green veggies, eats salads almost daily. Getting good sources of protein, eating more beans, eats minimal dairy, mostly cheese and yogurt. The patient does not participate in regular exercise at present. She generally feels well. She reports sleeping somewhat poorly, does have sleep aids to take at night. She does not have additional problems to discuss today.    Most recent fall risk assessment:    01/31/2023    4:21 PM  Fall Risk   Falls in the past year? 0  Number falls in past yr: 0  Injury with Fall? 0   Risk for fall due to : No Fall Risks  Follow up Falls evaluation completed     Data saved with a previous flowsheet row definition     Most recent depression screenings:    09/12/2023    1:35 PM 06/12/2022   11:24 AM  PHQ 2/9 Scores  PHQ - 2 Score 0 1  PHQ- 9 Score  4      Data saved with a previous flowsheet row definition    Vision:Within last year and Dental: No current dental problems, Receives regular dental care, and Last dental visit: [facility]  Patient Active Problem List   Diagnosis Date Noted   Chronic nausea 09/15/2023   Type 2 diabetes mellitus with diabetic polyneuropathy, without long-term current use of insulin  (HCC) 12/03/2021   Hypertension 12/03/2021   B12 deficiency 12/03/2021   Primary osteoarthritis of right knee 05/26/2018   Bilateral nipple discharge 09/19/2017   Primary localized osteoarthritis of left knee 07/22/2017   Primary osteoarthritis of left knee 07/22/2017   IDA (iron deficiency anemia) 05/20/2017   Effusion of left knee 05/17/2017   Degenerative arthritis of right knee 02/13/2017   Degenerative arthritis of  left knee 02/13/2017   Numbness and tingling of both lower extremities 10/18/2016   Genetic testing 10/18/2016   Family history of breast cancer    Multinodular goiter 04/24/2015   Mood disorder 04/12/2015   Peroneal mononeuropathy 08/22/2014   Left foot drop 07/21/2014   Menorrhagia with irregular cycle 04/29/2014   S/P gastric bypass 11/02/2013   Hyperlipidemia 10/08/2013   Primary localized osteoarthrosis, lower leg 07/06/2013   Obesity, BMI unknown 04/23/2013   IUD contraception - inserted summer 2013 04/09/2013      Patient Care Team: Ozell Heron HERO, MD as PCP - General (Family Medicine) Trixie File, MD as Consulting Physician (Internal Medicine) Cathlyn JAYSON Nikki Bobie FORBES, MD as Consulting Physician (Obstetrics and Gynecology)   Show/hide medication list[1]  Review of Systems  HENT:  Negative for hearing loss.   Eyes:  Negative for blurred vision.  Respiratory:  Negative for shortness of breath.   Cardiovascular:  Negative for chest pain.  Gastrointestinal: Negative.   Genitourinary: Negative.   Musculoskeletal:  Negative for back pain.  Neurological:  Negative for headaches.  Psychiatric/Behavioral:  Negative for depression.        Objective:     BP (!) 144/90   Pulse 95   Temp 97.8 F (36.6 C) (Oral)   Ht 5' 8.5 (1.74 m)   Wt 244 lb (110.7 kg)   LMP  10/11/2019   SpO2 98%   BMI 36.56 kg/m    Physical Exam Vitals reviewed.  Constitutional:      Appearance: Normal appearance. She is well-groomed. She is obese.  HENT:     Right Ear: Tympanic membrane and ear canal normal.     Left Ear: Tympanic membrane and ear canal normal.     Mouth/Throat:     Mouth: Mucous membranes are moist.     Pharynx: No posterior oropharyngeal erythema.  Eyes:     Conjunctiva/sclera: Conjunctivae normal.  Neck:     Thyroid : No thyromegaly.  Cardiovascular:     Rate and Rhythm: Normal rate and regular rhythm.     Pulses: Normal pulses.     Heart sounds: S1  normal and S2 normal.  Pulmonary:     Effort: Pulmonary effort is normal.     Breath sounds: Normal breath sounds and air entry.  Abdominal:     General: Abdomen is flat. Bowel sounds are normal.     Palpations: Abdomen is soft.  Musculoskeletal:     Right lower leg: No edema.     Left lower leg: No edema.  Lymphadenopathy:     Cervical: No cervical adenopathy.  Neurological:     Mental Status: She is alert and oriented to person, place, and time. Mental status is at baseline.     Gait: Gait is intact.  Psychiatric:        Mood and Affect: Mood and affect normal.        Speech: Speech normal.        Behavior: Behavior normal.        Judgment: Judgment normal.      Results for orders placed or performed in visit on 08/03/24  POC HgB A1c  Result Value Ref Range   Hemoglobin A1C 6.5 (A) 4.0 - 5.6 %   HbA1c POC (<> result, manual entry)     HbA1c, POC (prediabetic range)     HbA1c, POC (controlled diabetic range)         Assessment & Plan:    Routine Health Maintenance and Physical Exam  Immunization History  Administered Date(s) Administered   Hepatitis A, Adult 04/27/2024   Influenza Split 05/26/2012   Influenza, Mdck, Trivalent,PF 6+ MOS(egg free) 05/24/2024   Influenza, Quadrivalent, Recombinant, Inj, Pf 06/16/2021   Influenza, Seasonal, Injecte, Preservative Fre 09/28/2023   Influenza,inj,Quad PF,6+ Mos 04/09/2013, 04/12/2015, 05/27/2018, 06/12/2022   Influenza-Unspecified 05/09/2016, 05/13/2017, 09/13/2023, 05/19/2024   PFIZER(Purple Top)SARS-COV-2 Vaccination 11/05/2019, 11/26/2019, 09/05/2020   Pfizer Covid-19 Vaccine Bivalent Booster 58yrs & up 06/16/2021, 06/19/2021   Pfizer(Comirnaty)Fall Seasonal Vaccine 12 years and older 05/24/2024   Pneumococcal Polysaccharide-23 04/27/2013   Tdap 04/09/2013, 06/24/2022   Typhoid Inactivated 04/27/2024    Health Maintenance  Topic Date Due   Hepatitis B Vaccines 19-59 Average Risk (1 of 3 - 19+ 3-dose series) Never  done   Zoster Vaccines- Shingrix (1 of 2) Never done   Pneumococcal Vaccine: 50+ Years (2 of 2 - PCV) 04/27/2014   FOOT EXAM  09/28/2023   OPHTHALMOLOGY EXAM  05/18/2024   COVID-19 Vaccine (6 - Pfizer risk 2025-26 season) 11/22/2024   HEMOGLOBIN A1C  02/01/2025   Mammogram  05/01/2025   Cervical Cancer Screening (HPV/Pap Cotest)  03/20/2026   Colonoscopy  05/24/2031   DTaP/Tdap/Td (3 - Td or Tdap) 06/24/2032   Influenza Vaccine  Completed   Hepatitis C Screening  Completed   HIV Screening  Completed   HPV VACCINES  Aged Out  Meningococcal B Vaccine  Aged Out    Discussed health benefits of physical activity, and encouraged her to engage in regular exercise appropriate for her age and condition.  Type 2 diabetes mellitus with diabetic polyneuropathy, without long-term current use of insulin  (HCC) -     POCT glycosylated hemoglobin (Hb A1C) -     Collection capillary blood specimen -     Microalbumin / creatinine urine ratio; Future -     Comprehensive metabolic panel with GFR; Future -     Tirzepatide ; Inject 2.5 mg into the skin once a week.  Dispense: 2 mL; Refill: 5 -     Lipid panel; Future  Immunization due -     Varicella-zoster vaccine IM; Future  Routine general medical examination at a health care facility  B12 deficiency -     Vitamin B12; Future  General physical exam findings are normal today. I reviewed the patient's preventative testing, immunizations, and lifestyle habits. I made appropriate recommendations and placed orders for the appropriate tests and/or vaccinations. I counseled the patient on the CDC's recommendations for healthy exercise and diet. I counseled the patient on healthy sleep habits and stress management. Handouts to reinforce the counseling were given at the conclusion of the visit.    Return in 6 months (on 02/01/2025).     Heron CHRISTELLA Sharper, MD     [1]  Outpatient Medications Prior to Visit  Medication Sig   Acetaminophen  (TYLENOL   PO) Take 1 capsule by mouth every 8 (eight) hours.   Cyanocobalamin  (B-12) 1000 MCG SUBL Place 1,000 mcg under the tongue daily.   JARDIANCE  10 MG TABS tablet TAKE 1 TABLET(10 MG) BY MOUTH DAILY BEFORE BREAKFAST   losartan  (COZAAR ) 100 MG tablet TAKE 1 TABLET(100 MG) BY MOUTH DAILY   Multiple Vitamin (MULTIVITAMIN ADULT PO) Take by mouth.   nystatin  (MYCOSTATIN /NYSTOP ) powder Apply 1 application. topically 3 (three) times daily. Apply to affected area for up to 7 days   ondansetron  (ZOFRAN ) 4 MG tablet Take 1 tablet (4 mg total) by mouth every 8 (eight) hours as needed for nausea or vomiting.   pantoprazole  (PROTONIX ) 40 MG tablet Take 1 tablet (40 mg total) by mouth 2 (two) times daily before a meal for 14 days.   traZODone (DESYREL) 100 MG tablet Take 1 tablet by mouth at bedtime as needed.   No facility-administered medications prior to visit.   "

## 2024-08-03 NOTE — Patient Instructions (Signed)

## 2024-08-04 ENCOUNTER — Telehealth: Payer: Self-pay

## 2024-08-04 ENCOUNTER — Ambulatory Visit: Payer: Self-pay | Admitting: Family Medicine

## 2024-08-04 NOTE — Telephone Encounter (Signed)
 Pharmacy Patient Advocate Encounter   Received notification from Onbase that prior authorization for Mounjaro  2.5MG /0.5ML auto-injectors is required/requested.   Insurance verification completed.   The patient is insured through [employer].   Per test claim: PA required; PA submitted to above mentioned insurance via Latent Key/confirmation #/EOC [id number] Status is pending

## 2024-08-06 ENCOUNTER — Telehealth: Payer: Self-pay

## 2024-08-06 ENCOUNTER — Other Ambulatory Visit (HOSPITAL_COMMUNITY): Payer: Self-pay

## 2024-08-06 ENCOUNTER — Encounter: Payer: Self-pay | Admitting: Family

## 2024-08-06 NOTE — Progress Notes (Signed)
" ° °  08/06/2024  Patient ID: [name], female   DOB: 11-05-1970, 53 y.o.   MRN: [id number]  Pharmacy Quality Measure Review  This patient is appearing on a report for being at risk of failing the Controlling Blood Pressure measure this calendar year.   Last documented BP 144/90 on 08/03/24  Spoke with patient via telephone. She is not monitoring BP at home currently, plans to start this weekend. Also notified of Mounjaro  approval through insurance. Patient will send mychart message of updated BP readings and let us  know if started mounjaro , if she has any questions/concerns.  Jon VEAR Lindau, PharmD Clinical Pharmacist [phone]   "

## 2024-08-06 NOTE — Telephone Encounter (Signed)
 Pharmacy Patient Advocate Encounter  Received notification from [employer] that Prior Authorization for  Mounjaro  2.5MG /0.5ML auto-injectors  has been APPROVED from 07/05/25 to 08/04/25   PA #/Case ID/Reference #: [id number]

## 2024-08-10 NOTE — Telephone Encounter (Signed)
 Left a detailed message with the information below on the pharmacy voicemail at [facility].

## 2024-08-18 ENCOUNTER — Ambulatory Visit
Admission: RE | Admit: 2024-08-18 | Discharge: 2024-08-18 | Disposition: A | Discharge status: Home / Self Care | Source: Ambulatory Visit | Attending: Family Medicine | Admitting: Family Medicine

## 2024-08-18 DIAGNOSIS — Z1231 Encounter for screening mammogram for malignant neoplasm of breast: Secondary | ICD-10-CM

## 2024-08-20 ENCOUNTER — Ambulatory Visit: Payer: Self-pay | Admitting: Family Medicine
